# Patient Record
Sex: Female | Born: 1940
Health system: Southern US, Community
[De-identification: ages and names within clinical notes are randomized; demographics above are authoritative.]

## PROBLEM LIST (undated history)

## (undated) DIAGNOSIS — I209 Angina pectoris, unspecified: Secondary | ICD-10-CM

## (undated) DIAGNOSIS — C801 Malignant (primary) neoplasm, unspecified: Secondary | ICD-10-CM

## (undated) DIAGNOSIS — K436 Other and unspecified ventral hernia with obstruction, without gangrene: Secondary | ICD-10-CM

## (undated) DIAGNOSIS — B977 Papillomavirus as the cause of diseases classified elsewhere: Secondary | ICD-10-CM

## (undated) DIAGNOSIS — E785 Hyperlipidemia, unspecified: Secondary | ICD-10-CM

## (undated) DIAGNOSIS — M549 Dorsalgia, unspecified: Secondary | ICD-10-CM

## (undated) DIAGNOSIS — R7303 Prediabetes: Secondary | ICD-10-CM

## (undated) DIAGNOSIS — I10 Essential (primary) hypertension: Secondary | ICD-10-CM

## (undated) DIAGNOSIS — E119 Type 2 diabetes mellitus without complications: Secondary | ICD-10-CM

## (undated) DIAGNOSIS — IMO0001 Reserved for inherently not codable concepts without codable children: Secondary | ICD-10-CM

## (undated) DIAGNOSIS — H269 Unspecified cataract: Secondary | ICD-10-CM

## (undated) DIAGNOSIS — M25519 Pain in unspecified shoulder: Secondary | ICD-10-CM

## (undated) DIAGNOSIS — M8580 Other specified disorders of bone density and structure, unspecified site: Secondary | ICD-10-CM

## (undated) DIAGNOSIS — M1990 Unspecified osteoarthritis, unspecified site: Secondary | ICD-10-CM

## (undated) DIAGNOSIS — E669 Obesity, unspecified: Secondary | ICD-10-CM

## (undated) HISTORY — DX: Dorsalgia, unspecified: M54.9

## (undated) HISTORY — PX: EYE SURGERY: SHX253

## (undated) HISTORY — DX: Pain in unspecified shoulder: M25.519

## (undated) HISTORY — DX: Obesity, unspecified: E66.9

## (undated) HISTORY — PX: TONSILLECTOMY: SUR1361

## (undated) HISTORY — DX: Unspecified osteoarthritis, unspecified site: M19.90

## (undated) HISTORY — DX: Essential (primary) hypertension: I10

## (undated) HISTORY — PX: CATARACT EXTRACTION, BILATERAL: SHX1313

## (undated) HISTORY — DX: Other specified disorders of bone density and structure, unspecified site: M85.80

## (undated) HISTORY — DX: Hyperlipidemia, unspecified: E78.5

## (undated) HISTORY — PX: CORRECTION HAMMER TOE: SUR317

## (undated) HISTORY — PX: JOINT REPLACEMENT: SHX530

## (undated) HISTORY — DX: Type 2 diabetes mellitus without complications: E11.9

## (undated) HISTORY — DX: Papillomavirus as the cause of diseases classified elsewhere: B97.7

---

## 1994-07-26 DIAGNOSIS — B977 Papillomavirus as the cause of diseases classified elsewhere: Secondary | ICD-10-CM

## 1994-07-26 HISTORY — DX: Papillomavirus as the cause of diseases classified elsewhere: B97.7

## 2001-04-19 ENCOUNTER — Other Ambulatory Visit: Admission: RE | Admit: 2001-04-19 | Discharge: 2001-04-19 | Payer: Self-pay | Admitting: Family Medicine

## 2003-08-28 ENCOUNTER — Other Ambulatory Visit: Admission: RE | Admit: 2003-08-28 | Discharge: 2003-08-28 | Payer: Self-pay | Admitting: Family Medicine

## 2004-06-01 ENCOUNTER — Ambulatory Visit: Payer: Self-pay | Admitting: Unknown Physician Specialty

## 2004-06-01 HISTORY — PX: COLONOSCOPY: SHX174

## 2004-06-01 LAB — HM COLONOSCOPY: HM Colonoscopy: NORMAL

## 2004-08-27 ENCOUNTER — Ambulatory Visit: Payer: Self-pay | Admitting: Family Medicine

## 2004-10-13 ENCOUNTER — Other Ambulatory Visit: Admission: RE | Admit: 2004-10-13 | Discharge: 2004-10-13 | Payer: Self-pay | Admitting: Family Medicine

## 2004-10-13 ENCOUNTER — Ambulatory Visit: Payer: Self-pay | Admitting: Family Medicine

## 2004-12-09 ENCOUNTER — Ambulatory Visit: Payer: Self-pay | Admitting: Family Medicine

## 2005-03-16 ENCOUNTER — Ambulatory Visit: Payer: Self-pay | Admitting: Family Medicine

## 2005-04-01 ENCOUNTER — Ambulatory Visit: Payer: Self-pay | Admitting: Family Medicine

## 2005-05-13 ENCOUNTER — Ambulatory Visit: Payer: Self-pay | Admitting: Family Medicine

## 2005-10-01 ENCOUNTER — Ambulatory Visit: Payer: Self-pay | Admitting: Family Medicine

## 2005-11-18 ENCOUNTER — Other Ambulatory Visit: Admission: RE | Admit: 2005-11-18 | Discharge: 2005-11-18 | Payer: Self-pay | Admitting: Family Medicine

## 2005-11-18 ENCOUNTER — Encounter: Payer: Self-pay | Admitting: Family Medicine

## 2005-11-18 ENCOUNTER — Ambulatory Visit: Payer: Self-pay | Admitting: Family Medicine

## 2005-11-18 LAB — CONVERTED CEMR LAB: Pap Smear: NORMAL

## 2005-12-30 ENCOUNTER — Ambulatory Visit: Payer: Self-pay | Admitting: Family Medicine

## 2005-12-31 ENCOUNTER — Ambulatory Visit: Payer: Self-pay | Admitting: Family Medicine

## 2006-01-20 ENCOUNTER — Ambulatory Visit: Payer: Self-pay | Admitting: Family Medicine

## 2006-05-26 ENCOUNTER — Ambulatory Visit: Payer: Self-pay | Admitting: Family Medicine

## 2006-06-28 ENCOUNTER — Ambulatory Visit: Payer: Self-pay | Admitting: Family Medicine

## 2006-09-14 ENCOUNTER — Ambulatory Visit: Payer: Self-pay | Admitting: Family Medicine

## 2006-10-12 ENCOUNTER — Ambulatory Visit: Payer: Self-pay | Admitting: Family Medicine

## 2006-10-12 LAB — CONVERTED CEMR LAB
Cholesterol: 196 mg/dL (ref 0–200)
HDL: 42.3 mg/dL (ref >39.0)

## 2006-12-06 ENCOUNTER — Telehealth (INDEPENDENT_AMBULATORY_CARE_PROVIDER_SITE_OTHER): Payer: Self-pay | Admitting: *Deleted

## 2007-01-03 ENCOUNTER — Encounter: Payer: Self-pay | Admitting: Family Medicine

## 2007-01-03 DIAGNOSIS — N318 Other neuromuscular dysfunction of bladder: Secondary | ICD-10-CM

## 2007-01-03 DIAGNOSIS — E785 Hyperlipidemia, unspecified: Secondary | ICD-10-CM | POA: Insufficient documentation

## 2007-01-03 DIAGNOSIS — J301 Allergic rhinitis due to pollen: Secondary | ICD-10-CM

## 2007-01-03 DIAGNOSIS — J45909 Unspecified asthma, uncomplicated: Secondary | ICD-10-CM | POA: Insufficient documentation

## 2007-01-03 DIAGNOSIS — E669 Obesity, unspecified: Secondary | ICD-10-CM | POA: Insufficient documentation

## 2007-01-03 DIAGNOSIS — Z6835 Body mass index (BMI) 35.0-35.9, adult: Secondary | ICD-10-CM

## 2007-01-06 ENCOUNTER — Ambulatory Visit: Payer: Self-pay | Admitting: Family Medicine

## 2007-01-20 ENCOUNTER — Encounter: Payer: Self-pay | Admitting: Family Medicine

## 2007-03-07 ENCOUNTER — Ambulatory Visit: Payer: Self-pay | Admitting: Family Medicine

## 2007-03-07 DIAGNOSIS — M8580 Other specified disorders of bone density and structure, unspecified site: Secondary | ICD-10-CM

## 2007-03-10 ENCOUNTER — Ambulatory Visit: Payer: Self-pay | Admitting: Family Medicine

## 2007-03-23 ENCOUNTER — Ambulatory Visit: Payer: Self-pay | Admitting: Family Medicine

## 2007-03-23 ENCOUNTER — Encounter: Payer: Self-pay | Admitting: Family Medicine

## 2007-03-28 ENCOUNTER — Encounter (INDEPENDENT_AMBULATORY_CARE_PROVIDER_SITE_OTHER): Payer: Self-pay | Admitting: *Deleted

## 2007-04-27 ENCOUNTER — Ambulatory Visit: Payer: Self-pay | Admitting: Family Medicine

## 2007-05-02 LAB — CONVERTED CEMR LAB
AST: 19 units/L (ref 0–37)
CO2: 29 meq/L (ref 19–32)
Calcium: 9 mg/dL (ref 8.4–10.5)
Cholesterol: 184 mg/dL (ref 0–200)
Glucose, Bld: 103 mg/dL — ABNORMAL HIGH (ref 70–99)
HDL: 35.3 mg/dL — ABNORMAL LOW (ref >39.0)
Phosphorus: 3.7 mg/dL (ref 2.3–4.6)
Potassium: 4.5 meq/L (ref 3.5–5.1)
Total CHOL/HDL Ratio: 5.2
Triglycerides: 207 mg/dL (ref 0–149)

## 2007-06-13 ENCOUNTER — Ambulatory Visit: Payer: Self-pay | Admitting: Family Medicine

## 2007-06-13 DIAGNOSIS — M25549 Pain in joints of unspecified hand: Secondary | ICD-10-CM

## 2007-06-14 LAB — CONVERTED CEMR LAB
Basophils Absolute: 0 10*3/uL (ref 0.0–0.1)
Basophils Relative: 0.7 % (ref 0.0–1.0)
HCT: 41.9 % (ref 36.0–46.0)
Hemoglobin: 14.6 g/dL (ref 12.0–15.0)
MCHC: 34.9 g/dL (ref 30.0–36.0)
Monocytes Absolute: 0.6 10*3/uL (ref 0.2–0.7)
Neutrophils Relative %: 51.8 % (ref 43.0–77.0)
RDW: 12.2 % (ref 11.5–14.6)

## 2007-07-31 ENCOUNTER — Ambulatory Visit: Payer: Self-pay | Admitting: Internal Medicine

## 2007-08-01 ENCOUNTER — Encounter: Payer: Self-pay | Admitting: Family Medicine

## 2007-08-28 ENCOUNTER — Encounter: Payer: Self-pay | Admitting: Family Medicine

## 2007-10-02 ENCOUNTER — Encounter: Payer: Self-pay | Admitting: Family Medicine

## 2007-12-05 ENCOUNTER — Encounter: Payer: Self-pay | Admitting: Family Medicine

## 2007-12-13 ENCOUNTER — Encounter (INDEPENDENT_AMBULATORY_CARE_PROVIDER_SITE_OTHER): Payer: Self-pay | Admitting: *Deleted

## 2008-03-05 ENCOUNTER — Ambulatory Visit: Payer: Self-pay | Admitting: Family Medicine

## 2008-03-05 ENCOUNTER — Telehealth: Payer: Self-pay | Admitting: Family Medicine

## 2008-03-19 ENCOUNTER — Telehealth: Payer: Self-pay | Admitting: Family Medicine

## 2008-03-28 ENCOUNTER — Ambulatory Visit: Payer: Self-pay | Admitting: Family Medicine

## 2008-03-28 ENCOUNTER — Encounter: Payer: Self-pay | Admitting: Family Medicine

## 2008-04-16 ENCOUNTER — Telehealth: Payer: Self-pay | Admitting: Family Medicine

## 2008-05-06 ENCOUNTER — Ambulatory Visit: Payer: Self-pay | Admitting: Family Medicine

## 2008-05-07 ENCOUNTER — Encounter: Payer: Self-pay | Admitting: Family Medicine

## 2008-05-07 LAB — CONVERTED CEMR LAB
ALT: 15 units/L (ref 0–35)
Basophils Absolute: 0 10*3/uL (ref 0.0–0.1)
CO2: 30 meq/L (ref 19–32)
Chloride: 110 meq/L (ref 96–112)
Cholesterol: 186 mg/dL (ref 0–200)
GFR calc Af Amer: 92 mL/min
GFR calc non Af Amer: 76 mL/min
HDL: 39.4 mg/dL (ref >39.0)
LDL Cholesterol: 116 mg/dL — ABNORMAL HIGH (ref 0–99)
Lymphocytes Relative: 35.6 % (ref 12.0–46.0)
MCHC: 33.9 g/dL (ref 30.0–36.0)
Neutrophils Relative %: 50.2 % (ref 43.0–77.0)
Phosphorus: 3.3 mg/dL (ref 2.3–4.6)
Platelets: 236 10*3/uL (ref 150–400)
RDW: 12.7 % (ref 11.5–14.6)
Sodium: 145 meq/L (ref 135–145)
TSH: 1.95 microintl units/mL (ref 0.35–5.50)
Triglycerides: 152 mg/dL — ABNORMAL HIGH (ref 0–149)
VLDL: 30 mg/dL (ref 0–40)

## 2008-05-13 ENCOUNTER — Ambulatory Visit: Payer: Self-pay | Admitting: Family Medicine

## 2008-05-13 DIAGNOSIS — R030 Elevated blood-pressure reading, without diagnosis of hypertension: Secondary | ICD-10-CM

## 2008-05-13 DIAGNOSIS — M069 Rheumatoid arthritis, unspecified: Secondary | ICD-10-CM

## 2008-05-13 DIAGNOSIS — M059 Rheumatoid arthritis with rheumatoid factor, unspecified: Secondary | ICD-10-CM | POA: Insufficient documentation

## 2008-05-23 ENCOUNTER — Ambulatory Visit: Payer: Self-pay | Admitting: Family Medicine

## 2008-06-25 LAB — HM DEXA SCAN

## 2008-07-09 ENCOUNTER — Encounter: Payer: Self-pay | Admitting: Family Medicine

## 2008-07-17 ENCOUNTER — Ambulatory Visit: Payer: Self-pay | Admitting: Family Medicine

## 2008-08-05 ENCOUNTER — Ambulatory Visit: Payer: Self-pay | Admitting: Family Medicine

## 2008-09-04 ENCOUNTER — Ambulatory Visit: Payer: Self-pay | Admitting: Family Medicine

## 2008-09-05 ENCOUNTER — Encounter: Payer: Self-pay | Admitting: Family Medicine

## 2008-09-24 ENCOUNTER — Encounter: Payer: Self-pay | Admitting: Family Medicine

## 2008-10-29 ENCOUNTER — Telehealth (INDEPENDENT_AMBULATORY_CARE_PROVIDER_SITE_OTHER): Payer: Self-pay | Admitting: *Deleted

## 2008-11-01 ENCOUNTER — Encounter: Payer: Self-pay | Admitting: Family Medicine

## 2008-11-11 ENCOUNTER — Ambulatory Visit: Payer: Self-pay | Admitting: Family Medicine

## 2008-11-12 LAB — CONVERTED CEMR LAB
ALT: 12 units/L (ref 0–35)
HDL: 37 mg/dL — ABNORMAL LOW (ref >39.00)
LDL Cholesterol: 100 mg/dL — ABNORMAL HIGH (ref 0–99)
Total CHOL/HDL Ratio: 5

## 2008-12-12 ENCOUNTER — Ambulatory Visit: Payer: Self-pay | Admitting: Family Medicine

## 2008-12-16 ENCOUNTER — Telehealth: Payer: Self-pay | Admitting: Family Medicine

## 2009-03-07 ENCOUNTER — Telehealth: Payer: Self-pay | Admitting: Family Medicine

## 2009-03-11 ENCOUNTER — Ambulatory Visit: Payer: Self-pay | Admitting: Family Medicine

## 2009-05-06 ENCOUNTER — Encounter: Payer: Self-pay | Admitting: Family Medicine

## 2009-05-12 ENCOUNTER — Ambulatory Visit: Payer: Self-pay | Admitting: Family Medicine

## 2009-05-13 LAB — CONVERTED CEMR LAB
AST: 19 units/L (ref 0–37)
Albumin: 3.4 g/dL — ABNORMAL LOW (ref 3.5–5.2)
Creatinine, Ser: 0.9 mg/dL (ref 0.4–1.2)
Glucose, Bld: 114 mg/dL — ABNORMAL HIGH (ref 70–99)
LDL Cholesterol: 109 mg/dL — ABNORMAL HIGH (ref 0–99)
Phosphorus: 3.3 mg/dL (ref 2.3–4.6)
Potassium: 4.5 meq/L (ref 3.5–5.1)
Sodium: 145 meq/L (ref 135–145)
Total CHOL/HDL Ratio: 5
Triglycerides: 188 mg/dL — ABNORMAL HIGH (ref 0.0–149.0)

## 2009-05-14 ENCOUNTER — Other Ambulatory Visit: Admission: RE | Admit: 2009-05-14 | Discharge: 2009-05-14 | Payer: Self-pay | Admitting: Family Medicine

## 2009-05-14 ENCOUNTER — Ambulatory Visit: Payer: Self-pay | Admitting: Family Medicine

## 2009-05-14 ENCOUNTER — Encounter: Payer: Self-pay | Admitting: Family Medicine

## 2009-05-14 DIAGNOSIS — R7303 Prediabetes: Secondary | ICD-10-CM | POA: Insufficient documentation

## 2009-05-14 LAB — CONVERTED CEMR LAB
Cholesterol, target level: 200 mg/dL
LDL Goal: 130 mg/dL

## 2009-05-20 ENCOUNTER — Encounter (INDEPENDENT_AMBULATORY_CARE_PROVIDER_SITE_OTHER): Payer: Self-pay | Admitting: *Deleted

## 2009-06-11 ENCOUNTER — Ambulatory Visit: Payer: Self-pay | Admitting: Family Medicine

## 2009-08-12 ENCOUNTER — Encounter (INDEPENDENT_AMBULATORY_CARE_PROVIDER_SITE_OTHER): Payer: Self-pay | Admitting: *Deleted

## 2009-08-13 ENCOUNTER — Ambulatory Visit: Payer: Self-pay | Admitting: Family Medicine

## 2009-08-14 LAB — CONVERTED CEMR LAB: Hgb A1c MFr Bld: 5.7 % (ref 4.6–6.5)

## 2009-12-31 ENCOUNTER — Encounter: Payer: Self-pay | Admitting: Family Medicine

## 2010-05-05 ENCOUNTER — Ambulatory Visit: Payer: Self-pay | Admitting: Family Medicine

## 2010-05-29 ENCOUNTER — Telehealth (INDEPENDENT_AMBULATORY_CARE_PROVIDER_SITE_OTHER): Payer: Self-pay | Admitting: *Deleted

## 2010-06-01 ENCOUNTER — Ambulatory Visit: Payer: Self-pay | Admitting: Family Medicine

## 2010-06-01 LAB — CONVERTED CEMR LAB
ALT: 16 units/L (ref 0–35)
AST: 19 units/L (ref 0–37)
Basophils Absolute: 0.1 10*3/uL (ref 0.0–0.1)
CO2: 29 meq/L (ref 19–32)
Chloride: 109 meq/L (ref 96–112)
Eosinophils Relative: 1.9 % (ref 0.0–5.0)
GFR calc non Af Amer: 69.26 mL/min (ref >60)
Hemoglobin: 14.5 g/dL (ref 12.0–15.0)
Lymphocytes Relative: 34.7 % (ref 12.0–46.0)
Monocytes Relative: 9.1 % (ref 3.0–12.0)
Neutro Abs: 2.8 10*3/uL (ref 1.4–7.7)
Phosphorus: 3.4 mg/dL (ref 2.3–4.6)
Potassium: 4.8 meq/L (ref 3.5–5.1)
RBC: 4.43 M/uL (ref 3.87–5.11)
RDW: 13.4 % (ref 11.5–14.6)
Sodium: 143 meq/L (ref 135–145)
WBC: 5.2 10*3/uL (ref 4.5–10.5)

## 2010-06-02 ENCOUNTER — Encounter (INDEPENDENT_AMBULATORY_CARE_PROVIDER_SITE_OTHER): Payer: Self-pay | Admitting: *Deleted

## 2010-06-02 LAB — CONVERTED CEMR LAB: Vit D, 25-Hydroxy: 45 ng/mL (ref 30–89)

## 2010-06-03 ENCOUNTER — Ambulatory Visit: Payer: Self-pay | Admitting: Family Medicine

## 2010-06-03 DIAGNOSIS — M25569 Pain in unspecified knee: Secondary | ICD-10-CM | POA: Insufficient documentation

## 2010-06-30 ENCOUNTER — Encounter: Payer: Self-pay | Admitting: Family Medicine

## 2010-07-03 ENCOUNTER — Ambulatory Visit: Payer: Self-pay | Admitting: Internal Medicine

## 2010-07-08 ENCOUNTER — Encounter: Payer: Self-pay | Admitting: Family Medicine

## 2010-07-08 ENCOUNTER — Ambulatory Visit: Payer: Self-pay | Admitting: Family Medicine

## 2010-07-09 ENCOUNTER — Encounter: Payer: Self-pay | Admitting: Family Medicine

## 2010-07-14 ENCOUNTER — Ambulatory Visit: Payer: Self-pay | Admitting: Internal Medicine

## 2010-07-26 LAB — HM DEXA SCAN

## 2010-07-31 ENCOUNTER — Encounter: Payer: Self-pay | Admitting: Family Medicine

## 2010-08-25 NOTE — Letter (Signed)
Summary: Rheumatology/[facility]  Rheumatology/[facility]   Imported By: Lester Mulford 01/10/2010 10:26:54  _____________________________________________________________________  External Attachment:    Type:   Image     Comment:   External Document

## 2010-08-25 NOTE — Letter (Signed)
Summary: [place] No Show Letter  [place] [facility]  [address] [place], [place] [address]   Phone: [phone]  Fax: [phone]    08/12/2009 MRN: [id number]  Cheryl Joseph [address] [place], [place]  [address]   Dear [name],   Our records indicate that you missed your scheduled appointment with ________Lab_____________ on ____1/18/11________.  Please contact this office to reschedule your appointment as soon as possible.  It is important that you keep your scheduled appointments with your physician, so we can provide you the best care possible.  Please be advised that there may be a charge for "no show" appointments.    Sincerely,    at [facility]

## 2010-08-25 NOTE — Miscellaneous (Signed)
Summary: med list update  Clinical Lists Changes  Medications: Changed medication from OXYTROL  PTTW (OXYBUTYNIN PTTW) apply one patch topically twice a week to OXYTROL 3.9 MG/24HR PTTW (OXYBUTYNIN) apply one patch topically twice a week     Prior Medications: SINGULAIR 10 MG TABS (MONTELUKAST SODIUM) take one by mouth dialy CENTRUM SILVER  TABS (MULTIPLE VITAMINS-MINERALS) take one by mouth daily CALTRATE 600  TABS (CALCIUM CARBONATE TABS) take by mouth as directed CLARITIN 10 MG TABS (LORATADINE) take by mouth as directed prn OXYTROL 3.9 MG/24HR PTTW (OXYBUTYNIN) apply one patch topically twice a week PROVENTIL 90 MCG/ACT AERS (ALBUTEROL) use as directed 2 puffs up to every 4 hours as needed and also before exercise ZOCOR 40 MG TABS (SIMVASTATIN) take one and one half by mouth daily MECLIZINE HCL 25 MG TABS (MECLIZINE HCL) Take 1/2 to 1 tablet by mouth four times a day as needed FISH OIL CAPS () daily Current Allergies: * DETROL * VYTORIN

## 2010-08-25 NOTE — Assessment & Plan Note (Signed)
Summary: shingles?/bir   Vital Signs:  Patient Profile:   70 Years Old Female Height:     62.5 inches Weight:      177 pounds Temp:     97.8 degrees F oral Pulse rate:   64 / minute Pulse rhythm:   regular BP sitting:   124 / 78  (right arm) Cuff size:   large  Vitals Entered By: Lowella Petties (March 10, 2007 4:27 PM)               Chief Complaint:  Rash right abdomen.  History of Present Illness: started ? rash on R abd (like a streak)- does not hurt/jusst itches- started on wed no fever, chills or other symptoms has had poison oak on arms which is getting better   Current Allergies: * DETROL * VYTORIN     Review of Systems      See HPI  General      Denies chills, fatigue, and fever.  Eyes      Denies eye irritation.  ENT      Denies nasal congestion and sore throat.  CV      Denies chest pain or discomfort.  Resp      Denies cough, shortness of breath, and wheezing.  MS      Denies joint pain.  Derm      Complains of rash.      Denies insect bite(s).  Allergy      Complains of seasonal allergies.   Physical Exam  General:     overwt but well appearing Mouth:     pharynx pink and moist.   Neck:     No deformities, masses, or tenderness noted.no cervical lymphadenopathy.   Lungs:     Normal respiratory effort, chest expands symmetrically. Lungs are clear to auscultation, no crackles or wheezes. Abdomen:     Bowel sounds positive,abdomen soft and non-tender without masses, organomegaly or hernias noted. Skin:     rash on R side of abdomen- in circuferential pattern with sharply demarcated line of erythema no papules or vesicles noted and no satellite lesions  generally dry skin on back Inguinal Nodes:     No significant adenopathy Psych:     nl affect    Impression & Recommendations:  Problem # 1:  FUNGAL DERMATITIS (ICD-111.9) rash resembles fungal dermatitis- sharp demarcation may be from skin fold no vesicles or papules  to indicate zoster (and no pain)- but pt inst to call if any of those develop will try nystatin cream and update Her updated medication list for this problem includes:    Nystatin 100000 Unit/gm Crea (Nystatin) .Marland Kitchen... Apply to affected area bid   Complete Medication List: 1)  Singulair 10 Mg Tabs (Montelukast sodium) .... Take one by mouth dialy 2)  Centrum Silver Tabs (Multiple vitamins-minerals) .... Take one by mouth daily 3)  Caltrate 600 Tabs (Calcium carbonate tabs) .... Take by mouth as directed 4)  Aspirin 81 Mg Tbec (Aspirin) .... Take by mouth as directed 5)  Claritin 10 Mg Tabs (Loratadine) .... Take by mouth as directed prn 6)  Oxytrol Pttw (Oxybutynin pttw) .... Use as directed 7)  Proventil 90 Mcg/act Aers (Albuterol) .... Use as directed prn 8)  Zocor 40 Mg Tabs (Simvastatin) .... Take one and one half by mouth daily 9)  Meclizine Hcl 25 Mg Tabs (Meclizine hcl) .... Take 1/2 to 1 tablet by mouth four times a day 10)  Nystatin 100000 Unit/gm Crea (Nystatin) .... Apply to  affected area bid   Patient Instructions: 1)  use nystatin cream twice daily- and let me know if not improved in a week or so 2)  keep area as clean and dry as possible 3)  cornstarch is good to help keep skin dry 4)  if pain or blisters develop, let me know    Prescriptions: NYSTATIN 100000 UNIT/GM  CREA (NYSTATIN) apply to affected area bid  #1 med tube x 1   Entered and Authorized by:   Judith Part MD   Signed by:   Judith Part MD on 03/10/2007   Method used:   Print then Give to Patient   RxID:   [id number]

## 2010-08-25 NOTE — Progress Notes (Signed)
----   Converted from flag ---- ---- 05/28/2010 5:14 PM, Colon Flattery Tower MD wrote: please check lipid/ast/alt/renal/ tsh/ cbc with diff and vit D level  733.0, 272, 401.1 thanks   ---- 05/28/2010 7:32 AM, Liane Comber CMA (AAMA) wrote: Lab orders please! Good Morning! This pt is scheduled for cpx labs Monday, which labs to draw and dx codes to use? Thanks Tasha ------------------------------

## 2010-08-25 NOTE — Assessment & Plan Note (Signed)
Summary: CPX/CLE   Vital Signs:  Patient profile:   70 year old female Height:      63 inches Weight:      195.50 pounds BMI:     34.76 Temp:     97.9 degrees F oral Pulse rate:   64 / minute Pulse rhythm:   regular BP sitting:   128 / 80  (left arm) Cuff size:   large  Vitals Entered By: Lewanda Rife LPN (June 03, 2010 10:01 AM) CC: check up / multiple chronic issues    History of Present Illness: here for check up of multiple chronic medical problems  feels pretty good overall   did something to her knee 6 weeks ago is feeling better  is using icy hot which helps  no particular trauma pain is lateral -- and ok walking / worse at night and when still is a little swollen  hardest to get in and out of the car    wt is up 2 lb pretty stable at home -- is trying hard to loose weight  does a 2 mile walking dvd -- not since she hurt her knee  is eating healthy - low sugar and low fat   is interested in wt watchers again  too much snacking   128/80 bp -- good   last pap nl 10/10-- last 3 have been normal  no symptoms or problems  remote hx of hpv / colp in 96   lipids --up a bit in trig 231- good LDL 110  osteopenia - dexa 09 dec -- nl D level is good  is taking calcium - slacked off on D  ca and D  mam 9/09 self exam   glucose 108 last aic under 6  Td 2010 flu up to date ptx 06 up to date  had shingles shot also in 09     Allergies: 1)  * Detrol 2)  * Vytorin  Past History:  Past Medical History: Last updated: 08/05/2008 1996 HPV with colposcopy (all neg paps since) Hyperlipidemia Asthma (symptom is cough) osteopenia obesity RA- hands (seronegative) back/ shoulder pain  rheum-- Dr Gavin Potters chiropractor-- Dr Jonnie Finner  Past Surgical History: Last updated: 07/13/2008 Tonsillectomy Spirometry- normal (01/2001) Dexa- (2001)     improved (2004) Colonoscopy- diverticulosis, hemorrhoids (05/2004) (12/09) dexa - worse osteopenia   Family  History: Last updated: 07/17/2008 Father: heart problems, HTN, died from lung cancer- smoker Mother: DM, elevated chol, heart problems, HTN, obesity Siblings:  brother DM brother alcohol mother and sisters had kyphosis- ? if could have had OP  Social History: Last updated: 05/13/2008 Marital Status: widowed Children:3 daughters, 1 with DM Works at  [employer] Never Smoked no alcohol   Risk Factors: Smoking Status: never (07/31/2007)  Review of Systems General:  Denies fatigue, loss of appetite, and malaise. Eyes:  Denies blurring and eye irritation. CV:  Denies chest pain or discomfort, lightheadness, and palpitations. Resp:  Denies cough, shortness of breath, and wheezing. GI:  Denies abdominal pain, change in bowel habits, and indigestion. GU:  Denies dysuria and urinary frequency. MS:  Complains of joint pain; denies joint redness, joint swelling, and muscle weakness. Derm:  Denies itching, lesion(s), poor wound healing, and rash. Neuro:  Denies numbness and tingling. Psych:  Denies anxiety and depression. Endo:  Denies excessive thirst and excessive urination. Heme:  Denies abnormal bruising and bleeding.  Physical Exam  General:  overweight but generally well appearing  Head:  normocephalic, atraumatic, and no  abnormalities observed.   Eyes:  vision grossly intact, pupils equal, pupils round, and pupils reactive to light.  no conjunctival pallor, injection or icterus  Ears:  R ear normal and L ear normal.   Nose:  no nasal discharge.   Mouth:  pharynx pink and moist.   Neck:  supple with full rom and no masses or thyromegally, no JVD or carotid bruit  Chest Wall:  No deformities, masses, or tenderness noted. Breasts:  No mass, nodules, thickening, tenderness, bulging, retraction, inflamation, nipple discharge or skin changes noted.   Lungs:  Normal respiratory effort, chest expands symmetrically. Lungs are clear to auscultation, no crackles or  wheezes. Heart:  Normal rate and regular rhythm. S1 and S2 normal without gallop, murmur, click, rub or other extra sounds. Abdomen:  Bowel sounds positive,abdomen soft and non-tender without masses, organomegaly or hernias noted. no renal bruits  Msk:  No deformity or scoliosis noted of thoracic or lumbar spine.  poor rom R knee with some lateral joint line tenderness  Pulses:  R and L carotid,radial,femoral,dorsalis pedis and posterior tibial pulses are full and equal bilaterally Extremities:  No clubbing, cyanosis, edema, or deformity noted with normal full range of motion of all joints.   Neurologic:  sensation intact to light touch, gait normal, and DTRs symmetrical and normal.   Skin:  Intact without suspicious lesions or rashes Cervical Nodes:  No lymphadenopathy noted Axillary Nodes:  No palpable lymphadenopathy Inguinal Nodes:  No significant adenopathy Psych:  normal affect, talkative and pleasant    Impression & Recommendations:  Problem # 1:  HYPERGLYCEMIA, FASTING (ICD-790.29) Assessment Unchanged  disc healthy diet (low simple sugar/ choose complex carbs/ low sat fat) diet and exercise in detail  will continue to follow   Labs Reviewed: Creat: 0.9 (06/01/2010)     Orders: Prescription Created Electronically [phone])  Problem # 2:  HYPERLIPIDEMIA (ICD-272.4) Assessment: Deteriorated  trig up - so disc low fat and low sugar diet  exercise when able  refil zocor  rev labs in detail with pt  Her updated medication list for this problem includes:    Zocor 40 Mg Tabs (Simvastatin) .Marland Kitchen... Take one and one half by mouth daily  Labs Reviewed: SGOT: 19 (06/01/2010)   SGPT: 16 (06/01/2010)  Lipid Goals: Chol Goal: 200 (05/14/2009)   HDL Goal: 40 (05/14/2009)   LDL Goal: 130 (05/14/2009)   TG Goal: 150 (05/14/2009)  Prior 10 Yr Risk Heart Disease: 17 % (05/14/2009)   HDL:39.50 (06/01/2010), 33.00 (05/12/2009)  LDL:109 (05/12/2009), 100 (11/11/2008)  Chol:185 (06/01/2010),  180 (05/12/2009)  Trig:231.0 (06/01/2010), 188.0 (05/12/2009)  Problem # 3:  OTHER SCREENING MAMMOGRAM (ICD-V76.12) Assessment: Comment Only annual mammogram scheduled adv pt to continue regular self breast exams non remarkable breast exam today  Orders: Radiology Referral (Radiology)  Problem # 4:  OSTEOPENIA (ICD-733.90) rev ca and D D level ok sched dexa in 2012 Her updated medication list for this problem includes:    Caltrate 600 Tabs (Calcium carbonate tabs) .Marland Kitchen... Take by mouth as directed  Orders: Radiology Referral (Radiology) Prescription Created Electronically [phone])  Problem # 5:  KNEE PAIN, RIGHT (ICD-719.46) Assessment: New  lateral with sweling ? injury ref to Dr Patsy Lager  Orders: Prescription Created Electronically [phone])  Complete Medication List: 1)  Singulair 10 Mg Tabs (Montelukast sodium) .... Take one by mouth dialy 2)  Centrum Silver Tabs (Multiple vitamins-minerals) .... Take one by mouth daily 3)  Caltrate 600 Tabs (Calcium carbonate tabs) .... Take by mouth as directed  4)  Claritin 10 Mg Tabs (Loratadine) .... Take by mouth as directed as needed 5)  Oxytrol 3.9 Mg/24hr Pttw (Oxybutynin) .... Apply one patch topically twice a week 6)  Proair Hfa 108 (90 Base) Mcg/act Aers (Albuterol sulfate) .... 2 puffs up to every 4 hours as needed 7)  Zocor 40 Mg Tabs (Simvastatin) .... Take one and one half by mouth daily 8)  Meclizine Hcl 25 Mg Tabs (Meclizine hcl) .... Take 1/2 to 1 tablet by mouth four times a day as needed 9)  Fish Oil Caps  .... Two capsules by Galen Manila daily  Patient Instructions: 1)  the current recommendation for calcium intake is 1200-1500 mg daily with 1000 IU of vitamin D  2)  we will schedule mammogram at check out and bone density  3)  since oxytrol patch is no longer affordible -- call your insurance to check on price of enablex and vesicare (they are newer oral medicines with less side effects )  4)  we will make appt with Dr  Patsy Lager for knee pain  Prescriptions: ZOCOR 40 MG TABS (SIMVASTATIN) take one and one half by mouth daily  #30 x 11   Entered and Authorized by:   Judith Part MD   Signed by:   Judith Part MD on 06/03/2010   Method used:   Electronically to        [facility] [phone]* (retail)       [address] [place], [place]  [address]       Ph: [phone]       Fax: [phone]   RxID:   [id number] PROAIR HFA 108 (90 BASE) MCG/ACT AERS (ALBUTEROL SULFATE) 2 puffs up to every 4 hours as needed  #1 mdi x 11   Entered and Authorized by:   Judith Part MD   Signed by:   Judith Part MD on 06/03/2010   Method used:   Electronically to        [facility] [phone]* (retail)       [address] [place], [place]  [address]       Ph: [phone]       Fax: [phone]   RxID:   [id number] SINGULAIR 10 MG TABS (MONTELUKAST SODIUM) take one by mouth dialy  #30 x 11   Entered and Authorized by:   Judith Part MD   Signed by:   Judith Part MD on 06/03/2010   Method used:   Electronically to        [facility] [phone]* (retail)       [address] [place], [place]  [address]       Ph: [phone]       Fax: [phone]   RxID:   [id number]    Orders Added: 1)  Radiology Referral [Radiology] 2)  Radiology Referral [Radiology] 3)  Prescription Created Electronically [G8553] 4)  Est. Patient Level IV [62694]    Current Allergies (reviewed today): * DETROL * VYTORIN

## 2010-08-25 NOTE — Assessment & Plan Note (Signed)
Summary: CONGESTION/DLO   Vital Signs:  Patient profile:   70 year old female Height:      63 inches Weight:      191.25 pounds BMI:     34.00 Temp:     97.9 degrees F oral Pulse rate:   66 / minute Pulse rhythm:   regular BP sitting:   130 / 90  (left arm) Cuff size:   large  Vitals Entered By: Melody Comas (July 03, 2010 2:50 PM) CC: cough, congestion    History of Present Illness: CC: congestion  4d h/o congestion and cough.  Took mucous relief q4 hours which caused diarrhea.  stopped yesterday and feeling worse congestion, "deeper".  + mouth extremely dry.  + RN today.  + head congestion/tightness.  Dry cough.  + sinus pressure.  No fevers/chills.  No abd pain, n/v, rashes, myalgias/arthralgias.  No ST, sneezing.  Went back to work today.  Today BP a bit high.  + friend with cold.  No smokers at home.  + h/o asthma, doesn't bother her.  Takes singulair daily and rarely albuterol.  pt states very sensitive to meds  requests change of oxytrol to vesicare for urinary incontinence.  Current Medications (verified): 1)  Singulair 10 Mg Tabs (Montelukast Sodium) .... Take One By Mouth Dialy 2)  Centrum Silver  Tabs (Multiple Vitamins-Minerals) .... Take One By Mouth Daily 3)  Caltrate 600  Tabs (Calcium Carbonate Tabs) .... Take By Mouth As Directed 4)  Claritin 10 Mg Tabs (Loratadine) .... Take By Mouth As Directed As Needed 5)  Oxytrol 3.9 Mg/24hr Pttw (Oxybutynin) .... Apply One Patch Topically Twice A Week 6)  Proair Hfa 108 (90 Base) Mcg/act Aers (Albuterol Sulfate) .... 2 Puffs Up To Every 4 Hours As Needed 7)  Zocor 40 Mg Tabs (Simvastatin) .... Take One and One Half By Mouth Daily 8)  Meclizine Hcl 25 Mg Tabs (Meclizine Hcl) .... Take 1/2 To 1 Tablet By Mouth Four Times A Day As Needed 9)  Fish Oil Caps .... Two Capsules By Galen Manila Daily  Allergies: 1)  * Detrol 2)  * Vytorin  Past History:  Past Medical History: Last updated: 08/05/2008 1996 HPV with  colposcopy (all neg paps since) Hyperlipidemia Asthma (symptom is cough) osteopenia obesity RA- hands (seronegative) back/ shoulder pain  rheum-- Dr Gavin Potters chiropractor-- Dr Jonnie Finner  Social History: Last updated: 05/13/2008 Marital Status: widowed Children:3 daughters, 1 with DM Works at  [employer] Never Smoked no alcohol   Review of Systems       per HPI  Physical Exam  General:  overweight but generally well appearing  Head:  normocephalic, atraumatic, and no abnormalities observed.  no sinus tenderness Eyes:  vision grossly intact, pupils equal, pupils round, and pupils reactive to light.  no conjunctival pallor, injection or icterus  Ears:  R ear normal and L ear normal.   Nose:  no nasal discharge.   Mouth:  slight pharyngeal erythema, no exudates Neck:  supple with full rom and no masses or thyromegally, no JVD or carotid bruit  Lungs:  good air movement.  + insp/exp wheezing.  normal wob. Heart:  Normal rate and regular rhythm. S1 and S2 normal without gallop, murmur, click, rub or other extra sounds. Pulses:  2+ rad pulses Extremities:  no pedal edema Skin:  Intact without suspicious lesions or rashes   Impression & Recommendations:  Problem # 1:  ASTHMA, WITH ACUTE EXACERBATION (ZOX-096.04) Assessment New treat with scheduling albuterol  as well as taper of steroids.  pt sensitive to meds so start low, do taper.  steroid precautions discussed.  Her updated medication list for this problem includes:    Singulair 10 Mg Tabs (Montelukast sodium) .Marland Kitchen... Take one by mouth dialy    Proair Hfa 108 (90 Base) Mcg/act Aers (Albuterol sulfate) .Marland Kitchen... 2 puffs up to every 4 hours as needed    Prednisone 20 Mg Tabs (Prednisone) .Marland Kitchen... 2 pills x 3 days then 1 pill daily for 4 days  Complete Medication List: 1)  Singulair 10 Mg Tabs (Montelukast sodium) .... Take one by mouth dialy 2)  Centrum Silver Tabs (Multiple vitamins-minerals) .... Take one by mouth  daily 3)  Caltrate 600 Tabs (Calcium carbonate tabs) .... Take by mouth as directed 4)  Claritin 10 Mg Tabs (Loratadine) .... Take by mouth as directed as needed 5)  Vesicare 5 Mg Tabs (Solifenacin succinate) .... One daily 6)  Proair Hfa 108 (90 Base) Mcg/act Aers (Albuterol sulfate) .... 2 puffs up to every 4 hours as needed 7)  Zocor 40 Mg Tabs (Simvastatin) .... Take one and one half by mouth daily 8)  Meclizine Hcl 25 Mg Tabs (Meclizine hcl) .... Take 1/2 to 1 tablet by mouth four times a day as needed 9)  Fish Oil Caps  .... Two capsules by outh daily 10)  Prednisone 20 Mg Tabs (Prednisone) .... 2 pills x 3 days then 1 pill daily for 4 days  Patient Instructions: 1)  You have asthmatic bronchitis. 2)  Treat with course of steroids for next week 3)  Schedule albuterol inhaler every 4-6 hours for next 2-3 days then as needed. 4)  If any fevers/chills, worsening breathing or coughing, those may be reasons to return to be checked. 5)  Let us know how yo udo. 6)  Good to meet you today. Prescriptions: VESICARE 5 MG TABS (SOLIFENACIN SUCCINATE) one daily  #30 x 1   Entered and Authorized by:   Eustaquio Boyden  MD   Signed by:   Eustaquio Boyden  MD on 07/03/2010   Method used:   Electronically to        [facility] [phone]* (retail)       [address] [place], [place]  [address]       Ph: [phone]       Fax: [phone]   RxID:   [id number] PREDNISONE 20 MG TABS (PREDNISONE) 2 pills x 3 days then 1 pill daily for 4 days  #10 x 0   Entered and Authorized by:   Eustaquio Boyden  MD   Signed by:   Eustaquio Boyden  MD on 07/03/2010   Method used:   Electronically to        [facility] [phone]* (retail)       [address] [place], [place]  [address]       Ph: [phone]       Fax: [phone]   RxID:   [id number]    Orders Added: 1)  Est. Patient Level III [30160]    Current Allergies (reviewed today): * DETROL * VYTORIN

## 2010-08-25 NOTE — Assessment & Plan Note (Signed)
Summary: FLU SHOT/CLE  Nurse Visit   Allergies: 1)  * Detrol 2)  * Vytorin  Orders Added: 1)  Flu Vaccine 98yrs + MEDICARE PATIENTS [Q2039] 2)  Administration Flu vaccine - MCR [G0008]  Flu Vaccine Consent Questions     Do you have a history of severe allergic reactions to this vaccine? no    Any prior history of allergic reactions to egg and/or gelatin? no    Do you have a sensitivity to the preservative Thimersol? no    Do you have a past history of Guillan-Barre Syndrome? no    Do you currently have an acute febrile illness? no    Have you ever had a severe reaction to latex? no    Vaccine information given and explained to patient? yes    Are you currently pregnant? no    Lot Number:[id number]   Exp Date:01/23/2011   Site Given  Left Deltoid IM

## 2010-08-27 NOTE — Assessment & Plan Note (Signed)
Summary: STILL NOT ANY BETTER/DLO   Vital Signs:  Patient profile:   70 year old female Weight:      198.25 pounds Temp:     98.4 degrees F oral Pulse rate:   68 / minute Pulse rhythm:   regular BP sitting:   126 / 80  (left arm) Cuff size:   large  Vitals Entered By: Selena Batten Dance CMA Duncan Dull) (July 14, 2010 3:54 PM) CC: Recheck   History of Present Illness: CC: recheck  Last week had stomach virus, diarrhea.  pushing fluids as much as she can.   No vomiitng or nausea.  Seen 12/9 with dx asthmatic bronchitis, treated with prednisone taper x 10 days.  Prednisone did help with cough/wheezing.  wheezing much better.  Yesterday bad day - went to work, when came home had stomach cramps and diarrhea.  Now feeling tired.  This am still with some cramping.  No more fevers/chills, no new rashes.  In AMs noticing eyes blood shot then clear up.  Cough decreased still present but improving.  Throat felt swollen, now better.  did have sick contacts at school  Current Medications (verified): 1)  Singulair 10 Mg Tabs (Montelukast Sodium) .... Take One By Mouth Dialy 2)  Centrum Silver  Tabs (Multiple Vitamins-Minerals) .... Take One By Mouth Daily 3)  Caltrate 600  Tabs (Calcium Carbonate Tabs) .... Take By Mouth As Directed 4)  Claritin 10 Mg Tabs (Loratadine) .... Take By Mouth As Directed As Needed 5)  Vesicare 5 Mg Tabs (Solifenacin Succinate) .... One Daily 6)  Proair Hfa 108 (90 Base) Mcg/act Aers (Albuterol Sulfate) .... 2 Puffs Up To Every 4 Hours As Needed 7)  Zocor 40 Mg Tabs (Simvastatin) .... Take One and One Half By Mouth Daily 8)  Meclizine Hcl 25 Mg Tabs (Meclizine Hcl) .... Take 1/2 To 1 Tablet By Mouth Four Times A Day As Needed 9)  Fish Oil Caps .... Two Capsules By Galen Manila Daily  Allergies: 1)  * Detrol 2)  * Vytorin  Past History:  Past Medical History: Last updated: 08/05/2008 1996 HPV with colposcopy (all neg paps since) Hyperlipidemia Asthma (symptom is  cough) osteopenia obesity RA- hands (seronegative) back/ shoulder pain  rheum-- Dr Gavin Potters chiropractor-- Dr Jonnie Finner  Social History: Last updated: 05/13/2008 Marital Status: widowed Children:3 daughters, 1 with DM Works at  [employer] Never Smoked no alcohol   Review of Systems       per HPI  Physical Exam  General:  overweight but generally well appearing  Head:  normocephalic, atraumatic, and no abnormalities observed.  no sinus tenderness Eyes:  vision grossly intact, pupils equal, pupils round, and pupils reactive to light.  no conjunctival pallor, injection or icterus  Ears:  R ear normal and L ear normal.   Nose:  no nasal discharge.   Mouth:  slight pharyngeal erythema, no exudates Neck:  supple with full rom and no masses or thyromegally, no JVD or carotid bruit  Lungs:  good air movement.   normal wob.  no more wheezing Heart:  Normal rate and regular rhythm. S1 and S2 normal without gallop, murmur, click, rub or other extra sounds. Abdomen:  Bowel sounds positive,abdomen soft and non-tender without masses, organomegaly or hernias noted. no renal bruits  Pulses:  2+ rad pulses Extremities:  no pedal edema Skin:  Intact without suspicious lesions or rashes   Impression & Recommendations:  Problem # 1:  GASTROENTERITIS, VIRAL (ICD-008.8) asthmatic bronchitis resolving nicely.  sounds  like had viral gastro, now resloved.  push fluids supportive care. return if red flags.    Complete Medication List: 1)  Singulair 10 Mg Tabs (Montelukast sodium) .... Take one by mouth dialy 2)  Centrum Silver Tabs (Multiple vitamins-minerals) .... Take one by mouth daily 3)  Caltrate 600 Tabs (Calcium carbonate tabs) .... Take by mouth as directed 4)  Claritin 10 Mg Tabs (Loratadine) .... Take by mouth as directed as needed 5)  Vesicare 5 Mg Tabs (Solifenacin succinate) .... One daily 6)  Proair Hfa 108 (90 Base) Mcg/act Aers (Albuterol sulfate) .... 2 puffs up  to every 4 hours as needed 7)  Zocor 40 Mg Tabs (Simvastatin) .... Take one and one half by mouth daily 8)  Meclizine Hcl 25 Mg Tabs (Meclizine hcl) .... Take 1/2 to 1 tablet by mouth four times a day as needed 9)  Fish Oil Caps  .... Two capsules by Galen Manila daily  Patient Instructions: 1)  I'm glad you're feeling better.   2)  could have been viral stomach flu.   3)  Continue pushing fluids. 4)  Good to see you today,  5)  Let usk now if not improving as expected, or any worsening diarrhea or fevers, or worsening cough   Orders Added: 1)  Est. Patient Level III [21308]    Current Allergies (reviewed today): * DETROL * VYTORIN

## 2010-08-27 NOTE — Letter (Signed)
Summary: [facility]  [facility]   Imported By: Lanelle Bal 07/10/2010 10:00:03  _____________________________________________________________________  External Attachment:    Type:   Image     Comment:   External Document

## 2010-08-27 NOTE — Letter (Signed)
Summary: Results Follow up Letter  [name] [facility]  [address] [place], [place] [address]   Phone: [phone]  Fax: [phone]    07/09/2010 MRN: [id number]    Cheryl Joseph [address] [place], [place]  [address]    Dear Ms. Joseph,  The following are the results of your recent test(s):  Test         Result    Pap Smear:        Normal _____  Not Normal _____ Comments: ______________________________________________________ Cholesterol: LDL(Bad cholesterol):         Your goal is less than:         HDL (Good cholesterol):       Your goal is more than: Comments:  ______________________________________________________ Mammogram:        Normal __X___  Not Normal _____ Comments:Repeat in one year.   ___________________________________________________________________ Hemoccult:        Normal _____  Not normal _______ Comments:    _____________________________________________________________________ Other Tests:    We routinely do not discuss normal results over the telephone.  If you desire a copy of the results, or you have any questions about this information we can discuss them at your next office visit.   Sincerely,    Idamae Schuller Tower,MD  MT/ri

## 2011-05-27 ENCOUNTER — Ambulatory Visit (INDEPENDENT_AMBULATORY_CARE_PROVIDER_SITE_OTHER): Payer: PRIVATE HEALTH INSURANCE

## 2011-05-27 DIAGNOSIS — Z23 Encounter for immunization: Secondary | ICD-10-CM

## 2011-06-10 ENCOUNTER — Telehealth: Payer: Self-pay | Admitting: Family Medicine

## 2011-06-10 DIAGNOSIS — R030 Elevated blood-pressure reading, without diagnosis of hypertension: Secondary | ICD-10-CM

## 2011-06-10 DIAGNOSIS — E785 Hyperlipidemia, unspecified: Secondary | ICD-10-CM

## 2011-06-10 DIAGNOSIS — M899 Disorder of bone, unspecified: Secondary | ICD-10-CM

## 2011-06-10 DIAGNOSIS — E7800 Pure hypercholesterolemia, unspecified: Secondary | ICD-10-CM

## 2011-06-10 DIAGNOSIS — R7309 Other abnormal glucose: Secondary | ICD-10-CM

## 2011-06-10 NOTE — Telephone Encounter (Signed)
Message copied by Judy Pimple on Thu Jun 10, 2011  7:07 PM ------      Message from: Alvina Chou      Created: Thu Jun 03, 2011 10:52 AM      Regarding: Labs for Fri 11-16       Patient is scheduled for CPX labs, please order future labs, Thanks , Camelia Eng

## 2011-06-11 ENCOUNTER — Other Ambulatory Visit (INDEPENDENT_AMBULATORY_CARE_PROVIDER_SITE_OTHER): Payer: PRIVATE HEALTH INSURANCE

## 2011-06-11 DIAGNOSIS — R7309 Other abnormal glucose: Secondary | ICD-10-CM

## 2011-06-11 DIAGNOSIS — R030 Elevated blood-pressure reading, without diagnosis of hypertension: Secondary | ICD-10-CM

## 2011-06-11 DIAGNOSIS — E785 Hyperlipidemia, unspecified: Secondary | ICD-10-CM

## 2011-06-11 DIAGNOSIS — M899 Disorder of bone, unspecified: Secondary | ICD-10-CM

## 2011-06-11 LAB — LDL CHOLESTEROL, DIRECT: Direct LDL: 141.3 mg/dL

## 2011-06-11 LAB — CBC WITH DIFFERENTIAL/PLATELET
Basophils Absolute: 0 10*3/uL (ref 0.0–0.1)
Eosinophils Absolute: 0.1 10*3/uL (ref 0.0–0.7)
Eosinophils Relative: 2.4 % (ref 0.0–5.0)
MCHC: 33.7 g/dL (ref 30.0–36.0)
MCV: 95.8 fl (ref 78.0–100.0)
Monocytes Absolute: 0.5 10*3/uL (ref 0.1–1.0)
Neutrophils Relative %: 48.2 % (ref 43.0–77.0)
Platelets: 228 10*3/uL (ref 150.0–400.0)
WBC: 5.2 10*3/uL (ref 4.5–10.5)

## 2011-06-11 LAB — COMPREHENSIVE METABOLIC PANEL
ALT: 15 U/L (ref 0–35)
AST: 20 U/L (ref 0–37)
Albumin: 3.7 g/dL (ref 3.5–5.2)
Alkaline Phosphatase: 75 U/L (ref 39–117)
Potassium: 4.5 mEq/L (ref 3.5–5.1)
Sodium: 143 mEq/L (ref 135–145)
Total Protein: 6.9 g/dL (ref 6.0–8.3)

## 2011-06-11 LAB — LIPID PANEL
Total CHOL/HDL Ratio: 5
VLDL: 42.8 mg/dL — ABNORMAL HIGH (ref 0.0–40.0)

## 2011-06-12 LAB — VITAMIN D 25 HYDROXY (VIT D DEFICIENCY, FRACTURES): Vit D, 25-Hydroxy: 49 ng/mL (ref 30–89)

## 2011-06-15 ENCOUNTER — Other Ambulatory Visit: Payer: Self-pay

## 2011-06-15 MED ORDER — MONTELUKAST SODIUM 10 MG PO TABS: 10.0000 mg | ORAL_TABLET | Freq: Every day | ORAL | Status: DC

## 2011-06-15 NOTE — Telephone Encounter (Signed)
[facility] faxed refill request Montelukast 10 mg #30 x0 pt already scheduled CPX 06/22/11.

## 2011-06-21 ENCOUNTER — Encounter: Payer: Self-pay | Admitting: Family Medicine

## 2011-06-22 ENCOUNTER — Encounter: Payer: Self-pay | Admitting: Family Medicine

## 2011-06-22 ENCOUNTER — Ambulatory Visit (INDEPENDENT_AMBULATORY_CARE_PROVIDER_SITE_OTHER): Payer: PRIVATE HEALTH INSURANCE | Admitting: Family Medicine

## 2011-06-22 VITALS — BP 132/80 | HR 64 | Temp 97.4°F | Ht 63.25 in | Wt 196.8 lb

## 2011-06-22 DIAGNOSIS — L989 Disorder of the skin and subcutaneous tissue, unspecified: Secondary | ICD-10-CM

## 2011-06-22 DIAGNOSIS — E785 Hyperlipidemia, unspecified: Secondary | ICD-10-CM

## 2011-06-22 DIAGNOSIS — L538 Other specified erythematous conditions: Secondary | ICD-10-CM

## 2011-06-22 DIAGNOSIS — R7309 Other abnormal glucose: Secondary | ICD-10-CM

## 2011-06-22 DIAGNOSIS — M949 Disorder of cartilage, unspecified: Secondary | ICD-10-CM

## 2011-06-22 DIAGNOSIS — Z1231 Encounter for screening mammogram for malignant neoplasm of breast: Secondary | ICD-10-CM

## 2011-06-22 DIAGNOSIS — N318 Other neuromuscular dysfunction of bladder: Secondary | ICD-10-CM

## 2011-06-22 DIAGNOSIS — R030 Elevated blood-pressure reading, without diagnosis of hypertension: Secondary | ICD-10-CM

## 2011-06-22 DIAGNOSIS — M899 Disorder of bone, unspecified: Secondary | ICD-10-CM

## 2011-06-22 DIAGNOSIS — L304 Erythema intertrigo: Secondary | ICD-10-CM

## 2011-06-22 MED ORDER — ALBUTEROL SULFATE HFA 108 (90 BASE) MCG/ACT IN AERS: 2.0000 | INHALATION_SPRAY | RESPIRATORY_TRACT | Status: DC | PRN

## 2011-06-22 MED ORDER — KETOCONAZOLE 2 % EX CREA: TOPICAL_CREAM | Freq: Every day | CUTANEOUS | Status: DC

## 2011-06-22 MED ORDER — SIMVASTATIN 40 MG PO TABS: 60.0000 mg | ORAL_TABLET | Freq: Every day | ORAL | Status: DC

## 2011-06-22 MED ORDER — OXYBUTYNIN 3.9 MG/24HR TD PTTW: 1.0000 | MEDICATED_PATCH | TRANSDERMAL | Status: DC

## 2011-06-22 MED ORDER — MONTELUKAST SODIUM 10 MG PO TABS: 10.0000 mg | ORAL_TABLET | Freq: Every day | ORAL | Status: DC

## 2011-06-22 NOTE — Assessment & Plan Note (Signed)
Very mild under R breast  Will tx with ketoconazole cream and disc ways to keep dry Adv to update if not resolved in 1-2 wk

## 2011-06-22 NOTE — Assessment & Plan Note (Signed)
Scheduled annual screening mammogram Nl breast exam today  Encouraged monthly self exams   

## 2011-06-22 NOTE — Assessment & Plan Note (Signed)
utd dexa- which was improved Good vit D level Disc exercise and safety Will re check in about a year

## 2011-06-22 NOTE — Assessment & Plan Note (Signed)
Chol is up due to missed doses Rev need to be compliant with med - even if that means taking in am instead of pm  Disc goals for lipids and reasons to control them Rev labs with pt Rev low sat fat diet in detail  Re check in 3 mo fasting

## 2011-06-22 NOTE — Patient Instructions (Addendum)
We will schedule mammogram at check out  It is ok to take your cholesterol med in the am if you cannot remember it in the pm Schedule fasting lab in 3 months  Stop vesicare and switch back to oxytrol patch for bladder  Use the ketoconazole cream on area under breast and also on foot  If spot on foot does not improve in 2 weeks - call us for a deramatology referral for further evaluation  Eat a healthy diet and keep exercising

## 2011-06-22 NOTE — Assessment & Plan Note (Signed)
bp is fairly good today Enc further healthy diet and exercise and wt loss effort

## 2011-06-22 NOTE — Assessment & Plan Note (Signed)
Well controlled with a1c of 5.9 currently  Rev low glycemic diet and need for wt loss  Urged to keep up the exercise

## 2011-06-22 NOTE — Assessment & Plan Note (Signed)
On medial L heel - 1 cm and circumscribed  Will tx as ringworm  But cannot r/o skin cancer If not resolved in 2 wk with ketoconazole cream- will ref to derm for eval

## 2011-06-22 NOTE — Assessment & Plan Note (Signed)
Will change back to oxytrol patch since this worked well with less side eff

## 2011-06-22 NOTE — Progress Notes (Signed)
Subjective:    Patient ID: Cheryl Joseph, female    DOB: 08-Aug-1940, 70 y.o.   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  Trying to decide if she would like to retire -- but does love the work   Will need meds refilled   Wt is down 2 lb with bmi of 34- not much change Diet is eating a healthy diet  Exercise -- is doing a dvd walking and walks in the mall  Not fair - loves to eat    Lipids- diet and zocor controlled Lab Results  Component Value Date   CHOL 213* 06/11/2011   CHOL 185 06/01/2010   CHOL 180 05/12/2009   Lab Results  Component Value Date   HDL 46.20 06/11/2011   HDL 39.50 06/01/2010   HDL 40.98* 05/12/2009   Lab Results  Component Value Date   LDLCALC 109* 05/12/2009   LDLCALC 100* 11/11/2008   LDLCALC 116* 05/06/2008   Lab Results  Component Value Date   TRIG 214.0* 06/11/2011   TRIG 231.0* 06/01/2010   TRIG 188.0* 05/12/2009   Lab Results  Component Value Date   CHOLHDL 5 06/11/2011   CHOLHDL 5 06/01/2010   CHOLHDL 5 05/12/2009   Lab Results  Component Value Date   LDLDIRECT 141.3 06/11/2011   LDLDIRECT 110.4 06/01/2010   LDLDIRECT 111.0 04/27/2007   up quite a bit- unexpected Pt states she is not taking medicine like she should  Forgets to take it frequently   Hx of mildly elevated bp - but good today 132/80 No ha or palpitation or edema  Hyperglycemia is well controlled with diet a1c is 5.9 Diet and exercise - and trying to loose weight   Osteopenia -- vit D level is good at 49 dexa 1/12 osteopenia slt  improved Exercise-- important - does use some wts  Hx of fx-none   Up to date on all imms incl flu shot  colonosc 11/05 normal   mammo was 12/11- wants to set that up at [facility]  Self exam   Nl pap 10/10  Remote hpv- all nl paps since 1996 No new partners  Will wait one more year before pap   Patient Active Problem List  Diagnoses  . HYPERLIPIDEMIA  . OBESITY  . ALLERGIC RHINITIS, SEASONAL  . ASTHMA  . OVERACTIVE BLADDER  . RHEUMATOID ARTHRITIS  . PAIN IN JOINT, HAND  . KNEE PAIN, RIGHT  . OSTEOPENIA  . HYPERGLYCEMIA, FASTING  . ELEVATED BP W/O HYPERTENSION  . Other screening mammogram  . Skin lesion  . Intertrigo   Past Medical History  Diagnosis Date  . HPV in female 1996    HPV with colposcopy (all neg paps since)  . Hyperlipidemia   . Asthma   . Osteopenia   . Obesity   . Arthritis     RA hands(seronegative)  . Back pain   . Shoulder pain    Past Surgical History  Procedure Date  . Tonsillectomy    History  Substance Use Topics  . Smoking status: Never Smoker   . Smokeless tobacco: Not on file  . Alcohol Use: No   Family History  Problem Relation Age of Onset  . Diabetes Mother   . Hyperlipidemia Mother   . Heart disease Mother   . Hypertension Mother   . Obesity Mother   . Kyphosis Mother   . Heart disease Father   . Hypertension Father   . Cancer Father     lung Cancer smoker  . Kyphosis Sister   . Diabetes Brother    Allergies  Allergen Reactions  . Ezetimibe-Simvastatin     REACTION: leg pain  . Tolterodine Tartrate     REACTION: side effects  . Vesicare (Solifenacin Succinate)     Eye problems    Current Outpatient Prescriptions on File Prior to Visit  Medication Sig Dispense Refill  . Calcium Carbonate (CALTRATE 600 PO) Take by mouth as directed.        . Multiple Vitamin (MULTIVITAMIN) tablet Take 1 tablet by mouth daily.        . Omega-3 Fatty Acids (FISH OIL PO) Take 2 capsules by mouth daily.              Review of  Systems Review of Systems  Constitutional: Negative for fever, appetite change, fatigue and unexpected weight change.  Eyes: Negative for pain and visual disturbance.  Respiratory: Negative for cough and shortness of breath.   Cardiovascular: Negative for cp or palpitations    Gastrointestinal: Negative for nausea, diarrhea and constipation.  Genitourinary: Negative for urgency and frequency.  Skin: Negative for pallor and pos for itchy rash and skin lesion Neurological: Negative for weakness, light-headedness, numbness and headaches.  Hematological: Negative for adenopathy. Does not bruise/bleed easily.  Psychiatric/Behavioral: Negative for dysphoric mood. The patient is not nervous/anxious.         Objective:   Physical Exam  Constitutional: She appears well-developed and well-nourished. No distress.       overwt and well appearing   HENT:  Head: Normocephalic and atraumatic.  Right Ear: External ear normal.  Left Ear: External ear normal.  Nose: Nose normal.  Mouth/Throat: Oropharynx is clear and moist.  Eyes: Conjunctivae and EOM are normal. Pupils are equal, round, and reactive to light. No scleral icterus.  Neck: Normal range of motion. Neck supple. No JVD present. No thyromegaly present.  Cardiovascular: Normal rate, regular rhythm, normal heart sounds and intact distal pulses.  Exam reveals no gallop.   Pulmonary/Chest: Effort normal and breath sounds normal. No respiratory distress. She has no wheezes. She exhibits no tenderness.  Abdominal: Soft. Bowel sounds are normal. She exhibits no distension and no mass. There is no tenderness.  Genitourinary: No breast swelling, tenderness, discharge or bleeding.       No breast masses noted  Small area of circumscribed erythema under R breast resembling candidal intertrigo  Musculoskeletal: Normal range of motion. She exhibits no edema and no tenderness.  Lymphadenopathy:    She has no cervical adenopathy.  Neurological: She is  alert. She has normal reflexes. No cranial nerve deficit. She exhibits normal muscle tone. Coordination normal.  Skin: Skin is warm and dry. Rash noted. There is erythema. No pallor.       Area of redness under R breast -not raised , circumscribed L ankle / medial 1 cm area of circumscribed redness with slt central clearing that is raised with some scale  Psychiatric: She has a normal mood and affect.  Assessment & Plan:

## 2011-08-11 ENCOUNTER — Ambulatory Visit: Payer: Self-pay | Admitting: Family Medicine

## 2011-08-13 ENCOUNTER — Encounter: Payer: Self-pay | Admitting: Family Medicine

## 2011-08-16 ENCOUNTER — Encounter: Payer: Self-pay | Admitting: *Deleted

## 2011-09-03 ENCOUNTER — Telehealth: Payer: Self-pay | Admitting: *Deleted

## 2011-09-03 NOTE — Telephone Encounter (Signed)
Patient says that [employer] and [employer] denied her claim for Singulair and says she now needs to do step therapy and wants to know what to do. Please advise!!!

## 2011-09-03 NOTE — Telephone Encounter (Signed)
Left vm for pt to callback 

## 2011-09-03 NOTE — Telephone Encounter (Signed)
That would mean trying an inhaled corticosteroid - one that they cover , to see if it works as well Please call your insurance co and find out what inhaled corticosteroid they cover and we will call it in-- then sched f/u to see how it is working I will wait to hear from her

## 2011-09-06 NOTE — Telephone Encounter (Signed)
Patient notified as instructed by telephone. Pt will contact insurance co and call back to triage with info. Pt already scheduled for appt with Dr Milinda Antis 09/22/11 at 8:30am.

## 2011-09-15 ENCOUNTER — Other Ambulatory Visit (INDEPENDENT_AMBULATORY_CARE_PROVIDER_SITE_OTHER): Payer: Medicare Other

## 2011-09-15 DIAGNOSIS — E785 Hyperlipidemia, unspecified: Secondary | ICD-10-CM

## 2011-09-15 LAB — LDL CHOLESTEROL, DIRECT: Direct LDL: 98.5 mg/dL

## 2011-09-15 LAB — ALT: ALT: 16 U/L (ref 0–35)

## 2011-09-15 LAB — LIPID PANEL: Total CHOL/HDL Ratio: 4

## 2011-09-22 ENCOUNTER — Ambulatory Visit (INDEPENDENT_AMBULATORY_CARE_PROVIDER_SITE_OTHER): Payer: Medicare Other | Admitting: Family Medicine

## 2011-09-22 ENCOUNTER — Encounter: Payer: Self-pay | Admitting: Family Medicine

## 2011-09-22 VITALS — BP 130/80 | HR 72 | Temp 97.5°F | Ht 63.25 in | Wt 196.5 lb

## 2011-09-22 DIAGNOSIS — E669 Obesity, unspecified: Secondary | ICD-10-CM

## 2011-09-22 DIAGNOSIS — E785 Hyperlipidemia, unspecified: Secondary | ICD-10-CM

## 2011-09-22 NOTE — Assessment & Plan Note (Signed)
Improved with better compliance - statin and diet Disc goals for lipids and reasons to control them Rev labs with pt Rev low sat fat diet in detail  Work on low sugar diet for trig too Is working on wt loss and exercise

## 2011-09-22 NOTE — Progress Notes (Signed)
Subjective:    Patient ID: Cheryl Joseph, female    DOB: 1941-04-04, 71 y.o.   MRN: [id number]  HPI Here for f/u of hyperlipidemia   Her blue cross/ medicare are no longer covering medicines  Will no longer simvastatin - has not looked at alternative  Also will no longer cover singulair - for her asthma (wants to pay for out of pocket and take it every other day)       On zocor and diet Prev dosing was not compliant - trouble remb to take at night Lab Results  Component Value Date   CHOL 174 09/15/2011   CHOL 213* 06/11/2011   CHOL 185 06/01/2010   Lab Results  Component Value Date   HDL 40.40 09/15/2011   HDL 40.98 06/11/2011   HDL 39.50 06/01/2010   Lab Results  Component Value Date   LDLCALC 109* 05/12/2009   LDLCALC 100* 11/11/2008   LDLCALC 116* 05/06/2008   Lab Results  Component Value Date   TRIG 221.0* 09/15/2011   TRIG 214.0* 06/11/2011   TRIG 231.0* 06/01/2010   Lab Results  Component Value Date   CHOLHDL 4 09/15/2011   CHOLHDL 5 06/11/2011   CHOLHDL 5 06/01/2010   Lab Results  Component Value Date   LDLDIRECT 98.5 09/15/2011   LDLDIRECT 141.3 06/11/2011   LDLDIRECT 110.4 06/01/2010   LDL is down significantly  Trig a bit high Diet - is really and really trying -- eating more veg and fruits  Also stays away from refined sugar - uses agave   Wt stable with bmi of 34 Does want to loose wt   Exercise -- using new exercise machine- using every other day 15 min/ also walks at mall/ and does video At least 5 d per week   Patient Active Problem List  Diagnoses  . HYPERLIPIDEMIA  . OBESITY  . ALLERGIC RHINITIS, SEASONAL  . ASTHMA  . OVERACTIVE BLADDER  . RHEUMATOID ARTHRITIS  . PAIN IN JOINT, HAND  . KNEE PAIN, RIGHT  . OSTEOPENIA  . HYPERGLYCEMIA, FASTING  . ELEVATED BP W/O HYPERTENSION  . Other screening mammogram  . Skin lesion  . Intertrigo   Past Medical History  Diagnosis Date  . HPV in female 1996    HPV with colposcopy (all neg  paps since)  . Hyperlipidemia   . Asthma   . Osteopenia   . Obesity   . Arthritis     RA hands(seronegative)  . Back pain   . Shoulder pain    Past Surgical History  Procedure Date  . Tonsillectomy    History  Substance Use Topics  . Smoking status: Never Smoker   . Smokeless tobacco: Not on file  . Alcohol Use: No   Family History  Problem Relation Age of Onset  . Diabetes Mother   . Hyperlipidemia Mother   . Heart disease Mother   . Hypertension Mother   . Obesity Mother   . Kyphosis Mother   . Heart disease Father   . Hypertension Father   . Cancer Father     lung Cancer smoker  . Kyphosis Sister   . Diabetes Brother    Allergies  Allergen Reactions  . Ezetimibe-Simvastatin     REACTION: leg pain  . Tolterodine Tartrate     REACTION: side effects  . Vesicare (Solifenacin Succinate)     Eye problems    Current Outpatient Prescriptions on File Prior to Visit  Medication Sig Dispense Refill  . albuterol (PROAIR  HFA) 108 (90 BASE) MCG/ACT inhaler Inhale 2 puffs into the lungs every 4 (four) hours as needed for wheezing or shortness of breath.  1 Inhaler  11  . Calcium Carbonate (CALTRATE 600 PO) Take by mouth as directed.        Marland Kitchen ketoconazole (NIZORAL) 2 % cream Apply topically daily.  15 g  1  . montelukast (SINGULAIR) 10 MG tablet Take 1 tablet (10 mg total) by mouth daily.  30 tablet  11  . Multiple Vitamin (MULTIVITAMIN) tablet Take 1 tablet by mouth daily.        . Omega-3 Fatty Acids (FISH OIL PO) Take 2 capsules by mouth daily.        Marland Kitchen oxybutynin (OXYTROL) 3.9 MG/24HR Place 1 patch onto the skin 2 (two) times a week.  8 patch  12  . simvastatin (ZOCOR) 40 MG tablet Take 1.5 tablets (60 mg total) by mouth at bedtime.  45 tablet  11       Review of Systems Review of Systems  Constitutional: Negative for fever, appetite change, fatigue and unexpected weight change.  Eyes: Negative for pain and visual disturbance.  Respiratory: Negative for cough and  shortness of breath.   Cardiovascular: Negative for cp or palpitations    Gastrointestinal: Negative for nausea, diarrhea and constipation.  Genitourinary: Negative for urgency and frequency.  Skin: Negative for pallor or rash   Neurological: Negative for weakness, light-headedness, numbness and headaches.  Hematological: Negative for adenopathy. Does not bruise/bleed easily.  Psychiatric/Behavioral: Negative for dysphoric mood. The patient is not nervous/anxious.          Objective:   Physical Exam  Constitutional: She appears well-developed and well-nourished. No distress.  HENT:  Head: Normocephalic and atraumatic.  Mouth/Throat: Oropharynx is clear and moist.  Eyes: Conjunctivae and EOM are normal. Pupils are equal, round, and reactive to light. No scleral icterus.  Neck: Normal range of motion. Neck supple.  Cardiovascular: Normal rate, regular rhythm and normal heart sounds.  Exam reveals no gallop.   Pulmonary/Chest: Effort normal and breath sounds normal. No respiratory distress. She has no wheezes.  Musculoskeletal: She exhibits no edema.  Lymphadenopathy:    She has no cervical adenopathy.  Neurological: She is alert. She has normal reflexes.  Skin: Skin is warm and dry. No rash noted. No erythema. No pallor.  Psychiatric: She has a normal mood and affect.          Assessment & Plan:

## 2011-09-22 NOTE — Patient Instructions (Signed)
Keep up the good work with healthy diet and exercise  If you need to change singular to a steroid inhaler- let me know which ones your insurance covers If you need to change zocor(simvastatin)- let me know what statins your insurance covers  Cholesterol is in good control  Keep working on weight loss  Use materials from [employer] watchers  Schedule annual exam in nov with labs prior

## 2011-09-22 NOTE — Assessment & Plan Note (Signed)
Discussed how this problem influences overall health and the risks it imposes  Reviewed plan for weight loss with lower calorie diet (via better food choices and also portion control or program like weight watchers) and exercise building up to or more than 30 minutes 5 days per week including some aerobic activity   Pt is really working on it!

## 2011-09-23 ENCOUNTER — Telehealth: Payer: Self-pay

## 2011-09-23 ENCOUNTER — Encounter: Payer: Self-pay | Admitting: Family Medicine

## 2011-09-23 MED ORDER — ZAFIRLUKAST 10 MG PO TABS: 10.0000 mg | ORAL_TABLET | Freq: Two times a day (BID) | ORAL | Status: DC

## 2011-09-23 MED ORDER — SIMVASTATIN 40 MG PO TABS: 40.0000 mg | ORAL_TABLET | Freq: Every day | ORAL | Status: DC

## 2011-09-23 NOTE — Telephone Encounter (Signed)
Go ahead and change the singulair and take zocor down to 40 Can call in for 1 year supply

## 2011-09-23 NOTE — Telephone Encounter (Signed)
Meds sent to pharmacy.  LMOVM of patient's home phone.

## 2011-09-23 NOTE — Telephone Encounter (Signed)
Pt said she spoke with [facility] and they will pay for Zafirlukast 10 mg instead of singulair 10 mg and simivastatin 40 mg only approved for 30 pills per month. Pt takes 60 mg daily. Pt wants to know what to do ( if need to decrease dosage or what). Pt uses [facility] pharmacy.Please advise.

## 2012-02-25 ENCOUNTER — Ambulatory Visit (INDEPENDENT_AMBULATORY_CARE_PROVIDER_SITE_OTHER): Payer: Medicare Other | Admitting: Family Medicine

## 2012-02-25 ENCOUNTER — Encounter: Payer: Self-pay | Admitting: Family Medicine

## 2012-02-25 VITALS — BP 126/74 | HR 72 | Temp 98.0°F | Wt 196.8 lb

## 2012-02-25 DIAGNOSIS — L03116 Cellulitis of left lower limb: Secondary | ICD-10-CM

## 2012-02-25 DIAGNOSIS — L02419 Cutaneous abscess of limb, unspecified: Secondary | ICD-10-CM

## 2012-02-25 MED ORDER — CEPHALEXIN 500 MG PO CAPS: 1000.0000 mg | ORAL_CAPSULE | Freq: Two times a day (BID) | ORAL | Status: DC

## 2012-02-25 NOTE — Progress Notes (Signed)
   Nature conservation officer at [facility] [address] [place] [address] Phone: [phone] Fax: [phone]  Date:  02/25/2012   Name:  Cheryl Joseph   DOB:  Apr 17, 1941   MRN:  [id number]  PCP:  Roxy Manns, MD    Chief Complaint: Swollen, red left leg   History of Present Illness:  Cheryl Joseph is a 71 y.o. very pleasant female patient who presents with the following:  Swollen red, left leg:  2 days history of left leg swelling, redness. No trauma orinjury. No pain in the popliteal region. No bruising. Mild swelling. No skin tears. Some appearance of something similar to bug bites or other small bite.  Past Medical History, Surgical History, Social History, Family History, Problem List, Medications, and Allergies have been reviewed and updated if relevant.  Current Outpatient Prescriptions on File Prior to Visit  Medication Sig Dispense Refill  . albuterol (PROAIR HFA) 108 (90 BASE) MCG/ACT inhaler Inhale 2 puffs into the lungs every 4 (four) hours as needed for wheezing or shortness of breath.  1 Inhaler  11  . Calcium Carbonate (CALTRATE 600 PO) Take by mouth as directed.        Marland Kitchen ketoconazole (NIZORAL) 2 % cream Apply topically daily.  15 g  1  . Multiple Vitamin (MULTIVITAMIN) tablet Take 1 tablet by mouth daily.        . Omega-3 Fatty Acids (FISH OIL PO) Take 2 capsules by mouth daily.        Marland Kitchen oxybutynin (OXYTROL) 3.9 MG/24HR Place 1 patch onto the skin 2 (two) times a week.  8 patch  12  . simvastatin (ZOCOR) 40 MG tablet Take 1 tablet (40 mg total) by mouth at bedtime.  30 tablet  12  . zafirlukast (ACCOLATE) 10 MG tablet Take 1 tablet (10 mg total) by mouth 2 (two) times daily.  60 tablet  12    Review of Systems: ROS: GEN: Acute illness details above GI: Tolerating PO intake GU: maintaining adequate hydration and urination Pulm: No SOB Interactive and getting along well at home.  Otherwise, ROS is as per the HPI. k  Physical Examination: Filed  Vitals:   02/25/12 0943  BP: 126/74  Pulse: 72  Temp: 98 F (36.7 C)   Filed Vitals:   02/25/12 0943  Weight: 196 lb 12 oz (89.245 kg)   There is no height on file to calculate BMI. Ideal Body Weight:     GEN: WDWN, NAD, Non-toxic, Alert & Oriented x 3 HEENT: Atraumatic, Normocephalic.  Ears and Nose: No external deformity. EXTR: L leg more swollen compared to R, mild warmth, there is some evidence of elevated, non-vesicular lesions, few on the distal extr. Neg homans NEURO: Normal gait.  PSYCH: Normally interactive. Conversant. Not depressed or anxious appearing.  Calm demeanor.    Assessment and Plan:  1. Left leg cellulitis    Rx for cellulitis, elevated when able, discussed signs of worsening and to seek medical care  Orders Today:  No orders of the defined types were placed in this encounter.    Medications Today: (Includes new updates added during medication reconciliation) Meds ordered this encounter  Medications  . cephALEXin (KEFLEX) 500 MG capsule    Sig: Take 2 capsules (1,000 mg total) by mouth 2 (two) times daily.    Dispense:  40 capsule    Refill:  0     Hannah Beat, MD

## 2012-02-28 ENCOUNTER — Telehealth: Payer: Self-pay | Admitting: Family Medicine

## 2012-02-28 ENCOUNTER — Encounter: Payer: Self-pay | Admitting: Family Medicine

## 2012-02-28 ENCOUNTER — Ambulatory Visit (INDEPENDENT_AMBULATORY_CARE_PROVIDER_SITE_OTHER): Payer: Medicare Other | Admitting: Family Medicine

## 2012-02-28 VITALS — BP 156/72 | HR 68 | Temp 97.8°F | Wt 197.0 lb

## 2012-02-28 DIAGNOSIS — L03119 Cellulitis of unspecified part of limb: Secondary | ICD-10-CM

## 2012-02-28 DIAGNOSIS — L03116 Cellulitis of left lower limb: Secondary | ICD-10-CM

## 2012-02-28 MED ORDER — SULFAMETHOXAZOLE-TRIMETHOPRIM 800-160 MG PO TABS: 1.0000 | ORAL_TABLET | Freq: Two times a day (BID) | ORAL | Status: AC

## 2012-02-28 NOTE — Telephone Encounter (Signed)
Caller: Cheryl Joseph/Patient; PCP: Roxy Manns A.; CB#: [phone]. Call regarding Follow Up To Office Visit. Caller reports she was seen in the office last week, Friday 8/2 and dx'd with "beginnings of Cellulitis of her left leg." Started on Cephalexin 1000 mg bid and after 6 doses, leg does not look much better. Caller reports her leg is still red and swollen with some heat present. Caller reports redness and swelling has increased and there seems to be a sore in this area. MD advised her that sxs should look better today, Monday 8/5. Per Leg Non-Injury, Signs and Symptoms of localized Infection not improved or worsening despite home care, See in 4hrs Disposition. No open appts, Caller advised of same and advised a note will be sent for MD to advise of current sxs. PLEASE CALL [facility] RE AN APPT/DIRECTION FROM MD.

## 2012-02-28 NOTE — Progress Notes (Signed)
Subjective:    Patient ID: Cheryl Joseph, female    DOB: 07-Feb-1941, 71 y.o.   MRN: [id number]  HPI Here for cellulitis of L leg   Was out on wed-may have had some bug bites  Noticed on Thursday , some redness and itching and heat and swelling Came in on Friday - tx Dr Patsy Lager - keflex 1000 bid   Now is in for a re check  Is not quite as red or swollen , but thinks that the redness is moving up her leg 2-3 inches Not very painful (sore in ams) Just does not feel good in general -not much appetite  Is elevating when not working   A few times a bit chilled , but no fever   Shaves legs occas -- has not lately   Patient Active Problem List  Diagnosis  . HYPERLIPIDEMIA  . OBESITY  . ALLERGIC RHINITIS, SEASONAL  . ASTHMA  . OVERACTIVE BLADDER  . RHEUMATOID ARTHRITIS  . PAIN IN JOINT, HAND  . KNEE PAIN, RIGHT  . OSTEOPENIA  . HYPERGLYCEMIA, FASTING  . ELEVATED BP W/O HYPERTENSION  . Other screening mammogram  . Skin lesion  . Intertrigo   Past Medical History  Diagnosis Date  . HPV in female 1996    HPV with colposcopy (all neg paps since)  . Hyperlipidemia   . Asthma   . Osteopenia   . Obesity   . Arthritis     RA hands(seronegative)  . Back pain   . Shoulder pain    Past Surgical History  Procedure Date  . Tonsillectomy    History  Substance Use Topics  . Smoking status: Never Smoker   . Smokeless tobacco: Not on file  . Alcohol Use: No   Family History  Problem Relation Age of Onset  . Diabetes Mother   . Hyperlipidemia Mother   . Heart disease Mother   . Hypertension Mother   . Obesity Mother   . Kyphosis Mother   . Heart disease Father   . Hypertension Father   . Cancer Father     lung Cancer smoker  . Kyphosis Sister   . Diabetes Brother    Allergies  Allergen Reactions  . Ezetimibe-Simvastatin     REACTION: leg pain  . Tolterodine Tartrate     REACTION: side effects  . Vesicare (Solifenacin Succinate)     Eye problems    Current  Outpatient Prescriptions on File Prior to Visit  Medication Sig Dispense Refill  . albuterol (PROAIR HFA) 108 (90 BASE) MCG/ACT inhaler Inhale 2 puffs into the lungs every 4 (four) hours as needed for wheezing or shortness of breath.  1 Inhaler  11  . Calcium Carbonate (CALTRATE 600 PO) Take by mouth as directed.        . cephALEXin (KEFLEX) 500 MG capsule Take 2 capsules (1,000 mg total) by mouth 2 (two) times daily.  40 capsule  0  . ketoconazole (NIZORAL) 2 % cream Apply topically daily.  15 g  1  . Multiple Vitamin (MULTIVITAMIN) tablet Take 1 tablet by mouth daily.        . Omega-3 Fatty Acids (FISH OIL PO) Take 2 capsules by mouth daily.        Marland Kitchen oxybutynin (OXYTROL) 3.9 MG/24HR Place 1 patch onto the skin 2 (two) times a week.  8 patch  12  . simvastatin (ZOCOR) 40 MG tablet Take 1 tablet (40 mg total) by mouth at bedtime.  30 tablet  12  .  zafirlukast (ACCOLATE) 10 MG tablet Take 1 tablet (10 mg total) by mouth 2 (two) times daily.  60 tablet  12       Review of Systems Review of Systems  Constitutional: Negative for fever, appetite change, fatigue and unexpected weight change.  Eyes: Negative for pain and visual disturbance.  Respiratory: Negative for cough and shortness of breath.   Cardiovascular: Negative for cp or palpitations    Gastrointestinal: Negative for nausea, diarrhea and constipation.  Genitourinary: Negative for urgency and frequency.  Skin: Negative for pallor, pos for redness/ ? Rash and tenderness/ swelling  Neurological: Negative for weakness, light-headedness, numbness and headaches.  Hematological: Negative for adenopathy. Does not bruise/bleed easily.  Psychiatric/Behavioral: Negative for dysphoric mood. The patient is not nervous/anxious.         Objective:   Physical Exam  Constitutional: She appears well-developed and well-nourished. No distress.  HENT:  Head: Normocephalic and atraumatic.  Eyes: Conjunctivae and EOM are normal. Pupils are equal,  round, and reactive to light. Right eye exhibits no discharge. Left eye exhibits no discharge.  Neck: Normal range of motion. Neck supple.  Cardiovascular: Normal rate and regular rhythm.   Pulmonary/Chest: Effort normal and breath sounds normal.  Musculoskeletal: She exhibits edema and tenderness.       In areas of cellulitis  Lymphadenopathy:    She has no cervical adenopathy.  Neurological: She is alert.  Skin: Skin is warm and dry. Rash noted. There is erythema.       L lower leg has some diffuse redness with papules scattered over leg -resembling folliculitis or insect bites No pustules or vesicles Mild swelling  Very mild tenderness   Psychiatric: She has a normal mood and affect.          Assessment & Plan:

## 2012-02-28 NOTE — Telephone Encounter (Signed)
Please put her in 4:15 slot today- Cheryl Joseph is aware, thanks

## 2012-02-28 NOTE — Assessment & Plan Note (Signed)
Given lack of response to keflex - do wonder about possible MRSA No drainage to cx at this time Will change to septra DS bid  F/u later this week See AVS- disc red flags to call for incl fever or worse redness

## 2012-02-28 NOTE — Patient Instructions (Addendum)
Keep area clean with antibacterial soap and water  Loosely cover  Avoid prolonged standing  When not working elevate leg and use mild warm compress Stop the keflex Start the septra 1 pill twice daily  If side effects - call  If worse/ swelling/ redness/ streaking/ fever - please let me know or seek care urgently if needed Follow up with me Wednesday or Friday or a re check

## 2012-03-01 ENCOUNTER — Encounter: Payer: Self-pay | Admitting: Family Medicine

## 2012-03-01 ENCOUNTER — Ambulatory Visit (INDEPENDENT_AMBULATORY_CARE_PROVIDER_SITE_OTHER): Payer: Medicare Other | Admitting: Family Medicine

## 2012-03-01 VITALS — BP 118/64 | HR 72 | Temp 98.1°F | Ht 65.0 in | Wt 194.8 lb

## 2012-03-01 DIAGNOSIS — L03119 Cellulitis of unspecified part of limb: Secondary | ICD-10-CM

## 2012-03-01 DIAGNOSIS — L03116 Cellulitis of left lower limb: Secondary | ICD-10-CM

## 2012-03-01 NOTE — Patient Instructions (Addendum)
I'm glad you are making progress Make follow up with me early next week for re check - but if you are much better you can cancel that  Return to work next week  For now I think that elevating leg is important Call if worse or fever Finish the antibiotic

## 2012-03-01 NOTE — Progress Notes (Signed)
Subjective:    Patient ID: Cheryl Joseph, female    DOB: Feb 28, 1941, 71 y.o.   MRN: [id number]  HPI Here for f/u of cellulitis of L leg Last visit changed from keflex to septra   The area is looking better - less red and less swelling , and less pain   New spot on arm and foot - ? Bug bites Has sat on porch  Feels tired today   No fever   Tolerating the abx ok - just a little tired   Has been elevating leg and using warm compress on leg   Patient Active Problem List  Diagnosis  . HYPERLIPIDEMIA  . OBESITY  . ALLERGIC RHINITIS, SEASONAL  . ASTHMA  . OVERACTIVE BLADDER  . RHEUMATOID ARTHRITIS  . PAIN IN JOINT, HAND  . KNEE PAIN, RIGHT  . OSTEOPENIA  . HYPERGLYCEMIA, FASTING  . ELEVATED BP W/O HYPERTENSION  . Other screening mammogram  . Skin lesion  . Intertrigo  . Cellulitis of left leg   Past Medical History  Diagnosis Date  . HPV in female 1996    HPV with colposcopy (all neg paps since)  . Hyperlipidemia   . Asthma   . Osteopenia   . Obesity   . Arthritis     RA hands(seronegative)  . Back pain   . Shoulder pain    Past Surgical History  Procedure Date  . Tonsillectomy    History  Substance Use Topics  . Smoking status: Never Smoker   . Smokeless tobacco: Not on file  . Alcohol Use: No   Family History  Problem Relation Age of Onset  . Diabetes Mother   . Hyperlipidemia Mother   . Heart disease Mother   . Hypertension Mother   . Obesity Mother   . Kyphosis Mother   . Heart disease Father   . Hypertension Father   . Cancer Father     lung Cancer smoker  . Kyphosis Sister   . Diabetes Brother    Allergies  Allergen Reactions  . Ezetimibe-Simvastatin     REACTION: leg pain  . Tolterodine Tartrate     REACTION: side effects  . Vesicare (Solifenacin Succinate)     Eye problems    Current Outpatient Prescriptions on File Prior to Visit  Medication Sig Dispense Refill  . albuterol (PROAIR HFA) 108 (90 BASE) MCG/ACT inhaler Inhale  2 puffs into the lungs every 4 (four) hours as needed for wheezing or shortness of breath.  1 Inhaler  11  . Calcium Carbonate (CALTRATE 600 PO) Take by mouth as directed.        Marland Kitchen ketoconazole (NIZORAL) 2 % cream Apply topically daily.  15 g  1  . Multiple Vitamin (MULTIVITAMIN) tablet Take 1 tablet by mouth daily.        . Omega-3 Fatty Acids (FISH OIL PO) Take 2 capsules by mouth daily.        Marland Kitchen oxybutynin (OXYTROL) 3.9 MG/24HR Place 1 patch onto the skin 2 (two) times a week.  8 patch  12  . simvastatin (ZOCOR) 40 MG tablet Take 1 tablet (40 mg total) by mouth at bedtime.  30 tablet  12  . sulfamethoxazole-trimethoprim (BACTRIM DS,SEPTRA DS) 800-160 MG per tablet Take 1 tablet by mouth 2 (two) times daily.  20 tablet  0  . zafirlukast (ACCOLATE) 10 MG tablet Take 1 tablet (10 mg total) by mouth 2 (two) times daily.  60 tablet  12      Review  of Systems Review of Systems  Constitutional: Negative for fever, appetite change, fatigue and unexpected weight change.  Eyes: Negative for pain and visual disturbance.  Respiratory: Negative for cough and shortness of breath.   Cardiovascular: Negative for cp or palpitations    Gastrointestinal: Negative for nausea, diarrhea and constipation.  Genitourinary: Negative for urgency and frequency.  Skin: Negative for pallor or rash, pos for redness that is improved, pos for few insect bites that are itchy Neurological: Negative for weakness, light-headedness, numbness and headaches.  Hematological: Negative for adenopathy. Does not bruise/bleed easily.  Psychiatric/Behavioral: Negative for dysphoric mood. The patient is not nervous/anxious.         Objective:   Physical Exam  Constitutional: She appears well-developed and well-nourished. No distress.  HENT:  Head: Normocephalic and atraumatic.  Eyes: Conjunctivae and EOM are normal. Pupils are equal, round, and reactive to light. Right eye exhibits no discharge. Left eye exhibits no discharge.    Neck: Normal range of motion.  Cardiovascular: Normal rate and regular rhythm.   Lymphadenopathy:    She has no cervical adenopathy.  Neurological: She is alert.  Skin: Skin is warm and dry. There is erythema. No pallor.       2 insect bites (papules) L arm and R foot that resemble mosquito bites with wheal L leg cellulitis is much imp - redness has decreased and also regressed  The papules resembling folliculitis are much less noticeable and red  No excoriation No pustules or drainage Swelling improved Tenderness improved  Psychiatric: She has a normal mood and affect.          Assessment & Plan:

## 2012-03-01 NOTE — Assessment & Plan Note (Signed)
Much improved with the septra Disc elevation/ warm compresses and keeping covered Few insect bites on arm L - disc using insect repellent  Will finish abx F/u next wk for re check (can cancel if better) Will finish abx

## 2012-03-08 ENCOUNTER — Encounter: Payer: Self-pay | Admitting: Family Medicine

## 2012-03-08 ENCOUNTER — Ambulatory Visit (INDEPENDENT_AMBULATORY_CARE_PROVIDER_SITE_OTHER): Payer: Medicare Other | Admitting: Family Medicine

## 2012-03-08 VITALS — BP 122/64 | HR 72 | Temp 97.7°F | Ht 65.0 in | Wt 195.0 lb

## 2012-03-08 DIAGNOSIS — L03119 Cellulitis of unspecified part of limb: Secondary | ICD-10-CM

## 2012-03-08 DIAGNOSIS — L03116 Cellulitis of left lower limb: Secondary | ICD-10-CM

## 2012-03-08 NOTE — Progress Notes (Signed)
  Subjective:    Patient ID: Cheryl Joseph, female    DOB: 23-Jul-1941, 71 y.o.   MRN: [id number]  HPI Here for f/u of cellulitis L leg Much less redness and swelling  No fever  a little nausea from the sulfa drug   Is back to work   Very little tenderness   Review of Systems    Review of Systems  Constitutional: Negative for fever, appetite change, fatigue and unexpected weight change.  Eyes: Negative for pain and visual disturbance.  Respiratory: Negative for cough and shortness of breath.   Cardiovascular: Negative for cp or palpitations    Gastrointestinal: Negative for nausea, diarrhea and constipation.  Genitourinary: Negative for urgency and frequency.  Skin: Negative for pallor or rash  neg for redness or swelling (better) Neurological: Negative for weakness, light-headedness, numbness and headaches.  Hematological: Negative for adenopathy. Does not bruise/bleed easily.  Psychiatric/Behavioral: Negative for dysphoric mood. The patient is not nervous/anxious.      Objective:   Physical Exam  Constitutional: She appears well-developed and well-nourished. No distress.       overwt and well appearing   Cardiovascular: Intact distal pulses.   Skin: Skin is warm and dry. No rash noted. No erythema. No pallor.       L leg looks back to normal - erythema and swelling are resolved and only very slt tender  No open skin           Assessment & Plan:

## 2012-03-08 NOTE — Patient Instructions (Addendum)
I'm so glad you are feeling better Cheryl Joseph last few days of antibiotic  If symptoms return or worsen let me know

## 2012-03-08 NOTE — Assessment & Plan Note (Signed)
This is pretty much resolved - will finish last few days of abx if tolerated Adv to call and update if symptoms worsen or return

## 2012-05-01 ENCOUNTER — Encounter: Payer: Self-pay | Admitting: Family Medicine

## 2012-05-01 ENCOUNTER — Ambulatory Visit (INDEPENDENT_AMBULATORY_CARE_PROVIDER_SITE_OTHER): Payer: Medicare Other | Admitting: Family Medicine

## 2012-05-01 VITALS — BP 156/82 | HR 72 | Temp 98.0°F | Ht 65.0 in | Wt 200.2 lb

## 2012-05-01 DIAGNOSIS — Z23 Encounter for immunization: Secondary | ICD-10-CM

## 2012-05-01 DIAGNOSIS — N390 Urinary tract infection, site not specified: Secondary | ICD-10-CM

## 2012-05-01 DIAGNOSIS — R300 Dysuria: Secondary | ICD-10-CM

## 2012-05-01 LAB — POCT UA - MICROSCOPIC ONLY
Casts, Ur, LPF, POC: 0
Crystals, Ur, HPF, POC: 0
Yeast, UA: 0

## 2012-05-01 LAB — POCT URINALYSIS DIPSTICK
Nitrite, UA: NEGATIVE
Protein, UA: NEGATIVE
pH, UA: 6

## 2012-05-01 MED ORDER — CIPROFLOXACIN HCL 250 MG PO TABS: 250.0000 mg | ORAL_TABLET | Freq: Two times a day (BID) | ORAL | Status: DC

## 2012-05-01 NOTE — Patient Instructions (Addendum)
You have a uti Drink lots of fluids  Take the cipro as directed We will alert you when culture returns  Flu shot today

## 2012-05-01 NOTE — Progress Notes (Signed)
Subjective:    Patient ID: Cheryl Joseph, female    DOB: 04-25-1941, 71 y.o.   MRN: [id number]  HPI 2 weeks of urinary symptoms -- has not had one in years  Feels like bladder spasms  Frequency and urgency Bladder pain  Not a lot of burning   No blood in urine  No fever or n/v occ just a little back pain   ua is very positive   Patient Active Problem List  Diagnosis  . HYPERLIPIDEMIA  . OBESITY  . ALLERGIC RHINITIS, SEASONAL  . ASTHMA  . OVERACTIVE BLADDER  . RHEUMATOID ARTHRITIS  . PAIN IN JOINT, HAND  . KNEE PAIN, RIGHT  . OSTEOPENIA  . HYPERGLYCEMIA, FASTING  . ELEVATED BP W/O HYPERTENSION  . Other screening mammogram  . Skin lesion  . Intertrigo  . Cellulitis of left leg  . UTI (lower urinary tract infection)   Past Medical History  Diagnosis Date  . HPV in female 1996    HPV with colposcopy (all neg paps since)  . Hyperlipidemia   . Asthma   . Osteopenia   . Obesity   . Arthritis     RA hands(seronegative)  . Back pain   . Shoulder pain    Past Surgical History  Procedure Date  . Tonsillectomy    History  Substance Use Topics  . Smoking status: Never Smoker   . Smokeless tobacco: Not on file  . Alcohol Use: No   Family History  Problem Relation Age of Onset  . Diabetes Mother   . Hyperlipidemia Mother   . Heart disease Mother   . Hypertension Mother   . Obesity Mother   . Kyphosis Mother   . Heart disease Father   . Hypertension Father   . Cancer Father     lung Cancer smoker  . Kyphosis Sister   . Diabetes Brother    Allergies  Allergen Reactions  . Ezetimibe-Simvastatin     REACTION: leg pain  . Tolterodine Tartrate     REACTION: side effects  . Vesicare (Solifenacin Succinate)     Eye problems    Current Outpatient Prescriptions on File Prior to Visit  Medication Sig Dispense Refill  . albuterol (PROAIR HFA) 108 (90 BASE) MCG/ACT inhaler Inhale 2 puffs into the lungs every 4 (four) hours as needed for wheezing or  shortness of breath.  1 Inhaler  11  . Calcium Carbonate (CALTRATE 600 PO) Take by mouth as directed.        . Multiple Vitamin (MULTIVITAMIN) tablet Take 1 tablet by mouth daily.        . Omega-3 Fatty Acids (FISH OIL PO) Take 2 capsules by mouth daily.        Marland Kitchen oxybutynin (OXYTROL) 3.9 MG/24HR Place 1 patch onto the skin 2 (two) times a week.  8 patch  12  . simvastatin (ZOCOR) 40 MG tablet Take 1 tablet (40 mg total) by mouth at bedtime.  30 tablet  12  . zafirlukast (ACCOLATE) 10 MG tablet Take 1 tablet (10 mg total) by mouth 2 (two) times daily.  60 tablet  12      Review of Systems    Review of Systems  Constitutional: Negative for fever, appetite change, fatigue and unexpected weight change.  Eyes: Negative for pain and visual disturbance.  Respiratory: Negative for cough and shortness of breath.   Cardiovascular: Negative for cp or palpitations    Gastrointestinal: Negative for nausea, diarrhea and constipation.  Genitourinary: pos for  urgency and frequency. see HPI Skin: Negative for pallor or rash   Neurological: Negative for weakness, light-headedness, numbness and headaches.  Hematological: Negative for adenopathy. Does not bruise/bleed easily.  Psychiatric/Behavioral: Negative for dysphoric mood. The patient is not nervous/anxious.      Objective:   Physical Exam  Constitutional: She appears well-developed and well-nourished. No distress.  HENT:  Head: Normocephalic and atraumatic.  Eyes: Conjunctivae normal and EOM are normal. Pupils are equal, round, and reactive to light.  Neck: Normal range of motion. Neck supple.  Cardiovascular: Normal rate and regular rhythm.   Pulmonary/Chest: Effort normal and breath sounds normal.  Abdominal: Soft. Bowel sounds are normal. She exhibits no distension and no mass. There is tenderness. There is no rebound and no guarding.       Mild suprapubic tenderness  Musculoskeletal:       No cva tenderness   Lymphadenopathy:    She has  no cervical adenopathy.  Neurological: She is alert.  Skin: Skin is warm and dry. No rash noted.  Psychiatric: She has a normal mood and affect.          Assessment & Plan:

## 2012-05-01 NOTE — Assessment & Plan Note (Signed)
With urinary discomfort and frequency tx with cipro cx urine  Fluids encouraged Handout given

## 2012-05-03 LAB — URINE CULTURE

## 2012-05-08 ENCOUNTER — Ambulatory Visit (INDEPENDENT_AMBULATORY_CARE_PROVIDER_SITE_OTHER): Payer: Medicare Other | Admitting: Family Medicine

## 2012-05-08 ENCOUNTER — Encounter: Payer: Self-pay | Admitting: Family Medicine

## 2012-05-08 VITALS — BP 130/74 | HR 62 | Temp 97.9°F | Ht 65.0 in | Wt 200.2 lb

## 2012-05-08 DIAGNOSIS — N390 Urinary tract infection, site not specified: Secondary | ICD-10-CM

## 2012-05-08 LAB — POCT URINALYSIS DIPSTICK
Blood, UA: NEGATIVE
Ketones, UA: NEGATIVE
Leukocytes, UA: NEGATIVE
Protein, UA: NEGATIVE
pH, UA: 6

## 2012-05-08 NOTE — Patient Instructions (Addendum)
Urine is clear now  Drink water and stay away from other beverages for at least 2 weeks  Also avoid spicy food  This will give your bladder a break  If symptoms return- let me know

## 2012-05-08 NOTE — Progress Notes (Signed)
Subjective:    Patient ID: Cheryl Joseph, female    DOB: [birth year], 71 y.o.   MRN: [id number]  HPI Was treated last time with cipro  cx had "insig growth" Still had quite a bit of pain - back and bladder until this past Thursday - that is better now  Bladder spasms are occas -not nearly as bad   She does drink decaf tea She does use stevia   Urine is clear today   Patient Active Problem List  Diagnosis  . HYPERLIPIDEMIA  . OBESITY  . ALLERGIC RHINITIS, SEASONAL  . ASTHMA  . OVERACTIVE BLADDER  . RHEUMATOID ARTHRITIS  . PAIN IN JOINT, HAND  . KNEE PAIN, RIGHT  . OSTEOPENIA  . HYPERGLYCEMIA, FASTING  . ELEVATED BP W/O HYPERTENSION  . Other screening mammogram  . Skin lesion  . Intertrigo  . Cellulitis of left leg  . UTI (lower urinary tract infection)   Past Medical History  Diagnosis Date  . HPV in female 1996    HPV with colposcopy (all neg paps since)  . Hyperlipidemia   . Asthma   . Osteopenia   . Obesity   . Arthritis     RA hands(seronegative)  . Back pain   . Shoulder pain    Past Surgical History  Procedure Date  . Tonsillectomy    History  Substance Use Topics  . Smoking status: Never Smoker   . Smokeless tobacco: Not on file  . Alcohol Use: No   Family History  Problem Relation Age of Onset  . Diabetes Mother   . Hyperlipidemia Mother   . Heart disease Mother   . Hypertension Mother   . Obesity Mother   . Kyphosis Mother   . Heart disease Father   . Hypertension Father   . Cancer Father     lung Cancer smoker  . Kyphosis Sister   . Diabetes Brother    Allergies  Allergen Reactions  . Ezetimibe-Simvastatin     REACTION: leg pain  . Tolterodine Tartrate     REACTION: side effects  . Vesicare (Solifenacin Succinate)     Eye problems    Current Outpatient Prescriptions on File Prior to Visit  Medication Sig Dispense Refill  . albuterol (PROAIR HFA) 108 (90 BASE) MCG/ACT inhaler Inhale 2 puffs into the lungs every 4 (four)  hours as needed for wheezing or shortness of breath.  1 Inhaler  11  . Calcium Carbonate (CALTRATE 600 PO) Take by mouth as directed.        . ciprofloxacin (CIPRO) 250 MG tablet Take 1 tablet (250 mg total) by mouth 2 (two) times daily.  10 tablet  0  . ketoconazole (NIZORAL) 2 % cream Apply topically as needed.      . Multiple Vitamin (MULTIVITAMIN) tablet Take 1 tablet by mouth daily.        . Omega-3 Fatty Acids (FISH OIL PO) Take 2 capsules by mouth daily.        Marland Kitchen oxybutynin (OXYTROL) 3.9 MG/24HR Place 1 patch onto the skin 2 (two) times a week.  8 patch  12  . simvastatin (ZOCOR) 40 MG tablet Take 1 tablet (40 mg total) by mouth at bedtime.  30 tablet  12  . zafirlukast (ACCOLATE) 10 MG tablet Take 1 tablet (10 mg total) by mouth 2 (two) times daily.  60 tablet  12      Review of Systems Review of Systems  Constitutional: Negative for fever, appetite change, fatigue and unexpected  weight change.  Eyes: Negative for pain and visual disturbance.  Respiratory: Negative for cough and shortness of breath.   Cardiovascular: Negative for cp or palpitations    Gastrointestinal: Negative for nausea, diarrhea and constipation.  Genitourinary: Negative for urgency and frequency. (this is improved now ) , neg for hematuria Skin: Negative for pallor or rash   Neurological: Negative for weakness, light-headedness, numbness and headaches.  Hematological: Negative for adenopathy. Does not bruise/bleed easily.  Psychiatric/Behavioral: Negative for dysphoric mood. The patient is not nervous/anxious.         Objective:   Physical Exam  Constitutional: She appears well-developed and well-nourished. No distress.  HENT:  Head: Normocephalic and atraumatic.  Eyes: Conjunctivae normal and EOM are normal. Pupils are equal, round, and reactive to light.  Cardiovascular: Normal rate and regular rhythm.   Pulmonary/Chest: Effort normal and breath sounds normal.  Abdominal: Soft. Bowel sounds are  normal. She exhibits no distension and no mass. There is no tenderness.       No suprapubic tenderness or fullness    Musculoskeletal:       No cva tenderness   Neurological: She is alert. She has normal reflexes.  Skin: Skin is warm and dry. No rash noted. No erythema. No pallor.  Psychiatric: She has a normal mood and affect.          Assessment & Plan:

## 2012-05-08 NOTE — Assessment & Plan Note (Signed)
Now better  Her symptoms lasted longer than the uti and cx was fully positive so this is puzzling We had a talk about giving her bladder a break and drinking water only for a few weeks - she was agreeable Disc ways to avoid uti Will  Continue to monitor

## 2012-06-16 ENCOUNTER — Telehealth (INDEPENDENT_AMBULATORY_CARE_PROVIDER_SITE_OTHER): Payer: Medicare Other | Admitting: Family Medicine

## 2012-06-16 ENCOUNTER — Other Ambulatory Visit (INDEPENDENT_AMBULATORY_CARE_PROVIDER_SITE_OTHER): Payer: Medicare Other

## 2012-06-16 DIAGNOSIS — R030 Elevated blood-pressure reading, without diagnosis of hypertension: Secondary | ICD-10-CM

## 2012-06-16 DIAGNOSIS — E785 Hyperlipidemia, unspecified: Secondary | ICD-10-CM

## 2012-06-16 DIAGNOSIS — R7309 Other abnormal glucose: Secondary | ICD-10-CM

## 2012-06-16 DIAGNOSIS — M899 Disorder of bone, unspecified: Secondary | ICD-10-CM

## 2012-06-16 LAB — LIPID PANEL
Cholesterol: 191 mg/dL (ref 0–200)
HDL: 39.4 mg/dL (ref >39.00)
LDL Cholesterol: 112 mg/dL — ABNORMAL HIGH (ref 0–99)
Triglycerides: 200 mg/dL — ABNORMAL HIGH (ref 0.0–149.0)
VLDL: 40 mg/dL (ref 0.0–40.0)

## 2012-06-16 LAB — CBC WITH DIFFERENTIAL/PLATELET
Basophils Absolute: 0 10*3/uL (ref 0.0–0.1)
Eosinophils Absolute: 0.1 10*3/uL (ref 0.0–0.7)
HCT: 43.9 % (ref 36.0–46.0)
Lymphs Abs: 2.1 10*3/uL (ref 0.7–4.0)
MCHC: 32.8 g/dL (ref 30.0–36.0)
Monocytes Absolute: 0.4 10*3/uL (ref 0.1–1.0)
Monocytes Relative: 7.9 % (ref 3.0–12.0)
Neutro Abs: 3 10*3/uL (ref 1.4–7.7)
Platelets: 218 10*3/uL (ref 150.0–400.0)
RDW: 13.4 % (ref 11.5–14.6)

## 2012-06-16 LAB — COMPREHENSIVE METABOLIC PANEL
ALT: 14 U/L (ref 0–35)
AST: 17 U/L (ref 0–37)
Alkaline Phosphatase: 65 U/L (ref 39–117)
CO2: 28 mEq/L (ref 19–32)
Creatinine, Ser: 0.9 mg/dL (ref 0.4–1.2)
Sodium: 141 mEq/L (ref 135–145)
Total Bilirubin: 0.8 mg/dL (ref 0.3–1.2)
Total Protein: 6.6 g/dL (ref 6.0–8.3)

## 2012-06-16 NOTE — Telephone Encounter (Signed)
Message copied by Judy Pimple on Fri Jun 16, 2012 11:54 AM ------      Message from: Baldomero Lamy      Created: Fri Jun 16, 2012 11:15 AM      Regarding: Had cpx labs this am, please order       Pt came in today for cpx labs, please order.      Thanks      Rodney Booze

## 2012-06-17 LAB — VITAMIN D 25 HYDROXY (VIT D DEFICIENCY, FRACTURES): Vit D, 25-Hydroxy: 36 ng/mL (ref 30–89)

## 2012-06-23 ENCOUNTER — Encounter: Payer: Medicare Other | Admitting: Family Medicine

## 2012-06-27 ENCOUNTER — Ambulatory Visit (INDEPENDENT_AMBULATORY_CARE_PROVIDER_SITE_OTHER): Payer: Medicare Other | Admitting: Family Medicine

## 2012-06-27 ENCOUNTER — Encounter: Payer: Self-pay | Admitting: Family Medicine

## 2012-06-27 VITALS — BP 114/68 | HR 60 | Temp 97.7°F | Ht 62.75 in | Wt 201.2 lb

## 2012-06-27 DIAGNOSIS — E785 Hyperlipidemia, unspecified: Secondary | ICD-10-CM

## 2012-06-27 DIAGNOSIS — R7309 Other abnormal glucose: Secondary | ICD-10-CM

## 2012-06-27 DIAGNOSIS — E669 Obesity, unspecified: Secondary | ICD-10-CM

## 2012-06-27 DIAGNOSIS — J45909 Unspecified asthma, uncomplicated: Secondary | ICD-10-CM

## 2012-06-27 DIAGNOSIS — Z1231 Encounter for screening mammogram for malignant neoplasm of breast: Secondary | ICD-10-CM

## 2012-06-27 MED ORDER — SIMVASTATIN 40 MG PO TABS: 40.0000 mg | ORAL_TABLET | Freq: Every day | ORAL | Status: DC

## 2012-06-27 MED ORDER — ZAFIRLUKAST 10 MG PO TABS: 10.0000 mg | ORAL_TABLET | Freq: Two times a day (BID) | ORAL | Status: DC

## 2012-06-27 NOTE — Assessment & Plan Note (Signed)
Cholesterol up a bit Will work harder on diet Disc goals for lipids and reasons to control them Rev labs with pt Rev low sat fat diet in detail  F/u 6 mo

## 2012-06-27 NOTE — Assessment & Plan Note (Signed)
Discussed how this problem influences overall health and the risks it imposes  Reviewed plan for weight loss with lower calorie diet (via better food choices and also portion control or program like weight watchers) and exercise building up to or more than 30 minutes 5 days per week including some aerobic activity    Pt is finally able to spend time on herself/ exercise and eat right- she is thrilled to get started

## 2012-06-27 NOTE — Progress Notes (Signed)
Subjective:    Patient ID: Cheryl Joseph, female    DOB: [birth year], 71 y.o.   MRN: [id number]  HPI Here for check up of chronic medical conditions and to review health mt list   Has felt very good lately Did retire and really enjoying it !  Now is ready to start working harder on wt loss   Wt is stable with bmi of 35 Is learning how to take time to eat better -not for convenience eat Is walking at the mall 5 d per week Is doing resistance training with video and weights   Also has a partner she enjoys   mammo 1/13 Wants to keep doing those, she can make her own appt  Self exam-no lumps or changes   Pap 10/10 She does not want to do a pap this year  Remote hx of hpv- colp 1996 and all neg paps since  colonosc 11/05- with a 10 year recall   Hyperlipidemia Lab Results  Component Value Date   CHOL 191 06/16/2012   CHOL 174 09/15/2011   CHOL 213* 06/11/2011   Lab Results  Component Value Date   HDL 39.40 06/16/2012   HDL 40.40 09/15/2011   HDL 13.08 06/11/2011   Lab Results  Component Value Date   LDLCALC 112* 06/16/2012   LDLCALC 109* 05/12/2009   LDLCALC 100* 11/11/2008   Lab Results  Component Value Date   TRIG 200.0* 06/16/2012   TRIG 221.0* 09/15/2011   TRIG 214.0* 06/11/2011   Lab Results  Component Value Date   CHOLHDL 5 06/16/2012   CHOLHDL 4 09/15/2011   CHOLHDL 5 06/11/2011   Lab Results  Component Value Date   LDLDIRECT 98.5 09/15/2011   LDLDIRECT 141.3 06/11/2011   LDLDIRECT 110.4 06/01/2010   not bad but up a bit from last year  Plans to work on her diet   Osteopenia dexa 1/12 was mildly improved  Vit D level is 36 Some of her vit D is in her ca and also some extra- not sure how much  Also gets outside more   Hyperglycemia Lab Results  Component Value Date   HGBA1C 5.9 06/16/2012   is much more careful with diet- staying away from simple sugars and getting exercise   Patient Active Problem List  Diagnosis  . HYPERLIPIDEMIA  .  OBESITY  . ALLERGIC RHINITIS, SEASONAL  . ASTHMA  . OVERACTIVE BLADDER  . RHEUMATOID ARTHRITIS  . PAIN IN JOINT, HAND  . KNEE PAIN, RIGHT  . OSTEOPENIA  . HYPERGLYCEMIA, FASTING  . ELEVATED BP W/O HYPERTENSION  . Other screening mammogram  . Skin lesion  . Intertrigo  . Cellulitis of left leg  . UTI (lower urinary tract infection)   Past Medical History  Diagnosis Date  . HPV in female 1996    HPV with colposcopy (all neg paps since)  . Hyperlipidemia   . Asthma   . Osteopenia   . Obesity   . Arthritis     RA hands(seronegative)  . Back pain   . Shoulder pain    Past Surgical History  Procedure Date  . Tonsillectomy    History  Substance Use Topics  . Smoking status: Never Smoker   . Smokeless tobacco: Not on file  . Alcohol Use: No   Family History  Problem Relation Age of Onset  . Diabetes Mother   . Hyperlipidemia Mother   . Heart disease Mother   . Hypertension Mother   . Obesity Mother   .  Kyphosis Mother   . Heart disease Father   . Hypertension Father   . Cancer Father     lung Cancer smoker  . Kyphosis Sister   . Diabetes Brother    Allergies  Allergen Reactions  . Ezetimibe-Simvastatin     REACTION: leg pain  . Tolterodine Tartrate     REACTION: side effects  . Vesicare (Solifenacin Succinate)     Eye problems    Current Outpatient Prescriptions on File Prior to Visit  Medication Sig Dispense Refill  . albuterol (PROAIR HFA) 108 (90 BASE) MCG/ACT inhaler Inhale 2 puffs into the lungs every 4 (four) hours as needed for wheezing or shortness of breath.  1 Inhaler  11  . Calcium Carbonate (CALTRATE 600 PO) Take by mouth as directed.        . Multiple Vitamin (MULTIVITAMIN) tablet Take 1 tablet by mouth daily.        . Omega-3 Fatty Acids (FISH OIL PO) Take 2 capsules by mouth daily.        . simvastatin (ZOCOR) 40 MG tablet Take 1 tablet (40 mg total) by mouth at bedtime.  30 tablet  12  . zafirlukast (ACCOLATE) 10 MG tablet Take 1 tablet  (10 mg total) by mouth 2 (two) times daily.  60 tablet  12  . oxybutynin (OXYTROL) 3.9 MG/24HR Place 1 patch onto the skin 2 (two) times a week.  8 patch  12       Review of Systems Review of Systems  Constitutional: Negative for fever, appetite change, fatigue and unexpected weight change.  Eyes: Negative for pain and visual disturbance.  Respiratory: Negative for cough and shortness of breath.   Cardiovascular: Negative for cp or palpitations    Gastrointestinal: Negative for nausea, diarrhea and constipation.  Genitourinary: Negative for urgency and frequency.  Skin: Negative for pallor or rash   Neurological: Negative for weakness, light-headedness, numbness and headaches.  Hematological: Negative for adenopathy. Does not bruise/bleed easily.  Psychiatric/Behavioral: Negative for dysphoric mood. The patient is not nervous/anxious.         Objective:   Physical Exam  Constitutional: She appears well-developed and well-nourished. No distress.       obese and well appearing   HENT:  Head: Normocephalic and atraumatic.  Right Ear: External ear normal.  Left Ear: External ear normal.  Nose: Nose normal.  Mouth/Throat: Oropharynx is clear and moist. No oropharyngeal exudate.  Eyes: Conjunctivae normal and EOM are normal. Pupils are equal, round, and reactive to light. Right eye exhibits no discharge. Left eye exhibits no discharge. No scleral icterus.  Neck: Normal range of motion. Neck supple. No JVD present. Carotid bruit is not present. No thyromegaly present.  Cardiovascular: Normal rate, regular rhythm, normal heart sounds and intact distal pulses.  Exam reveals no gallop.   Pulmonary/Chest: Effort normal and breath sounds normal. No respiratory distress. She has no wheezes.  Abdominal: Soft. Bowel sounds are normal. She exhibits no distension, no abdominal bruit and no mass. There is no tenderness.  Genitourinary: No breast swelling, tenderness, discharge or bleeding.        Breast exam: No mass, nodules, thickening, tenderness, bulging, retraction, inflamation, nipple discharge or skin changes noted.  No axillary or clavicular LA.  Chaperoned exam.    Musculoskeletal: Normal range of motion. She exhibits no edema and no tenderness.  Lymphadenopathy:    She has no cervical adenopathy.  Neurological: She is alert. She has normal reflexes. No cranial nerve deficit. She exhibits  normal muscle tone. Coordination normal.  Skin: Skin is warm and dry. No rash noted. No erythema. No pallor.  Psychiatric: She has a normal mood and affect.          Assessment & Plan:

## 2012-06-27 NOTE — Assessment & Plan Note (Signed)
Due in jan  Nl breast exam Enc monthly self exams  She will schedule own appt at [facility]

## 2012-06-27 NOTE — Assessment & Plan Note (Signed)
Lab Results  Component Value Date   HGBA1C 5.9 06/16/2012   happy this is under 6 Will continue to work on low glycemic diet and wt loss

## 2012-06-27 NOTE — Patient Instructions (Addendum)
Don't forget you will need a mammogram in jan or feb Avoid red meat/ fried foods/ egg yolks/ fatty breakfast meats/ butter, cheese and high fat dairy/ and shellfish   Try to get 2000 iu of vitamin D daily  Take care of yourself and keep exercising  Follow up in 6 months please

## 2012-06-27 NOTE — Assessment & Plan Note (Signed)
Refilled accolate-no problems  Doing well utd imms

## 2012-07-12 ENCOUNTER — Encounter: Payer: Self-pay | Admitting: Family Medicine

## 2012-07-12 ENCOUNTER — Ambulatory Visit (INDEPENDENT_AMBULATORY_CARE_PROVIDER_SITE_OTHER): Payer: Medicare Other | Admitting: Family Medicine

## 2012-07-12 VITALS — BP 126/74 | HR 65 | Temp 98.0°F | Ht 62.75 in | Wt 204.0 lb

## 2012-07-12 DIAGNOSIS — J069 Acute upper respiratory infection, unspecified: Secondary | ICD-10-CM | POA: Insufficient documentation

## 2012-07-12 MED ORDER — GUAIFENESIN-CODEINE 100-10 MG/5ML PO SYRP: 5.0000 mL | ORAL_SOLUTION | Freq: Four times a day (QID) | ORAL | Status: DC | PRN

## 2012-07-12 NOTE — Progress Notes (Signed)
Subjective:    Patient ID: Cheryl Joseph, female    DOB: [birth year], 71 y.o.   MRN: [id number]  HPI Here with cough and asthma symptoms   Started cold symptoms early this week  Cough is getting deeper - rattling / hears congestion but not bringing any up yet   Wheezing- just a little bit   A bit of headache  Perhaps low grade fever  Post nasal drip and runny nose  Throat is a little scratchy and ears are a bit painful   Yesterday she took some tylenol cold formula   Patient Active Problem List  Diagnosis  . HYPERLIPIDEMIA  . OBESITY  . ALLERGIC RHINITIS, SEASONAL  . ASTHMA  . OVERACTIVE BLADDER  . RHEUMATOID ARTHRITIS  . PAIN IN JOINT, HAND  . KNEE PAIN, RIGHT  . OSTEOPENIA  . HYPERGLYCEMIA, FASTING  . ELEVATED BP W/O HYPERTENSION  . Other screening mammogram  . Skin lesion  . Intertrigo  . Cellulitis of left leg  . UTI (lower urinary tract infection)   Past Medical History  Diagnosis Date  . HPV in female 1996    HPV with colposcopy (all neg paps since)  . Hyperlipidemia   . Asthma   . Osteopenia   . Obesity   . Arthritis     RA hands(seronegative)  . Back pain   . Shoulder pain    Past Surgical History  Procedure Date  . Tonsillectomy    History  Substance Use Topics  . Smoking status: Never Smoker   . Smokeless tobacco: Not on file  . Alcohol Use: No   Family History  Problem Relation Age of Onset  . Diabetes Mother   . Hyperlipidemia Mother   . Heart disease Mother   . Hypertension Mother   . Obesity Mother   . Kyphosis Mother   . Heart disease Father   . Hypertension Father   . Cancer Father     lung Cancer smoker  . Kyphosis Sister   . Diabetes Brother    Allergies  Allergen Reactions  . Ezetimibe-Simvastatin     REACTION: leg pain  . Tolterodine Tartrate     REACTION: side effects  . Vesicare (Solifenacin Succinate)     Eye problems    Current Outpatient Prescriptions on File Prior to Visit  Medication Sig Dispense  Refill  . albuterol (PROAIR HFA) 108 (90 BASE) MCG/ACT inhaler Inhale 2 puffs into the lungs every 4 (four) hours as needed for wheezing or shortness of breath.  1 Inhaler  11  . Calcium Carbonate (CALTRATE 600 PO) Take by mouth as directed.        . Multiple Vitamin (MULTIVITAMIN) tablet Take 1 tablet by mouth daily.        . Omega-3 Fatty Acids (FISH OIL PO) Take 2 capsules by mouth daily.        Marland Kitchen oxybutynin (OXYTROL) 3.9 MG/24HR Place 1 patch onto the skin 2 (two) times a week.  8 patch  12  . simvastatin (ZOCOR) 40 MG tablet Take 1 tablet (40 mg total) by mouth at bedtime.  30 tablet  11  . zafirlukast (ACCOLATE) 10 MG tablet Take 1 tablet (10 mg total) by mouth 2 (two) times daily.  60 tablet  11      Review of Systems Review of Systems  Constitutional: Negative for , appetite change,  and unexpected weight change.  ENT pos for drip/ st /cong and neg for sinus pain  Eyes: Negative for pain and  visual disturbance.  Respiratory: Negative for  shortness of breath.   Cardiovascular: Negative for cp or palpitations    Gastrointestinal: Negative for nausea, diarrhea and constipation.  Genitourinary: Negative for urgency and frequency.  Skin: Negative for pallor or rash   Neurological: Negative for weakness, light-headedness, numbness and headaches.  Hematological: Negative for adenopathy. Does not bruise/bleed easily.  Psychiatric/Behavioral: Negative for dysphoric mood. The patient is not nervous/anxious.         Objective:   Physical Exam  Constitutional: She appears well-developed and well-nourished. No distress.  HENT:  Head: Normocephalic and atraumatic.  Right Ear: External ear normal.  Left Ear: External ear normal.  Mouth/Throat: Oropharynx is clear and moist. No oropharyngeal exudate.       Nares are injected and congested    Eyes: Conjunctivae normal and EOM are normal. Pupils are equal, round, and reactive to light. Right eye exhibits no discharge. Left eye exhibits no  discharge. No scleral icterus.  Neck: Normal range of motion. Neck supple. No JVD present. Carotid bruit is not present. No thyromegaly present.  Cardiovascular: Normal rate, regular rhythm, normal heart sounds and intact distal pulses.  Exam reveals no gallop.   Pulmonary/Chest: Effort normal. No respiratory distress. She has wheezes. She has no rales. She exhibits no tenderness.       Harsh bs Good air exch  Wheeze only on forced exp  No rales/rhonchi  Lymphadenopathy:    She has no cervical adenopathy.  Neurological: She is alert.  Skin: Skin is warm and dry. No rash noted.  Psychiatric: She has a normal mood and affect.          Assessment & Plan:

## 2012-07-12 NOTE — Assessment & Plan Note (Signed)
No wheeze- reassuring exam today Disc symptomatic care - see instructions on AVS  Will try robitussin ac for cough and this may help rest at night  Update if not starting to improve in a week or if worsening  - especially if reactive airways worsen

## 2012-07-12 NOTE — Patient Instructions (Addendum)
You have a viral upper respiratory illness that will need to run its course  Try zyrtec 10 mg daily for runny nose  Tylenol for fever and aches Use you inhaler as needed and update me if wheezing worsens  Get lots of rest and drink fluids  Cough medication - has codeine- it may sedate so use caution

## 2012-08-11 ENCOUNTER — Telehealth: Payer: Self-pay | Admitting: Family Medicine

## 2012-08-11 NOTE — Telephone Encounter (Signed)
Patient Information:  Caller Name: Dennie Bible  Phone: [phone]  Patient: Cheryl Joseph, Cheryl Joseph  Gender: Female  DOB: [birth year]  Age: 72 Years  PCP: Roxy Manns [facility])  Office Follow Up:  Does the office need to follow up with this patient?: Yes  Instructions For The Office: Triage advised to be seen today. Appt book. Please advised. patient is available for work in appt. She declines Saturday appt in [place]. She lives in [place], [place]   Symptoms  Reason For Call & Symptoms: Patient states onset of headache and neck discomfort yesterday 08/10/12.  She believes this is the onset of sinus infection.  Headache located across forehead and back of head. Rated 6/10. Slight runny nose +clear, chills a few times . Both ears ache bilaterally.  No drainage . Allergies to statin but she is taking Zocor**  Reviewed Health History In EMR: Yes  Reviewed Medications In EMR: Yes  Reviewed Allergies In EMR: Yes  Reviewed Surgeries / Procedures: No  Date of Onset of Symptoms: 08/10/2012  Treatments Tried: Tylenol cold  Treatments Tried Worked: No  Guideline(s) Used:  Sinus Pain and Congestion  Disposition Per Guideline:   See Today in Office  Reason For Disposition Reached:   Earache  Advice Given:  Reassurance:   Sinus congestion is a normal part of a cold.  Usually home treatment with nasal washes can prevent an actual bacterial sinus infection.  Antibiotics are not helpful for the sinus congestion that occurs with colds.  Here is some care advice that should help.  For a Runny Nose With Profuse Discharge:  Nasal mucus and discharge helps to wash viruses and bacteria out of the nose and sinuses.  Blowing the nose is all that is needed.  If the skin around your nostrils gets irritated, apply a tiny amount of petroleum ointment to the nasal openings once or twice a day.  For a Stuffy Nose - Use Nasal Washes:  Introduction: Saline (salt water) nasal irrigation (nasal wash) is an  effective and simple home remedy for treating stuffy nose and sinus congestion. The nose can be irrigated by pouring, spraying, or squirting salt water into the nose and then letting it run back out.  How it Helps: The salt water rinses out excess mucus, washes out any irritants (dust, allergens) that might be present, and moistens the nasal cavity.  Methods: There are several ways to perform nasal irrigation. You can use a saline nasal spray bottle (available over-the-counter), a rubber ear syringe, a medical syringe without the needle, or a Neti Pot.  Step-By-Step Instructions:   Step 1: Lean over a sink.  Step 2: Gently squirt or spray warm salt water into one of your nostrils.  Step 3: Some of the water may run into the back of your throat. Spit this out. If you swallow the salt water it will not hurt you.  Step 4: Blow your nose to clean out the water and mucus.  Step 5: Repeat steps 1-4 for the other nostril. You can do this a couple times a day if it seems to help you.  How to Make Saline [facility] Water) Nasal Wash :  You can make your own saline nasal wash.  Add 1/2 tsp of table salt to 1 cup (8 oz; 240 ml) of warm water.  You should use sterile, distilled, or previously boiled water for nasal irrigation.  Hydration:  Drink plenty of liquids (6-8 glasses of water daily). If the air in your home is  dry, use a cool mist humidifier  Hydration:  Drink plenty of liquids (6-8 glasses of water daily). If the air in your home is dry, use a cool mist humidifier  Expected Course:  Sinus congestion from viral upper respiratory infections (colds) usually lasts 5-10 days.  Occasionally a cold can worsen and turn into bacterial sinusitis. Clues to this are sinus symptoms lasting longer than 10 days, fever lasting longer than 3 days, and worsening pain. Bacterial sinusitis may need antibiotic treatment.  Pain and Fever Medicines:  Ibuprofen (e.g., Motrin, Advil):  Take 400 mg (two 200 mg pills) by mouth  every 6 hours.  Pain and Fever Medicines:  For pain or fever relief, take either acetaminophen or ibuprofen.  They are over-the-counter (OTC) drugs that help treat both fever and pain. You can buy them at the drugstore.  Ibuprofen (e.g., Motrin, Advil):  Take 400 mg (two 200 mg pills) by mouth every 6 hours.  Call Back If:   Severe pain lasts longer than 2 hours after pain medicine  Sinus pain lasts longer than 1 day after starting treatment using nasal washes  Sinus congestion (fullness) lasts longer than 10 days  Fever lasts longer than 3 days  You become worse.

## 2012-08-22 ENCOUNTER — Ambulatory Visit (INDEPENDENT_AMBULATORY_CARE_PROVIDER_SITE_OTHER): Payer: Medicare Other | Admitting: Family Medicine

## 2012-08-22 ENCOUNTER — Encounter: Payer: Self-pay | Admitting: Family Medicine

## 2012-08-22 VITALS — BP 128/68 | HR 73 | Temp 98.7°F | Ht 62.75 in | Wt 201.8 lb

## 2012-08-22 DIAGNOSIS — J069 Acute upper respiratory infection, unspecified: Secondary | ICD-10-CM

## 2012-08-22 NOTE — Patient Instructions (Addendum)
You have a viral upper respiratory infection  Continue mucinex and drink lots of fluids and rest  If fever over 101 or increasing productive cough let me know  Nasal saline spray for congestion is helpful also  Tylenol for aches/ fever and sore throat

## 2012-08-22 NOTE — Progress Notes (Signed)
Subjective:    Patient ID: Cheryl Joseph, female    DOB: 1941/07/08, 72 y.o.   MRN: [id number]  HPI Here with uri symptoms   Woke up Sunday with a headache and ST Yesterday hoarse and worse nasal cong and runny Ears hurt   achey this am  Did not take her temp   Cough- is not too bad yet  Dry cough   Did get a flu shot this year   Patient Active Problem List  Diagnosis  . HYPERLIPIDEMIA  . OBESITY  . ALLERGIC RHINITIS, SEASONAL  . ASTHMA  . OVERACTIVE BLADDER  . RHEUMATOID ARTHRITIS  . PAIN IN JOINT, HAND  . KNEE PAIN, RIGHT  . OSTEOPENIA  . HYPERGLYCEMIA, FASTING  . ELEVATED BP W/O HYPERTENSION  . Other screening mammogram  . Skin lesion  . Intertrigo  . Cellulitis of left leg  . UTI (lower urinary tract infection)   Past Medical History  Diagnosis Date  . HPV in female 1996    HPV with colposcopy (all neg paps since)  . Hyperlipidemia   . Asthma   . Osteopenia   . Obesity   . Arthritis     RA hands(seronegative)  . Back pain   . Shoulder pain    Past Surgical History  Procedure Date  . Tonsillectomy    History  Substance Use Topics  . Smoking status: Never Smoker   . Smokeless tobacco: Not on file  . Alcohol Use: No   Family History  Problem Relation Age of Onset  . Diabetes Mother   . Hyperlipidemia Mother   . Heart disease Mother   . Hypertension Mother   . Obesity Mother   . Kyphosis Mother   . Heart disease Father   . Hypertension Father   . Cancer Father     lung Cancer smoker  . Kyphosis Sister   . Diabetes Brother    Allergies  Allergen Reactions  . Ezetimibe-Simvastatin     REACTION: leg pain  . Tolterodine Tartrate     REACTION: side effects  . Vesicare (Solifenacin Succinate)     Eye problems    Current Outpatient Prescriptions on File Prior to Visit  Medication Sig Dispense Refill  . albuterol (PROAIR HFA) 108 (90 BASE) MCG/ACT inhaler Inhale 2 puffs into the lungs every 4 (four) hours as needed for wheezing or  shortness of breath.  1 Inhaler  11  . Calcium Carbonate (CALTRATE 600 PO) Take by mouth as directed.        . Multiple Vitamin (MULTIVITAMIN) tablet Take 1 tablet by mouth daily.        . Omega-3 Fatty Acids (FISH OIL PO) Take 2 capsules by mouth daily.        . simvastatin (ZOCOR) 40 MG tablet Take 1 tablet (40 mg total) by mouth at bedtime.  30 tablet  11  . zafirlukast (ACCOLATE) 10 MG tablet Take 1 tablet (10 mg total) by mouth 2 (two) times daily.  60 tablet  11  . oxybutynin (OXYTROL) 3.9 MG/24HR Place 1 patch onto the skin 2 (two) times a week.  8 patch  12       Review of Systems Review of Systems  Constitutional: Negative for fever, appetite change,  and unexpected weight change.  ENT pos for cong and rhinorrhea and ST, neg for sinus pain  Eyes: Negative for pain and visual disturbance.  Respiratory: Negative for wheeze and shortness of breath.   Cardiovascular: Negative for cp or palpitations  Gastrointestinal: Negative for nausea, diarrhea and constipation.  Genitourinary: Negative for urgency and frequency.  Skin: Negative for pallor or rash   Neurological: Negative for weakness, light-headedness, numbness and headaches.  Hematological: Negative for adenopathy. Does not bruise/bleed easily.  Psychiatric/Behavioral: Negative for dysphoric mood. The patient is not nervous/anxious.         Objective:   Physical Exam  Constitutional: She appears well-developed and well-nourished. No distress.  HENT:  Head: Normocephalic and atraumatic.  Right Ear: External ear normal.  Left Ear: External ear normal.  Mouth/Throat: Oropharynx is clear and moist. No oropharyngeal exudate.       Nares are injected and congested   No sinus tenderness  Eyes: Conjunctivae normal and EOM are normal. Pupils are equal, round, and reactive to light. Right eye exhibits no discharge. Left eye exhibits no discharge.  Neck: Normal range of motion. Neck supple. No thyromegaly present.    Cardiovascular: Normal rate, regular rhythm and normal heart sounds.   Pulmonary/Chest: Effort normal and breath sounds normal. No respiratory distress. She has no wheezes. She has no rales.  Lymphadenopathy:    She has no cervical adenopathy.  Neurological: She is alert.  Skin: Skin is warm and dry. No rash noted.  Psychiatric: She has a normal mood and affect.          Assessment & Plan:

## 2012-08-22 NOTE — Assessment & Plan Note (Signed)
Disc symptomatic care - see instructions on AVS  Mucinex/nasal saline/ tylenol Reassuring exam  Disc resp symptoms to watch out for and update if worse

## 2012-09-21 ENCOUNTER — Ambulatory Visit: Payer: Self-pay | Admitting: Family Medicine

## 2012-09-22 ENCOUNTER — Encounter: Payer: Self-pay | Admitting: Family Medicine

## 2012-09-25 ENCOUNTER — Encounter: Payer: Self-pay | Admitting: *Deleted

## 2012-12-20 ENCOUNTER — Encounter: Payer: Self-pay | Admitting: Family Medicine

## 2012-12-20 ENCOUNTER — Ambulatory Visit (INDEPENDENT_AMBULATORY_CARE_PROVIDER_SITE_OTHER): Payer: Medicare Other | Admitting: Family Medicine

## 2012-12-20 VITALS — BP 130/80 | HR 57 | Temp 97.7°F | Ht 62.75 in | Wt 197.5 lb

## 2012-12-20 DIAGNOSIS — M25551 Pain in right hip: Secondary | ICD-10-CM

## 2012-12-20 DIAGNOSIS — M25559 Pain in unspecified hip: Secondary | ICD-10-CM

## 2012-12-20 NOTE — Progress Notes (Signed)
Nature conservation officer at [facility] [address] [place] [address] Phone: [phone] Fax: [phone]  Date:  12/20/2012   Name:  Cheryl Joseph   DOB:  February 04, 1941   MRN:  [id number] Gender: female Age: 72 y.o.  Primary Physician:  Roxy Manns, MD  Evaluating MD: Hannah Beat, MD   Chief Complaint: Hip Pain   History of Present Illness:  Cheryl Joseph is a 72 y.o. pleasant patient who presents with the following:  Right lateral hip has been bothering her. Limps some with walking. Had some bursitis in her Right hip. Getting married in 2 weeks.  Last week has taken 400 mg of ibuprofen which has helped a lot.  Posterior buttocks.  Sometimes will have pain down to the thigh.  No groin pain.  No back pain.   Patient Active Problem List   Diagnosis Date Noted  . Viral URI 08/22/2012  . UTI (lower urinary tract infection) 05/01/2012  . Cellulitis of left leg 02/28/2012  . Other screening mammogram 06/22/2011  . Skin lesion 06/22/2011  . Intertrigo 06/22/2011  . KNEE PAIN, RIGHT 06/03/2010  . HYPERGLYCEMIA, FASTING 05/14/2009  . RHEUMATOID ARTHRITIS 05/13/2008  . ELEVATED BP W/O HYPERTENSION 05/13/2008  . PAIN IN JOINT, HAND 06/13/2007  . OSTEOPENIA 03/07/2007  . HYPERLIPIDEMIA 01/03/2007  . OBESITY 01/03/2007  . ALLERGIC RHINITIS, SEASONAL 01/03/2007  . ASTHMA 01/03/2007  . OVERACTIVE BLADDER 01/03/2007    Past Medical History  Diagnosis Date  . HPV in female 1996    HPV with colposcopy (all neg paps since)  . Hyperlipidemia   . Asthma   . Osteopenia   . Obesity   . Arthritis     RA hands(seronegative)  . Back pain   . Shoulder pain     Past Surgical History  Procedure Laterality Date  . Tonsillectomy      History   Social History  . Marital Status: Widowed    Spouse Name: N/A    Number of Children: N/A  . Years of Education: N/A   Occupational History  . Not on file.   Social History Main Topics  . Smoking status: Never  Smoker   . Smokeless tobacco: Not on file  . Alcohol Use: No  . Drug Use: No  . Sexually Active: Not on file   Other Topics Concern  . Not on file   Social History Narrative  . No narrative on file    Family History  Problem Relation Age of Onset  . Diabetes Mother   . Hyperlipidemia Mother   . Heart disease Mother   . Hypertension Mother   . Obesity Mother   . Kyphosis Mother   . Heart disease Father   . Hypertension Father   . Cancer Father     lung Cancer smoker  . Kyphosis Sister   . Diabetes Brother     Allergies  Allergen Reactions  . Ezetimibe-Simvastatin     REACTION: leg pain  . Tolterodine Tartrate     REACTION: side effects  . Vesicare (Solifenacin Succinate)     Eye problems     Medication list has been reviewed and updated.  Outpatient Prescriptions Prior to Visit  Medication Sig Dispense Refill  . albuterol (PROAIR HFA) 108 (90 BASE) MCG/ACT inhaler Inhale 2 puffs into the lungs every 4 (four) hours as needed for wheezing or shortness of breath.  1 Inhaler  11  . Calcium Carbonate (CALTRATE 600 PO) Take by mouth as directed.        Marland Kitchen  Multiple Vitamin (MULTIVITAMIN) tablet Take 1 tablet by mouth daily.        . Omega-3 Fatty Acids (FISH OIL PO) Take 2 capsules by mouth daily.        . simvastatin (ZOCOR) 40 MG tablet Take 1 tablet (40 mg total) by mouth at bedtime.  30 tablet  11  . zafirlukast (ACCOLATE) 10 MG tablet Take 1 tablet (10 mg total) by mouth 2 (two) times daily.  60 tablet  11  . oxybutynin (OXYTROL) 3.9 MG/24HR Place 1 patch onto the skin 2 (two) times a week.  8 patch  12  . guaiFENesin-dextromethorphan (ROBITUSSIN DM) 100-10 MG/5ML syrup Take 5 mLs by mouth 3 (three) times daily as needed.       No facility-administered medications prior to visit.    Review of Systems:   GEN: No fevers, chills. Nontoxic. Primarily MSK c/o today. MSK: Detailed in the HPI GI: tolerating PO intake without difficulty Neuro: No numbness,  parasthesias, or tingling associated. Otherwise the pertinent positives of the ROS are noted above.    Physical Examination: BP 130/80  Pulse 57  Temp(Src) 97.7 F (36.5 C) (Oral)  Ht 5' 2.75" (1.594 m)  Wt 197 lb 8 oz (89.585 kg)  BMI 35.26 kg/m2  SpO2 96%  Ideal Body Weight: Weight in (lb) to have BMI = 25: 139.7   GEN: WDWN, NAD, Non-toxic, Alert & Oriented x 3 HEENT: Atraumatic, Normocephalic.  Ears and Nose: No external deformity. EXTR: No clubbing/cyanosis/edema NEURO: Normal gait.  PSYCH: Normally interactive. Conversant. Not depressed or anxious appearing.  Calm demeanor.   HIP EXAM: SIDE: r ROM: Abduction, Flexion, Internal and External range of motion: full Pain with terminal IROM and EROM: none GTB: minimal SLR: NEG Knees: No effusion FABER: NT REVERSE FABER: NT, neg Piriformis: NT at direct palpation Str: flexion: 5/5 abduction: 4/5 adduction: 4++/5 Strength testing non-tender     Assessment and Plan: Hip pain, right   >25 minutes spent in face to face time with patient, >50% spent in counselling or coordination of care  R hip markedly weak, L hip also weak - with abductors. Great ROM. Not intraarticular process. Less likely spinal. Likely also secondary pelvic rim bursitis. Will need longstanding hip rehab - given aaos program.  Signed, Karleen Hampshire T. Daiel Strohecker, MD 12/20/2012 9:50 AM

## 2012-12-26 ENCOUNTER — Ambulatory Visit (INDEPENDENT_AMBULATORY_CARE_PROVIDER_SITE_OTHER): Payer: Medicare Other | Admitting: Family Medicine

## 2012-12-26 ENCOUNTER — Ambulatory Visit: Payer: Medicare Other | Admitting: Family Medicine

## 2012-12-26 ENCOUNTER — Encounter: Payer: Self-pay | Admitting: Family Medicine

## 2012-12-26 VITALS — BP 122/84 | HR 63 | Temp 98.2°F | Ht 62.75 in | Wt 199.8 lb

## 2012-12-26 DIAGNOSIS — R7309 Other abnormal glucose: Secondary | ICD-10-CM

## 2012-12-26 DIAGNOSIS — E669 Obesity, unspecified: Secondary | ICD-10-CM

## 2012-12-26 DIAGNOSIS — E785 Hyperlipidemia, unspecified: Secondary | ICD-10-CM

## 2012-12-26 LAB — LIPID PANEL
Cholesterol: 190 mg/dL (ref 0–200)
Total CHOL/HDL Ratio: 5
VLDL: 57.6 mg/dL — ABNORMAL HIGH (ref 0.0–40.0)

## 2012-12-26 LAB — HEMOGLOBIN A1C: Hgb A1c MFr Bld: 5.8 % (ref 4.6–6.5)

## 2012-12-26 MED ORDER — ALBUTEROL SULFATE HFA 108 (90 BASE) MCG/ACT IN AERS: 2.0000 | INHALATION_SPRAY | RESPIRATORY_TRACT | Status: DC | PRN

## 2012-12-26 NOTE — Assessment & Plan Note (Signed)
Discussed how this problem influences overall health and the risks it imposes  Reviewed plan for weight loss with lower calorie diet (via better food choices and also portion control or program like weight watchers) and exercise building up to or more than 30 minutes 5 days per week including some aerobic activity    Pt is struggling with this - decided to cut wt watchers points to 21 and see if this jump starts things Does well with exercise  Continue to encourage her

## 2012-12-26 NOTE — Assessment & Plan Note (Signed)
A1c today-expect stability Disc need for wt loss to prev DM  Rev low glycemic diet-pt does very well with that and exercise

## 2012-12-26 NOTE — Progress Notes (Signed)
Subjective:    Patient ID: Cheryl Joseph, female    DOB: June 05, 1941, 72 y.o.   MRN: [id number]  HPI Here for f/u of chronic medical problems   Is feeling good overall   Working on exercises for her hip   Hyperlipidemia  Lab Results  Component Value Date   CHOL 191 06/16/2012   HDL 39.40 06/16/2012   LDLCALC 112* 06/16/2012   LDLDIRECT 98.5 09/15/2011   TRIG 200.0* 06/16/2012   CHOLHDL 5 06/16/2012   On zocor and diet  This last check was up a bit   Hyperglycemia Lab Results  Component Value Date   HGBA1C 5.9 06/16/2012    Due for a check  Wt is up 2 lb with bmi of 35 Exercise - is walking and doing other things -- walks at the mall 20 min and DVD for 15-20 minutes also  She uses the weight watchers program - her points are 24 for a day -sticks with it  She also very much avoids high sugar items   She is getting married in 2 weeks- excited for that   Wt loss for her is such a struggle - but she will not give up   Needs refil of proair- does not use often   Patient Active Problem List   Diagnosis Date Noted  . Other screening mammogram 06/22/2011  . Skin lesion 06/22/2011  . Intertrigo 06/22/2011  . KNEE PAIN, RIGHT 06/03/2010  . HYPERGLYCEMIA, FASTING 05/14/2009  . RHEUMATOID ARTHRITIS 05/13/2008  . ELEVATED BP W/O HYPERTENSION 05/13/2008  . PAIN IN JOINT, HAND 06/13/2007  . OSTEOPENIA 03/07/2007  . HYPERLIPIDEMIA 01/03/2007  . OBESITY 01/03/2007  . ALLERGIC RHINITIS, SEASONAL 01/03/2007  . ASTHMA 01/03/2007  . OVERACTIVE BLADDER 01/03/2007   Past Medical History  Diagnosis Date  . HPV in female 1996    HPV with colposcopy (all neg paps since)  . Hyperlipidemia   . Asthma   . Osteopenia   . Obesity   . Arthritis     RA hands(seronegative)  . Back pain   . Shoulder pain    Past Surgical History  Procedure Laterality Date  . Tonsillectomy     History  Substance Use Topics  . Smoking status: Never Smoker   . Smokeless tobacco: Not on file   . Alcohol Use: No   Family History  Problem Relation Age of Onset  . Diabetes Mother   . Hyperlipidemia Mother   . Heart disease Mother   . Hypertension Mother   . Obesity Mother   . Kyphosis Mother   . Heart disease Father   . Hypertension Father   . Cancer Father     lung Cancer smoker  . Kyphosis Sister   . Diabetes Brother    Allergies  Allergen Reactions  . Ezetimibe-Simvastatin     REACTION: leg pain  . Tolterodine Tartrate     REACTION: side effects  . Vesicare (Solifenacin Succinate)     Eye problems    Current Outpatient Prescriptions on File Prior to Visit  Medication Sig Dispense Refill  . albuterol (PROAIR HFA) 108 (90 BASE) MCG/ACT inhaler Inhale 2 puffs into the lungs every 4 (four) hours as needed for wheezing or shortness of breath.  1 Inhaler  11  . Calcium Carbonate (CALTRATE 600 PO) Take by mouth as directed.        . Multiple Vitamin (MULTIVITAMIN) tablet Take 1 tablet by mouth daily.        . Omega-3 Fatty Acids (FISH  OIL PO) Take 2 capsules by mouth daily.        Marland Kitchen oxybutynin (OXYTROL) 3.9 MG/24HR Place 1 patch onto the skin 2 (two) times a week.  8 patch  12  . simvastatin (ZOCOR) 40 MG tablet Take 1 tablet (40 mg total) by mouth at bedtime.  30 tablet  11  . zafirlukast (ACCOLATE) 10 MG tablet Take 1 tablet (10 mg total) by mouth 2 (two) times daily.  60 tablet  11   No current facility-administered medications on file prior to visit.      Review of Systems Review of Systems  Constitutional: Negative for fever, appetite change, fatigue and unexpected weight change.  Eyes: Negative for pain and visual disturbance.  Respiratory: Negative for cough and shortness of breath.   Cardiovascular: Negative for cp or palpitations    Gastrointestinal: Negative for nausea, diarrhea and constipation.  Genitourinary: Negative for urgency and frequency.  Skin: Negative for pallor or rash   Neurological: Negative for weakness, light-headedness, numbness and  headaches.  Hematological: Negative for adenopathy. Does not bruise/bleed easily.  Psychiatric/Behavioral: Negative for dysphoric mood. The patient is not nervous/anxious.         Objective:   Physical Exam  Constitutional: She appears well-developed and well-nourished. No distress.  obese and well appearing   HENT:  Head: Normocephalic and atraumatic.  Mouth/Throat: Oropharynx is clear and moist.  Eyes: Conjunctivae and EOM are normal. Pupils are equal, round, and reactive to light. Right eye exhibits no discharge. Left eye exhibits no discharge. No scleral icterus.  Neck: Normal range of motion. Neck supple. No JVD present. Carotid bruit is not present. No thyromegaly present.  Cardiovascular: Normal rate, regular rhythm, normal heart sounds and intact distal pulses.  Exam reveals no gallop.   Pulmonary/Chest: Breath sounds normal. No respiratory distress. She has no wheezes. She has no rales.  Abdominal: Soft. Bowel sounds are normal. She exhibits no distension, no abdominal bruit and no mass. There is no tenderness.  Musculoskeletal: She exhibits no edema.  Lymphadenopathy:    She has no cervical adenopathy.  Neurological: She is alert.  Skin: Skin is warm and dry. No rash noted. No pallor.  Psychiatric: She has a normal mood and affect.          Assessment & Plan:

## 2012-12-26 NOTE — Patient Instructions (Addendum)
Keep working on diet and exercise for weight loss You may need to cut your weight watchers points by 3 to see some results- do not give up  Avoid red meat/ fried foods/ egg yolks/ fatty breakfast meats/ butter, cheese and high fat dairy/ and shellfish  Also keep watching sugar in diet  Follow up in about 6 months for annual exam with labs prior

## 2012-12-26 NOTE — Assessment & Plan Note (Signed)
Lipids today Expect imp with better diet  Also statin -tolerates well Rev low sat fat diet

## 2012-12-27 ENCOUNTER — Encounter: Payer: Self-pay | Admitting: *Deleted

## 2013-01-05 ENCOUNTER — Ambulatory Visit: Payer: Medicare Other | Admitting: Family Medicine

## 2013-02-21 ENCOUNTER — Ambulatory Visit (INDEPENDENT_AMBULATORY_CARE_PROVIDER_SITE_OTHER): Payer: Medicare Other | Admitting: Family Medicine

## 2013-02-21 ENCOUNTER — Encounter: Payer: Self-pay | Admitting: Family Medicine

## 2013-02-21 VITALS — BP 124/76 | HR 64 | Temp 98.0°F | Wt 185.2 lb

## 2013-02-21 DIAGNOSIS — M25569 Pain in unspecified knee: Secondary | ICD-10-CM

## 2013-02-21 DIAGNOSIS — M25561 Pain in right knee: Secondary | ICD-10-CM

## 2013-02-21 NOTE — Patient Instructions (Signed)
Ibuprofen 3 tablets, 3 times a day Tylenol 2 tablets, 3 times a day

## 2013-02-21 NOTE — Progress Notes (Signed)
Nature conservation officer at [facility] [address] [place] [address] Phone: [phone] Fax: [phone]  Date:  02/21/2013   Name:  Cheryl Joseph   DOB:  Jun 10, 1941   MRN:  [id number] Gender: female Age: 72 y.o.  Primary Physician:  Roxy Manns, MD  Evaluating MD: Hannah Beat, MD   Chief Complaint: rt knee pain   History of Present Illness:  Cheryl Joseph is a 72 y.o. pleasant patient who presents with the following:  Last Tuesday, sat and did for a long time. Felt like could not walk. Had some swelling on the lateral aspect of R knee. When got in the bed, difficult. No mechanical buckling or locking up of the joint. No symptomatic giving way. She has not tried much to make it better. Getting a little bit better since it started.     Patient Active Problem List   Diagnosis Date Noted  . Other screening mammogram 06/22/2011  . Intertrigo 06/22/2011  . KNEE PAIN, RIGHT 06/03/2010  . HYPERGLYCEMIA, FASTING 05/14/2009  . RHEUMATOID ARTHRITIS 05/13/2008  . ELEVATED BP W/O HYPERTENSION 05/13/2008  . PAIN IN JOINT, HAND 06/13/2007  . OSTEOPENIA 03/07/2007  . HYPERLIPIDEMIA 01/03/2007  . OBESITY 01/03/2007  . ALLERGIC RHINITIS, SEASONAL 01/03/2007  . ASTHMA 01/03/2007  . OVERACTIVE BLADDER 01/03/2007    Past Medical History  Diagnosis Date  . HPV in female 1996    HPV with colposcopy (all neg paps since)  . Hyperlipidemia   . Asthma   . Osteopenia   . Obesity   . Arthritis     RA hands(seronegative)  . Back pain   . Shoulder pain     Past Surgical History  Procedure Laterality Date  . Tonsillectomy      History   Social History  . Marital Status: Widowed    Spouse Name: N/A    Number of Children: N/A  . Years of Education: N/A   Occupational History  . Not on file.   Social History Main Topics  . Smoking status: Never Smoker   . Smokeless tobacco: Not on file  . Alcohol Use: No  . Drug Use: No  . Sexually Active: Not on file    Other Topics Concern  . Not on file   Social History Narrative  . No narrative on file    Family History  Problem Relation Age of Onset  . Diabetes Mother   . Hyperlipidemia Mother   . Heart disease Mother   . Hypertension Mother   . Obesity Mother   . Kyphosis Mother   . Heart disease Father   . Hypertension Father   . Cancer Father     lung Cancer smoker  . Kyphosis Sister   . Diabetes Brother     Allergies  Allergen Reactions  . Ezetimibe-Simvastatin     REACTION: leg pain  . Tolterodine Tartrate     REACTION: side effects  . Vesicare (Solifenacin Succinate)     Eye problems     Medication list has been reviewed and updated.  Outpatient Prescriptions Prior to Visit  Medication Sig Dispense Refill  . Calcium Carbonate (CALTRATE 600 PO) Take by mouth as directed.        . Omega-3 Fatty Acids (FISH OIL PO) Take 2 capsules by mouth daily.        . simvastatin (ZOCOR) 40 MG tablet Take 1 tablet (40 mg total) by mouth at bedtime.  30 tablet  11  . zafirlukast (ACCOLATE) 10 MG  tablet Take 1 tablet (10 mg total) by mouth 2 (two) times daily.  60 tablet  11  . albuterol (PROAIR HFA) 108 (90 BASE) MCG/ACT inhaler Inhale 2 puffs into the lungs every 4 (four) hours as needed for wheezing or shortness of breath.  1 Inhaler  11  . Multiple Vitamin (MULTIVITAMIN) tablet Take 1 tablet by mouth daily.        Marland Kitchen oxybutynin (OXYTROL) 3.9 MG/24HR Place 1 patch onto the skin 2 (two) times a week.  8 patch  12   No facility-administered medications prior to visit.    Review of Systems:   GEN: No fevers, chills. Nontoxic. Primarily MSK c/o today. MSK: Detailed in the HPI GI: tolerating PO intake without difficulty Neuro: No numbness, parasthesias, or tingling associated. Otherwise the pertinent positives of the ROS are noted above.    Physical Examination: BP 124/76  Pulse 64  Temp(Src) 98 F (36.7 C) (Oral)  Wt 185 lb 4 oz (84.029 kg)  BMI 33.07 kg/m2  SpO2  95%  Ideal Body Weight:     GEN: WDWN, NAD, Non-toxic, Alert & Oriented x 3 HEENT: Atraumatic, Normocephalic.  Ears and Nose: No external deformity. EXTR: No clubbing/cyanosis/edema NEURO: Normal gait.  PSYCH: Normally interactive. Conversant. Not depressed or anxious appearing.  Calm demeanor.   Knee:  B Gait: Normal heel toe pattern ROM: 0-125 Effusion: neg Echymosis or edema: none Patellar tendon NT Painful PLICA: neg Patellar grind: negative Medial and lateral patellar facet loading: negative medial and lateral joint lines: mild R joint line pain Mcmurray's neg Flexion-pinch neg Varus and valgus stress: stable Lachman: neg Ant and Post drawer: neg Hip abduction, IR, ER: WNL Hip flexion str: 5/5 Hip abd: 5/5 Quad: 5/5 VMO atrophy:No Hamstring concentric and eccentric: 5/5   Assessment and Plan:  Right knee pain  Low level concern, start with Alleve, tylenol, ice, gentle return to activities  Orders Today:  No orders of the defined types were placed in this encounter.    Updated Medication List: (Includes new medications, updates to list, dose adjustments) No orders of the defined types were placed in this encounter.    Medications Discontinued: There are no discontinued medications.    Signed, Elpidio Galea. Miabella Shannahan, MD 02/21/2013 9:59 AM

## 2013-03-15 ENCOUNTER — Ambulatory Visit: Payer: Self-pay | Admitting: Ophthalmology

## 2013-03-15 DIAGNOSIS — I499 Cardiac arrhythmia, unspecified: Secondary | ICD-10-CM

## 2013-03-27 ENCOUNTER — Ambulatory Visit: Payer: Self-pay | Admitting: Ophthalmology

## 2013-05-16 ENCOUNTER — Ambulatory Visit: Payer: Medicare Other

## 2013-05-17 ENCOUNTER — Ambulatory Visit: Payer: Medicare Other

## 2013-05-21 ENCOUNTER — Ambulatory Visit (INDEPENDENT_AMBULATORY_CARE_PROVIDER_SITE_OTHER): Payer: Medicare Other

## 2013-05-21 DIAGNOSIS — Z23 Encounter for immunization: Secondary | ICD-10-CM

## 2013-06-12 ENCOUNTER — Ambulatory Visit: Payer: Self-pay | Admitting: Ophthalmology

## 2013-07-09 ENCOUNTER — Telehealth: Payer: Self-pay | Admitting: Family Medicine

## 2013-07-09 DIAGNOSIS — E785 Hyperlipidemia, unspecified: Secondary | ICD-10-CM

## 2013-07-09 DIAGNOSIS — M899 Disorder of bone, unspecified: Secondary | ICD-10-CM

## 2013-07-09 DIAGNOSIS — R7309 Other abnormal glucose: Secondary | ICD-10-CM

## 2013-07-09 DIAGNOSIS — R030 Elevated blood-pressure reading, without diagnosis of hypertension: Secondary | ICD-10-CM

## 2013-07-09 NOTE — Telephone Encounter (Signed)
Message copied by Judy Pimple on Mon Jul 09, 2013  4:24 PM ------      Message from: Alvina Chou      Created: Wed Jul 04, 2013  5:14 PM      Regarding: Lab orders for Wednesday, 12.17.14       Patient is scheduled for CPX labs, please order future labs, Thanks , Terri       ------

## 2013-07-09 NOTE — Telephone Encounter (Signed)
Message copied by Judy Pimple on Mon Jul 09, 2013  4:03 PM ------      Message from: Alvina Chou      Created: Fri Jun 29, 2013  5:06 PM      Regarding: Lab orders for Tuesday, 12.16.14       Patient is scheduled for CPX labs, please order future labs, Thanks , Terri       ------

## 2013-07-10 ENCOUNTER — Other Ambulatory Visit: Payer: Medicare Other

## 2013-07-11 ENCOUNTER — Other Ambulatory Visit (INDEPENDENT_AMBULATORY_CARE_PROVIDER_SITE_OTHER): Payer: Medicare Other

## 2013-07-11 DIAGNOSIS — R030 Elevated blood-pressure reading, without diagnosis of hypertension: Secondary | ICD-10-CM

## 2013-07-11 DIAGNOSIS — E785 Hyperlipidemia, unspecified: Secondary | ICD-10-CM

## 2013-07-11 DIAGNOSIS — M899 Disorder of bone, unspecified: Secondary | ICD-10-CM

## 2013-07-11 DIAGNOSIS — R7309 Other abnormal glucose: Secondary | ICD-10-CM

## 2013-07-11 LAB — COMPREHENSIVE METABOLIC PANEL
Albumin: 3.6 g/dL (ref 3.5–5.2)
CO2: 29 mEq/L (ref 19–32)
Calcium: 8.7 mg/dL (ref 8.4–10.5)
Chloride: 109 mEq/L (ref 96–112)
GFR: 80.4 mL/min (ref >60.00)
Glucose, Bld: 117 mg/dL — ABNORMAL HIGH (ref 70–99)
Sodium: 142 mEq/L (ref 135–145)
Total Bilirubin: 0.5 mg/dL (ref 0.3–1.2)
Total Protein: 6.4 g/dL (ref 6.0–8.3)

## 2013-07-11 LAB — CBC WITH DIFFERENTIAL/PLATELET
Basophils Absolute: 0 10*3/uL (ref 0.0–0.1)
Eosinophils Relative: 3.9 % (ref 0.0–5.0)
HCT: 41.4 % (ref 36.0–46.0)
Hemoglobin: 13.9 g/dL (ref 12.0–15.0)
Lymphs Abs: 1.9 10*3/uL (ref 0.7–4.0)
MCHC: 33.6 g/dL (ref 30.0–36.0)
Monocytes Absolute: 0.5 10*3/uL (ref 0.1–1.0)
Monocytes Relative: 8.8 % (ref 3.0–12.0)
Neutro Abs: 2.8 10*3/uL (ref 1.4–7.7)
Platelets: 225 10*3/uL (ref 150.0–400.0)
RBC: 4.45 Mil/uL (ref 3.87–5.11)
WBC: 5.5 10*3/uL (ref 4.5–10.5)

## 2013-07-11 LAB — TSH: TSH: 2.45 u[IU]/mL (ref 0.35–5.50)

## 2013-07-11 LAB — LIPID PANEL
Cholesterol: 187 mg/dL (ref 0–200)
HDL: 40.2 mg/dL (ref >39.00)
VLDL: 39.4 mg/dL (ref 0.0–40.0)

## 2013-07-11 LAB — HEMOGLOBIN A1C: Hgb A1c MFr Bld: 5.9 % (ref 4.6–6.5)

## 2013-07-12 LAB — VITAMIN D 25 HYDROXY (VIT D DEFICIENCY, FRACTURES): Vit D, 25-Hydroxy: 41 ng/mL (ref 30–89)

## 2013-07-16 ENCOUNTER — Encounter: Payer: Self-pay | Admitting: Family Medicine

## 2013-07-16 ENCOUNTER — Ambulatory Visit (INDEPENDENT_AMBULATORY_CARE_PROVIDER_SITE_OTHER): Payer: Medicare Other | Admitting: Family Medicine

## 2013-07-16 VITALS — BP 106/66 | HR 66 | Temp 97.7°F | Ht 63.0 in | Wt 199.2 lb

## 2013-07-16 DIAGNOSIS — M899 Disorder of bone, unspecified: Secondary | ICD-10-CM

## 2013-07-16 DIAGNOSIS — E785 Hyperlipidemia, unspecified: Secondary | ICD-10-CM

## 2013-07-16 DIAGNOSIS — R7309 Other abnormal glucose: Secondary | ICD-10-CM

## 2013-07-16 DIAGNOSIS — E669 Obesity, unspecified: Secondary | ICD-10-CM

## 2013-07-16 DIAGNOSIS — Z0000 Encounter for general adult medical examination without abnormal findings: Secondary | ICD-10-CM | POA: Insufficient documentation

## 2013-07-16 MED ORDER — ZAFIRLUKAST 10 MG PO TABS: 10.0000 mg | ORAL_TABLET | Freq: Two times a day (BID) | ORAL | Status: DC

## 2013-07-16 MED ORDER — SIMVASTATIN 40 MG PO TABS: 40.0000 mg | ORAL_TABLET | Freq: Every day | ORAL | Status: DC

## 2013-07-16 NOTE — Assessment & Plan Note (Signed)
Discussed how this problem influences overall health and the risks it imposes  Reviewed plan for weight loss with lower calorie diet (via better food choices and also portion control or program like weight watchers) and exercise building up to or more than 30 minutes 5 days per week including some aerobic activity    

## 2013-07-16 NOTE — Progress Notes (Signed)
Pre-visit discussion using our clinic review tool. No additional management support is needed unless otherwise documented below in the visit note.  

## 2013-07-16 NOTE — Assessment & Plan Note (Signed)
Reviewed health habits including diet and exercise and skin cancer prevention Reviewed appropriate screening tests for age  Also reviewed health mt list, fam hx and immunization status , as well as social and family history   See HPI Labs reviewed  

## 2013-07-16 NOTE — Assessment & Plan Note (Signed)
Schedule 2 year dexa  No falls or fx  Disc need for calcium/ vitamin D/ wt bearing exercise and bone density test every 2 y to monitor Disc safety/ fracture risk in detail

## 2013-07-16 NOTE — Patient Instructions (Signed)
Think about working on a living will  Stop up front for referral for a bone density test  Call your insurance to get a list of asthma medicines that are affordable to you   ( a maintenance inhaler may be helpful)   Keep working on low fat and low sugar diet

## 2013-07-16 NOTE — Progress Notes (Signed)
Subjective:    Patient ID: Cheryl Joseph, female    DOB: [birth year], 72 y.o.   MRN: [id number]  HPI I have personally reviewed the Medicare Annual Wellness questionnaire and have noted 1. The patient's medical and social history 2. Their use of alcohol, tobacco or illicit drugs 3. Their current medications and supplements 4. The patient's functional ability including ADL's, fall risks, home safety risks and hearing or visual             impairment. 5. Diet and physical activities 6. Evidence for depression or mood disorders  The patients weight, height, BMI have been recorded in the chart and visual acuity is per eye clinic.  I have made referrals, counseling and provided education to the patient based review of the above and I have provided the pt with a written personalized care plan for preventive services.  Doing well overall   See scanned forms.  Routine anticipatory guidance given to patient.  See health maintenance. Flu-imm in oct 10/14 Shingles 8/09 vaccine  PNA 10/06  Tetanus 10/10 vaccine  Colonoscopy 11/05 with 10 year recall  Breast cancer screening 2/14 nl  Self exam -no breast lumps  Advance directive- she does not have a living will - will start working on that  Cognitive function addressed- see scanned forms- and if abnormal then additional documentation follows. -no major problem   PMH and SH reviewed  Meds, vitals, and allergies reviewed.   ROS: See HPI.  Otherwise negative.    Osteopenia  dexa 1/12 slt improved- she is ready to schedule her next one  D level is 41 No fractures   Hyperglycemia  Stable  Lab Results  Component Value Date   HGBA1C 5.9 07/11/2013   last wt here is inaccurate - she is stable with her wt  She is exercising  Wt is up 14 lb with bmi of 35  Hyperlipidemia zocor and diet  Lab Results  Component Value Date   CHOL 187 07/11/2013   CHOL 190 12/26/2012   CHOL 191 06/16/2012   Lab Results  Component Value Date   HDL  40.20 07/11/2013   HDL 36.70* 12/26/2012   HDL 40.98 06/16/2012   Lab Results  Component Value Date   LDLCALC 107* 07/11/2013   LDLCALC 112* 06/16/2012   LDLCALC 109* 05/12/2009   Lab Results  Component Value Date   TRIG 197.0* 07/11/2013   TRIG 288.0* 12/26/2012   TRIG 200.0* 06/16/2012   Lab Results  Component Value Date   CHOLHDL 5 07/11/2013   CHOLHDL 5 12/26/2012   CHOLHDL 5 06/16/2012   Lab Results  Component Value Date   LDLDIRECT 105.9 12/26/2012   LDLDIRECT 98.5 09/15/2011   LDLDIRECT 141.3 06/11/2011     Overall fairly stable with increased HDL    Chemistry      Component Value Date/Time   NA 142 07/11/2013 0843   K 4.9 07/11/2013 0843   CL 109 07/11/2013 0843   CO2 29 07/11/2013 0843   BUN 17 07/11/2013 0843   CREATININE 0.8 07/11/2013 0843      Component Value Date/Time   CALCIUM 8.7 07/11/2013 0843   ALKPHOS 70 07/11/2013 0843   AST 18 07/11/2013 0843   ALT 15 07/11/2013 0843   BILITOT 0.5 07/11/2013 0843      Lab Results  Component Value Date   WBC 5.5 07/11/2013   HGB 13.9 07/11/2013   HCT 41.4 07/11/2013   MCV 93.0 07/11/2013   PLT 225.0 07/11/2013  Lab Results  Component Value Date   TSH 2.45 07/11/2013      Some issues with asthma  Her insurance will not cover singulair generic  accolate- does not work as well  She has a problem with inhalers   Some knee issues - meniscal   Patient Active Problem List   Diagnosis Date Noted  . Encounter for Medicare annual wellness exam 07/16/2013  . Other screening mammogram 06/22/2011  . Intertrigo 06/22/2011  . KNEE PAIN, RIGHT 06/03/2010  . HYPERGLYCEMIA, FASTING 05/14/2009  . RHEUMATOID ARTHRITIS 05/13/2008  . ELEVATED BP W/O HYPERTENSION 05/13/2008  . PAIN IN JOINT, HAND 06/13/2007  . OSTEOPENIA 03/07/2007  . HYPERLIPIDEMIA 01/03/2007  . OBESITY 01/03/2007  . ALLERGIC RHINITIS, SEASONAL 01/03/2007  . ASTHMA 01/03/2007  . OVERACTIVE BLADDER 01/03/2007   Past Medical History    Diagnosis Date  . HPV in female 1996    HPV with colposcopy (all neg paps since)  . Hyperlipidemia   . Asthma   . Osteopenia   . Obesity   . Arthritis     RA hands(seronegative)  . Back pain   . Shoulder pain    Past Surgical History  Procedure Laterality Date  . Tonsillectomy     History  Substance Use Topics  . Smoking status: Never Smoker   . Smokeless tobacco: Not on file  . Alcohol Use: No   Family History  Problem Relation Age of Onset  . Diabetes Mother   . Hyperlipidemia Mother   . Heart disease Mother   . Hypertension Mother   . Obesity Mother   . Kyphosis Mother   . Heart disease Father   . Hypertension Father   . Cancer Father     lung Cancer smoker  . Kyphosis Sister   . Diabetes Brother    Allergies  Allergen Reactions  . Ezetimibe-Simvastatin     REACTION: leg pain  . Tolterodine Tartrate     REACTION: side effects  . Vesicare [Solifenacin Succinate]     Eye problems    Current Outpatient Prescriptions on File Prior to Visit  Medication Sig Dispense Refill  . albuterol (PROAIR HFA) 108 (90 BASE) MCG/ACT inhaler Inhale 2 puffs into the lungs every 4 (four) hours as needed for wheezing or shortness of breath.  1 Inhaler  11  . Omega-3 Fatty Acids (FISH OIL PO) Take 2 capsules by mouth daily.        Marland Kitchen oxybutynin (OXYTROL) 3.9 MG/24HR Place 1 patch onto the skin 2 (two) times a week.  8 patch  12  . simvastatin (ZOCOR) 40 MG tablet Take 1 tablet (40 mg total) by mouth at bedtime.  30 tablet  11  . zafirlukast (ACCOLATE) 10 MG tablet Take 1 tablet (10 mg total) by mouth 2 (two) times daily.  60 tablet  11   No current facility-administered medications on file prior to visit.    Review of Systems Review of Systems  Constitutional: Negative for fever, appetite change, fatigue and unexpected weight change.  Eyes: Negative for pain and visual disturbance.  Respiratory: Negative for cough and shortness of breath.   Cardiovascular: Negative for cp or  palpitations    Gastrointestinal: Negative for nausea, diarrhea and constipation.  Genitourinary: Negative for urgency and frequency.  Skin: Negative for pallor or rash   Neurological: Negative for weakness, light-headedness, numbness and headaches.  Hematological: Negative for adenopathy. Does not bruise/bleed easily.  Psychiatric/Behavioral: Negative for dysphoric mood. The patient is not nervous/anxious.  Objective:   Physical Exam  Nursing note and vitals reviewed. Constitutional: She appears well-developed and well-nourished. No distress.  obese and well appearing   HENT:  Head: Normocephalic and atraumatic.  Right Ear: External ear normal.  Left Ear: External ear normal.  Mouth/Throat: Oropharynx is clear and moist.  Eyes: Conjunctivae and EOM are normal. Pupils are equal, round, and reactive to light. No scleral icterus.  Neck: Normal range of motion. Neck supple. No JVD present. Carotid bruit is not present. No thyromegaly present.  Cardiovascular: Normal rate, regular rhythm, normal heart sounds and intact distal pulses.  Exam reveals no gallop.   Pulmonary/Chest: Effort normal and breath sounds normal. No respiratory distress. She has no wheezes. She exhibits no tenderness.  Abdominal: Soft. Bowel sounds are normal. She exhibits no distension, no abdominal bruit and no mass. There is no tenderness.  Genitourinary: No breast swelling, tenderness, discharge or bleeding.  Breast exam: No mass, nodules, thickening, tenderness, bulging, retraction, inflamation, nipple discharge or skin changes noted.  No axillary or clavicular LA.   A few superficial skin cysts in axillae  Musculoskeletal: Normal range of motion. She exhibits no edema and no tenderness.  Lymphadenopathy:    She has no cervical adenopathy.  Neurological: She is alert. She has normal reflexes. No cranial nerve deficit. She exhibits normal muscle tone. Coordination normal.  Skin: Skin is warm and dry. No rash  noted. No erythema. No pallor.  Psychiatric: She has a normal mood and affect.          Assessment & Plan:

## 2013-07-16 NOTE — Assessment & Plan Note (Signed)
Overall stable with A1C less than 6  Stressed imp of low glycemic diet and wt control along with exercise

## 2013-07-16 NOTE — Assessment & Plan Note (Signed)
Disc goals for lipids and reasons to control them Rev labs with pt Rev low sat fat diet in detail  zocor and diet  

## 2013-07-25 ENCOUNTER — Telehealth: Payer: Self-pay

## 2013-07-25 NOTE — Telephone Encounter (Signed)
Pt left v/m; was seen 07/16/13 pt checked with [employer] ins co. And request prescription for montelukast 10 mg. Pt request cb.

## 2013-07-26 HISTORY — PX: VAGINAL HYSTERECTOMY: SUR661

## 2013-07-26 HISTORY — PX: OTHER SURGICAL HISTORY: SHX169

## 2013-07-27 MED ORDER — MONTELUKAST SODIUM 10 MG PO TABS: 10.0000 mg | ORAL_TABLET | Freq: Every day | ORAL | Status: DC

## 2013-07-27 NOTE — Telephone Encounter (Signed)
Rx sent electronically to pharmacy of choice and pt notified

## 2013-07-27 NOTE — Telephone Encounter (Signed)
Printed - I do not know where she needs it sent

## 2013-09-24 ENCOUNTER — Ambulatory Visit: Payer: Self-pay | Admitting: Family Medicine

## 2013-09-25 ENCOUNTER — Encounter: Payer: Self-pay | Admitting: Family Medicine

## 2013-09-26 ENCOUNTER — Encounter: Payer: Self-pay | Admitting: *Deleted

## 2014-01-21 ENCOUNTER — Other Ambulatory Visit: Payer: Self-pay | Admitting: Family Medicine

## 2014-02-25 ENCOUNTER — Encounter: Payer: Self-pay | Admitting: Family Medicine

## 2014-02-25 ENCOUNTER — Ambulatory Visit (INDEPENDENT_AMBULATORY_CARE_PROVIDER_SITE_OTHER): Payer: Commercial Managed Care - HMO | Admitting: Family Medicine

## 2014-02-25 VITALS — BP 128/82 | HR 59 | Temp 98.5°F | Ht 63.0 in | Wt 198.0 lb

## 2014-02-25 DIAGNOSIS — R7309 Other abnormal glucose: Secondary | ICD-10-CM

## 2014-02-25 DIAGNOSIS — I10 Essential (primary) hypertension: Secondary | ICD-10-CM | POA: Insufficient documentation

## 2014-02-25 DIAGNOSIS — R030 Elevated blood-pressure reading, without diagnosis of hypertension: Secondary | ICD-10-CM

## 2014-02-25 DIAGNOSIS — R5381 Other malaise: Secondary | ICD-10-CM | POA: Insufficient documentation

## 2014-02-25 DIAGNOSIS — R0989 Other specified symptoms and signs involving the circulatory and respiratory systems: Secondary | ICD-10-CM

## 2014-02-25 DIAGNOSIS — R5383 Other fatigue: Principal | ICD-10-CM

## 2014-02-25 LAB — COMPREHENSIVE METABOLIC PANEL
ALBUMIN: 3.8 g/dL (ref 3.5–5.2)
ALK PHOS: 92 U/L (ref 39–117)
ALT: 19 U/L (ref 0–35)
AST: 26 U/L (ref 0–37)
BUN: 16 mg/dL (ref 6–23)
CO2: 28 mEq/L (ref 19–32)
Calcium: 9 mg/dL (ref 8.4–10.5)
Chloride: 104 mEq/L (ref 96–112)
Creatinine, Ser: 0.9 mg/dL (ref 0.4–1.2)
GFR: 67.63 mL/min (ref >60.00)
Glucose, Bld: 99 mg/dL (ref 70–99)
POTASSIUM: 4.4 meq/L (ref 3.5–5.1)
SODIUM: 137 meq/L (ref 135–145)
TOTAL PROTEIN: 7 g/dL (ref 6.0–8.3)
Total Bilirubin: 0.6 mg/dL (ref 0.2–1.2)

## 2014-02-25 LAB — CBC WITH DIFFERENTIAL/PLATELET
BASOS ABS: 0 10*3/uL (ref 0.0–0.1)
Basophils Relative: 0.6 % (ref 0.0–3.0)
EOS ABS: 0.1 10*3/uL (ref 0.0–0.7)
Eosinophils Relative: 1.7 % (ref 0.0–5.0)
HCT: 46 % (ref 36.0–46.0)
Hemoglobin: 15.1 g/dL — ABNORMAL HIGH (ref 12.0–15.0)
Lymphocytes Relative: 35.3 % (ref 12.0–46.0)
Lymphs Abs: 2.4 10*3/uL (ref 0.7–4.0)
MCHC: 32.8 g/dL (ref 30.0–36.0)
MCV: 94.7 fl (ref 78.0–100.0)
MONO ABS: 0.6 10*3/uL (ref 0.1–1.0)
Monocytes Relative: 9.4 % (ref 3.0–12.0)
NEUTROS PCT: 53 % (ref 43.0–77.0)
Neutro Abs: 3.6 10*3/uL (ref 1.4–7.7)
Platelets: 233 10*3/uL (ref 150.0–400.0)
RBC: 4.86 Mil/uL (ref 3.87–5.11)
RDW: 13.9 % (ref 11.5–15.5)
WBC: 6.7 10*3/uL (ref 4.0–10.5)

## 2014-02-25 LAB — HEMOGLOBIN A1C: Hgb A1c MFr Bld: 5.9 % (ref 4.6–6.5)

## 2014-02-25 LAB — VITAMIN B12: Vitamin B-12: 270 pg/mL (ref 211–911)

## 2014-02-25 LAB — TSH: TSH: 1.57 u[IU]/mL (ref 0.35–4.50)

## 2014-02-25 NOTE — Progress Notes (Signed)
Subjective:    Patient ID: Cheryl Joseph, female    DOB: 1941/01/26, 73 y.o.   MRN: [id number]  HPI Here for elevated bp and general malaise  2-3 weeks ago - she began to feel tired/ sleepy and sluggish and her ankles swelled a little Briefly she thought she had a cold   Saturday she slept on and off all day  bp was 150/90 Same on Sunday She has been very slightly dizzy (has had a few brief episodes of sudden dizziness that are fleeting)  Asthma is fairly stable (worse in the heat at times)  Had a hysterectomy at [facility] in April- was unable to exercise for a while  She thinks she did very well  Now for exercise-she rides an exercise 30 min per day/ does videos and goes to the mall and walks    Got better for a day  Then this am her bp was 967 systolic  A mild headache    BP Readings from Last 3 Encounters:  02/25/14 128/82  07/16/13 106/66  02/21/13 124/76   she wonders if her cuff is accurate   Has hx of hyperglycemia Lab Results  Component Value Date   HGBA1C 5.9 07/11/2013    A lot of loss lately Lost 4 friends in July New cancer and one friend in a coma Spends a lot of time listening to spouses She gets down at times - can be tearful when she wants to   She has been married for a year - getting used to those changes -but that is going great   Some runny nose   Feels overall a lot better today   Patient Active Problem List   Diagnosis Date Noted  . Encounter for Medicare annual wellness exam 07/16/2013  . Other screening mammogram 06/22/2011  . Intertrigo 06/22/2011  . KNEE PAIN, RIGHT 06/03/2010  . HYPERGLYCEMIA, FASTING 05/14/2009  . RHEUMATOID ARTHRITIS 05/13/2008  . ELEVATED BP W/O HYPERTENSION 05/13/2008  . PAIN IN JOINT, HAND 06/13/2007  . OSTEOPENIA 03/07/2007  . HYPERLIPIDEMIA 01/03/2007  . OBESITY 01/03/2007  . ALLERGIC RHINITIS, SEASONAL 01/03/2007  . ASTHMA 01/03/2007  . OVERACTIVE BLADDER 01/03/2007   Past Medical History    Diagnosis Date  . HPV in female 1996    HPV with colposcopy (all neg paps since)  . Hyperlipidemia   . Asthma   . Osteopenia   . Obesity   . Arthritis     RA hands(seronegative)  . Back pain   . Shoulder pain    Past Surgical History  Procedure Laterality Date  . Tonsillectomy     History  Substance Use Topics  . Smoking status: Never Smoker   . Smokeless tobacco: Not on file  . Alcohol Use: No   Family History  Problem Relation Age of Onset  . Diabetes Mother   . Hyperlipidemia Mother   . Heart disease Mother   . Hypertension Mother   . Obesity Mother   . Kyphosis Mother   . Heart disease Father   . Hypertension Father   . Cancer Father     lung Cancer smoker  . Kyphosis Sister   . Diabetes Brother    Allergies  Allergen Reactions  . Ezetimibe-Simvastatin     REACTION: leg pain  . Tolterodine Tartrate     REACTION: side effects  . Vesicare [Solifenacin Succinate]     Eye problems    Current Outpatient Prescriptions on File Prior to Visit  Medication Sig Dispense Refill  .  Calcium Carbonate-Vitamin D (CALCIUM-VITAMIN D3 PO) Take 1 capsule by mouth daily.      Marland Kitchen glucosamine-chondroitin 500-400 MG tablet Take 1 tablet by mouth daily.      Marland Kitchen Hyaluronic Acid-Vitamin C (HYALURONIC ACID PO) Take 1 tablet by mouth daily.      . Methylsulfonylmethane (MSM PO) Take 1 tablet by mouth daily.      . montelukast (SINGULAIR) 10 MG tablet Take 1 tablet (10 mg total) by mouth at bedtime.  90 tablet  3  . Omega-3 Fatty Acids (FISH OIL PO) Take 2 capsules by mouth daily.        Marland Kitchen oxybutynin (OXYTROL) 3.9 MG/24HR Place 1 patch onto the skin 2 (two) times a week.  8 patch  12  . PROAIR HFA 108 (90 BASE) MCG/ACT inhaler INHALE TWO PUFFS BY MOUTH INTO THE LUNGS EVERY 4 HOURS AS NEEDED FOR WHEEZING AND SHORTNESS OF BREATH  9 each  5  . simvastatin (ZOCOR) 40 MG tablet Take 1 tablet (40 mg total) by mouth at bedtime.  30 tablet  11  . zafirlukast (ACCOLATE) 10 MG tablet Take 1  tablet (10 mg total) by mouth 2 (two) times daily.  60 tablet  11   No current facility-administered medications on file prior to visit.     Review of Systems    Review of Systems  Constitutional: Negative for fever, appetite change,  and unexpected weight change. pos for difficulty loosing weight  Eyes: Negative for pain and visual disturbance.  Respiratory: Negative for cough and shortness of breath.   Cardiovascular: Negative for cp or palpitations    Gastrointestinal: Negative for nausea, diarrhea and constipation.  Genitourinary: Negative for urgency and frequency.  Skin: Negative for pallor or rash   Neurological: Negative for weakness, , numbness and pos for occ dull  headaches.  Hematological: Negative for adenopathy. Does not bruise/bleed easily.  Psychiatric/Behavioral: Negative for dysphoric mood. The patient is not nervous/anxious.  pos for stressors and loss     Objective:   Physical Exam  Constitutional: She appears well-developed and well-nourished. No distress.  obese and well appearing   HENT:  Head: Normocephalic and atraumatic.  Right Ear: External ear normal.  Left Ear: External ear normal.  Nose: Nose normal.  Mouth/Throat: Oropharynx is clear and moist.  Nares are boggy  Eyes: Conjunctivae and EOM are normal. Pupils are equal, round, and reactive to light. Right eye exhibits no discharge. Left eye exhibits no discharge. No scleral icterus.  Neck: Normal range of motion. Neck supple. No JVD present. Carotid bruit is not present. No thyromegaly present.  Cardiovascular: Normal rate, regular rhythm, normal heart sounds and intact distal pulses.  Exam reveals no gallop.   Pulmonary/Chest: Effort normal and breath sounds normal. No respiratory distress. She has no wheezes. She has no rales.  Abdominal: Soft. Bowel sounds are normal. She exhibits no distension and no mass. There is no tenderness.  Musculoskeletal: She exhibits no edema and no tenderness.    Lymphadenopathy:    She has no cervical adenopathy.  Neurological: She is alert. She has normal reflexes. No cranial nerve deficit. She exhibits normal muscle tone. Coordination normal.  Skin: Skin is warm and dry. No rash noted. No erythema. No pallor.  Psychiatric: She has a normal mood and affect. Her speech is normal and behavior is normal. Thought content normal. Her mood appears not anxious. Her affect is not blunt and not labile. Cognition and memory are normal. She does not exhibit a depressed  mood.  Not tearful          Assessment & Plan:   Problem List Items Addressed This Visit     Other   HYPERGLYCEMIA, FASTING     A1C today Urged to continue exercise Also stressed imp of low glycemic diet       Relevant Orders      Hemoglobin A1c   Other malaise and fatigue - Primary     Poss multifactorial with losses/stressors/ exp to the heat and poss dehydration Lab today  Reassuring exam  Disc coping techniques - and counseling  Disc hydration and safety in heat Will get and accurate bp cuff also     Relevant Orders      CBC with Differential      Comprehensive metabolic panel      TSH      Vitamin B12   Labile blood pressure      Up at home-however she is checking it with a separate cuff and stethoscope- ? Unsure if accurate Will get a new automated cuff for arm and call in readings Handout re: taking own bp BP Readings from Last 3 Encounters:  02/25/14 128/82  07/16/13 106/66  02/21/13 124/76        Relevant Orders      Comprehensive metabolic panel      TSH

## 2014-02-25 NOTE — Patient Instructions (Signed)
Get an OMRON blood pressure cuff for your arm - check blood pressure at home when you are rested and relaxed (not just after exercise or when you are feeling stressed) Drink more water Be much more careful in the heat  Talk about your problems/ stress/worries or write in a journal  Labs today  If blood pressure is still high- let us know    How to Take Your Blood Pressure HOW DO I GET A BLOOD PRESSURE MACHINE?  You can buy an electronic home blood pressure machine at your local pharmacy. Insurance will sometimes cover the cost if you have a prescription.  Ask your doctor what type of machine is best for you. There are different machines for your arm and your wrist.  If you decide to buy a machine to check your blood pressure on your arm, first check the size of your arm so you can buy the right size cuff. To check the size of your arm:   Use a measuring tape that shows both inches and centimeters.   Wrap the measuring tape around the upper-middle part of your arm. You may need someone to help you measure.   Write down your arm measurement in both inches and centimeters.   To measure your blood pressure correctly, it is important to have the right size cuff.   If your arm is up to 13 inches (up to 34 centimeters), get an adult cuff size.  If your arm is 13 to 17 inches (35 to 44 centimeters), get a large adult cuff size.    If your arm is 17 to 20 inches (45 to 52 centimeters), get an adult thigh cuff.  WHAT DO THE NUMBERS MEAN?   There are two numbers that make up your blood pressure. For example: 120/80.  The first number (120 in our example) is called the "systolic pressure." It is a measure of the pressure in your blood vessels when your heart is pumping blood.  The second number (80 in our example) is called the "diastolic pressure." It is a measure of the pressure in your blood vessels when your heart is resting between beats.  Your doctor will tell you what your  blood pressure should be. WHAT SHOULD I DO BEFORE I CHECK MY BLOOD PRESSURE?   Try to rest or relax for at least 30 minutes before you check your blood pressure.  Do not smoke.  Do not have any drinks with caffeine, such as:  Soda.  Coffee.  Tea.  Check your blood pressure in a quiet room.  Sit down and stretch out your arm on a table. Keep your arm at about the level of your heart. Let your arm relax.  Make sure that your legs are not crossed. HOW DO I CHECK MY BLOOD PRESSURE?  Follow the directions that came with your machine.  Make sure you remove any tight-fighting clothing from your arm or wrist. Wrap the cuff around your upper arm or wrist. You should be able to fit a finger between the cuff and your arm. If you cannot fit a finger between the cuff and your arm, it is too tight and should be removed and rewrapped.  Some units require you to manually pump up the arm cuff.  Automatic units inflate the cuff when you press a button.  Cuff deflation is automatic in both models.  After the cuff is inflated, the unit measures your blood pressure and pulse. The readings are shown on a monitor. Hold  still and breathe normally while the cuff is inflated.  Getting a reading takes less than a minute.  Some models store readings in a memory. Some provide a printout of readings. If your machine does not store your readings, keep a written record.  Take readings with you to your next visit with your doctor. Document Released: 06/24/2008 Document Revised: 11/26/2013 Document Reviewed: 09/06/2013 [facility] Patient Information 2015 [place], [place]. This information is not intended to replace advice given to you by your health care provider. Make sure you discuss any questions you have with your health care provider. How to Take Your Blood Pressure HOW DO I GET A BLOOD PRESSURE MACHINE?  You can buy an electronic home blood pressure machine at your local pharmacy. Insurance will  sometimes cover the cost if you have a prescription.  Ask your doctor what type of machine is best for you. There are different machines for your arm and your wrist.  If you decide to buy a machine to check your blood pressure on your arm, first check the size of your arm so you can buy the right size cuff. To check the size of your arm:   Use a measuring tape that shows both inches and centimeters.   Wrap the measuring tape around the upper-middle part of your arm. You may need someone to help you measure.   Write down your arm measurement in both inches and centimeters.   To measure your blood pressure correctly, it is important to have the right size cuff.   If your arm is up to 13 inches (up to 34 centimeters), get an adult cuff size.  If your arm is 13 to 17 inches (35 to 44 centimeters), get a large adult cuff size.    If your arm is 17 to 20 inches (45 to 52 centimeters), get an adult thigh cuff.  WHAT DO THE NUMBERS MEAN?   There are two numbers that make up your blood pressure. For example: 120/80.  The first number (120 in our example) is called the "systolic pressure." It is a measure of the pressure in your blood vessels when your heart is pumping blood.  The second number (80 in our example) is called the "diastolic pressure." It is a measure of the pressure in your blood vessels when your heart is resting between beats.  Your doctor will tell you what your blood pressure should be. WHAT SHOULD I DO BEFORE I CHECK MY BLOOD PRESSURE?   Try to rest or relax for at least 30 minutes before you check your blood pressure.  Do not smoke.  Do not have any drinks with caffeine, such as:  Soda.  Coffee.  Tea.  Check your blood pressure in a quiet room.  Sit down and stretch out your arm on a table. Keep your arm at about the level of your heart. Let your arm relax.  Make sure that your legs are not crossed. HOW DO I CHECK MY BLOOD PRESSURE?  Follow the  directions that came with your machine.  Make sure you remove any tight-fighting clothing from your arm or wrist. Wrap the cuff around your upper arm or wrist. You should be able to fit a finger between the cuff and your arm. If you cannot fit a finger between the cuff and your arm, it is too tight and should be removed and rewrapped.  Some units require you to manually pump up the arm cuff.  Automatic units inflate the cuff when you  press a button.  Cuff deflation is automatic in both models.  After the cuff is inflated, the unit measures your blood pressure and pulse. The readings are shown on a monitor. Hold still and breathe normally while the cuff is inflated.  Getting a reading takes less than a minute.  Some models store readings in a memory. Some provide a printout of readings. If your machine does not store your readings, keep a written record.  Take readings with you to your next visit with your doctor. Document Released: 06/24/2008 Document Revised: 11/26/2013 Document Reviewed: 09/06/2013 [facility] Patient Information 2015 [place], [place]. This information is not intended to replace advice given to you by your health care provider. Make sure you discuss any questions you have with your health care provider.

## 2014-02-25 NOTE — Assessment & Plan Note (Signed)
A1C today Urged to continue exercise Also stressed imp of low glycemic diet

## 2014-02-25 NOTE — Progress Notes (Signed)
Pre visit review using our clinic review tool, if applicable. No additional management support is needed unless otherwise documented below in the visit note. 

## 2014-02-25 NOTE — Assessment & Plan Note (Signed)
Up at home-however she is checking it with a separate cuff and stethoscope- ? Unsure if accurate Will get a new automated cuff for arm and call in readings Handout re: taking own bp BP Readings from Last 3 Encounters:  02/25/14 128/82  07/16/13 106/66  02/21/13 124/76

## 2014-02-25 NOTE — Assessment & Plan Note (Signed)
Poss multifactorial with losses/stressors/ exp to the heat and poss dehydration Lab today  Reassuring exam  Disc coping techniques - and counseling  Disc hydration and safety in heat Will get and accurate bp cuff also

## 2014-02-26 ENCOUNTER — Encounter: Payer: Self-pay | Admitting: *Deleted

## 2014-03-18 ENCOUNTER — Encounter: Payer: Self-pay | Admitting: Family Medicine

## 2014-03-18 ENCOUNTER — Ambulatory Visit (INDEPENDENT_AMBULATORY_CARE_PROVIDER_SITE_OTHER): Payer: Commercial Managed Care - HMO | Admitting: Family Medicine

## 2014-03-18 ENCOUNTER — Ambulatory Visit (INDEPENDENT_AMBULATORY_CARE_PROVIDER_SITE_OTHER)
Admission: RE | Admit: 2014-03-18 | Discharge: 2014-03-18 | Disposition: A | Discharge status: Home / Self Care | Payer: Commercial Managed Care - HMO | Source: Ambulatory Visit | Attending: Family Medicine | Admitting: Family Medicine

## 2014-03-18 VITALS — BP 140/82 | HR 63 | Temp 98.3°F | Ht 63.0 in | Wt 199.0 lb

## 2014-03-18 DIAGNOSIS — R1032 Left lower quadrant pain: Secondary | ICD-10-CM

## 2014-03-18 DIAGNOSIS — R109 Unspecified abdominal pain: Secondary | ICD-10-CM

## 2014-03-18 LAB — POCT URINALYSIS DIPSTICK
Bilirubin, UA: NEGATIVE
GLUCOSE UA: NEGATIVE
Ketones, UA: NEGATIVE
Leukocytes, UA: NEGATIVE
Nitrite, UA: NEGATIVE
Protein, UA: NEGATIVE
Spec Grav, UA: 1.025
UROBILINOGEN UA: 0.2
pH, UA: 5.5

## 2014-03-18 LAB — CBC WITH DIFFERENTIAL/PLATELET
BASOS PCT: 0.7 % (ref 0.0–3.0)
Basophils Absolute: 0 10*3/uL (ref 0.0–0.1)
Eosinophils Absolute: 0.1 10*3/uL (ref 0.0–0.7)
Eosinophils Relative: 2 % (ref 0.0–5.0)
HEMATOCRIT: 44.8 % (ref 36.0–46.0)
HEMOGLOBIN: 15.3 g/dL — AB (ref 12.0–15.0)
Lymphocytes Relative: 35.7 % (ref 12.0–46.0)
Lymphs Abs: 2.3 10*3/uL (ref 0.7–4.0)
MCHC: 34.2 g/dL (ref 30.0–36.0)
MCV: 92 fl (ref 78.0–100.0)
Monocytes Absolute: 0.6 10*3/uL (ref 0.1–1.0)
Monocytes Relative: 10 % (ref 3.0–12.0)
NEUTROS ABS: 3.3 10*3/uL (ref 1.4–7.7)
Neutrophils Relative %: 51.6 % (ref 43.0–77.0)
Platelets: 232 10*3/uL (ref 150.0–400.0)
RBC: 4.87 Mil/uL (ref 3.87–5.11)
RDW: 14 % (ref 11.5–15.5)
WBC: 6.4 10*3/uL (ref 4.0–10.5)

## 2014-03-18 MED ORDER — METRONIDAZOLE 500 MG PO TABS: 500.0000 mg | ORAL_TABLET | Freq: Three times a day (TID) | ORAL | Status: DC

## 2014-03-18 MED ORDER — CIPROFLOXACIN HCL 500 MG PO TABS
500.0000 mg | ORAL_TABLET | Freq: Two times a day (BID) | ORAL | Status: DC
Start: 2014-03-18 — End: 2014-03-28

## 2014-03-18 NOTE — Patient Instructions (Signed)
Hold the printed px for antibiotics for diverticulitis until we call you  Xray of abdomen today  Blood work today We will get back to you  drink fluids  In general for diverticulosis-eat lots of fiber (when you are feeling better) , and also avoid nuts/seeds

## 2014-03-18 NOTE — Progress Notes (Signed)
Pre visit review using our clinic review tool, if applicable. No additional management support is needed unless otherwise documented below in the visit note. 

## 2014-03-18 NOTE — Progress Notes (Signed)
Subjective:    Patient ID: Cheryl Joseph, female    DOB: 1941-05-07, 73 y.o.   MRN: [id number]  HPI Here for low abd pain   thusrday- started to have some low abdominal pain (predominantly L side)  achey in nature  Hurts more to move around  No n/v/d or bowel change , no blood in stool  Has hx of diverticulosis  She ate corn the week before , no nuts , some seeds from strawberries   Some pain in L low back   No rash   No urinary symptoms at all  No fever   Feels tired/ some malaise   Results for orders placed in visit on 03/18/14  POCT URINALYSIS DIPSTICK      Result Value Ref Range   Color, UA yellow     Clarity, UA hazy     Glucose, UA neg.     Bilirubin, UA neg.     Ketones, UA neg.     Spec Grav, UA 1.025     Blood, UA Trace     pH, UA 5.5     Protein, UA neg.     Urobilinogen, UA 0.2     Nitrite, UA neg.     Leukocytes, UA Negative       Patient Active Problem List   Diagnosis Date Noted  . Abdominal pain, left lower quadrant 03/18/2014  . Other malaise and fatigue 02/25/2014  . Labile blood pressure 02/25/2014  . Encounter for Medicare annual wellness exam 07/16/2013  . Other screening mammogram 06/22/2011  . Intertrigo 06/22/2011  . KNEE PAIN, RIGHT 06/03/2010  . HYPERGLYCEMIA, FASTING 05/14/2009  . RHEUMATOID ARTHRITIS 05/13/2008  . ELEVATED BP W/O HYPERTENSION 05/13/2008  . PAIN IN JOINT, HAND 06/13/2007  . OSTEOPENIA 03/07/2007  . HYPERLIPIDEMIA 01/03/2007  . OBESITY 01/03/2007  . ALLERGIC RHINITIS, SEASONAL 01/03/2007  . ASTHMA 01/03/2007  . OVERACTIVE BLADDER 01/03/2007   Past Medical History  Diagnosis Date  . HPV in female 1996    HPV with colposcopy (all neg paps since)  . Hyperlipidemia   . Asthma   . Osteopenia   . Obesity   . Arthritis     RA hands(seronegative)  . Back pain   . Shoulder pain    Past Surgical History  Procedure Laterality Date  . Tonsillectomy     History  Substance Use Topics  . Smoking status:  Never Smoker   . Smokeless tobacco: Not on file  . Alcohol Use: No   Family History  Problem Relation Age of Onset  . Diabetes Mother   . Hyperlipidemia Mother   . Heart disease Mother   . Hypertension Mother   . Obesity Mother   . Kyphosis Mother   . Heart disease Father   . Hypertension Father   . Cancer Father     lung Cancer smoker  . Kyphosis Sister   . Diabetes Brother    Allergies  Allergen Reactions  . Ezetimibe-Simvastatin     REACTION: leg pain  . Tolterodine Tartrate     REACTION: side effects  . Vesicare [Solifenacin Succinate]     Eye problems    Current Outpatient Prescriptions on File Prior to Visit  Medication Sig Dispense Refill  . b complex vitamins tablet Take 1 tablet by mouth daily.      . Calcium Carbonate-Vitamin D (CALCIUM-VITAMIN D3 PO) Take 1 capsule by mouth daily.      Marland Kitchen glucosamine-chondroitin 500-400 MG tablet Take 1 tablet by  mouth daily.      Marland Kitchen Hyaluronic Acid-Vitamin C (HYALURONIC ACID PO) Take 1 tablet by mouth daily.      . Methylsulfonylmethane (MSM PO) Take 1 tablet by mouth daily.      . montelukast (SINGULAIR) 10 MG tablet Take 1 tablet (10 mg total) by mouth at bedtime.  90 tablet  3  . Multiple Vitamin (MULTIVITAMIN) capsule Take 1 capsule by mouth daily.      . Omega-3 Fatty Acids (FISH OIL PO) Take 2 capsules by mouth daily.        Marland Kitchen oxybutynin (OXYTROL) 3.9 MG/24HR Place 1 patch onto the skin 2 (two) times a week.  8 patch  12  . PROAIR HFA 108 (90 BASE) MCG/ACT inhaler INHALE TWO PUFFS BY MOUTH INTO THE LUNGS EVERY 4 HOURS AS NEEDED FOR WHEEZING AND SHORTNESS OF BREATH  9 each  5  . simvastatin (ZOCOR) 40 MG tablet Take 1 tablet (40 mg total) by mouth at bedtime.  30 tablet  11  . Ubiquinol 100 MG CAPS Take 1 capsule by mouth 3 (three) times a week.      . zafirlukast (ACCOLATE) 10 MG tablet Take 1 tablet (10 mg total) by mouth 2 (two) times daily.  60 tablet  11   No current facility-administered medications on file prior to  visit.    Review of Systems    Review of Systems  Constitutional: Negative for fever, appetite change,  and unexpected weight change.  Eyes: Negative for pain and visual disturbance.  Respiratory: Negative for cough and shortness of breath.   Cardiovascular: Negative for cp or palpitations    Gastrointestinal: Negative for nausea, diarrhea and constipation. pos for LLQ abdominal pain and tenderness, neg for blood in stool or dark stool Genitourinary: Negative for urgency and frequency. neg for hematuria  Skin: Negative for pallor or rash   Neurological: Negative for weakness, light-headedness, numbness and headaches.  Hematological: Negative for adenopathy. Does not bruise/bleed easily.  Psychiatric/Behavioral: Negative for dysphoric mood. The patient is not nervous/anxious.      Objective:   Physical Exam  Constitutional: She appears well-developed and well-nourished. No distress.  obese and well appearing   HENT:  Head: Normocephalic and atraumatic.  Mouth/Throat: Oropharynx is clear and moist.  Eyes: Conjunctivae and EOM are normal. Pupils are equal, round, and reactive to light. No scleral icterus.  Neck: Normal range of motion. Neck supple. No JVD present.  Cardiovascular: Normal rate and regular rhythm.   No murmur heard. Pulmonary/Chest: Effort normal and breath sounds normal. No respiratory distress. She has no wheezes. She has no rales.  Abdominal: Soft. Bowel sounds are normal. She exhibits no distension and no mass. There is no hepatosplenomegaly. There is tenderness in the left lower quadrant. There is no rigidity, no rebound, no guarding and no CVA tenderness.  Musculoskeletal: She exhibits no edema.  Lymphadenopathy:    She has no cervical adenopathy.  Neurological: She is alert. She has normal reflexes.  Skin: Skin is warm and dry. No rash noted. No erythema. No pallor.  No jaundice   Psychiatric: She has a normal mood and affect.          Assessment & Plan:     Problem List Items Addressed This Visit     Other   Abdominal pain, left lower quadrant     Suspect diverticulitis Cbc with diff now abd xr  Given px for cipro and flagyl- to hold until xray result Rev ua -unremarkable inst  to call if worse/new symptoms /fever/ etc  Disc nature of diverticulitis and what to avoid in diet     Relevant Orders      DG Abd 2 Views (Completed)      CBC with Differential (Completed)    Other Visit Diagnoses   Flank pain    -  Primary    Relevant Orders       POCT urinalysis dipstick (Completed)

## 2014-03-18 NOTE — Assessment & Plan Note (Signed)
Suspect diverticulitis Cbc with diff now abd xr  Given px for cipro and flagyl- to hold until xray result Rev ua -unremarkable inst to call if worse/new symptoms /fever/ etc  Disc nature of diverticulitis and what to avoid in diet

## 2014-03-28 ENCOUNTER — Telehealth: Payer: Self-pay | Admitting: Family Medicine

## 2014-03-28 ENCOUNTER — Ambulatory Visit (INDEPENDENT_AMBULATORY_CARE_PROVIDER_SITE_OTHER): Payer: Commercial Managed Care - HMO | Admitting: Internal Medicine

## 2014-03-28 ENCOUNTER — Encounter: Payer: Self-pay | Admitting: Internal Medicine

## 2014-03-28 VITALS — BP 128/78 | HR 62 | Temp 98.2°F | Wt 199.0 lb

## 2014-03-28 DIAGNOSIS — B379 Candidiasis, unspecified: Secondary | ICD-10-CM

## 2014-03-28 DIAGNOSIS — T3695XA Adverse effect of unspecified systemic antibiotic, initial encounter: Principal | ICD-10-CM

## 2014-03-28 MED ORDER — FLUCONAZOLE 150 MG PO TABS: 150.0000 mg | ORAL_TABLET | Freq: Once | ORAL | Status: DC

## 2014-03-28 NOTE — Telephone Encounter (Signed)
Patient Information:  Caller Name: Marjan  Phone: [phone]  Patient: Monetta, Lick  Gender: Female  DOB: 1940-12-13  Age: 73 Years  PCP: Loura Pardon [facility])  Office Follow Up:  Does the office need to follow up with this patient?: No  Instructions For The Office: N/A  RN Note:  will make appt for today.  Symptoms  Reason For Call & Symptoms: States was treated with Flagyl/Cipro last week for diverticulitis. 2 days ago began having vaginal burning/itching and suspected yeast infection--began using some Monistat she had (thinks expired) and developed itching all over body last night that resolved with a cool shower. Also yesterday and today having small amount of blood on toilet paper that she believes is vaginal--first was brown than red. No blood in urine. No other vaginal discharge. Also reports generally feeling a little run down. No fever.  Reviewed Health History In EMR: Yes  Reviewed Medications In EMR: Yes  Reviewed Allergies In EMR: Yes  Reviewed Surgeries / Procedures: Yes  Date of Onset of Symptoms: 03/26/2014  Guideline(s) Used:  Vaginal Bleeding - Abnormal  Disposition Per Guideline:   See Today in Office  Reason For Disposition Reached:   Patient wants to be seen  Advice Given:  Call Back If:  Bleeding becomes worse  You become worse.  Patient Will Follow Care Advice:  YES  Appointment Scheduled:  03/28/2014 13:00:00 Appointment Scheduled Provider: Webb Silversmith [facility])

## 2014-03-28 NOTE — Patient Instructions (Addendum)

## 2014-03-28 NOTE — Progress Notes (Signed)
Pre visit review using our clinic review tool, if applicable. No additional management support is needed unless otherwise documented below in the visit note. 

## 2014-03-28 NOTE — Progress Notes (Signed)
Subjective:    Patient ID: Cheryl Joseph, female    DOB: 06/17/1941, 73 y.o.   MRN: [id number]  HPI  Pt presents to the clinic today with c/o vaginal itching, burning and bleeding. She reports this started 2 days ago after taking Monistat OTC for a possible yeast infection. She had been on Cipro/Flagyl 1 week prior to that for diverticulitis flare. She also c/o itching all over her body. She denies urinary symptoms or vaginal discharge/odor.  Review of Systems   Past Medical History  Diagnosis Date  . HPV in female 1996    HPV with colposcopy (all neg paps since)  . Hyperlipidemia   . Asthma   . Osteopenia   . Obesity   . Arthritis     RA hands(seronegative)  . Back pain   . Shoulder pain     Current Outpatient Prescriptions  Medication Sig Dispense Refill  . b complex vitamins tablet Take 1 tablet by mouth daily.      . Calcium Carbonate-Vitamin D (CALCIUM-VITAMIN D3 PO) Take 1 capsule by mouth daily.      . ciprofloxacin (CIPRO) 500 MG tablet Take 1 tablet (500 mg total) by mouth 2 (two) times daily.  14 tablet  0  . glucosamine-chondroitin 500-400 MG tablet Take 1 tablet by mouth daily.      Marland Kitchen Hyaluronic Acid-Vitamin C (HYALURONIC ACID PO) Take 1 tablet by mouth daily.      . Methylsulfonylmethane (MSM PO) Take 1 tablet by mouth daily.      . metroNIDAZOLE (FLAGYL) 500 MG tablet Take 1 tablet (500 mg total) by mouth 3 (three) times daily.  21 tablet  0  . montelukast (SINGULAIR) 10 MG tablet Take 1 tablet (10 mg total) by mouth at bedtime.  90 tablet  3  . Multiple Vitamin (MULTIVITAMIN) capsule Take 1 capsule by mouth daily.      . Omega-3 Fatty Acids (FISH OIL PO) Take 2 capsules by mouth daily.        Marland Kitchen oxybutynin (OXYTROL) 3.9 MG/24HR Place 1 patch onto the skin 2 (two) times a week.  8 patch  12  . PROAIR HFA 108 (90 BASE) MCG/ACT inhaler INHALE TWO PUFFS BY MOUTH INTO THE LUNGS EVERY 4 HOURS AS NEEDED FOR WHEEZING AND SHORTNESS OF BREATH  9 each  5  . simvastatin  (ZOCOR) 40 MG tablet Take 1 tablet (40 mg total) by mouth at bedtime.  30 tablet  11  . Ubiquinol 100 MG CAPS Take 1 capsule by mouth 3 (three) times a week.      . zafirlukast (ACCOLATE) 10 MG tablet Take 1 tablet (10 mg total) by mouth 2 (two) times daily.  60 tablet  11   No current facility-administered medications for this visit.    Allergies  Allergen Reactions  . Ezetimibe-Simvastatin     REACTION: leg pain  . Tolterodine Tartrate     REACTION: side effects  . Vesicare [Solifenacin Succinate]     Eye problems     Family History  Problem Relation Age of Onset  . Diabetes Mother   . Hyperlipidemia Mother   . Heart disease Mother   . Hypertension Mother   . Obesity Mother   . Kyphosis Mother   . Heart disease Father   . Hypertension Father   . Cancer Father     lung Cancer smoker  . Kyphosis Sister   . Diabetes Brother     History   Social History  .  Marital Status: Widowed    Spouse Name: N/A    Number of Children: N/A  . Years of Education: N/A   Occupational History  . Not on file.   Social History Main Topics  . Smoking status: Never Smoker   . Smokeless tobacco: Not on file  . Alcohol Use: No  . Drug Use: No  . Sexual Activity: Not on file   Other Topics Concern  . Not on file   Social History Narrative  . No narrative on file     Constitutional: Denies fever, malaise, fatigue, headache or abrupt weight changes. Marland Kitchen Respiratory: Denies difficulty breathing, shortness of breath, cough or sputum production.   Gastrointestinal: Denies abdominal pain, bloating, constipation, diarrhea or blood in the stool.  GU: Pt reports vaginal burning and bleeding. Denies urgency, frequency, pain with urination, burning sensation, blood in urine, odor or discharge. Skin: Pt reports itching. Denies redness, rashes, lesions or ulcercations.    No other specific complaints in a complete review of systems (except as listed in HPI above).   Objective:   Physical  Exam   BP 128/78  Pulse 62  Temp(Src) 98.2 F (36.8 C) (Oral)  Wt 199 lb (90.266 kg)  SpO2 97% Wt Readings from Last 3 Encounters:  03/28/14 199 lb (90.266 kg)  03/18/14 199 lb (90.266 kg)  02/25/14 198 lb (89.812 kg)    General: Appears her stated age, well developed, well nourished in NAD. Skin: Warm, dry and intact. No rashes, lesions or ulcerations noted. Ho hives noted. Abdomen: Soft and nontender. Normal bowel sounds, no bruits noted. No distention or masses noted. Liver, spleen and kidneys non palpable. Pelvic: Normal female anatomy. Large Rectocele noted. External labia irritated with white coating noted.  BMET    Component Value Date/Time   NA 137 02/25/2014 1325   K 4.4 02/25/2014 1325   CL 104 02/25/2014 1325   CO2 28 02/25/2014 1325   GLUCOSE 99 02/25/2014 1325   BUN 16 02/25/2014 1325   CREATININE 0.9 02/25/2014 1325   CALCIUM 9.0 02/25/2014 1325   GFRNONAA 69.26 06/01/2010 0848   GFRAA 92 05/06/2008 1006    Lipid Panel     Component Value Date/Time   CHOL 187 07/11/2013 0843   TRIG 197.0* 07/11/2013 0843   HDL 40.20 07/11/2013 0843   CHOLHDL 5 07/11/2013 0843   VLDL 39.4 07/11/2013 0843   LDLCALC 107* 07/11/2013 0843    CBC    Component Value Date/Time   WBC 6.4 03/18/2014 0922   RBC 4.87 03/18/2014 0922   HGB 15.3* 03/18/2014 0922   HCT 44.8 03/18/2014 0922   PLT 232.0 03/18/2014 0922   MCV 92.0 03/18/2014 0922   MCHC 34.2 03/18/2014 0922   RDW 14.0 03/18/2014 0922   LYMPHSABS 2.3 03/18/2014 0922   MONOABS 0.6 03/18/2014 0922   EOSABS 0.1 03/18/2014 0922   BASOSABS 0.0 03/18/2014 0922    Hgb A1C Lab Results  Component Value Date   HGBA1C 5.9 02/25/2014        Assessment & Plan:   Antibiotic induced yeast infection:  erx for diflucan 150 mg PO x 1 May want to discuss the rectocele with your surgeon  RTC as needed or if symptoms persist or worsen

## 2014-04-26 ENCOUNTER — Ambulatory Visit (INDEPENDENT_AMBULATORY_CARE_PROVIDER_SITE_OTHER): Payer: Commercial Managed Care - HMO

## 2014-04-26 DIAGNOSIS — Z23 Encounter for immunization: Secondary | ICD-10-CM

## 2014-05-25 ENCOUNTER — Ambulatory Visit (INDEPENDENT_AMBULATORY_CARE_PROVIDER_SITE_OTHER): Payer: Commercial Managed Care - HMO | Admitting: Family Medicine

## 2014-05-25 ENCOUNTER — Encounter: Payer: Self-pay | Admitting: Family Medicine

## 2014-05-25 VITALS — BP 122/80 | HR 69 | Temp 97.7°F | Ht 63.0 in | Wt 199.0 lb

## 2014-05-25 DIAGNOSIS — K5732 Diverticulitis of large intestine without perforation or abscess without bleeding: Secondary | ICD-10-CM | POA: Insufficient documentation

## 2014-05-25 DIAGNOSIS — R030 Elevated blood-pressure reading, without diagnosis of hypertension: Secondary | ICD-10-CM

## 2014-05-25 MED ORDER — METRONIDAZOLE 500 MG PO TABS: 500.0000 mg | ORAL_TABLET | Freq: Three times a day (TID) | ORAL | Status: DC

## 2014-05-25 MED ORDER — CIPROFLOXACIN HCL 500 MG PO TABS: 500.0000 mg | ORAL_TABLET | Freq: Two times a day (BID) | ORAL | Status: DC

## 2014-05-25 NOTE — Patient Instructions (Signed)

## 2014-05-25 NOTE — Progress Notes (Signed)
Patient ID: MY RINKE, female   DOB: [birth year], 73 y.o.   MRN: [id number] Cheryl Joseph [id number] 12/03/1940 05/25/2014      Progress Note-Follow Up  Subjective  Chief Complaint  Chief Complaint  Patient presents with  . Diverticulitis    woke up this morning and feels better- no pain in left side    HPI  Patient is a 73 year old female in today for routine medical care. Had 2 days of abdominal pain in LLQ and nausea. No fevers, chills, myalgias, malaise, this morning feels better, no LLQ pain, just a dull ache in lower abdomen. No N or diarrhea. Denies CP/palp/SOB/HA/congestion/fevers or GU c/o. Taking meds as prescribed  Past Medical History  Diagnosis Date  . HPV in female 1996    HPV with colposcopy (all neg paps since)  . Hyperlipidemia   . Asthma   . Osteopenia   . Obesity   . Arthritis     RA hands(seronegative)  . Back pain   . Shoulder pain     Past Surgical History  Procedure Laterality Date  . Tonsillectomy      Family History  Problem Relation Age of Onset  . Diabetes Mother   . Hyperlipidemia Mother   . Heart disease Mother   . Hypertension Mother   . Obesity Mother   . Kyphosis Mother   . Heart disease Father   . Hypertension Father   . Cancer Father     lung Cancer smoker  . Kyphosis Sister   . Diabetes Brother     History   Social History  . Marital Status: Widowed    Spouse Name: N/A    Number of Children: N/A  . Years of Education: N/A   Occupational History  . Not on file.   Social History Main Topics  . Smoking status: Never Smoker   . Smokeless tobacco: Not on file  . Alcohol Use: No  . Drug Use: No  . Sexual Activity: Not on file   Other Topics Concern  . Not on file   Social History Narrative  . No narrative on file    Current Outpatient Prescriptions on File Prior to Visit  Medication Sig Dispense Refill  . b complex vitamins tablet Take 1 tablet by mouth daily.      . Calcium Carbonate-Vitamin D  (CALCIUM-VITAMIN D3 PO) Take 1 capsule by mouth daily.      Marland Kitchen glucosamine-chondroitin 500-400 MG tablet Take 1 tablet by mouth daily.      Marland Kitchen Hyaluronic Acid-Vitamin C (HYALURONIC ACID PO) Take 1 tablet by mouth daily.      . Methylsulfonylmethane (MSM PO) Take 1 tablet by mouth daily.      . montelukast (SINGULAIR) 10 MG tablet Take 1 tablet (10 mg total) by mouth at bedtime.  90 tablet  3  . Multiple Vitamin (MULTIVITAMIN) capsule Take 1 capsule by mouth daily.      . Omega-3 Fatty Acids (FISH OIL PO) Take 2 capsules by mouth daily.        Marland Kitchen PROAIR HFA 108 (90 BASE) MCG/ACT inhaler INHALE TWO PUFFS BY MOUTH INTO THE LUNGS EVERY 4 HOURS AS NEEDED FOR WHEEZING AND SHORTNESS OF BREATH  9 each  5  . simvastatin (ZOCOR) 40 MG tablet Take 1 tablet (40 mg total) by mouth at bedtime.  30 tablet  11  . Ubiquinol 100 MG CAPS Take 1 capsule by mouth 3 (three) times a week.      Marland Kitchen  zafirlukast (ACCOLATE) 10 MG tablet Take 1 tablet (10 mg total) by mouth 2 (two) times daily.  60 tablet  11   No current facility-administered medications on file prior to visit.    Allergies  Allergen Reactions  . Ezetimibe-Simvastatin     REACTION: leg pain  . Tolterodine Tartrate     REACTION: side effects  . Vesicare [Solifenacin Succinate]     Eye problems     Review of Systems  Review of Systems  Constitutional: Negative for fever and malaise/fatigue.  HENT: Negative for congestion.   Eyes: Negative for discharge.  Respiratory: Negative for shortness of breath.   Cardiovascular: Negative for chest pain, palpitations and leg swelling.  Gastrointestinal: Positive for nausea and abdominal pain. Negative for diarrhea, constipation, blood in stool and melena.  Genitourinary: Negative for dysuria.  Musculoskeletal: Negative for falls.  Skin: Negative for rash.  Neurological: Negative for loss of consciousness and headaches.  Endo/Heme/Allergies: Negative for polydipsia.  Psychiatric/Behavioral: Negative for  depression and suicidal ideas. The patient is not nervous/anxious and does not have insomnia.     Objective  BP 122/80  Pulse 69  Temp(Src) 97.7 F (36.5 C) (Oral)  Ht 5\' 3"  (1.6 m)  Wt 199 lb (90.266 kg)  BMI 35.26 kg/m2  SpO2 99%  Physical Exam  Physical Exam  Constitutional: She is oriented to person, place, and time and well-developed, well-nourished, and in no distress. No distress.  HENT:  Head: Normocephalic and atraumatic.  Eyes: Conjunctivae are normal.  Neck: Neck supple. No thyromegaly present.  Cardiovascular: Normal rate, regular rhythm and normal heart sounds.   No murmur heard. Pulmonary/Chest: Effort normal and breath sounds normal. She has no wheezes.  Abdominal: She exhibits no distension and no mass.  Musculoskeletal: She exhibits no edema.  Lymphadenopathy:    She has no cervical adenopathy.  Neurological: She is alert and oriented to person, place, and time.  Skin: Skin is warm and dry. No rash noted. She is not diaphoretic.  Psychiatric: Memory, affect and judgment normal.    Lab Results  Component Value Date   TSH 1.57 02/25/2014   Lab Results  Component Value Date   WBC 6.4 03/18/2014   HGB 15.3* 03/18/2014   HCT 44.8 03/18/2014   MCV 92.0 03/18/2014   PLT 232.0 03/18/2014   Lab Results  Component Value Date   CREATININE 0.9 02/25/2014   BUN 16 02/25/2014   NA 137 02/25/2014   K 4.4 02/25/2014   CL 104 02/25/2014   CO2 28 02/25/2014   Lab Results  Component Value Date   ALT 19 02/25/2014   AST 26 02/25/2014   ALKPHOS 92 02/25/2014   BILITOT 0.6 02/25/2014   Lab Results  Component Value Date   CHOL 187 07/11/2013   Lab Results  Component Value Date   HDL 40.20 07/11/2013   Lab Results  Component Value Date   LDLCALC 107* 07/11/2013   Lab Results  Component Value Date   TRIG 197.0* 07/11/2013   Lab Results  Component Value Date   CHOLHDL 5 07/11/2013    Assessment & Plan  ELEVATED BP W/O HYPERTENSION Well controlled,  Encouraged heart  healthy diet such as the DASH diet and exercise as tolerated.   Diverticulitis of colon without hemorrhage Actually much better today. Is given paper copies of cipro and flagyl to take if she worsens, encouraged to increase hydration and eat a bland diet for a few day.s start a probiotic and take meds only if  worsens again

## 2014-05-25 NOTE — Progress Notes (Signed)
Pre visit review using our clinic review tool, if applicable. No additional management support is needed unless otherwise documented below in the visit note. 

## 2014-05-25 NOTE — Assessment & Plan Note (Signed)
Actually much better today. Is given paper copies of cipro and flagyl to take if she worsens, encouraged to increase hydration and eat a bland diet for a few day.s start a probiotic and take meds only if worsens again

## 2014-05-25 NOTE — Assessment & Plan Note (Signed)
Well controlled, Encouraged heart healthy diet such as the DASH diet and exercise as tolerated.  

## 2014-06-12 ENCOUNTER — Ambulatory Visit (INDEPENDENT_AMBULATORY_CARE_PROVIDER_SITE_OTHER): Payer: Commercial Managed Care - HMO | Admitting: Family Medicine

## 2014-06-12 ENCOUNTER — Encounter: Payer: Self-pay | Admitting: Family Medicine

## 2014-06-12 VITALS — BP 132/74 | HR 58 | Temp 98.1°F | Ht 63.0 in | Wt 200.2 lb

## 2014-06-12 DIAGNOSIS — L304 Erythema intertrigo: Secondary | ICD-10-CM

## 2014-06-12 DIAGNOSIS — N318 Other neuromuscular dysfunction of bladder: Secondary | ICD-10-CM

## 2014-06-12 MED ORDER — OXYBUTYNIN 3.9 MG/24HR TD PTTW: 1.0000 | MEDICATED_PATCH | TRANSDERMAL | Status: DC

## 2014-06-12 MED ORDER — KETOCONAZOLE 2 % EX CREA: 1.0000 "application " | TOPICAL_CREAM | Freq: Every day | CUTANEOUS | Status: DC

## 2014-06-12 NOTE — Assessment & Plan Note (Signed)
With some urge incontinence oxytrol no longer otc  Will px it and see if affordable  If not-trial of oxybutinin oral would be reasonable  Then urol ref if not imp

## 2014-06-12 NOTE — Assessment & Plan Note (Signed)
Px ketoconazole cream that worked before  In creases of thighs/occ under breasts  inst to keep it very dry otherwise Update if not starting to improve in a week or if worsening

## 2014-06-12 NOTE — Progress Notes (Signed)
Subjective:    Patient ID: Cheryl Joseph, female    DOB: Jun 30, 1941, 73 y.o.   MRN: [id number]  HPI Here for skin problem and medication management   She has a "yeast infection" on - both legs/inner thighs  Used a cream in the past that helped - ? What it was  Now keeping clean / dry and using a medicated powder over the counter Very irritated   Using oxytrol - and they are taking it off the market for otc use  Had reaction to vesicare (eye issues)- not glaucoma  Uses AZO-helps some  Without anything -has frequent urination  No burning or blood in urine  Does leak some w/o patch  Has a little stress incontinence (not bad)  Usually nocturia 1-2 times per night with patch/ without it all night long    Had hyst in April   Is careful to avoid caffeine and soft drinks   Patient Active Problem List   Diagnosis Date Noted  . Diverticulitis of colon without hemorrhage 05/25/2014  . Abdominal pain, left lower quadrant 03/18/2014  . Other malaise and fatigue 02/25/2014  . Labile blood pressure 02/25/2014  . Encounter for Medicare annual wellness exam 07/16/2013  . Other screening mammogram 06/22/2011  . Intertrigo 06/22/2011  . KNEE PAIN, RIGHT 06/03/2010  . HYPERGLYCEMIA, FASTING 05/14/2009  . RHEUMATOID ARTHRITIS 05/13/2008  . ELEVATED BP W/O HYPERTENSION 05/13/2008  . PAIN IN JOINT, HAND 06/13/2007  . OSTEOPENIA 03/07/2007  . HYPERLIPIDEMIA 01/03/2007  . OBESITY 01/03/2007  . ALLERGIC RHINITIS, SEASONAL 01/03/2007  . ASTHMA 01/03/2007  . OVERACTIVE BLADDER 01/03/2007   Past Medical History  Diagnosis Date  . HPV in female 1996    HPV with colposcopy (all neg paps since)  . Hyperlipidemia   . Asthma   . Osteopenia   . Obesity   . Arthritis     RA hands(seronegative)  . Back pain   . Shoulder pain    Past Surgical History  Procedure Laterality Date  . Tonsillectomy     History  Substance Use Topics  . Smoking status: Never Smoker   . Smokeless tobacco:  Not on file  . Alcohol Use: No   Family History  Problem Relation Age of Onset  . Diabetes Mother   . Hyperlipidemia Mother   . Heart disease Mother   . Hypertension Mother   . Obesity Mother   . Kyphosis Mother   . Heart disease Father   . Hypertension Father   . Cancer Father     lung Cancer smoker  . Kyphosis Sister   . Diabetes Brother    Allergies  Allergen Reactions  . Ezetimibe-Simvastatin     REACTION: leg pain  . Tolterodine Tartrate     REACTION: side effects  . Vesicare [Solifenacin Succinate]     Eye problems    Current Outpatient Prescriptions on File Prior to Visit  Medication Sig Dispense Refill  . b complex vitamins tablet Take 1 tablet by mouth daily.    . Calcium Carbonate-Vitamin D (CALCIUM-VITAMIN D3 PO) Take 1 capsule by mouth daily.    Marland Kitchen glucosamine-chondroitin 500-400 MG tablet Take 1 tablet by mouth daily.    Marland Kitchen Hyaluronic Acid-Vitamin C (HYALURONIC ACID PO) Take 1 tablet by mouth daily.    . Methylsulfonylmethane (MSM PO) Take 1 tablet by mouth daily.    . montelukast (SINGULAIR) 10 MG tablet Take 1 tablet (10 mg total) by mouth at bedtime. 90 tablet 3  . Multiple Vitamin (  MULTIVITAMIN) capsule Take 1 capsule by mouth daily.    . Omega-3 Fatty Acids (FISH OIL PO) Take 2 capsules by mouth daily.      Marland Kitchen PROAIR HFA 108 (90 BASE) MCG/ACT inhaler INHALE TWO PUFFS BY MOUTH INTO THE LUNGS EVERY 4 HOURS AS NEEDED FOR WHEEZING AND SHORTNESS OF BREATH 9 each 5  . Pumpkin Seed-Soy Germ (AZO BLADDER CONTROL/GO-LESS PO) Take by mouth.    . simvastatin (ZOCOR) 40 MG tablet Take 1 tablet (40 mg total) by mouth at bedtime. 30 tablet 11  . Ubiquinol 100 MG CAPS Take 1 capsule by mouth 3 (three) times a week.    . zafirlukast (ACCOLATE) 10 MG tablet Take 1 tablet (10 mg total) by mouth 2 (two) times daily. 60 tablet 11   No current facility-administered medications on file prior to visit.     Review of Systems Review of Systems  Constitutional: Negative for  fever, appetite change, fatigue and unexpected weight change.  Eyes: Negative for pain and visual disturbance.  Respiratory: Negative for cough and shortness of breath.   Cardiovascular: Negative for cp or palpitations    Gastrointestinal: Negative for nausea, diarrhea and constipation.  Genitourinary: pos for urgency and frequency. pos for urge incontinence  Skin: Negative for pallor and pos for  rash   Neurological: Negative for weakness, light-headedness, numbness and headaches.  Hematological: Negative for adenopathy. Does not bruise/bleed easily.  Psychiatric/Behavioral: Negative for dysphoric mood. The patient is not nervous/anxious.         Objective:   Physical Exam  Constitutional: She appears well-developed and well-nourished. No distress.  obese and well appearing   HENT:  Head: Normocephalic and atraumatic.  Mouth/Throat: Oropharynx is clear and moist.  Eyes: Conjunctivae and EOM are normal. Pupils are equal, round, and reactive to light.  Neck: Normal range of motion. Neck supple.  Cardiovascular: Normal rate, regular rhythm and normal heart sounds.   Pulmonary/Chest: Effort normal and breath sounds normal. No respiratory distress. She has no wheezes. She has no rales.  Abdominal: Soft. Bowel sounds are normal. She exhibits no distension and no mass. There is no tenderness. There is no rebound and no guarding.  No suprapubic tenderness or fullness    Musculoskeletal: She exhibits no edema.  No cva tenderness   Lymphadenopathy:    She has no cervical adenopathy.  Neurological: She is alert. She has normal reflexes. No cranial nerve deficit. She exhibits normal muscle tone.  Skin: Skin is warm and dry. Rash noted. There is erythema.  Intertrigo in skin folds of thighs with well demarcated erythema and satellite lesions No skin breakdown  Psychiatric: She has a normal mood and affect.          Assessment & Plan:   Problem List Items Addressed This Visit       Musculoskeletal and Integument   Intertrigo - Primary    Px ketoconazole cream that worked before  In creases of thighs/occ under breasts  inst to keep it very dry otherwise Update if not starting to improve in a week or if worsening        Genitourinary   OVERACTIVE BLADDER    With some urge incontinence oxytrol no longer otc  Will px it and see if affordable  If not-trial of oxybutinin oral would be reasonable  Then urol ref if not imp

## 2014-06-12 NOTE — Progress Notes (Signed)
Pre visit review using our clinic review tool, if applicable. No additional management support is needed unless otherwise documented below in the visit note. 

## 2014-06-12 NOTE — Patient Instructions (Signed)
Call a few pharmacies to see what cost/coverage would be for oxytrol patch  If not covered -let us know and we can try oral oxybutinin  Drink water and avoid other beverages   Use ketoconazole cream on rash Keep it clean and dry  Update if not starting to improve in a week or if worsening

## 2014-07-31 ENCOUNTER — Encounter: Payer: Commercial Managed Care - HMO | Admitting: Family Medicine

## 2014-08-05 ENCOUNTER — Other Ambulatory Visit: Payer: Self-pay | Admitting: Family Medicine

## 2014-08-13 ENCOUNTER — Telehealth: Payer: Self-pay | Admitting: Family Medicine

## 2014-08-13 DIAGNOSIS — E785 Hyperlipidemia, unspecified: Secondary | ICD-10-CM

## 2014-08-13 DIAGNOSIS — Z0000 Encounter for general adult medical examination without abnormal findings: Secondary | ICD-10-CM

## 2014-08-13 DIAGNOSIS — R739 Hyperglycemia, unspecified: Secondary | ICD-10-CM

## 2014-08-13 DIAGNOSIS — M8580 Other specified disorders of bone density and structure, unspecified site: Secondary | ICD-10-CM

## 2014-08-13 NOTE — Telephone Encounter (Signed)
-----   Message from Ellamae Sia sent at 08/08/2014  2:38 PM EST ----- Regarding: Lab orders for Wednesday, 1.20.16 Patient is scheduled for CPX labs, please order future labs, Thanks , Karna Christmas

## 2014-08-14 ENCOUNTER — Other Ambulatory Visit (INDEPENDENT_AMBULATORY_CARE_PROVIDER_SITE_OTHER): Payer: Commercial Managed Care - HMO

## 2014-08-14 DIAGNOSIS — R739 Hyperglycemia, unspecified: Secondary | ICD-10-CM | POA: Diagnosis not present

## 2014-08-14 DIAGNOSIS — E785 Hyperlipidemia, unspecified: Secondary | ICD-10-CM | POA: Diagnosis not present

## 2014-08-14 DIAGNOSIS — M8580 Other specified disorders of bone density and structure, unspecified site: Secondary | ICD-10-CM

## 2014-08-14 LAB — LIPID PANEL
CHOL/HDL RATIO: 4
Cholesterol: 181 mg/dL (ref 0–200)
HDL: 44 mg/dL (ref >39.00)
LDL CALC: 102 mg/dL — AB (ref 0–99)
NonHDL: 137
Triglycerides: 176 mg/dL — ABNORMAL HIGH (ref 0.0–149.0)
VLDL: 35.2 mg/dL (ref 0.0–40.0)

## 2014-08-14 LAB — HEMOGLOBIN A1C: Hgb A1c MFr Bld: 6.1 % (ref 4.6–6.5)

## 2014-08-14 LAB — CBC WITH DIFFERENTIAL/PLATELET
Basophils Absolute: 0.1 10*3/uL (ref 0.0–0.1)
Basophils Relative: 0.9 % (ref 0.0–3.0)
EOS ABS: 0.2 10*3/uL (ref 0.0–0.7)
EOS PCT: 3 % (ref 0.0–5.0)
HCT: 44 % (ref 36.0–46.0)
Hemoglobin: 15.2 g/dL — ABNORMAL HIGH (ref 12.0–15.0)
LYMPHS ABS: 2 10*3/uL (ref 0.7–4.0)
Lymphocytes Relative: 36.7 % (ref 12.0–46.0)
MCHC: 34.5 g/dL (ref 30.0–36.0)
MCV: 92.6 fl (ref 78.0–100.0)
MONO ABS: 0.5 10*3/uL (ref 0.1–1.0)
Monocytes Relative: 9.5 % (ref 3.0–12.0)
NEUTROS PCT: 49.9 % (ref 43.0–77.0)
Neutro Abs: 2.8 10*3/uL (ref 1.4–7.7)
Platelets: 202 10*3/uL (ref 150.0–400.0)
RBC: 4.76 Mil/uL (ref 3.87–5.11)
RDW: 13.4 % (ref 11.5–15.5)
WBC: 5.6 10*3/uL (ref 4.0–10.5)

## 2014-08-14 LAB — TSH: TSH: 2.47 u[IU]/mL (ref 0.35–4.50)

## 2014-08-14 LAB — COMPREHENSIVE METABOLIC PANEL
ALT: 12 U/L (ref 0–35)
AST: 17 U/L (ref 0–37)
Albumin: 3.6 g/dL (ref 3.5–5.2)
Alkaline Phosphatase: 74 U/L (ref 39–117)
BUN: 20 mg/dL (ref 6–23)
CHLORIDE: 108 meq/L (ref 96–112)
CO2: 31 mEq/L (ref 19–32)
Calcium: 9 mg/dL (ref 8.4–10.5)
Creatinine, Ser: 0.89 mg/dL (ref 0.40–1.20)
GFR: 65.79 mL/min (ref >60.00)
Glucose, Bld: 122 mg/dL — ABNORMAL HIGH (ref 70–99)
POTASSIUM: 4.4 meq/L (ref 3.5–5.1)
Sodium: 141 mEq/L (ref 135–145)
Total Bilirubin: 0.5 mg/dL (ref 0.2–1.2)
Total Protein: 6.4 g/dL (ref 6.0–8.3)

## 2014-08-14 LAB — VITAMIN D 25 HYDROXY (VIT D DEFICIENCY, FRACTURES): VITD: 29.52 ng/mL — ABNORMAL LOW (ref 30.00–100.00)

## 2014-08-19 ENCOUNTER — Encounter: Payer: Self-pay | Admitting: Family Medicine

## 2014-08-19 ENCOUNTER — Ambulatory Visit (INDEPENDENT_AMBULATORY_CARE_PROVIDER_SITE_OTHER): Payer: Commercial Managed Care - HMO | Admitting: Family Medicine

## 2014-08-19 VITALS — BP 132/76 | HR 67 | Temp 98.3°F | Ht 63.0 in | Wt 201.8 lb

## 2014-08-19 DIAGNOSIS — Z23 Encounter for immunization: Secondary | ICD-10-CM | POA: Diagnosis not present

## 2014-08-19 DIAGNOSIS — Z0000 Encounter for general adult medical examination without abnormal findings: Secondary | ICD-10-CM

## 2014-08-19 DIAGNOSIS — M8580 Other specified disorders of bone density and structure, unspecified site: Secondary | ICD-10-CM

## 2014-08-19 DIAGNOSIS — Z1211 Encounter for screening for malignant neoplasm of colon: Secondary | ICD-10-CM

## 2014-08-19 DIAGNOSIS — R739 Hyperglycemia, unspecified: Secondary | ICD-10-CM

## 2014-08-19 DIAGNOSIS — E785 Hyperlipidemia, unspecified: Secondary | ICD-10-CM

## 2014-08-19 DIAGNOSIS — E669 Obesity, unspecified: Secondary | ICD-10-CM

## 2014-08-19 MED ORDER — SIMVASTATIN 40 MG PO TABS: 40.0000 mg | ORAL_TABLET | Freq: Every day | ORAL | Status: DC

## 2014-08-19 MED ORDER — MONTELUKAST SODIUM 10 MG PO TABS: 10.0000 mg | ORAL_TABLET | Freq: Every day | ORAL | Status: DC

## 2014-08-19 MED ORDER — ALBUTEROL SULFATE HFA 108 (90 BASE) MCG/ACT IN AERS: 2.0000 | INHALATION_SPRAY | RESPIRATORY_TRACT | Status: DC | PRN

## 2014-08-19 NOTE — Assessment & Plan Note (Signed)
Discussed how this problem influences overall health and the risks it imposes  Reviewed plan for weight loss with lower calorie diet (via better food choices and also portion control or program like weight watchers) and exercise building up to or more than 30 minutes 5 days per week including some aerobic activity    

## 2014-08-19 NOTE — Assessment & Plan Note (Signed)
Reviewed health habits including diet and exercise and skin cancer prevention Reviewed appropriate screening tests for age  Also reviewed health mt list, fam hx and immunization status , as well as social and family history   See HPI Given info on Advanced directive to look at and complete - she does not have this yet prevnar vaccine  IFOB card She will schedule own mammogram

## 2014-08-19 NOTE — Progress Notes (Signed)
Subjective:    Patient ID: Cheryl Joseph, female    DOB: [birth year], 74 y.o.   MRN: [id number]  HPI Here for annual medicare wellness visit as well as acute/chronic health problems   I have personally reviewed the Medicare Annual Wellness questionnaire and have noted 1. The patient's medical and social history 2. Their use of alcohol, tobacco or illicit drugs 3. Their current medications and supplements 4. The patient's functional ability including ADL's, fall risks, home safety risks and hearing or visual             impairment. 5. Diet and physical activities 6. Evidence for depression or mood disorders  The patients weight, height, BMI have been recorded in the chart and visual acuity is per eye clinic.  I have made referrals, counseling and provided education to the patient based review of the above and I have provided the pt with a written personalized care plan for preventive services.  Has been feeling ok overall but more problems with her asthma lately  More problems when she is in very cold air or with exercise  Has not been using her albuterol inhaler very often -and needs to change to ventolin hfa   She had surgery for her overactive bladder - did not help   See scanned forms.  Routine anticipatory guidance given to patient.  See health maintenance. Colon cancer screening colonosc 11/05- she did not get a f/u yet - will think about it  Breast cancer screening mammogram 3/15 - will make her own appt in March  Self breast exam -no lumps  Flu vaccine 10/15 Tetanus vaccine 10/10 Pneumovax 11/06 , wants prevnar today  Zoster vaccine 8/09  Advance directive- she does not have one drawn up yet/ has a new husband  Cognitive function addressed- see scanned forms- and if abnormal then additional documentation follows.  No concerns about this   PMH and SH reviewed  Meds, vitals, and allergies reviewed.   ROS: See HPI.  Otherwise negative.    Wt is stable with bmi of  35 Having a hard time with exercise since her urol surgery - she tries to use cycle 30 min per day- enjoys hiking when she can  Eats very healthy diet as well (no refined sugar or dairy) - she eats healthy low fat protein   Hyperlipidemia Lab Results  Component Value Date   CHOL 181 08/14/2014   CHOL 187 07/11/2013   CHOL 190 12/26/2012   Lab Results  Component Value Date   HDL 44.00 08/14/2014   HDL 40.20 07/11/2013   HDL 36.70* 12/26/2012   Lab Results  Component Value Date   LDLCALC 102* 08/14/2014   LDLCALC 107* 07/11/2013   LDLCALC 112* 06/16/2012   Lab Results  Component Value Date   TRIG 176.0* 08/14/2014   TRIG 197.0* 07/11/2013   TRIG 288.0* 12/26/2012   Lab Results  Component Value Date   CHOLHDL 4 08/14/2014   CHOLHDL 5 07/11/2013   CHOLHDL 5 12/26/2012   Lab Results  Component Value Date   LDLDIRECT 105.9 12/26/2012   LDLDIRECT 98.5 09/15/2011   LDLDIRECT 141.3 06/11/2011   this is mildly improved overall    Hyperglycemia Lab Results  Component Value Date   HGBA1C 6.1 08/14/2014   Up from 5.9 Not eating refined sugars - holidays not as strict    Osteopenia  1/12 dexa improved  D level is 29 She will think about whether she wants another bone dexa  She takes  1000 iu daily  No falls or fractures   Lab Results  Component Value Date   WBC 5.6 08/14/2014   HGB 15.2* 08/14/2014   HCT 44.0 08/14/2014   MCV 92.6 08/14/2014   PLT 202.0 08/14/2014      Chemistry      Component Value Date/Time   NA 141 08/14/2014 0754   K 4.4 08/14/2014 0754   CL 108 08/14/2014 0754   CO2 31 08/14/2014 0754   BUN 20 08/14/2014 0754   CREATININE 0.89 08/14/2014 0754      Component Value Date/Time   CALCIUM 9.0 08/14/2014 0754   ALKPHOS 74 08/14/2014 0754   AST 17 08/14/2014 0754   ALT 12 08/14/2014 0754   BILITOT 0.5 08/14/2014 0754      Lab Results  Component Value Date   TSH 2.47 08/14/2014     Patient Active Problem List   Diagnosis Date  Noted  . Diverticulitis of colon without hemorrhage 05/25/2014  . Abdominal pain, left lower quadrant 03/18/2014  . Other malaise and fatigue 02/25/2014  . Labile blood pressure 02/25/2014  . Encounter for Medicare annual wellness exam 07/16/2013  . Other screening mammogram 06/22/2011  . Intertrigo 06/22/2011  . KNEE PAIN, RIGHT 06/03/2010  . Hyperglycemia 05/14/2009  . RHEUMATOID ARTHRITIS 05/13/2008  . ELEVATED BP W/O HYPERTENSION 05/13/2008  . PAIN IN JOINT, HAND 06/13/2007  . Osteopenia 03/07/2007  . Hyperlipidemia 01/03/2007  . Obesity 01/03/2007  . ALLERGIC RHINITIS, SEASONAL 01/03/2007  . ASTHMA 01/03/2007  . OVERACTIVE BLADDER 01/03/2007   Past Medical History  Diagnosis Date  . HPV in female 1996    HPV with colposcopy (all neg paps since)  . Hyperlipidemia   . Asthma   . Osteopenia   . Obesity   . Arthritis     RA hands(seronegative)  . Back pain   . Shoulder pain    Past Surgical History  Procedure Laterality Date  . Tonsillectomy     History  Substance Use Topics  . Smoking status: Never Smoker   . Smokeless tobacco: Not on file  . Alcohol Use: No   Family History  Problem Relation Age of Onset  . Diabetes Mother   . Hyperlipidemia Mother   . Heart disease Mother   . Hypertension Mother   . Obesity Mother   . Kyphosis Mother   . Heart disease Father   . Hypertension Father   . Cancer Father     lung Cancer smoker  . Kyphosis Sister   . Diabetes Brother    Allergies  Allergen Reactions  . Ezetimibe-Simvastatin     REACTION: leg pain  . Tolterodine Tartrate     REACTION: side effects  . Vesicare [Solifenacin Succinate]     Eye problems    Current Outpatient Prescriptions on File Prior to Visit  Medication Sig Dispense Refill  . b complex vitamins tablet Take 1 tablet by mouth daily.    . Calcium Carbonate-Vitamin D (CALCIUM-VITAMIN D3 PO) Take 1 capsule by mouth daily.    Marland Kitchen glucosamine-chondroitin 500-400 MG tablet Take 1 tablet by  mouth daily.    Marland Kitchen Hyaluronic Acid-Vitamin C (HYALURONIC ACID PO) Take 1 tablet by mouth daily.    . montelukast (SINGULAIR) 10 MG tablet Take 1 tablet (10 mg total) by mouth at bedtime. 90 tablet 3  . Multiple Vitamin (MULTIVITAMIN) capsule Take 1 capsule by mouth daily.    . NON FORMULARY Take 1 capsule by mouth 2 (two) times daily. [place]    .  Omega-3 Fatty Acids (FISH OIL PO) Take 2 capsules by mouth daily.      Marland Kitchen oxybutynin (OXYTROL) 3.9 MG/24HR Place 1 patch onto the skin every 3 (three) days. 8 patch 12  . PROAIR HFA 108 (90 BASE) MCG/ACT inhaler INHALE TWO PUFFS BY MOUTH INTO THE LUNGS EVERY 4 HOURS AS NEEDED FOR WHEEZING AND SHORTNESS OF BREATH 9 each 5  . simvastatin (ZOCOR) 40 MG tablet TAKE ONE TABLET BY MOUTH AT BEDTIME 30 tablet 0  . Ubiquinol 100 MG CAPS Take 1 capsule by mouth 3 (three) times a week.    Marland Kitchen ketoconazole (NIZORAL) 2 % cream Apply 1 application topically daily. To affected area (Patient not taking: Reported on 08/19/2014) 60 g 1   No current facility-administered medications on file prior to visit.       Review of Systems    Review of Systems  Constitutional: Negative for fever, appetite change, fatigue and unexpected weight change.  Eyes: Negative for pain and visual disturbance.  Respiratory: Negative for cough and shortness of breath.   Cardiovascular: Negative for cp or palpitations    Gastrointestinal: Negative for nausea, diarrhea and constipation.  Genitourinary: Negative for urgency and frequency.  Skin: Negative for pallor or rash   Neurological: Negative for weakness, light-headedness, numbness and headaches.  Hematological: Negative for adenopathy. Does not bruise/bleed easily.  Psychiatric/Behavioral: Negative for dysphoric mood. The patient is not nervous/anxious.      Objective:   Physical Exam  Constitutional: She appears well-developed and well-nourished. No distress.  obese and well appearing   HENT:  Head: Normocephalic and atraumatic.    Right Ear: External ear normal.  Left Ear: External ear normal.  Mouth/Throat: Oropharynx is clear and moist.  Eyes: Conjunctivae and EOM are normal. Pupils are equal, round, and reactive to light. No scleral icterus.  Neck: Normal range of motion. Neck supple. No JVD present. Carotid bruit is not present. No thyromegaly present.  Cardiovascular: Normal rate, regular rhythm, normal heart sounds and intact distal pulses.  Exam reveals no gallop.   Pulmonary/Chest: Effort normal and breath sounds normal. No respiratory distress. She has no wheezes. She exhibits no tenderness.  No wheeze even on forced exp  Abdominal: Soft. Bowel sounds are normal. She exhibits no distension, no abdominal bruit and no mass. There is no tenderness.  Genitourinary: No breast swelling, tenderness, discharge or bleeding.  Breast exam: No mass, nodules, thickening, tenderness, bulging, retraction, inflamation, nipple discharge or skin changes noted.  No axillary or clavicular LA.      Musculoskeletal: Normal range of motion. She exhibits no edema or tenderness.  Lymphadenopathy:    She has no cervical adenopathy.  Neurological: She is alert. She has normal reflexes. No cranial nerve deficit. She exhibits normal muscle tone. Coordination normal.  Skin: Skin is warm and dry. No rash noted. No erythema. No pallor.  Psychiatric: She has a normal mood and affect.          Assessment & Plan:   Problem List Items Addressed This Visit      Musculoskeletal and Integument   Osteopenia    She is not ready to schedule dexa yet  Disc ca and D- inc D to 3000 iu daily for def  Disc need for calcium/ vitamin D/ wt bearing exercise and bone density test every 2 y to monitor Disc safety/ fracture risk in detail   No falls or fx         Other   Colon cancer screening  D/w patient [id number] for colon cancer screening, including IFOB vs. colonoscopy.  Risks and benefits of both were discussed and patient voiced  understanding.  Pt elects for: IFOB card        Relevant Orders   Fecal occult blood, imunochemical   Encounter for Medicare annual wellness exam - Primary    Reviewed health habits including diet and exercise and skin cancer prevention Reviewed appropriate screening tests for age  Also reviewed health mt list, fam hx and immunization status , as well as social and family history   See HPI Given info on Advanced directive to look at and complete - she does not have this yet prevnar vaccine  IFOB card She will schedule own mammogram        Hyperglycemia    Lab Results  Component Value Date   HGBA1C 6.1 08/14/2014   Disc imp of wt loss to prevent DM She does well with low sugar diet most of the time       Hyperlipidemia    Lab Results  Component Value Date   CHOL 181 08/14/2014   HDL 44.00 08/14/2014   LDLCALC 102* 08/14/2014   LDLDIRECT 105.9 12/26/2012   TRIG 176.0* 08/14/2014   CHOLHDL 4 08/14/2014   On statin and diet  Disc goals for lipids and reasons to control them Rev labs with pt Rev low sat fat diet in detail       Relevant Medications   simvastatin (ZOCOR) tablet   Obesity    Discussed how this problem influences overall health and the risks it imposes  Reviewed plan for weight loss with lower calorie diet (via better food choices and also portion control or program like weight watchers) and exercise building up to or more than 30 minutes 5 days per week including some aerobic activity          Other Visit Diagnoses    Need for pneumococcal vaccination        Relevant Orders    Pneumococcal conjugate vaccine 13-valent IM (Completed)

## 2014-08-19 NOTE — Progress Notes (Signed)
Pre visit review using our clinic review tool, if applicable. No additional management support is needed unless otherwise documented below in the visit note. 

## 2014-08-19 NOTE — Assessment & Plan Note (Signed)
D/w patient re:options for colon cancer screening, including IFOB vs. colonoscopy.  Risks and benefits of both were discussed and patient voiced understanding.  Pt elects for:IFOB card  

## 2014-08-19 NOTE — Assessment & Plan Note (Signed)
Lab Results  Component Value Date   HGBA1C 6.1 08/14/2014   Disc imp of wt loss to prevent DM She does well with low sugar diet most of the time

## 2014-08-19 NOTE — Assessment & Plan Note (Signed)
Lab Results  Component Value Date   CHOL 181 08/14/2014   HDL 44.00 08/14/2014   LDLCALC 102* 08/14/2014   LDLDIRECT 105.9 12/26/2012   TRIG 176.0* 08/14/2014   CHOLHDL 4 08/14/2014   On statin and diet  Disc goals for lipids and reasons to control them Rev labs with pt Rev low sat fat diet in detail

## 2014-08-19 NOTE — Assessment & Plan Note (Signed)
She is not ready to schedule dexa yet  Disc ca and D- inc D to 3000 iu daily for def  Disc need for calcium/ vitamin D/ wt bearing exercise and bone density test every 2 y to monitor Disc safety/ fracture risk in detail   No falls or fx

## 2014-08-19 NOTE — Patient Instructions (Signed)
Please do IFOB stool card for colon cancer screening  Schedule your own mammogram- you are due in march  prevnar vaccine today  Increase your vitamin D to 3000 iu daily  Take care of yourself- keep working on diet and exercise and weight loss

## 2014-08-30 ENCOUNTER — Telehealth: Payer: Self-pay | Admitting: Family Medicine

## 2014-08-30 ENCOUNTER — Emergency Department: Payer: Self-pay | Admitting: Emergency Medicine

## 2014-08-30 LAB — COMPREHENSIVE METABOLIC PANEL
ALT: 21 U/L (ref 14–63)
AST: 26 U/L (ref 15–37)
Albumin: 3.3 g/dL — ABNORMAL LOW (ref 3.4–5.0)
Alkaline Phosphatase: 85 U/L (ref 46–116)
Anion Gap: 6 — ABNORMAL LOW (ref 7–16)
BILIRUBIN TOTAL: 0.4 mg/dL (ref 0.2–1.0)
BUN: 16 mg/dL (ref 7–18)
CHLORIDE: 107 mmol/L (ref 98–107)
CREATININE: 1.01 mg/dL (ref 0.60–1.30)
Calcium, Total: 8.5 mg/dL (ref 8.5–10.1)
Co2: 28 mmol/L (ref 21–32)
EGFR (Non-African Amer.): 57 — ABNORMAL LOW
Glucose: 136 mg/dL — ABNORMAL HIGH (ref 65–99)
Osmolality: 285 (ref 275–301)
Potassium: 4.1 mmol/L (ref 3.5–5.1)
Sodium: 141 mmol/L (ref 136–145)
Total Protein: 7.2 g/dL (ref 6.4–8.2)

## 2014-08-30 LAB — CBC
HCT: 45.6 % (ref 35.0–47.0)
HGB: 15.2 g/dL (ref 12.0–16.0)
MCH: 31.6 pg (ref 26.0–34.0)
MCHC: 33.4 g/dL (ref 32.0–36.0)
MCV: 94 fL (ref 80–100)
PLATELETS: 229 10*3/uL (ref 150–440)
RBC: 4.82 10*6/uL (ref 3.80–5.20)
RDW: 13.1 % (ref 11.5–14.5)
WBC: 6.8 10*3/uL (ref 3.6–11.0)

## 2014-08-30 LAB — CK TOTAL AND CKMB (NOT AT ARMC)
CK, TOTAL: 43 U/L (ref 26–192)
CK-MB: <0.5 ng/mL — ABNORMAL LOW (ref 0.5–3.6)

## 2014-08-30 LAB — TROPONIN I: Troponin-I: <0.02 ng/mL

## 2014-08-30 NOTE — Telephone Encounter (Signed)
Agree with advisement to go to UC since symptoms are bad enough-I do not want her to wait

## 2014-08-30 NOTE — Telephone Encounter (Signed)
Patient Name: Cheryl Joseph DOB: 1941-03-27 Initial Comment Caller states she woke up this morning with chest pains and wheezing. Still having chest pains and feeling weak. Nurse Assessment Nurse: Mechele Dawley, RN, Amy Date/Time Eilene Ghazi Time): 08/30/2014 11:42:41 AM Confirm and document reason for call. If symptomatic, describe symptoms. ---SHE WOKE UP THIS MORNING AND WAS HAVING CHEST PAINS AND WHEEZING. NO COUGH OR CONGESTION. DENIES ANY HEART PROBLEMS. SHE DOES HAVE ASTHMA. SHE STATES THAT THE WHEEZING WAS VERY LOUD WHEN SHE WOKE UP. SHE TAKES A PILL AT NIGHT AND THEN SHE HAS THE INHALER. THE WHEEZING HAS STOPPED AND THE PAIN IN THE CHEST IS ACHE AND SHE FEELS WEAK. SHE HAS NOT HAD AN EPISODE WHERE SHE WOKE UP IN MIDDLE OF THE NIGHT. Has the patient traveled out of the country within the last 30 days? ---Not Applicable Does the patient require triage? ---Yes Related visit to physician within the last 2 weeks? ---Yes Does the PT have any chronic conditions? (i.e. diabetes, asthma, etc.) ---Yes List chronic conditions. ---ASTHMA Guidelines Guideline Title Affirmed Question Affirmed Notes Asthma Attack [1] MODERATE asthma attack (e.g., SOB at rest, speaks in phrases, audible wheezes) AND [2] not resolved after 2 nebulizer or inhaler treatments given 20 minutes apart Final Disposition User Go to ED Now (or PCP triage) [place], RN, Amy Comments checked to see if there were any appt's available with pcp and there are no openings today. she needs to be seen and will go into the urgent care locally.

## 2014-10-14 ENCOUNTER — Encounter: Payer: Self-pay | Admitting: Family Medicine

## 2014-10-14 ENCOUNTER — Ambulatory Visit: Payer: Self-pay | Admitting: Family Medicine

## 2014-10-14 ENCOUNTER — Ambulatory Visit (INDEPENDENT_AMBULATORY_CARE_PROVIDER_SITE_OTHER): Payer: Medicare PPO | Admitting: Family Medicine

## 2014-10-14 VITALS — BP 118/70 | HR 62 | Temp 98.4°F | Ht 63.0 in | Wt 203.0 lb

## 2014-10-14 DIAGNOSIS — J209 Acute bronchitis, unspecified: Secondary | ICD-10-CM

## 2014-10-14 MED ORDER — BENZONATATE 200 MG PO CAPS: 200.0000 mg | ORAL_CAPSULE | Freq: Three times a day (TID) | ORAL | Status: DC | PRN

## 2014-10-14 NOTE — Assessment & Plan Note (Signed)
Cyclic cough s/p viral uri 5 wk in pt with asthma  tx with tessalon to break the cough cycle-she declines anything stronger for night time  I recommended prednisone low dose taper-but pt declined for now- will call back if worse or no improvement Overall reassuring exam

## 2014-10-14 NOTE — Progress Notes (Signed)
Pre visit review using our clinic review tool, if applicable. No additional management support is needed unless otherwise documented below in the visit note. 

## 2014-10-14 NOTE — Progress Notes (Signed)
Subjective:    Patient ID: Cheryl Joseph, female    DOB: 02/19/1941, 74 y.o.   MRN: [id number]  HPI Here with cough and congestion for 5 weeks  Started as a viral illness   Taking mucinex  This am - alka selzer plus  Cannot get rid of cough- it sounds bronchial  Feels junky but nothing is coming up   Nose - is congested off and on  Throat - a little sore in the am  Ears - hurt off and on  Face has hurt on and off - ? Sinus inf /ok this am   Clear nasal drainage   No fever   Patient Active Problem List   Diagnosis Date Noted  . Colon cancer screening 08/19/2014  . Diverticulitis of colon without hemorrhage 05/25/2014  . Abdominal pain, left lower quadrant 03/18/2014  . Other malaise and fatigue 02/25/2014  . Labile blood pressure 02/25/2014  . Encounter for Medicare annual wellness exam 07/16/2013  . Other screening mammogram 06/22/2011  . Intertrigo 06/22/2011  . KNEE PAIN, RIGHT 06/03/2010  . Hyperglycemia 05/14/2009  . RHEUMATOID ARTHRITIS 05/13/2008  . ELEVATED BP W/O HYPERTENSION 05/13/2008  . PAIN IN JOINT, HAND 06/13/2007  . Osteopenia 03/07/2007  . Hyperlipidemia 01/03/2007  . Obesity 01/03/2007  . ALLERGIC RHINITIS, SEASONAL 01/03/2007  . ASTHMA 01/03/2007  . OVERACTIVE BLADDER 01/03/2007   Past Medical History  Diagnosis Date  . HPV in female 1996    HPV with colposcopy (all neg paps since)  . Hyperlipidemia   . Asthma   . Osteopenia   . Obesity   . Arthritis     RA hands(seronegative)  . Back pain   . Shoulder pain    Past Surgical History  Procedure Laterality Date  . Tonsillectomy     History  Substance Use Topics  . Smoking status: Never Smoker   . Smokeless tobacco: Not on file  . Alcohol Use: No   Family History  Problem Relation Age of Onset  . Diabetes Mother   . Hyperlipidemia Mother   . Heart disease Mother   . Hypertension Mother   . Obesity Mother   . Kyphosis Mother   . Heart disease Father   . Hypertension Father    . Cancer Father     lung Cancer smoker  . Kyphosis Sister   . Diabetes Brother    Allergies  Allergen Reactions  . Ezetimibe-Simvastatin     REACTION: leg pain  . Tolterodine Tartrate     REACTION: side effects  . Vesicare [Solifenacin Succinate]     Eye problems    Current Outpatient Prescriptions on File Prior to Visit  Medication Sig Dispense Refill  . albuterol (PROVENTIL HFA;VENTOLIN HFA) 108 (90 BASE) MCG/ACT inhaler Inhale 2 puffs into the lungs every 4 (four) hours as needed for wheezing (and 2 puffs before exercise or exposure to cold air). 1 Inhaler 11  . b complex vitamins tablet Take 1 tablet by mouth daily.    . Calcium Carbonate-Vitamin D (CALCIUM-VITAMIN D3 PO) Take 1 capsule by mouth daily.    Marland Kitchen glucosamine-chondroitin 500-400 MG tablet Take 1 tablet by mouth daily.    Marland Kitchen Hyaluronic Acid-Vitamin C (HYALURONIC ACID PO) Take 1 tablet by mouth daily.    Marland Kitchen ketoconazole (NIZORAL) 2 % cream Apply 1 application topically daily. To affected area 60 g 1  . montelukast (SINGULAIR) 10 MG tablet Take 1 tablet (10 mg total) by mouth at bedtime. 90 tablet 3  .  Multiple Vitamin (MULTIVITAMIN) capsule Take 1 capsule by mouth daily.    . NON FORMULARY Take 1 capsule by mouth 2 (two) times daily. ARGO    . Omega-3 Fatty Acids (FISH OIL PO) Take 2 capsules by mouth daily.      Marland Kitchen oxybutynin (OXYTROL) 3.9 MG/24HR Place 1 patch onto the skin every 3 (three) days. 8 patch 12  . simvastatin (ZOCOR) 40 MG tablet Take 1 tablet (40 mg total) by mouth at bedtime. 90 tablet 3  . Ubiquinol 100 MG CAPS Take 1 capsule by mouth 3 (three) times a week.     No current facility-administered medications on file prior to visit.       Review of Systems Review of Systems  Constitutional: Negative for fever, appetite change, fatigue and unexpected weight change.  ENT pos for mild all rhinitis, neg for facial tenderness  Eyes: Negative for pain and visual disturbance.  Respiratory: Negative for  shortness of breath. Pos for mild wheeze   Cardiovascular: Negative for cp or palpitations    Gastrointestinal: Negative for nausea, diarrhea and constipation. (pos for urgent stools when eating out) Genitourinary: Negative for urgency and frequency.  Skin: Negative for pallor or rash   Neurological: Negative for weakness, light-headedness, numbness and headaches.  Hematological: Negative for adenopathy. Does not bruise/bleed easily.  Psychiatric/Behavioral: Negative for dysphoric mood. The patient is not nervous/anxious.         Objective:   Physical Exam  Constitutional: She appears well-developed and well-nourished. No distress.  obese and well appearing   HENT:  Head: Normocephalic and atraumatic.  Right Ear: External ear normal.  Left Ear: External ear normal.  Mouth/Throat: Oropharynx is clear and moist. No oropharyngeal exudate.  Nares are boggy Mild clear rhinorrhea  No sinus tenderness   Eyes: Conjunctivae and EOM are normal. Pupils are equal, round, and reactive to light. Right eye exhibits no discharge. Left eye exhibits no discharge.  Neck: Normal range of motion. Neck supple.  Cardiovascular: Normal rate and regular rhythm.   Pulmonary/Chest: Effort normal and breath sounds normal. No respiratory distress. She has no wheezes. She has no rales. She exhibits no tenderness.  Good air exch  Cough is deep and barky  Scant wheeze on forced exp only  Lymphadenopathy:    She has no cervical adenopathy.  Neurological: She is alert.  Skin: Skin is warm and dry. No rash noted. No pallor.  Psychiatric: She has a normal mood and affect.          Assessment & Plan:   Problem List Items Addressed This Visit      Respiratory   Acute bronchitis - Primary    Cyclic cough s/p viral uri 5 wk in pt with asthma  tx with tessalon to break the cough cycle-she declines anything stronger for night time  I recommended prednisone low dose taper-but pt declined for now- will call  back if worse or no improvement Overall reassuring exam

## 2014-10-14 NOTE — Patient Instructions (Signed)
I think you have a post viral cough (cyclic with mild bronchospasm)  Use your albuterol inhaler when you need it  Try tessalon three times daily as needed  If no improvement - call so we can start on low dose prednisone

## 2014-10-15 ENCOUNTER — Encounter: Payer: Self-pay | Admitting: Family Medicine

## 2014-10-16 ENCOUNTER — Telehealth: Payer: Self-pay

## 2014-10-16 NOTE — Telephone Encounter (Signed)
Patient aware of normal mammogram and recommendations.

## 2014-10-16 NOTE — Telephone Encounter (Signed)
-----   Message from Abner Greenspan, MD sent at 10/15/2014  5:26 PM EDT ----- Mammogram is normal  Please note for flow sheet if you can  Due for next screening mammogram in 1 year

## 2014-11-15 NOTE — Op Note (Signed)
PATIENT NAME:  Cheryl Joseph, Cheryl Joseph MR#:  [id number] DATE OF BIRTH:  18-Apr-1941  DATE OF PROCEDURE:  06/12/2013  PREOPERATIVE DIAGNOSIS: Visually significant cataract of the left eye.   POSTOPERATIVE DIAGNOSIS: Visually significant cataract of the left eye.   OPERATIVE PROCEDURE: Cataract extraction by phacoemulsification with implant of intraocular lens to the left eye.   SURGEON: Birder Robson, MD.   ANESTHESIA:  1. Managed anesthesia care.  2. Topical tetracaine drops followed by 2% Xylocaine jelly applied in the preoperative holding area.   COMPLICATIONS: None.   TECHNIQUE:  Stop and chop.   DESCRIPTION OF PROCEDURE: The patient was examined and consented in the preoperative holding area where the aforementioned topical anesthesia was applied to the left eye and then brought back to the Operating Room where the left eye was prepped and draped in the usual sterile ophthalmic fashion and a lid speculum was placed. A paracentesis was created with the side port blade and the anterior chamber was filled with viscoelastic. A near clear corneal incision was performed with the steel keratome. A continuous curvilinear capsulorrhexis was performed with a cystotome followed by the capsulorrhexis forceps. Hydrodissection and hydrodelineation were carried out with BSS on a blunt cannula. The lens was removed in a stop and chop technique and the remaining cortical material was removed with the irrigation-aspiration handpiece. The capsular bag was inflated with viscoelastic and the Tecnis ZCB00 23.0-diopter lens, serial number [id number] was placed in the capsular bag without complication. The remaining viscoelastic was removed from the eye with the irrigation-aspiration handpiece. The wounds were hydrated. The anterior chamber was flushed with Miostat and the eye was inflated to physiologic pressure. 0.1 mL of cefuroxime concentration 10 mg/mL was placed in the anterior chamber. The wounds were found to be  water tight. The eye was dressed with Vigamox. The patient was given protective glasses to wear throughout the day and a shield with which to sleep tonight. The patient was also given drops with which to begin a drop regimen today and will follow-up with me in one day.   ____________________________ Livingston Diones. Nekita Pita, MD wlp:gb D: 06/12/2013 20:43:44 ET T: 06/12/2013 21:28:17 ET JOB#: [id number]  cc: Aitanna Haubner L. Leeloo Silverthorne, MD, <Dictator> Livingston Diones Kjell Brannen MD ELECTRONICALLY SIGNED 06/13/2013 13:28

## 2014-11-15 NOTE — Op Note (Signed)
PATIENT NAME:  Cheryl Joseph, Cheryl Joseph MR#:  [id number] DATE OF BIRTH:  14-Jun-1941  DATE OF PROCEDURE:  03/27/2013  PREOPERATIVE DIAGNOSIS: Visually significant cataract of the right eye.   POSTOPERATIVE DIAGNOSIS: Visually significant cataract of the right eye.   OPERATIVE PROCEDURE: Cataract extraction by phacoemulsification with implant of intraocular lens to right eye.   SURGEON: Birder Robson, MD.   ANESTHESIA:  1. Managed anesthesia care.  2. Topical tetracaine drops followed by 2% Xylocaine jelly applied in the preoperative holding area.   COMPLICATIONS: None.   TECHNIQUE:  Stop and chop.   DESCRIPTION OF PROCEDURE: The patient was examined and consented in the preoperative holding area where the aforementioned topical anesthesia was applied to the right eye and then brought back to the Operating Room where the right eye was prepped and draped in the usual sterile ophthalmic fashion and a lid speculum was placed. A paracentesis was created with the side port blade and the anterior chamber was filled with viscoelastic. A near clear corneal incision was performed with the steel keratome. A continuous curvilinear capsulorrhexis was performed with a cystotome followed by the capsulorrhexis forceps. Hydrodissection and hydrodelineation were carried out with BSS on a blunt cannula. The lens was removed in a stop and chop technique and the remaining cortical material was removed with the irrigation-aspiration handpiece. The capsular bag was inflated with viscoelastic and the Tecnis ZCB00 23.5-diopter lens, serial number [id number] was placed in the capsular bag without complication. The remaining viscoelastic was removed from the eye with the irrigation-aspiration handpiece. The wounds were hydrated. The anterior chamber was flushed with Miostat and the eye was inflated to physiologic pressure. 0.1 mL of cefuroxime concentration 10 mg/mL was placed in the anterior chamber. The wounds were found to be  water tight. The eye was dressed with Vigamox. The patient was given protective glasses to wear throughout the day and a shield with which to sleep tonight. The patient was also given drops with which to begin a drop regimen today and will follow-up with me in one day.    ____________________________ Livingston Diones. Camaryn Lumbert, MD wlp:ea D: 03/27/2013 22:11:25 ET T: 03/27/2013 23:04:58 ET JOB#: [id number]  cc: La Dibella L. Javeah Loeza, MD, <Dictator> Livingston Diones Edgard Debord MD ELECTRONICALLY SIGNED 03/28/2013 11:51

## 2015-02-14 ENCOUNTER — Ambulatory Visit (INDEPENDENT_AMBULATORY_CARE_PROVIDER_SITE_OTHER): Payer: Medicare PPO | Admitting: Family Medicine

## 2015-02-14 ENCOUNTER — Encounter: Payer: Self-pay | Admitting: Family Medicine

## 2015-02-14 VITALS — BP 138/78 | HR 90 | Temp 98.0°F | Ht 63.0 in | Wt 202.1 lb

## 2015-02-14 DIAGNOSIS — R197 Diarrhea, unspecified: Secondary | ICD-10-CM | POA: Insufficient documentation

## 2015-02-14 DIAGNOSIS — K5730 Diverticulosis of large intestine without perforation or abscess without bleeding: Secondary | ICD-10-CM

## 2015-02-14 DIAGNOSIS — R1032 Left lower quadrant pain: Secondary | ICD-10-CM | POA: Diagnosis not present

## 2015-02-14 NOTE — Progress Notes (Signed)
Pre visit review using our clinic review tool, if applicable. No additional management support is needed unless otherwise documented below in the visit note. 

## 2015-02-14 NOTE — Patient Instructions (Signed)
Eat a regular diet but avoid foods that set you off (especially grease)  Drink lots of fluids  Keep a food journal  Get citrucel over the counter - take once daily mixed with water as directed - (this does help IBS and also diverticulosis) Stop at check out for referral to GI

## 2015-02-14 NOTE — Progress Notes (Signed)
Subjective:    Patient ID: Cheryl Joseph, female    DOB: 1940/11/28, 74 y.o.   MRN: [id number]  HPI Here with GI complaints  A lot of gas   Has episodes of urgent stool right after eating (loose stool)  Is now eating smaller portions   Had a bad episode on sat after eating greek food  Then felt bad/low abd pain - until about Thursday  Ate out again the following sat- and pain in LLQ mon/tues/wed  Had to lie around in the recliner   Now she also has abd pain after sexual activity   Gas ex helps a little   Went to UC in the past - thinking it was diverticulitis  Told to eat yogurt and take a probiotic - she is not always compliant with it   Has had problems on and off since her vaginal wall prolapse surg in April (did have hysterectomy)   Last colonoscopy was 10 years  No blood in stool No dark stool   Today pain is not as bad   Patient Active Problem List   Diagnosis Date Noted  . Acute bronchitis 10/14/2014  . Colon cancer screening 08/19/2014  . Diverticulitis of colon without hemorrhage 05/25/2014  . Abdominal pain, left lower quadrant 03/18/2014  . Other malaise and fatigue 02/25/2014  . Labile blood pressure 02/25/2014  . Encounter for Medicare annual wellness exam 07/16/2013  . Other screening mammogram 06/22/2011  . Intertrigo 06/22/2011  . KNEE PAIN, RIGHT 06/03/2010  . Hyperglycemia 05/14/2009  . RHEUMATOID ARTHRITIS 05/13/2008  . ELEVATED BP W/O HYPERTENSION 05/13/2008  . PAIN IN JOINT, HAND 06/13/2007  . Osteopenia 03/07/2007  . Hyperlipidemia 01/03/2007  . Obesity 01/03/2007  . ALLERGIC RHINITIS, SEASONAL 01/03/2007  . ASTHMA 01/03/2007  . OVERACTIVE BLADDER 01/03/2007   Past Medical History  Diagnosis Date  . HPV in female 1996    HPV with colposcopy (all neg paps since)  . Hyperlipidemia   . Asthma   . Osteopenia   . Obesity   . Arthritis     RA hands(seronegative)  . Back pain   . Shoulder pain    Past Surgical History  Procedure  Laterality Date  . Tonsillectomy     History  Substance Use Topics  . Smoking status: Never Smoker   . Smokeless tobacco: Not on file  . Alcohol Use: No   Family History  Problem Relation Age of Onset  . Diabetes Mother   . Hyperlipidemia Mother   . Heart disease Mother   . Hypertension Mother   . Obesity Mother   . Kyphosis Mother   . Heart disease Father   . Hypertension Father   . Cancer Father     lung Cancer smoker  . Kyphosis Sister   . Diabetes Brother    Allergies  Allergen Reactions  . Ezetimibe-Simvastatin     REACTION: leg pain  . Tolterodine Tartrate     REACTION: side effects  . Vesicare [Solifenacin Succinate]     Eye problems    Current Outpatient Prescriptions on File Prior to Visit  Medication Sig Dispense Refill  . albuterol (PROVENTIL HFA;VENTOLIN HFA) 108 (90 BASE) MCG/ACT inhaler Inhale 2 puffs into the lungs every 4 (four) hours as needed for wheezing (and 2 puffs before exercise or exposure to cold air). 1 Inhaler 11  . aspirin-sod bicarb-citric acid (ALKA-SELTZER) 325 MG TBEF tablet Take 325 mg by mouth every 6 (six) hours as needed.    Marland Kitchen b  complex vitamins tablet Take 1 tablet by mouth daily.    . benzonatate (TESSALON) 200 MG capsule Take 1 capsule (200 mg total) by mouth 3 (three) times daily as needed for cough (swallow whole, do not bite pill). 30 capsule 1  . Calcium Carbonate-Vitamin D (CALCIUM-VITAMIN D3 PO) Take 1 capsule by mouth daily.    Marland Kitchen glucosamine-chondroitin 500-400 MG tablet Take 1 tablet by mouth daily.    Marland Kitchen guaiFENesin (MUCINEX) 600 MG 12 hr tablet Take by mouth 2 (two) times daily.    Marland Kitchen Hyaluronic Acid-Vitamin C (HYALURONIC ACID PO) Take 1 tablet by mouth daily.    Marland Kitchen ketoconazole (NIZORAL) 2 % cream Apply 1 application topically daily. To affected area 60 g 1  . montelukast (SINGULAIR) 10 MG tablet Take 1 tablet (10 mg total) by mouth at bedtime. 90 tablet 3  . Multiple Vitamin (MULTIVITAMIN) capsule Take 1 capsule by mouth  daily.    . NON FORMULARY Take 1 capsule by mouth 2 (two) times daily. ARGO    . Omega-3 Fatty Acids (FISH OIL PO) Take 2 capsules by mouth daily.      Marland Kitchen oxybutynin (OXYTROL) 3.9 MG/24HR Place 1 patch onto the skin every 3 (three) days. 8 patch 12  . simvastatin (ZOCOR) 40 MG tablet Take 1 tablet (40 mg total) by mouth at bedtime. 90 tablet 3  . Ubiquinol 100 MG CAPS Take 1 capsule by mouth 3 (three) times a week.     No current facility-administered medications on file prior to visit.     Review of Systems Review of Systems  Constitutional: Negative for fever, appetite change, fatigue and unexpected weight change.  Eyes: Negative for pain and visual disturbance.  Respiratory: Negative for cough and shortness of breath.   Cardiovascular: Negative for cp or palpitations    Gastrointestinal: Negative for nausea, and constipation. neg for blood in stool or vomiting  Genitourinary: Negative for urgency and frequency.  Skin: Negative for pallor or rash   Neurological: Negative for weakness, light-headedness, numbness and headaches.  Hematological: Negative for adenopathy. Does not bruise/bleed easily.  Psychiatric/Behavioral: Negative for dysphoric mood. The patient is not nervous/anxious.         Objective:   Physical Exam  Constitutional: She appears well-developed and well-nourished. No distress.  obese and well appearing   HENT:  Head: Normocephalic and atraumatic.  Mouth/Throat: Oropharynx is clear and moist.  Eyes: Conjunctivae and EOM are normal. Pupils are equal, round, and reactive to light. No scleral icterus.  Neck: Normal range of motion. Neck supple.  Cardiovascular: Normal rate and regular rhythm.   Pulmonary/Chest: Effort normal and breath sounds normal.  Abdominal: Soft. Bowel sounds are normal. She exhibits no distension and no mass. There is no hepatosplenomegaly. There is tenderness in the left lower quadrant. There is no rigidity, no rebound, no guarding, no CVA  tenderness, no tenderness at McBurney's point and negative Murphy's sign.  Mild LLQ tenderness on deep palpation   Musculoskeletal: She exhibits no edema.  Lymphadenopathy:    She has no cervical adenopathy.  Neurological: She is alert.  Skin: Skin is warm and dry. No rash noted. No erythema. No pallor.  Psychiatric: She has a normal mood and affect.          Assessment & Plan:   Problem List Items Addressed This Visit    Abdominal pain, left lower quadrant    Suspect multifactorial Intermittent - better now  Some IBS symptoms  Pt thinks worse since pelvic surgery- ?  Poss adhesions Also hx of diverticulosis   Will begin daily fiber supplement  Ref to GI   Update if worse in meantime or if fever or blood in stool      Relevant Orders   Ambulatory referral to Gastroenterology   Diverticulosis of colon without hemorrhage    Intermittent LLQ pain with gas/no bleeding and no fever  Suggested a trial of fiber  Avoid nuts/seeds  Ref to GI- is due for a colonoscopy      Relevant Orders   Ambulatory referral to Gastroenterology   Frequent loose stools - Primary    With hx of diverticulosis/ and intermittent LLQ pain  Also IBS  Will begin fiber supplement and ref to GI      Relevant Orders   Ambulatory referral to Gastroenterology

## 2015-02-16 NOTE — Assessment & Plan Note (Signed)
With hx of diverticulosis/ and intermittent LLQ pain  Also IBS  Will begin fiber supplement and ref to GI

## 2015-02-16 NOTE — Assessment & Plan Note (Signed)
Intermittent LLQ pain with gas/no bleeding and no fever  Suggested a trial of fiber  Avoid nuts/seeds  Ref to GI- is due for a colonoscopy

## 2015-02-16 NOTE — Assessment & Plan Note (Signed)
Suspect multifactorial Intermittent - better now  Some IBS symptoms  Pt thinks worse since pelvic surgery- ? Poss adhesions Also hx of diverticulosis   Will begin daily fiber supplement  Ref to GI   Update if worse in meantime or if fever or blood in stool

## 2015-02-17 ENCOUNTER — Ambulatory Visit: Payer: Commercial Managed Care - HMO | Admitting: Family Medicine

## 2015-03-10 ENCOUNTER — Encounter: Payer: Self-pay | Admitting: Family Medicine

## 2015-03-10 ENCOUNTER — Telehealth: Payer: Self-pay | Admitting: Family Medicine

## 2015-03-10 NOTE — Telephone Encounter (Signed)
03/10/15-LVM for pt to call back so I can give her the GI appt info.  [facility] 03/18/15 @ 115pm  Also mailed letter to pt with all appt information on 03/10/15

## 2015-03-10 NOTE — Telephone Encounter (Signed)
Pt called back. Pt aware of appt

## 2015-03-21 DIAGNOSIS — R1030 Lower abdominal pain, unspecified: Secondary | ICD-10-CM | POA: Diagnosis not present

## 2015-03-21 DIAGNOSIS — R0609 Other forms of dyspnea: Secondary | ICD-10-CM | POA: Diagnosis not present

## 2015-03-25 DIAGNOSIS — N390 Urinary tract infection, site not specified: Secondary | ICD-10-CM | POA: Diagnosis not present

## 2015-04-03 DIAGNOSIS — R0602 Shortness of breath: Secondary | ICD-10-CM | POA: Diagnosis not present

## 2015-04-03 DIAGNOSIS — J45909 Unspecified asthma, uncomplicated: Secondary | ICD-10-CM | POA: Diagnosis not present

## 2015-04-03 DIAGNOSIS — I208 Other forms of angina pectoris: Secondary | ICD-10-CM | POA: Diagnosis not present

## 2015-04-03 DIAGNOSIS — K219 Gastro-esophageal reflux disease without esophagitis: Secondary | ICD-10-CM | POA: Diagnosis not present

## 2015-04-03 DIAGNOSIS — E669 Obesity, unspecified: Secondary | ICD-10-CM | POA: Diagnosis not present

## 2015-04-03 DIAGNOSIS — R011 Cardiac murmur, unspecified: Secondary | ICD-10-CM | POA: Diagnosis not present

## 2015-04-03 DIAGNOSIS — I209 Angina pectoris, unspecified: Secondary | ICD-10-CM | POA: Diagnosis not present

## 2015-04-03 DIAGNOSIS — E784 Other hyperlipidemia: Secondary | ICD-10-CM | POA: Diagnosis not present

## 2015-04-14 DIAGNOSIS — I208 Other forms of angina pectoris: Secondary | ICD-10-CM | POA: Diagnosis not present

## 2015-04-14 DIAGNOSIS — R0602 Shortness of breath: Secondary | ICD-10-CM | POA: Diagnosis not present

## 2015-04-17 DIAGNOSIS — Z961 Presence of intraocular lens: Secondary | ICD-10-CM | POA: Diagnosis not present

## 2015-04-24 DIAGNOSIS — J45909 Unspecified asthma, uncomplicated: Secondary | ICD-10-CM | POA: Diagnosis not present

## 2015-04-24 DIAGNOSIS — E669 Obesity, unspecified: Secondary | ICD-10-CM | POA: Diagnosis not present

## 2015-04-24 DIAGNOSIS — Z01818 Encounter for other preprocedural examination: Secondary | ICD-10-CM | POA: Diagnosis not present

## 2015-04-24 DIAGNOSIS — E784 Other hyperlipidemia: Secondary | ICD-10-CM | POA: Diagnosis not present

## 2015-04-30 ENCOUNTER — Encounter: Payer: Self-pay | Admitting: *Deleted

## 2015-05-01 ENCOUNTER — Ambulatory Visit
Admission: RE | Admit: 2015-05-01 | Discharge: 2015-05-01 | Disposition: A | Discharge status: Home / Self Care | Payer: Medicare PPO | Source: Ambulatory Visit | Attending: Unknown Physician Specialty | Admitting: Unknown Physician Specialty

## 2015-05-01 ENCOUNTER — Encounter
Admission: RE | Disposition: A | Discharge status: Home / Self Care | Payer: Self-pay | Source: Ambulatory Visit | Attending: Unknown Physician Specialty

## 2015-05-01 ENCOUNTER — Ambulatory Visit: Payer: Medicare PPO | Admitting: Certified Registered Nurse Anesthetist

## 2015-05-01 DIAGNOSIS — K640 First degree hemorrhoids: Secondary | ICD-10-CM | POA: Diagnosis not present

## 2015-05-01 DIAGNOSIS — K649 Unspecified hemorrhoids: Secondary | ICD-10-CM | POA: Diagnosis not present

## 2015-05-01 DIAGNOSIS — R1032 Left lower quadrant pain: Secondary | ICD-10-CM | POA: Diagnosis not present

## 2015-05-01 DIAGNOSIS — E785 Hyperlipidemia, unspecified: Secondary | ICD-10-CM | POA: Insufficient documentation

## 2015-05-01 DIAGNOSIS — Z79899 Other long term (current) drug therapy: Secondary | ICD-10-CM | POA: Insufficient documentation

## 2015-05-01 DIAGNOSIS — R1030 Lower abdominal pain, unspecified: Secondary | ICD-10-CM | POA: Diagnosis present

## 2015-05-01 DIAGNOSIS — Z6834 Body mass index (BMI) 34.0-34.9, adult: Secondary | ICD-10-CM | POA: Insufficient documentation

## 2015-05-01 DIAGNOSIS — J45909 Unspecified asthma, uncomplicated: Secondary | ICD-10-CM | POA: Diagnosis not present

## 2015-05-01 DIAGNOSIS — K219 Gastro-esophageal reflux disease without esophagitis: Secondary | ICD-10-CM | POA: Insufficient documentation

## 2015-05-01 DIAGNOSIS — M8580 Other specified disorders of bone density and structure, unspecified site: Secondary | ICD-10-CM | POA: Diagnosis not present

## 2015-05-01 DIAGNOSIS — K5790 Diverticulosis of intestine, part unspecified, without perforation or abscess without bleeding: Secondary | ICD-10-CM | POA: Diagnosis not present

## 2015-05-01 DIAGNOSIS — E669 Obesity, unspecified: Secondary | ICD-10-CM | POA: Diagnosis not present

## 2015-05-01 DIAGNOSIS — M069 Rheumatoid arthritis, unspecified: Secondary | ICD-10-CM | POA: Diagnosis not present

## 2015-05-01 DIAGNOSIS — Z7982 Long term (current) use of aspirin: Secondary | ICD-10-CM | POA: Diagnosis not present

## 2015-05-01 DIAGNOSIS — K5730 Diverticulosis of large intestine without perforation or abscess without bleeding: Secondary | ICD-10-CM | POA: Insufficient documentation

## 2015-05-01 DIAGNOSIS — R109 Unspecified abdominal pain: Secondary | ICD-10-CM | POA: Diagnosis not present

## 2015-05-01 HISTORY — DX: Reserved for inherently not codable concepts without codable children: IMO0001

## 2015-05-01 HISTORY — PX: COLONOSCOPY WITH PROPOFOL: SHX5780

## 2015-05-01 HISTORY — DX: Angina pectoris, unspecified: I20.9

## 2015-05-01 SURGERY — COLONOSCOPY WITH PROPOFOL
Anesthesia: General

## 2015-05-01 MED ORDER — PROPOFOL 10 MG/ML IV BOLUS
INTRAVENOUS | Status: DC | PRN
  Administered 2015-05-01: 50 mg via INTRAVENOUS

## 2015-05-01 MED ORDER — SODIUM CHLORIDE 0.9 % IV SOLN: INTRAVENOUS | Status: DC

## 2015-05-01 MED ORDER — LIDOCAINE HCL (CARDIAC) 20 MG/ML IV SOLN
INTRAVENOUS | Status: DC | PRN
  Administered 2015-05-01: 40 mg via INTRAVENOUS

## 2015-05-01 MED ORDER — PROPOFOL 500 MG/50ML IV EMUL
INTRAVENOUS | Status: DC | PRN
  Administered 2015-05-01: 120 ug/kg/min via INTRAVENOUS

## 2015-05-01 MED ORDER — SODIUM CHLORIDE 0.9 % IV SOLN
INTRAVENOUS | Status: DC
  Administered 2015-05-01: 10:00:00 via INTRAVENOUS

## 2015-05-01 NOTE — Transfer of Care (Addendum)
Immediate Anesthesia Transfer of Care Note  Patient: Cheryl Joseph  Procedure(s) Performed: Procedure(s): COLONOSCOPY WITH PROPOFOL (N/A)  Patient Location: PACU  Anesthesia Type:General  Level of Consciousness: sedated  Airway & Oxygen Therapy: Patient Spontanous Breathing  Post-op Assessment: Report given to RN and Post -op Vital signs reviewed and stable  Post vital signs: Reviewed and stable  Last Vitals:  Filed Vitals:   05/01/15 1025  BP:   Pulse:   Temp: 36 C  Resp:   BP 85/56 Pulse 58 Resp 14  Complications: No apparent anesthesia complications

## 2015-05-01 NOTE — Anesthesia Preprocedure Evaluation (Signed)
Anesthesia Evaluation  Patient identified by MRN, date of birth, ID band Patient awake    Reviewed: Allergy & Precautions, NPO status , Patient's Chart, lab work & pertinent test results  History of Anesthesia Complications Negative for: history of anesthetic complications  Airway Mallampati: II       Dental  (+) Upper Dentures   Pulmonary asthma ,           Cardiovascular      Neuro/Psych negative neurological ROS     GI/Hepatic   Endo/Other    Renal/GU      Musculoskeletal  (+) Arthritis , Osteoarthritis,    Abdominal   Peds  Hematology   Anesthesia Other Findings   Reproductive/Obstetrics                             Anesthesia Physical Anesthesia Plan  ASA: III  Anesthesia Plan: General   Post-op Pain Management:    Induction:   Airway Management Planned: Nasal Cannula  Additional Equipment:   Intra-op Plan:   Post-operative Plan:   Informed Consent: I have reviewed the patients History and Physical, chart, labs and discussed the procedure including the risks, benefits and alternatives for the proposed anesthesia with the patient or authorized representative who has indicated his/her understanding and acceptance.     Plan Discussed with:   Anesthesia Plan Comments:         Anesthesia Quick Evaluation

## 2015-05-01 NOTE — H&P (Signed)
Primary Care Physician:  Loura Pardon, MD Primary Gastroenterologist:  Dr. Vira Agar  Pre-Procedure History & Physical: HPI:  Cheryl Joseph is a 74 y.o. female is here for an colonoscopy.   Past Medical History  Diagnosis Date  . HPV in female 1996    HPV with colposcopy (all neg paps since)  . Hyperlipidemia   . Asthma   . Osteopenia   . Obesity   . Arthritis     RA hands(seronegative)  . Back pain   . Shoulder pain   . Anginal pain ([place])   . GERD (gastroesophageal reflux disease)   . Shortness of breath dyspnea     Past Surgical History  Procedure Laterality Date  . Tonsillectomy    . Abdominal hysterectomy    . Eye surgery      Prior to Admission medications   Medication Sig Start Date End Date Taking? Authorizing Provider  albuterol (PROVENTIL HFA;VENTOLIN HFA) 108 (90 BASE) MCG/ACT inhaler Inhale 2 puffs into the lungs every 4 (four) hours as needed for wheezing (and 2 puffs before exercise or exposure to cold air). 08/19/14   Abner Greenspan, MD  aspirin-sod bicarb-citric acid (ALKA-SELTZER) 325 MG TBEF tablet Take 325 mg by mouth every 6 (six) hours as needed.    Historical Provider, MD  b complex vitamins tablet Take 1 tablet by mouth daily.    Historical Provider, MD  benzonatate (TESSALON) 200 MG capsule Take 1 capsule (200 mg total) by mouth 3 (three) times daily as needed for cough (swallow whole, do not bite pill). 10/14/14   Abner Greenspan, MD  Calcium Carbonate-Vitamin D (CALCIUM-VITAMIN D3 PO) Take 1 capsule by mouth daily.    Historical Provider, MD  glucosamine-chondroitin 500-400 MG tablet Take 1 tablet by mouth daily.    Historical Provider, MD  guaiFENesin (MUCINEX) 600 MG 12 hr tablet Take by mouth 2 (two) times daily.    Historical Provider, MD  Hyaluronic Acid-Vitamin C (HYALURONIC ACID PO) Take 1 tablet by mouth daily.    Historical Provider, MD  ketoconazole (NIZORAL) 2 % cream Apply 1 application topically daily. To affected area 06/12/14   Abner Greenspan, MD  montelukast (SINGULAIR) 10 MG tablet Take 1 tablet (10 mg total) by mouth at bedtime. 08/19/14   Abner Greenspan, MD  Multiple Vitamin (MULTIVITAMIN) capsule Take 1 capsule by mouth daily.    Historical Provider, MD  NON FORMULARY Take 1 capsule by mouth 2 (two) times daily. [place]    Historical Provider, MD  Omega-3 Fatty Acids (FISH OIL PO) Take 2 capsules by mouth daily.      Historical Provider, MD  oxybutynin (OXYTROL) 3.9 MG/24HR Place 1 patch onto the skin every 3 (three) days. 06/12/14   Abner Greenspan, MD  simvastatin (ZOCOR) 40 MG tablet Take 1 tablet (40 mg total) by mouth at bedtime. 08/19/14   Abner Greenspan, MD  Ubiquinol 100 MG CAPS Take 1 capsule by mouth 3 (three) times a week.    Historical Provider, MD    Allergies as of 03/24/2015 - Review Complete 02/14/2015  Allergen Reaction Noted  . Ezetimibe-simvastatin  01/03/2007  . Tolterodine tartrate  01/03/2007  . Vesicare [solifenacin succinate]  06/22/2011    Family History  Problem Relation Age of Onset  . Diabetes Mother   . Hyperlipidemia Mother   . Heart disease Mother   . Hypertension Mother   . Obesity Mother   . Kyphosis Mother   . Heart disease Father   .  Hypertension Father   . Cancer Father     lung Cancer smoker  . Kyphosis Sister   . Diabetes Brother     Social History   Social History  . Marital Status: Married    Spouse Name: N/A  . Number of Children: N/A  . Years of Education: N/A   Occupational History  . Not on file.   Social History Main Topics  . Smoking status: Never Smoker   . Smokeless tobacco: Not on file  . Alcohol Use: No  . Drug Use: No  . Sexual Activity: Not on file   Other Topics Concern  . Not on file   Social History Narrative    Review of Systems: See HPI, otherwise negative ROS  Physical Exam: BP 153/87 mmHg  Pulse 67  Temp(Src) 98.5 F (36.9 C) (Oral)  Resp 19  Ht 5\' 4"  (1.626 m)  Wt 90.719 kg (200 lb)  BMI 34.31 kg/m2  SpO2 98% General:    Alert,  pleasant and cooperative in NAD Head:  Normocephalic and atraumatic. Neck:  Supple; no masses or thyromegaly. Lungs:  Clear throughout to auscultation.    Heart:  Regular rate and rhythm. Abdomen:  Soft, nontender and nondistended. Normal bowel sounds, without guarding, and without rebound.   Neurologic:  Alert and  oriented x4;  grossly normal neurologically.  Impression/Plan: Cheryl Joseph is here for an colonoscopy to be performed for lower abdominal pain  Risks, benefits, limitations, and alternatives regarding  colonoscopy have been reviewed with the patient.  Questions have been answered.  All parties agreeable.   Gaylyn Cheers, MD  05/01/2015, 9:57 AM

## 2015-05-01 NOTE — Anesthesia Postprocedure Evaluation (Signed)
  Anesthesia Post-op Note  Patient: Cheryl Joseph  Procedure(s) Performed: Procedure(s): COLONOSCOPY WITH PROPOFOL (N/A)  Anesthesia type:General  Patient location: PACU  Post pain: Pain level controlled  Post assessment: Post-op Vital signs reviewed, Patient's Cardiovascular Status Stable, Respiratory Function Stable, Patent Airway and No signs of Nausea or vomiting  Post vital signs: Reviewed and stable  Last Vitals:  Filed Vitals:   05/01/15 1108  BP: 154/64  Pulse: 51  Temp:   Resp: 20    Level of consciousness: awake, alert  and patient cooperative  Complications: No apparent anesthesia complications

## 2015-05-01 NOTE — Anesthesia Procedure Notes (Signed)
Performed by: Demetrius Charity Pre-anesthesia Checklist: Patient identified, Emergency Drugs available, Suction available, Timeout performed and Patient being monitored Patient Re-evaluated:Patient Re-evaluated prior to inductionOxygen Delivery Method: Nasal cannula

## 2015-05-01 NOTE — Op Note (Signed)
[facility] Patient Name: Cheryl Joseph Procedure Date: 05/01/2015 9:49 AM MRN: [id number] Account #: [id number] Date of Birth: [birth year] Admit Type: Outpatient Age: 74 Room: [facility] ENDO ROOM 1 Gender: Female Note Status: Finalized Procedure:         Colonoscopy Indications:       Abdominal pain in the left lower quadrant Providers:         Manya Silvas, MD Referring MD:      Wynelle Fanny. Tower, MD (Referring MD) Medicines:         Propofol per Anesthesia Complications:     No immediate complications. Procedure:         Pre-Anesthesia Assessment:                    - After reviewing the risks and benefits, the patient was                     deemed in satisfactory condition to undergo the procedure.                    After obtaining informed consent, the colonoscope was                     passed under direct vision. Throughout the procedure, the                     patient's blood pressure, pulse, and oxygen saturations                     were monitored continuously. The Colonoscope was                     introduced through the anus and advanced to the the cecum,                     identified by appendiceal orifice and ileocecal valve. The                     colonoscopy was performed without difficulty. The patient                     tolerated the procedure well. The quality of the bowel                     preparation was good. Findings:      Multiple small-medium-mouthed diverticula were found in the sigmoid       colon, in the descending colon and in the transverse colon.      Internal hemorrhoids were found during endoscopy. The hemorrhoids were       medium-sized and Grade I (internal hemorrhoids that do not prolapse).      The exam was otherwise without abnormality. Impression:        - Diverticulosis in the sigmoid colon, in the descending                     colon and in the transverse colon.                    - Internal  hemorrhoids.                    - The examination was otherwise normal.                    -  No specimens collected. Recommendation:    - The findings and recommendations were discussed with the                     patient's family. Manya Silvas, MD 05/01/2015 10:22:19 AM This report has been signed electronically. Number of Addenda: 0 Note Initiated On: 05/01/2015 9:49 AM Scope Withdrawal Time: 0 hours 6 minutes 1 second  Total Procedure Duration: 0 hours 12 minutes 32 seconds       [facility]

## 2015-05-05 ENCOUNTER — Encounter: Payer: Self-pay | Admitting: Unknown Physician Specialty

## 2015-05-09 ENCOUNTER — Ambulatory Visit (INDEPENDENT_AMBULATORY_CARE_PROVIDER_SITE_OTHER): Payer: Medicare PPO

## 2015-05-09 DIAGNOSIS — Z23 Encounter for immunization: Secondary | ICD-10-CM | POA: Diagnosis not present

## 2015-05-22 DIAGNOSIS — R1032 Left lower quadrant pain: Secondary | ICD-10-CM | POA: Diagnosis not present

## 2015-05-22 DIAGNOSIS — R1031 Right lower quadrant pain: Secondary | ICD-10-CM | POA: Diagnosis not present

## 2015-05-22 DIAGNOSIS — N8184 Pelvic muscle wasting: Secondary | ICD-10-CM | POA: Diagnosis not present

## 2015-05-28 ENCOUNTER — Ambulatory Visit: Payer: Medicare PPO | Attending: Nurse Practitioner | Admitting: Physical Therapy

## 2015-05-28 ENCOUNTER — Encounter: Payer: Self-pay | Admitting: Physical Therapy

## 2015-05-28 DIAGNOSIS — M629 Disorder of muscle, unspecified: Secondary | ICD-10-CM | POA: Diagnosis not present

## 2015-05-28 DIAGNOSIS — R279 Unspecified lack of coordination: Secondary | ICD-10-CM | POA: Insufficient documentation

## 2015-05-28 DIAGNOSIS — N8189 Other female genital prolapse: Secondary | ICD-10-CM | POA: Diagnosis not present

## 2015-05-28 NOTE — Patient Instructions (Addendum)
To manage overactive bladder: balance out ratio This week: Decrease bladder irritanst from 4 servings to 3 servings( by decreasing decaf tea from 2 to 1 )  and increase water to 3 glasses / day Next week: 3 servings of bladder irritant (tea, juice, soda): increase water to 4 glasses/ day Following week: 2 servings of bladder irritant and increase 5 servings of water   To decrease night time voiding:  Def tea in the afternoon instead of dinner Juice at dinner instead of pre-bedtime.  Sip of water within the 2 -3 hours prior bed time.    Drink room temperature water.  _____________  PELVIC FLOOR / KEGEL EXERCISES   Pelvic floor/ Kegel exercises are used to strengthen the muscles in the base of your pelvis that are responsible for supporting your pelvic organs and preventing urine/feces leakage. Based on your therapist's recommendations, they can be performed while standing, sitting, or lying down. Imagine pelvic floor area as a diamond with pelvic landmarks: top =pubic bone, bottom tip=tailbone, sides=sitting bones (ischial tuberosities).    Make yourself aware of this muscle group by using these cues while coordinating your breath:  Inhale, feel pelvic floor diamond area lower like hammock towards your feet and ribcage/belly expanding. Pause. Let the exhale naturally and feel your belly sink, abdominal muscles hugging in around you and you may notice the pelvic diamond draws upward towards your head forming a umbrella shape. Give a squeeze during the exhalation like you are stopping the flow of urine. If you are squeezing the buttock muscles, try to give 50% less effort.   Common Errors:  Breath holding: If you are holding your breath, you may be bearing down against your bladder instead of pulling it up. If you belly bulges up while you are squeezing, you are holding your breath. Be sure to breathe gently in and out while exercising. Counting out loud may help you avoid holding your  breath.  Accessory muscle use: You should not see or feel other muscle movement when performing pelvic floor exercises. When done properly, no one can tell that you are performing the exercises. Keep the buttocks, belly and inner thighs relaxed.  Overdoing it: Your muscles can fatigue and stop working for you if you over-exercise. You may actually leak more or feel soreness at the lower abdomen or rectum.  YOUR HOME EXERCISE PROGRAM  LONG HOLDS: Position: on back w/ pillow under hips   Inhale and then exhale. Then squeeze the muscle and count aloud for 10 seconds. Rest with three long breaths. (Be sure to let belly sink in with exhales and not push outward)  Perform 2 repetitions, 3-5 times/day  SHORT HOLDS: Position: on back w. Pillow under hips   Inhale and then exhale. Then squeeze the muscle.  (Be sure to let belly sink in with exhales and not push outward)  Perform 3 repetitions, 5  Times/day                      DECREASE DOWNWARD PRESSURE ON  YOUR PELVIC FLOOR, ABDOMINAL, LOW BACK MUSCLES       PRESERVE YOUR PELVIC HEALTH LONG-TERM   ** SQUEEZE pelvic floor BEFORE YOUR SNEEZE, COUGH, LAUGH   ** EXHALE BEFORE YOU RISE AGAINST GRAVITY (lifting, sit to stand, from squat to stand)   ** LOG ROLL OUT OF BED INSTEAD OF CRUNCH/SIT-UP      _____________________________  Preserve the function of your pelvic floor, abdomen, and back.              Avoid decreased straining of abdominal/pelvic floor muscles with less              slouching,  holding your breath with lifting/bowel movements)                                                     FUNCTIONAL POSTURES        

## 2015-05-29 NOTE — Therapy (Signed)
[place] MAIN [facility] SERVICES [address] [place], [place], [address] Phone: [phone]   Fax:  [phone]  Physical Therapy Evaluation  Patient Details  Name: Cheryl Joseph MRN: [id number] Date of Birth: Mar 01, 1941 Referring Provider: Gershon Mussel , NP  Encounter Date: 05/28/2015      PT End of Session - 05/29/15 1041    Visit Number 1   Number of Visits 12   Date for PT Re-Evaluation 08/06/15   Authorization Type G-code on 10th visit   PT Start Time 1000   PT Stop Time 1110   PT Time Calculation (min) 70 min   Activity Tolerance Patient tolerated treatment well;No increased pain   Behavior During Therapy [facility] for tasks assessed/performed      Past Medical History  Diagnosis Date  . HPV in female 1996    HPV with colposcopy (all neg paps since)  . Hyperlipidemia   . Asthma   . Osteopenia   . Obesity   . Arthritis     RA hands(seronegative)  . Back pain   . Shoulder pain   . Anginal pain ([place])   . GERD (gastroesophageal reflux disease)   . Shortness of breath dyspnea     Past Surgical History  Procedure Laterality Date  . Tonsillectomy    . Eye surgery    . Colonoscopy with propofol N/A 05/01/2015    Procedure: COLONOSCOPY WITH PROPOFOL;  Surgeon: Manya Silvas, MD;  Location: [facility] ENDOSCOPY;  Service: Endoscopy;  Laterality: N/A;  . Vaginal hysterectomy  2015    with pelvic organ prolapse surgery   . Pelvic organ prolapse surgery   2015    There were no vitals filed for this visit.  Visit Diagnosis:  Fascial defect - Plan: PT plan of care cert/re-cert, CANCELED: PT plan of care cert/re-cert  Pelvic floor weakness - Plan: PT plan of care cert/re-cert, CANCELED: PT plan of care cert/re-cert  Lack of coordination - Plan: PT plan of care cert/re-cert, CANCELED: PT plan of care cert/re-cert      Subjective Assessment - 05/28/15 1008    Subjective Pt reported bilateral LQ abdominal/ perineal pain  (7/10) described as "gas/bloating sensations" after sexual intercourse that lasts 12 hours. Positions do not impact pain. This complaint started after 2015 prolapse surgery. Easing factors include Tylenol (50% improvement). Pt  reports frequency of intercourse as 2x/ week and pt has not modified frequency and deal with pain. Bowel function: Probiotics have improved her bowel function and management of diverticulitis. Daily bowel movements [place] Stool Type 3-4. Diarrhea 1x/ week.  Urinary Function: pt had overactive bladder for 10 years and underwent prolapse surgery as an attempt to correct. Pt had not tried PT prior to surgery. Pt reports no improvement with urinary Sx post-surgery and continues to wear the Oxytrol patch which "does help".  Currently pt has to urinate 1x every 2-3 hrs during the day, nocturia ranges 1-4x /night. Daily fluid intake : decaf tea 2 cups, 7 oz diet coke, 1/2 cup of juice, 2 cups of water.          Pertinent History Hx of asthma Dx 2001 : induced with cold weather and walking in elevated terrain, pt started taking probiotics  6 weeks ago which has helped with diarrhea associated diverticulitis, 3 vaginal deliveries, enjoy hiking with family,    Patient Stated Goals 1) sleep through the night 2) sexual intercources without pain    Currently in Pain? No/denies   Pain  Score 0-No pain            [facility] PT Assessment - 05/29/15 1007    Assessment   Medical Diagnosis pelvic pain    Precautions   Precautions None   Restrictions   Weight Bearing Restrictions No   Balance Screen   Has the patient fallen in the past 6 months No   Prior Function   Level of Independence Independent   Observation/Other Assessments   Other Surveys  --  93% FSFI, 62.5% UDI-6    Coordination   Gross Motor Movements are Fluid and Coordinated --  abdominal and pelvic floor straining with cue for BM   Single Leg Stance   Comments 3 sec with LOB bilaterally   ROM / Strength   AROM / PROM /  Strength --  limited cervical/upper thoracic segmental movement   Palpation   Palpation comment mm tensions around thoracic segments   Bed Mobility   Bed Mobility --  half crunch out of bed, able to log rolling with moderate cu                 Pelvic Floor Special Questions - 05/29/15 1033    Prolapse other bladder slightly introitus w/ cue for coughing, post-Tx without pillow under hips, bladder in normal position with cue to cough more circumferential contraction achieved post-Tx   Pelvic Floor Internal Exam pt verbally consented and had no contraindications    Exam Type Vaginal   Strength weak squeeze, no lift  pre-Tx: 2/5, post-Tx: 3/5    Biofeedback visual/ tactile cues for deep core coordination with pelvic floor and breathing   decreased endurance w/ contraction on inhalation          [facility] Adult PT Treatment/Exercise - 05/29/15 1007    Self-Care   Other Self-Care Comments  education on physiology during sex and other pelvic floor function   Therapeutic Activites    Therapeutic Activities --  ways to not bear down on pelvic floor w/ ADLs   Neuro Re-ed    Neuro Re-ed Details  deep core coordination, log rolling, toileting posture    Manual Therapy   Myofascial Release jostling and fascial release over suprapubic region   anterior pelvic floor mm                  PT Education - 05/29/15 1041    Education provided Yes   Education Details HEP, POC, goals, anatomy and physiology   Person(s) Educated Patient   Methods Explanation;Demonstration;Tactile cues;Verbal cues;Handout   Comprehension Verbalized understanding;Returned demonstration             PT Long Term Goals - 05/29/15 1050    PT LONG TERM GOAL #1   Title Pt will increase her FSFI score from 92% to > 95% in order to improve her QOL.   Time 12   Period Weeks   Status New   PT LONG TERM GOAL #2   Title Pt will decrease her UDI-6 from 62.5% to < 50% in order to have uninterrupted  sleep in the night.    Time 12   Period Weeks   Status New   PT LONG TERM GOAL #3   Title Pt will demo pelvic floor strength 3/7/7/7 in order to improve urogenital function and decrease use of pads.    Time 12   Period Weeks   Status New   PT LONG TERM GOAL #4   Title Pt will report no pain after sexual intercourse  across 2x occasions in order to return ADLs.    PT LONG TERM GOAL #5   Time 12   Period Weeks   Status New               Plan - 05/29/15 1042    Clinical Impression Statement Pt is a 74 yo female whose S & Sx consist of abdominal/pubic pain post-coitus, nocturia, poor knowledge on bladder irritant to water ratio,  abnormal positioning of bladder, poor posture, spinal deviations, increased spinal mm tensions, weak pelvic floor muscles, poor deep core coordination and strength, and poor balance. Abdominal / pubic pain was not reproduced through today's exam. Suspect pt's poor fascial tensigrity, deep core strength/coordination, and abnormal positioning of bladder maybe contributing factors to her abdominal/pubic pain that occurs after sexual intercourse. Suspect pt's poor knowledge on bladder irritants and pelvic floor weakness to contribute to her nocturia Sx. These deficits negatively impact her sleep and her QOL with her husband.    Pt will benefit from skilled therapeutic intervention in order to improve on the following deficits Decreased balance;Hypomobility;Pain;Decreased activity tolerance;Decreased coordination;Decreased range of motion;Impaired tone;Increased fascial restrictions;Decreased endurance;Decreased safety awareness;Decreased mobility;Decreased strength;Postural dysfunction;Improper body mechanics   Rehab Potential Good   Clinical Impairments Affecting Rehab Potential poor improvement of urinary Sx post-vaginal prolapse surgery without PT   PT Treatment/Interventions ADLs/Self Care Home Management;Biofeedback;Electrical Stimulation;Cryotherapy;Aquatic  Therapy;Moist Heat;Therapeutic exercise;Therapeutic activities;Functional mobility training;Stair training;Gait training;Traction;Balance training;Neuromuscular re-education;Patient/family education;Manual techniques;Taping;Energy conservation;Dry needling;Passive range of motion   Consulted and Agree with Plan of Care Patient         Problem List Patient Active Problem List   Diagnosis Date Noted  . Frequent loose stools 02/14/2015  . Diverticulosis of colon without hemorrhage 02/14/2015  . Acute bronchitis 10/14/2014  . Colon cancer screening 08/19/2014  . Diverticulitis of colon without hemorrhage 05/25/2014  . Abdominal pain, left lower quadrant 03/18/2014  . Other malaise and fatigue 02/25/2014  . Labile blood pressure 02/25/2014  . Encounter for Medicare annual wellness exam 07/16/2013  . Other screening mammogram 06/22/2011  . Intertrigo 06/22/2011  . KNEE PAIN, RIGHT 06/03/2010  . Hyperglycemia 05/14/2009  . RHEUMATOID ARTHRITIS 05/13/2008  . ELEVATED BP W/O HYPERTENSION 05/13/2008  . PAIN IN JOINT, HAND 06/13/2007  . Osteopenia 03/07/2007  . Hyperlipidemia 01/03/2007  . Obesity 01/03/2007  . ALLERGIC RHINITIS, SEASONAL 01/03/2007  . ASTHMA 01/03/2007  . OVERACTIVE BLADDER 01/03/2007    Jerl Mina  ,PT, DPT, E-RYT  05/29/2015, 11:07 AM  [place] MAIN [facility] SERVICES [address] [place], [place], [address] Phone: [phone]   Fax:  [phone]  Name: Cheryl Joseph MRN: [id number] Date of Birth: September 02, 1940

## 2015-06-06 ENCOUNTER — Ambulatory Visit: Payer: Medicare PPO | Admitting: Physical Therapy

## 2015-06-06 DIAGNOSIS — R279 Unspecified lack of coordination: Secondary | ICD-10-CM | POA: Diagnosis not present

## 2015-06-06 DIAGNOSIS — M629 Disorder of muscle, unspecified: Secondary | ICD-10-CM | POA: Diagnosis not present

## 2015-06-06 DIAGNOSIS — N8189 Other female genital prolapse: Secondary | ICD-10-CM | POA: Diagnosis not present

## 2015-06-06 NOTE — Patient Instructions (Signed)
  You are now ready to begin training the deep core muscles system: diaphragm, transverse abdominis, pelvic floor . These muscles must work together as a Therapist, occupational.      EXHALE AS YOU RISE FROM THE CHAIR, from a squat  GARDENING ; weeding the cypress weed this weekend: use squat technique or lunge           PELVIC FLOOR / KEGEL EXERCISES   Pelvic floor/ Kegel exercises are used to strengthen the muscles in the base of your pelvis that are responsible for supporting your pelvic organs and preventing urine/feces leakage. Based on your therapist's recommendations, they can be performed while standing, sitting, or lying down.  Make yourself aware of this muscle group by using these cues:  Imagine you are in a crowded room and you feel the need to pass gas. Your response is to pull up and in at the rectum.  Close the rectum. Pull the muscles up inside your body,feeling your scrotum lifting as well . Feel the pelvic floor muscles lift as if you were walking into a cold lake.  Place your hand on top of your pubic bone. Tighten and draw in the muscles around the anal muscles without squeezing the buttock muscles.  Common Errors:  Breath holding: If you are holding your breath, you may be bearing down against your bladder instead of pulling it up. If you belly bulges up while you are squeezing, you are holding your breath. Be sure to breathe gently in and out while exercising. Counting out loud may help you avoid holding your breath.  Accessory muscle use: You should not see or feel other muscle movement when performing pelvic floor exercises. When done properly, no one can tell that you are performing the exercises. Keep the buttocks, belly and inner thighs relaxed.  Overdoing it: Your muscles can fatigue and stop working for you if you over-exercise. You may actually leak more or feel soreness at the lower abdomen or rectum.  YOUR HOME EXERCISE PROGRAM   SHORT HOLDS: Position: on back,  sitting   Inhale and then exhale. Then squeeze the muscle. Then let go, inhale. repeat  (Be sure to let belly sink in with exhales and not push outward)  Perform 8 repetitions, 3  Times/day  **ALSO SQUEEZE BEFORE YOUR SNEEZE, COUGH, LAUGH to decrease downward pressure   **ALSO EXHALE BEFORE YOU RISE AGAINST GRAVITY (lifting, sit to stand, from squat to stand)

## 2015-06-06 NOTE — Therapy (Deleted)
[place] MAIN [facility] SERVICES [address] [place], [place], [address] Phone: [phone]   Fax:  [phone]  Physical Therapy Treatment  Patient Details  Name: Cheryl Joseph MRN: [id number] Date of Birth: [birth year] Referring Provider: Gershon Mussel , NP  Encounter Date: 06/06/2015    Past Medical History  Diagnosis Date  . HPV in female 1996    HPV with colposcopy (all neg paps since)  . Hyperlipidemia   . Asthma   . Osteopenia   . Obesity   . Arthritis     RA hands(seronegative)  . Back pain   . Shoulder pain   . Anginal pain ([place])   . GERD (gastroesophageal reflux disease)   . Shortness of breath dyspnea     Past Surgical History  Procedure Laterality Date  . Tonsillectomy    . Eye surgery    . Colonoscopy with propofol N/A 05/01/2015    Procedure: COLONOSCOPY WITH PROPOFOL;  Surgeon: Manya Silvas, MD;  Location: [facility] ENDOSCOPY;  Service: Endoscopy;  Laterality: N/A;  . Vaginal hysterectomy  2015    with pelvic organ prolapse surgery   . Pelvic organ prolapse surgery   2015    There were no vitals filed for this visit.  Visit Diagnosis:  Fascial defect  Pelvic floor weakness  Lack of coordination      Subjective Assessment - 06/06/15 1038    Subjective Pt reported no pain with sexual intercourse and practiced log rolling, toileting posture. Pt is also drinking more water.    Pertinent History Hx of asthma Dx 2001 : induced with cold weather and walking in elevated terrain, pt started taking probiotics  6 weeks ago which has helped with diarrhea associated diverticulitis, 3 vaginal deliveries, enjoy hiking with family,    Patient Stated Goals 1) sleep through the night 2) sexual intercources without pain             [facility] PT Assessment - 06/06/15 1030    Squat   Comments narrow BOS, able to learn proper alignment foot, knee. No pain with new technique   Lunges   Comments narrow BOS, knee more  forward ankles, able to correct with proper alignment    Transfers   Five time sit to stand comments  32:23   Number of Reps 1 set   Transfer Cueing nose over toes, inhale, then exhale  on rise, wide feet                     [facility] Adult PT Treatment/Exercise - 06/06/15 1031    Therapeutic Activites    Therapeutic Activities --  lunge 5reps each side, squat 5reps,                      PT Long Term Goals - 05/29/15 1050    PT LONG TERM GOAL #1   Title Pt will increase her FSFI score from 92% to > 95% in order to improve her QOL.   Time 12   Period Weeks   Status New   PT LONG TERM GOAL #2   Title Pt will decrease her UDI-6 from 62.5% to < 50% in order to have uninterrupted sleep in the night.    Time 12   Period Weeks   Status New   PT LONG TERM GOAL #3   Title Pt will demo pelvic floor strength 3/7/7/7 in order to improve urogenital function and decrease use of pads.  Time 12   Period Weeks   Status New   PT LONG TERM GOAL #4   Title Pt will report no pain after sexual intercourse across 2x occasions in order to return ADLs.    PT LONG TERM GOAL #5   Time 12   Period Weeks   Status New               Problem List Patient Active Problem List   Diagnosis Date Noted  . Frequent loose stools 02/14/2015  . Diverticulosis of colon without hemorrhage 02/14/2015  . Acute bronchitis 10/14/2014  . Colon cancer screening 08/19/2014  . Diverticulitis of colon without hemorrhage 05/25/2014  . Abdominal pain, left lower quadrant 03/18/2014  . Other malaise and fatigue 02/25/2014  . Labile blood pressure 02/25/2014  . Encounter for Medicare annual wellness exam 07/16/2013  . Other screening mammogram 06/22/2011  . Intertrigo 06/22/2011  . KNEE PAIN, RIGHT 06/03/2010  . Hyperglycemia 05/14/2009  . RHEUMATOID ARTHRITIS 05/13/2008  . ELEVATED BP W/O HYPERTENSION 05/13/2008  . PAIN IN JOINT, HAND 06/13/2007  . Osteopenia 03/07/2007  .  Hyperlipidemia 01/03/2007  . Obesity 01/03/2007  . ALLERGIC RHINITIS, SEASONAL 01/03/2007  . ASTHMA 01/03/2007  . OVERACTIVE BLADDER 01/03/2007    Jerl Mina ,PT, DPT, E-RYT  06/06/2015, 5:55 PM  [place] MAIN [facility] SERVICES [address] [place], [place], [address] Phone: [phone]   Fax:  [phone]  Name: Cheryl Joseph MRN: [id number] Date of Birth: [birth year]

## 2015-06-09 NOTE — Therapy (Addendum)
[place] MAIN [facility] SERVICES [address] [place], [place], [address] Phone: [phone]   Fax:  [phone]  Physical Therapy Treatment  Patient Details  Name: Cheryl Joseph MRN: [id number] Date of Birth: 1941/05/22 Referring Provider: Gershon Mussel , NP  Encounter Date: 06/06/2015      PT End of Session - 06/09/15 0714    Visit Number 2   Number of Visits 12   Date for PT Re-Evaluation 08/06/15   Authorization Type G-code on 10th visit   PT Start Time 1000   PT Stop Time 1100   PT Time Calculation (min) 60 min   Activity Tolerance Patient tolerated treatment well;No increased pain   Behavior During Therapy [facility] for tasks assessed/performed      Past Medical History  Diagnosis Date  . HPV in female 1996    HPV with colposcopy (all neg paps since)  . Hyperlipidemia   . Asthma   . Osteopenia   . Obesity   . Arthritis     RA hands(seronegative)  . Back pain   . Shoulder pain   . Anginal pain ([place])   . GERD (gastroesophageal reflux disease)   . Shortness of breath dyspnea     Past Surgical History  Procedure Laterality Date  . Tonsillectomy    . Eye surgery    . Colonoscopy with propofol N/A 05/01/2015    Procedure: COLONOSCOPY WITH PROPOFOL;  Surgeon: Manya Silvas, MD;  Location: [facility] ENDOSCOPY;  Service: Endoscopy;  Laterality: N/A;  . Vaginal hysterectomy  2015    with pelvic organ prolapse surgery   . Pelvic organ prolapse surgery   2015    There were no vitals filed for this visit.  Visit Diagnosis:  Fascial defect  Pelvic floor weakness  Lack of coordination      Subjective Assessment - 06/09/15 0719    Subjective Pt reported no pain with sexual intercourse and practiced log rolling, toileting posture. Pt is also drinking more water.    Pertinent History Hx of asthma Dx 2001 : induced with cold weather and walking in elevated terrain, pt started taking probiotics  6 weeks ago which has helped  with diarrhea associated diverticulitis, 3 vaginal deliveries, enjoy hiking with family,    Patient Stated Goals 1) sleep through the night 2) sexual intercources without pain             [facility] PT Assessment - 06/09/15 0709    Squat   Comments narrow BOS, able to learn proper alignment foot, knee. No pain with new technique   Lunges   Comments narrow BOS, knee more forward ankles, able to correct with proper alignment    Posture/Postural Control   Posture Comments genu valgus with narrow BOS,  decreased knee pain with cue for wider stance (hip width)    Bed Mobility   Bed Mobility --  required cuing for log rolling 3 reps   Transfers   Five time sit to stand comments  32:23                  Pelvic Floor Special Questions - 06/09/15 0710    Pelvic Floor Internal Exam pt verbally consented and had no contraindications    Exam Type Vaginal   Strength fair squeeze, definite lift  pillow under hips required    Strength # of reps 8   Strength # of seconds 1   Biofeedback decreased cuing for coordination  cues for relaxation of  mm           [facility] Adult PT Treatment/Exercise - 06/09/15 0709    Therapeutic Activites    Therapeutic Activities Other Therapeutic Activities   Other Therapeutic Activities gardening body mechanics   no pain in her knees reported with proper technique   Neuro Re-ed    Neuro Re-ed Details  short pelvic floor contractions, alignment and technique for two stances when weeding in gardening (semi tandem 45 deg to object, and mini squat) , 10 reps each                 PT Education - 06/09/15 0714    Education provided Yes   Education Details HEP   Person(s) Educated Patient   Methods Explanation;Demonstration;Tactile cues;Verbal cues;Handout   Comprehension Verbalized understanding;Returned demonstration             PT Long Term Goals - 05/29/15 1050    PT LONG TERM GOAL #1   Title Pt will increase her FSFI score from 92% to  > 95% in order to improve her QOL.   Time 12   Period Weeks   Status New   PT LONG TERM GOAL #2   Title Pt will decrease her UDI-6 from 62.5% to < 50% in order to have uninterrupted sleep in the night.    Time 12   Period Weeks   Status New   PT LONG TERM GOAL #3   Title Pt will demo pelvic floor strength 3/7/7/7 in order to improve urogenital function and decrease use of pads.    Time 12   Period Weeks   Status New   PT LONG TERM GOAL #4   Title Pt will report no pain after sexual intercourse across 2x occasions in order to return ADLs.    PT LONG TERM GOAL #5   Time 12   Period Weeks   Status New               Plan - 06/09/15 0715    Clinical Impression Statement Pt shows improved pelvic floor coordination and advanced to short hold training. Pt states she did not have pain with intercourse last week but was reminded to be aware of maintaining breathing coordination with pelvic floor in future sexual activity. Pt required body mechanics training for gardening activities and sit to stand with dep core engaged and  proper alignment at her knees which pt demo'd correctly and reported no pain with knees. Pt  showed genu valgus and was educated on wider BOS in stance to off load knees. Pt continues to benefit from skilled PT.      Pt will benefit from skilled therapeutic intervention in order to improve on the following deficits Decreased balance;Hypomobility;Pain;Decreased activity tolerance;Decreased coordination;Decreased range of motion;Impaired tone;Increased fascial restricitons;Decreased endurance;Decreased safety awareness;Decreased mobility;Decreased strength;Postural dysfunction;Improper body mechanics   Rehab Potential Good   Clinical Impairments Affecting Rehab Potential poor improvement of urinary Sx post-vaginal prolapse surgery without PT   PT Treatment/Interventions ADLs/Self Care Home Management;Biofeedback;Electrical Stimulation;Cryotherapy;Aquatic Therapy;Moist  Heat;Therapeutic exercise;Therapeutic activities;Functional mobility training;Stair training;Gait training;Traction;Balance training;Neuromuscular re-education;Patient/family education;Manual techniques;Taping;Energy conservation;Dry needling;Passive range of motion   Consulted and Agree with Plan of Care Patient        Problem List Patient Active Problem List   Diagnosis Date Noted  . Frequent loose stools 02/14/2015  . Diverticulosis of colon without hemorrhage 02/14/2015  . Acute bronchitis 10/14/2014  . Colon cancer screening 08/19/2014  . Diverticulitis of colon without hemorrhage 05/25/2014  . Abdominal pain,  left lower quadrant 03/18/2014  . Other malaise and fatigue 02/25/2014  . Labile blood pressure 02/25/2014  . Encounter for Medicare annual wellness exam 07/16/2013  . Other screening mammogram 06/22/2011  . Intertrigo 06/22/2011  . KNEE PAIN, RIGHT 06/03/2010  . Hyperglycemia 05/14/2009  . RHEUMATOID ARTHRITIS 05/13/2008  . ELEVATED BP W/O HYPERTENSION 05/13/2008  . PAIN IN JOINT, HAND 06/13/2007  . Osteopenia 03/07/2007  . Hyperlipidemia 01/03/2007  . Obesity 01/03/2007  . ALLERGIC RHINITIS, SEASONAL 01/03/2007  . ASTHMA 01/03/2007  . OVERACTIVE BLADDER 01/03/2007    Jerl Mina  ,PT, DPT, E-RYT  06/09/2015, 7:20 AM  [place] MAIN [facility] SERVICES [address] [place], [place], [address] Phone: [phone]   Fax:  [phone]  Name: Cheryl Joseph MRN: [id number] Date of Birth: [birth year]

## 2015-06-12 ENCOUNTER — Ambulatory Visit: Payer: Medicare PPO | Admitting: Physical Therapy

## 2015-06-12 DIAGNOSIS — N8189 Other female genital prolapse: Secondary | ICD-10-CM

## 2015-06-12 DIAGNOSIS — M629 Disorder of muscle, unspecified: Secondary | ICD-10-CM | POA: Diagnosis not present

## 2015-06-12 DIAGNOSIS — R279 Unspecified lack of coordination: Secondary | ICD-10-CM | POA: Diagnosis not present

## 2015-06-12 NOTE — Patient Instructions (Addendum)
   YOUR HOME EXERCISE PROGRAM  -ELEVATOR TECHNIQUE   Position: on back     At rest, pelvic floor mm at "first floor".   Inhale and let go to allow "elevator of pelvic floor to lower to basement level".   Exhale, feel pelvic floor lift to "top floor".   Perform 10 repetitions, 3 sets, 3 times/day                       DECREASE DOWNWARD PRESSURE ON  YOUR PELVIC FLOOR, ABDOMINAL, LOW BACK MUSCLES       PRESERVE YOUR PELVIC HEALTH LONG-TERM   ** SQUEEZE pelvic floor BEFORE YOUR SNEEZE, COUGH, LAUGH   ** EXHALE BEFORE YOU RISE AGAINST GRAVITY (lifting, sit to stand, from squat to stand)   ** LOG ROLL OUT OF BED INSTEAD OF CRUNCH/SIT-UP       STAIRS: Stand at 45 deg towards rail.  exhaling with up and downstairs.  slow on the descending stairs.  Watch where you place your feet, they should be hip width.

## 2015-06-13 NOTE — Therapy (Signed)
[place] MAIN [facility] SERVICES [address] [place], [place], [address] Phone: [phone]   Fax:  [phone]  Physical Therapy Treatment  Patient Details  Name: Cheryl Joseph MRN: [id number] Date of Birth: 21-Jun-1941 Referring Provider: Gershon Mussel , NP  Encounter Date: 06/12/2015      PT End of Session - 06/13/15 1408    Visit Number 3   Number of Visits 12   Date for PT Re-Evaluation 08/06/15   Authorization Type G-code on 10th visit   PT Start Time 1005   PT Stop Time 1105   PT Time Calculation (min) 60 min   Activity Tolerance Patient tolerated treatment well;No increased pain   Behavior During Therapy [facility] for tasks assessed/performed      Past Medical History  Diagnosis Date  . HPV in female 1996    HPV with colposcopy (all neg paps since)  . Hyperlipidemia   . Asthma   . Osteopenia   . Obesity   . Arthritis     RA hands(seronegative)  . Back pain   . Shoulder pain   . Anginal pain ([place])   . GERD (gastroesophageal reflux disease)   . Shortness of breath dyspnea     Past Surgical History  Procedure Laterality Date  . Tonsillectomy    . Eye surgery    . Colonoscopy with propofol N/A 05/01/2015    Procedure: COLONOSCOPY WITH PROPOFOL;  Surgeon: Manya Silvas, MD;  Location: [facility] ENDOSCOPY;  Service: Endoscopy;  Laterality: N/A;  . Vaginal hysterectomy  2015    with pelvic organ prolapse surgery   . Pelvic organ prolapse surgery   2015    There were no vitals filed for this visit.  Visit Diagnosis:  Fascial defect  Pelvic floor weakness  Lack of coordination      Subjective Assessment - 06/12/15 1006    Subjective Pt reported no knee pain with the body mechanics techniques with weedin her garden. Pt has been practicing activating her pelvic floor mm with sit to stand but experiences back pain after performing the quick squeezes when laying down, The back pain dissipates quickly. Pt is sleeping  better at night than at the [facility].    Pertinent History Hx of asthma Dx 2001 : induced with cold weather and walking in elevated terrain, pt started taking probiotics  6 weeks ago which has helped with diarrhea associated diverticulitis, 3 vaginal deliveries, enjoy hiking with family,    Patient Stated Goals 1) sleep through the night 2) sexual intercources without pain             [facility] PT Assessment - 06/12/15 1048    Ambulation/Gait   Number of Stairs 32  poor eccentric control on descent, cues for wider BOS                  Pelvic Floor Special Questions - 06/12/15 1008    Pelvic Floor Internal Exam pt verbally consented and had no contraindications    Exam Type Vaginal   Palpation breathholding and activated 1st layer only, post_Tx: able to contraction 4/5     Strength good squeeze, good lift, able to hold agaisnt strong resistance  no pillow under hips   Strength # of reps 10   Strength # of seconds 1   Biofeedback not to progress to start endurance training yet.  cues for not progressing beyond instructions, less effort           [facility]  Adult PT Treatment/Exercise - 06/13/15 1354    Transfers   Comments feet to knee alignment cues   Ambulation/Gait   Stair Management Technique One rail Right;Alternating pattern  45 deg turn to rail, cues for deep core activation    Number of Stairs 32  poor eccentric control on descent, cues for wider BOS   Therapeutic Activites    Other Therapeutic Activities reviewed gardening body mechanics   no pain in her knees reported with proper technique   Neuro Re-ed    Neuro Re-ed Details  pelvic floor training:cued for no breathholding, follow instructions with program, less effort to minimize back pain   deep core engagement with stairs                PT Education - 06/12/15 1055    Education provided Yes   Education Details HEP, exhaling with ascending stairs and descending stairs, pelvic floor coordination     Person(s) Educated Patient   Methods Explanation;Demonstration;Tactile cues;Verbal cues;Handout   Comprehension Verbalized understanding;Returned demonstration             PT Long Term Goals - 05/29/15 1050    PT LONG TERM GOAL #1   Title Pt will increase her FSFI score from 92% to > 95% in order to improve her QOL.   Time 12   Period Weeks   Status New   PT LONG TERM GOAL #2   Title Pt will decrease her UDI-6 from 62.5% to < 50% in order to have uninterrupted sleep in the night.    Time 12   Period Weeks   Status New   PT LONG TERM GOAL #3   Title Pt will demo pelvic floor strength 3/7/7/7 in order to improve urogenital function and decrease use of pads.    Time 12   Period Weeks   Status New   PT LONG TERM GOAL #4   Title Pt will report no pain after sexual intercourse across 2x occasions in order to return ADLs.    PT LONG TERM GOAL #5   Time 12   Period Weeks   Status New               Plan - 06/13/15 1408    Clinical Impression Statement Pt showed good carry-over with deep core engagement with stair navigation with report of no knee pain. Pt required corrections with pelvic floor coordination to decrease breathholding and use of accessory mm.  Pt demo'd correctly post-training. Pt will continue to benefit from skilled PT.    Pt will benefit from skilled therapeutic intervention in order to improve on the following deficits Decreased balance;Hypomobility;Pain;Decreased activity tolerance;Decreased coordination;Decreased range of motion;Impaired tone;Increased fascial restricitons;Decreased endurance;Decreased safety awareness;Decreased mobility;Decreased strength;Postural dysfunction;Improper body mechanics   Rehab Potential Good   Clinical Impairments Affecting Rehab Potential poor improvement of urinary Sx post-vaginal prolapse surgery without PT   PT Treatment/Interventions ADLs/Self Care Home Management;Biofeedback;Electrical Stimulation;Cryotherapy;Aquatic  Therapy;Moist Heat;Therapeutic exercise;Therapeutic activities;Functional mobility training;Stair training;Gait training;Traction;Balance training;Neuromuscular re-education;Patient/family education;Manual techniques;Taping;Energy conservation;Dry needling;Passive range of motion   Consulted and Agree with Plan of Care Patient        Problem List Patient Active Problem List   Diagnosis Date Noted  . Frequent loose stools 02/14/2015  . Diverticulosis of colon without hemorrhage 02/14/2015  . Acute bronchitis 10/14/2014  . Colon cancer screening 08/19/2014  . Diverticulitis of colon without hemorrhage 05/25/2014  . Abdominal pain, left lower quadrant 03/18/2014  . Other malaise and fatigue 02/25/2014  . Labile blood  pressure 02/25/2014  . Encounter for Medicare annual wellness exam 07/16/2013  . Other screening mammogram 06/22/2011  . Intertrigo 06/22/2011  . KNEE PAIN, RIGHT 06/03/2010  . Hyperglycemia 05/14/2009  . RHEUMATOID ARTHRITIS 05/13/2008  . ELEVATED BP W/O HYPERTENSION 05/13/2008  . PAIN IN JOINT, HAND 06/13/2007  . Osteopenia 03/07/2007  . Hyperlipidemia 01/03/2007  . Obesity 01/03/2007  . ALLERGIC RHINITIS, SEASONAL 01/03/2007  . ASTHMA 01/03/2007  . OVERACTIVE BLADDER 01/03/2007    Jerl Mina ,PT, DPT, E-RYT  06/13/2015, 2:11 PM  [place] MAIN [facility] SERVICES [address] [place], [place], [address] Phone: [phone]   Fax:  [phone]  Name: Cheryl Joseph MRN: [id number] Date of Birth: 01-08-1941

## 2015-06-18 ENCOUNTER — Ambulatory Visit: Payer: Medicare PPO | Admitting: Physical Therapy

## 2015-06-18 DIAGNOSIS — R279 Unspecified lack of coordination: Secondary | ICD-10-CM | POA: Diagnosis not present

## 2015-06-18 DIAGNOSIS — N8189 Other female genital prolapse: Secondary | ICD-10-CM

## 2015-06-18 DIAGNOSIS — M629 Disorder of muscle, unspecified: Secondary | ICD-10-CM

## 2015-06-18 DIAGNOSIS — H5319 Other subjective visual disturbances: Secondary | ICD-10-CM | POA: Diagnosis not present

## 2015-06-18 NOTE — Patient Instructions (Addendum)
By the wall   Elbow and head press    Folded towel head and wall and tuck chin like you are holding an apple under chin to not let towel fall  Pressing elbow and head back towards wall and towel for the duration of your exhale breath.   10 reps  X 3 reps x 2 day   _____________________  Dynamic stabilization level 2 (single knee out)  10 x 3 sets  X 2 day    _____________________  Pelvic floor exercises  When exhaling, maintain neutral chin position and shoulders pressed into bed    Inhale, then exhale then squeeze at the top of "elevator, pelvic floor lifts naturally 10 sec holds 1-thousand count aloud   Release pelvic floor with inhale (basement)    3 breaths for t your rest break   On the 4th breath, exhale and begin 10 sec squeezes again   3 reps for 10 sec holds,. 3 x a day  _____________________  Backward hug around tree when hiking and gaze upward (this can be a rest break when hiking to manage asthma : open the lungs and chest)

## 2015-06-18 NOTE — Therapy (Signed)
[place] MAIN [facility] SERVICES [address] [place], [place], [address] Phone: [phone]   Fax:  [phone]  Physical Therapy Treatment  Patient Details  Name: Cheryl Joseph MRN: [id number] Date of Birth: [birth year] Referring Provider: Gershon Mussel , NP  Encounter Date: 06/18/2015    Past Medical History  Diagnosis Date  . HPV in female 1996    HPV with colposcopy (all neg paps since)  . Hyperlipidemia   . Asthma   . Osteopenia   . Obesity   . Arthritis     RA hands(seronegative)  . Back pain   . Shoulder pain   . Anginal pain ([place])   . GERD (gastroesophageal reflux disease)   . Shortness of breath dyspnea     Past Surgical History  Procedure Laterality Date  . Tonsillectomy    . Eye surgery    . Colonoscopy with propofol N/A 05/01/2015    Procedure: COLONOSCOPY WITH PROPOFOL;  Surgeon: Manya Silvas, MD;  Location: [facility] ENDOSCOPY;  Service: Endoscopy;  Laterality: N/A;  . Vaginal hysterectomy  2015    with pelvic organ prolapse surgery   . Pelvic organ prolapse surgery   2015    There were no vitals filed for this visit.  Visit Diagnosis:  Fascial defect  Pelvic floor weakness  Lack of coordination      Subjective Assessment - 06/18/15 1108    Subjective Pt gets up 2x night insitead of 3-4 x night to uriante. Pt had 90% improvement with knee pain with stairs because she remembered to exhale with exertion. Pt feels her exercises are going easier.  Pt has noticed her diaphragmtic breathing practice has helped with her energy level,  asthma.     Pertinent History Hx of asthma Dx 2001 : induced with cold weather and walking in elevated terrain, pt started taking probiotics  6 weeks ago which has helped with diarrhea associated diverticulitis, 3 vaginal deliveries, enjoy hiking with family,    Patient Stated Goals 1) sleep through the night 2) sexual intercources without pain             [facility] PT  Assessment - 06/18/15 1116    Observation/Other Assessments   Observations forward head 16 cm from auditory meatus bilaterally.  increased cervico-thoracic kyphosis  15 cm, depression of upper trap with exhalepost-Tx   Palpation   Palpation comment hypomobility along C7/T, mm tensions upper traps and paraspinals cervothoracic region                     [facility] Adult PT Treatment/Exercise - 06/18/15 1453    Exercises   Exercises --  see pt instructions    Manual Therapy   Joint Mobilization Grade II-II AP mobs along lower cervical and T1, distraction and side glides   no pain reported during Tx,   Soft tissue mobilization supine, sustained pressure along mm close to spinous process, upper trap, levator scapulae bilaterally                     PT Long Term Goals - 06/18/15 1118    PT LONG TERM GOAL #1   Title Pt will increase her FSFI score from 92% to > 95% in order to improve her QOL.   Time 12   Period Weeks   Status Partially Met   PT LONG TERM GOAL #2   Title Pt will decrease her UDI-6 from 62.5% to < 50% in order to  have uninterrupted sleep in the night. (06/18/15: 50%)    Time 12   Period Weeks   Status Achieved   PT LONG TERM GOAL #3   Title Pt will demo pelvic floor strength 3/7/7/7 in order to improve urogenital function and decrease use of pads.    Time 12   Period Weeks   Status Partially Met   PT LONG TERM GOAL #4   Title Pt will report no pain after sexual intercourse across 2x occasions in order to return ADLs.    Time 12   Period Weeks   Status Achieved   PT LONG TERM GOAL #5   Title Pt will demo more upright posture with increased mobility in cervico-thoracic joints in order to realign forces and pressure onto pelvic floor through spinal curves for balance and hiking.    Time 12   Period Weeks   Status New   Additional Long Term Goals   Additional Long Term Goals Yes               Plan - 06/18/15 1454    Clinical  Impression Statement Pt required review of dynamic stabilization exercises as she showed confusion with form. Pt able to perform correctly post-review. Pt demo'd increased posterior chain of extensor mm at cerviothoracic region post Tx with less forward head.  Anticipate pt will achieve goals with a few more visits.     Pt will benefit from skilled therapeutic intervention in order to improve on the following deficits Decreased balance;Hypomobility;Pain;Decreased activity tolerance;Decreased coordination;Decreased range of motion;Impaired tone;Increased fascial restricitons;Decreased endurance;Decreased safety awareness;Decreased mobility;Decreased strength;Postural dysfunction;Improper body mechanics   Rehab Potential Good   Clinical Impairments Affecting Rehab Potential poor improvement of urinary Sx post-vaginal prolapse surgery without PT   PT Treatment/Interventions ADLs/Self Care Home Management;Biofeedback;Electrical Stimulation;Cryotherapy;Aquatic Therapy;Moist Heat;Therapeutic exercise;Therapeutic activities;Functional mobility training;Stair training;Gait training;Traction;Balance training;Neuromuscular re-education;Patient/family education;Manual techniques;Taping;Energy conservation;Dry needling;Passive range of motion   Consulted and Agree with Plan of Care Patient        Problem List Patient Active Problem List   Diagnosis Date Noted  . Frequent loose stools 02/14/2015  . Diverticulosis of colon without hemorrhage 02/14/2015  . Acute bronchitis 10/14/2014  . Colon cancer screening 08/19/2014  . Diverticulitis of colon without hemorrhage 05/25/2014  . Abdominal pain, left lower quadrant 03/18/2014  . Other malaise and fatigue 02/25/2014  . Labile blood pressure 02/25/2014  . Encounter for Medicare annual wellness exam 07/16/2013  . Other screening mammogram 06/22/2011  . Intertrigo 06/22/2011  . KNEE PAIN, RIGHT 06/03/2010  . Hyperglycemia 05/14/2009  . RHEUMATOID ARTHRITIS  05/13/2008  . ELEVATED BP W/O HYPERTENSION 05/13/2008  . PAIN IN JOINT, HAND 06/13/2007  . Osteopenia 03/07/2007  . Hyperlipidemia 01/03/2007  . Obesity 01/03/2007  . ALLERGIC RHINITIS, SEASONAL 01/03/2007  . ASTHMA 01/03/2007  . OVERACTIVE BLADDER 01/03/2007    Jerl Mina ,PT, DPT, E-RYT  06/18/2015, 3:00 PM  [place] MAIN [facility] SERVICES [address] [place], [place], [address] Phone: [phone]   Fax:  [phone]  Name: SCHYLER BUTIKOFER MRN: [id number] Date of Birth: 09/24/1940

## 2015-06-27 ENCOUNTER — Ambulatory Visit: Payer: Medicare PPO | Attending: Nurse Practitioner | Admitting: Physical Therapy

## 2015-06-27 DIAGNOSIS — N8189 Other female genital prolapse: Secondary | ICD-10-CM | POA: Insufficient documentation

## 2015-06-27 DIAGNOSIS — R279 Unspecified lack of coordination: Secondary | ICD-10-CM | POA: Diagnosis not present

## 2015-06-27 DIAGNOSIS — M629 Disorder of muscle, unspecified: Secondary | ICD-10-CM | POA: Diagnosis not present

## 2015-06-27 NOTE — Therapy (Addendum)
[place] MAIN [facility] SERVICES [address] [place], [place], [address] Phone: [phone]   Fax:  [phone]  Physical Therapy Treatment  Patient Details  Name: Cheryl Joseph MRN: [id number] Date of Birth: 1941-07-10 Referring Provider: Gershon Mussel , NP  Encounter Date: 06/27/2015    Past Medical History  Diagnosis Date  . HPV in female 1996    HPV with colposcopy (all neg paps since)  . Hyperlipidemia   . Asthma   . Osteopenia   . Obesity   . Arthritis     RA hands(seronegative)  . Back pain   . Shoulder pain   . Anginal pain ([place])   . GERD (gastroesophageal reflux disease)   . Shortness of breath dyspnea     Past Surgical History  Procedure Laterality Date  . Tonsillectomy    . Eye surgery    . Colonoscopy with propofol N/A 05/01/2015    Procedure: COLONOSCOPY WITH PROPOFOL;  Surgeon: Manya Silvas, MD;  Location: [facility] ENDOSCOPY;  Service: Endoscopy;  Laterality: N/A;  . Vaginal hysterectomy  2015    with pelvic organ prolapse surgery   . Pelvic organ prolapse surgery   2015    There were no vitals filed for this visit.  Visit Diagnosis:  Fascial defect  Pelvic floor weakness  Lack of coordination      Subjective Assessment - 06/30/15 2042    Subjective Pt gets up 1x per night to urinate.    Pertinent History Hx of asthma Dx 2001 : induced with cold weather and walking in elevated terrain, pt started taking probiotics  6 weeks ago which has helped with diarrhea associated diverticulitis, 3 vaginal deliveries, enjoy hiking with family,    Patient Stated Goals 1) sleep through the night 2) sexual intercources without pain             [facility] PT Assessment - 06/30/15 2036    Observation/Other Assessments   Observations post-Tx: able to maintain more cervical retraction    Other Surveys  --  SOC)-> 11/22:  FSFI 93% -> 100%, UDI-6: 62.5 -> 50%   Posture/Postural Control   Posture Comments decreased  forward head and increased ability to for cervical retraction    Palpation   SI assessment  pre-Tx: hypomobilie upper T segments in supine, increased tensions in upper Traps, post-Tx: increased mobility, decreased tensions in upper trap bilaterally                     [facility] Adult PT Treatment/Exercise - 06/30/15 2036    Neuro Re-ed    Neuro Re-ed Details  see pt instructions    Exercises   Exercises --  see pt instructions   Manual Therapy   Joint Mobilization Grade II-III PA in supine upper T segments, inferior mob at sipine of scapular, MWM open book at paraspinal. distraction at occiput                      PT Long Term Goals - 06/27/15 1006    PT LONG TERM GOAL #1   Title Pt will increase her FSFI score from 92% to > 95% in order to improve her QOL. (11/22: 100%)    Time 12   Period Weeks   Status Achieved   PT LONG TERM GOAL #2   Title Pt will decrease her UDI-6 from 62.5% to < 50% in order to have uninterrupted sleep in the night. (06/18/15: 50%)  Time 12   Period Weeks   Status Achieved   PT LONG TERM GOAL #3   Title Pt will demo pelvic floor strength 3/7/7/7 in order to improve urogenital function and decrease use of pads.    Time 12   Period Weeks   Status Achieved   PT LONG TERM GOAL #4   Title Pt will report no pain after sexual intercourse across 2x occasions in order to return ADLs.    Time 12   Period Weeks   Status Achieved   PT LONG TERM GOAL #5   Title Pt will demo more upright posture with increased mobility in cervico-thoracic joints in order to realign forces and pressure onto pelvic floor through spinal curves for balance and hiking.    Time 12   Period Weeks   Status Partially Met               Plan - 06/30/15 2041    Clinical Impression Statement Pt has one remaining goal to achieve. Pt continues to make excellent progress with her urinary Sx. Anticipate pt may be ready for D/C at next session.    Pt will benefit  from skilled therapeutic intervention in order to improve on the following deficits Decreased balance;Hypomobility;Pain;Decreased activity tolerance;Decreased coordination;Decreased range of motion;Impaired tone;Increased fascial restricitons;Decreased endurance;Decreased safety awareness;Decreased mobility;Decreased strength;Postural dysfunction;Improper body mechanics   Rehab Potential Good   Clinical Impairments Affecting Rehab Potential poor improvement of urinary Sx post-vaginal prolapse surgery without PT   PT Frequency 1x / week   PT Treatment/Interventions ADLs/Self Care Home Management;Biofeedback;Electrical Stimulation;Cryotherapy;Aquatic Therapy;Moist Heat;Therapeutic exercise;Therapeutic activities;Functional mobility training;Stair training;Gait training;Traction;Balance training;Neuromuscular re-education;Patient/family education;Manual techniques;Taping;Energy conservation;Dry needling;Passive range of motion   Consulted and Agree with Plan of Care Patient        Problem List Patient Active Problem List   Diagnosis Date Noted  . Frequent loose stools 02/14/2015  . Diverticulosis of colon without hemorrhage 02/14/2015  . Acute bronchitis 10/14/2014  . Colon cancer screening 08/19/2014  . Diverticulitis of colon without hemorrhage 05/25/2014  . Abdominal pain, left lower quadrant 03/18/2014  . Other malaise and fatigue 02/25/2014  . Labile blood pressure 02/25/2014  . Encounter for Medicare annual wellness exam 07/16/2013  . Other screening mammogram 06/22/2011  . Intertrigo 06/22/2011  . KNEE PAIN, RIGHT 06/03/2010  . Hyperglycemia 05/14/2009  . RHEUMATOID ARTHRITIS 05/13/2008  . ELEVATED BP W/O HYPERTENSION 05/13/2008  . PAIN IN JOINT, HAND 06/13/2007  . Osteopenia 03/07/2007  . Hyperlipidemia 01/03/2007  . Obesity 01/03/2007  . ALLERGIC RHINITIS, SEASONAL 01/03/2007  . ASTHMA 01/03/2007  . OVERACTIVE BLADDER 01/03/2007    Jerl Mina  ,PT, DPT,  E-RYT  06/30/2015, 8:43 PM  [place] MAIN [facility] SERVICES [address] [place], [place], [address] Phone: [phone]   Fax:  [phone]  Name: Cheryl Joseph MRN: [id number] Date of Birth: 1940-08-07

## 2015-06-27 NOTE — Patient Instructions (Addendum)
Chin tucks with exhalations  10reps x 2  Palm flip to stretch between shoulder blades  10 rep x 2  Open book  15 reps  (handout )   Seated pelvic floor exercises with duration of an exhale : press hands down onto bed (to activate shoulders down)   10 reps    Pulling hands by pockets with yellow band at door knob  Stand with one leg forward, other leg back and hip wdith     10 reps each leg forward x 2 sets   "open up" "hold apple under chin" "exhale shoulder blades together and lower like you are not leting a pencil drop out from armpits" (avoid raising shoulders to ears)    Walking: "think of a string pulling up your spine"

## 2015-07-02 ENCOUNTER — Encounter: Payer: Medicare PPO | Admitting: Physical Therapy

## 2015-07-04 ENCOUNTER — Encounter: Payer: Medicare PPO | Admitting: Physical Therapy

## 2015-07-08 ENCOUNTER — Telehealth: Payer: Self-pay | Admitting: *Deleted

## 2015-07-08 NOTE — Telephone Encounter (Signed)
Entered in error

## 2015-07-11 ENCOUNTER — Ambulatory Visit: Payer: Medicare PPO | Admitting: Physical Therapy

## 2015-07-11 DIAGNOSIS — N8189 Other female genital prolapse: Secondary | ICD-10-CM

## 2015-07-11 DIAGNOSIS — R279 Unspecified lack of coordination: Secondary | ICD-10-CM

## 2015-07-11 DIAGNOSIS — M629 Disorder of muscle, unspecified: Secondary | ICD-10-CM | POA: Diagnosis not present

## 2015-07-11 NOTE — Therapy (Addendum)
[place] MAIN [facility] SERVICES [address] [place], [place], [address] Phone: [phone]   Fax:  [phone]  Physical Therapy Treatment / Discharge Summary   Patient Details  Name: Cheryl Joseph MRN: [id number] Date of Birth: November 03, 1940 Referring Provider: Gershon Mussel , NP  Encounter Date: 07/11/2015      PT End of Session - 07/15/15 1157    Visit Number 5   Number of Visits 12   Date for PT Re-Evaluation 08/06/15   Authorization Type G-code on 10th visit   PT Start Time 1100   PT Stop Time 1130   PT Time Calculation (min) 30 min      Past Medical History  Diagnosis Date  . HPV in female 1996    HPV with colposcopy (all neg paps since)  . Hyperlipidemia   . Asthma   . Osteopenia   . Obesity   . Arthritis     RA hands(seronegative)  . Back pain   . Shoulder pain   . Anginal pain ([place])   . GERD (gastroesophageal reflux disease)   . Shortness of breath dyspnea     Past Surgical History  Procedure Laterality Date  . Tonsillectomy    . Eye surgery    . Colonoscopy with propofol N/A 05/01/2015    Procedure: COLONOSCOPY WITH PROPOFOL;  Surgeon: Manya Silvas, MD;  Location: [facility] ENDOSCOPY;  Service: Endoscopy;  Laterality: N/A;  . Vaginal hysterectomy  2015    with pelvic organ prolapse surgery   . Pelvic organ prolapse surgery   2015    There were no vitals filed for this visit.  Visit Diagnosis:  Fascial defect  Pelvic floor weakness  Lack of coordination      Subjective Assessment - 07/15/15 1153    Subjective Pt continues to perform her HEP and is able to sleep with the need to get up to urinate only 1 x /night.  Pt did experience one incident of pain with sexual intercourse across a 7 week period since [facility]. This incident lasted  2 hours while in the past her pain would last all day. With further questioning, pt reported this pain incident occurred after a day of walking for 6 hrs straight.  Pt has  not felt any back pain with activties since [facility].  Pt does feel knee pain w/ stairclmbing.      Pertinent History Hx of asthma Dx 2001 : induced with cold weather and walking in elevated terrain, pt started taking probiotics  6 weeks ago which has helped with diarrhea associated diverticulitis, 3 vaginal deliveries, enjoy hiking with family,    Patient Stated Goals 1) sleep through the night 2) sexual intercources without pain             [facility] PT Assessment - 07/15/15 1153    Observation/Other Assessments   Observations upper trap elevation with lat pull down exercise, able to demo correctly post-training   Other Surveys  --  SOC)-> 11/22:  FSFI 93% -> 100%, UDI-6: 62.5 -> 50%                     [facility] Adult PT Treatment/Exercise - 07/15/15 1156    Therapeutic Activites    Other Therapeutic Activities reassess goals, educated about d/c    Neuro Re-ed    Neuro Re-ed Details  scapular stabilization with lat pull down exercise in HEP tactile cues given  PT Education - 08/01/15 1156    Education provided Yes   Education Details D/C, resources for senior center classes for health and wellness   Person(s) Educated Patient   Methods Explanation;Demonstration;Tactile cues;Verbal cues;Handout   Comprehension Verbalized understanding;Returned demonstration             PT Long Term Goals - 08/01/2015 1158    PT LONG TERM GOAL #1   Title Pt will increase her FSFI score from 92% to > 95% in order to improve her QOL. (11/22: 100%)    Time 12   Period Weeks   Status Achieved   PT LONG TERM GOAL #2   Title Pt will decrease her UDI-6 from 62.5% to < 50% in order to have uninterrupted sleep in the night. (06/18/15: 50%)    Time 12   Period Weeks   Status Achieved   PT LONG TERM GOAL #3   Title Pt will demo pelvic floor strength 3/7/7/7 in order to improve urogenital function and decrease use of pads.    Time 12   Period Weeks   Status Achieved    PT LONG TERM GOAL #4   Title Pt will report no pain after sexual intercourse across 2x occasions in order to return ADLs.    Time 12   Period Weeks   Status Achieved   PT LONG TERM GOAL #5   Title Pt will demo more upright posture with increased mobility in cervico-thoracic joints in order to realign forces and pressure onto pelvic floor through spinal curves for balance and hiking.    Time 12   Period Weeks   Status Achieved               Plan - 08/01/15 1158    Clinical Impression Statement Pt reported her urinary and dyspareunia Sx have improved "A Very Great Deal Better" since [facility]. Pt has achieved 100% of her goals.She improved on her UDI and FSFI scores, demonstrating improved urogenital function. Pt gained her ability to prevent urinary leakage, to sleep through the night with decreased nocturia episodes from 1-4x/ night to consistently  1x/ night, and has returned to enjoying in sexual intercourse without pain. Pt demonstrates significantly improved pelvic floor coordination with proper breathing and improved pelvic floor endurance and strength to maintain bladder within introitus in a more cephalad position. Pt also demonstrated improved posture and gait/ lifting mechanics with report of decreased low back and knee pain. Pt is ready for D/C. Thank you for your referral .     Pt will benefit from skilled therapeutic intervention in order to improve on the following deficits Decreased balance;Hypomobility;Pain;Decreased activity tolerance;Decreased coordination;Decreased range of motion;Impaired tone;Increased fascial restricitons;Decreased endurance;Decreased safety awareness;Decreased mobility;Decreased strength;Postural dysfunction;Improper body mechanics   Rehab Potential Good   Clinical Impairments Affecting Rehab Potential poor improvement of urinary Sx post-vaginal prolapse surgery without PT   PT Frequency 1x / week   PT Treatment/Interventions ADLs/Self Care Home  Management;Biofeedback;Electrical Stimulation;Cryotherapy;Aquatic Therapy;Moist Heat;Therapeutic exercise;Therapeutic activities;Functional mobility training;Stair training;Gait training;Traction;Balance training;Neuromuscular re-education;Patient/family education;Manual techniques;Taping;Energy conservation;Dry needling;Passive range of motion   Consulted and Agree with Plan of Care Patient          G-Codes - 2015/08/01 1206/11/12    Functional Assessment Tool Used FSFI 92% ---> 100% , UDI 62.5% --> 50%     Functional Limitation Self care   Self Care Current Status [id number]) At least 20 percent but less than 40 percent impaired, limited or restricted   Self Care Goal Status [id number]) At least  1 percent but less than 20 percent impaired, limited or restricted   Self Care Discharge Status [phone]) 0 percent impaired, limited or restricted      Problem List Patient Active Problem List   Diagnosis Date Noted  . Frequent loose stools 02/14/2015  . Diverticulosis of colon without hemorrhage 02/14/2015  . Acute bronchitis 10/14/2014  . Colon cancer screening 08/19/2014  . Diverticulitis of colon without hemorrhage 05/25/2014  . Abdominal pain, left lower quadrant 03/18/2014  . Other malaise and fatigue 02/25/2014  . Labile blood pressure 02/25/2014  . Encounter for Medicare annual wellness exam 07/16/2013  . Other screening mammogram 06/22/2011  . Intertrigo 06/22/2011  . KNEE PAIN, RIGHT 06/03/2010  . Hyperglycemia 05/14/2009  . RHEUMATOID ARTHRITIS 05/13/2008  . ELEVATED BP W/O HYPERTENSION 05/13/2008  . PAIN IN JOINT, HAND 06/13/2007  . Osteopenia 03/07/2007  . Hyperlipidemia 01/03/2007  . Obesity 01/03/2007  . ALLERGIC RHINITIS, SEASONAL 01/03/2007  . ASTHMA 01/03/2007  . OVERACTIVE BLADDER 01/03/2007    Jerl Mina ,PT, DPT, E-RYT  07/15/2015, 12:09 PM  [place] MAIN [facility] SERVICES [address] [place], [place], [address] Phone:  [phone]   Fax:  [phone]  Name: Cheryl Joseph MRN: [id number] Date of Birth: July 16, 1941

## 2015-07-15 DIAGNOSIS — M629 Disorder of muscle, unspecified: Secondary | ICD-10-CM | POA: Diagnosis not present

## 2015-09-10 ENCOUNTER — Other Ambulatory Visit: Payer: Self-pay | Admitting: Family Medicine

## 2015-09-25 ENCOUNTER — Ambulatory Visit: Payer: Self-pay | Admitting: Podiatry

## 2015-09-30 ENCOUNTER — Ambulatory Visit (INDEPENDENT_AMBULATORY_CARE_PROVIDER_SITE_OTHER): Payer: Medicare PPO

## 2015-09-30 ENCOUNTER — Ambulatory Visit (INDEPENDENT_AMBULATORY_CARE_PROVIDER_SITE_OTHER): Payer: Medicare PPO | Admitting: Podiatry

## 2015-09-30 ENCOUNTER — Encounter: Payer: Self-pay | Admitting: Podiatry

## 2015-09-30 VITALS — BP 177/82 | HR 73 | Resp 18

## 2015-09-30 DIAGNOSIS — L84 Corns and callosities: Secondary | ICD-10-CM | POA: Diagnosis not present

## 2015-09-30 DIAGNOSIS — B351 Tinea unguium: Secondary | ICD-10-CM

## 2015-09-30 DIAGNOSIS — M2040 Other hammer toe(s) (acquired), unspecified foot: Secondary | ICD-10-CM

## 2015-09-30 DIAGNOSIS — R52 Pain, unspecified: Secondary | ICD-10-CM | POA: Diagnosis not present

## 2015-09-30 NOTE — Progress Notes (Signed)
   Subjective:    Patient ID: Cheryl Joseph, female    DOB: 1940-08-03, 75 y.o.   MRN: [id number]  HPI  75 year old female presents to the office today for concerns of a corn on her right 2nd toe and a hammertoe to the same toe as well. The area is painful with shoes and pressure. This has been ongoing for about 2 years. No recent injury or trauma. She does use a pad to help take pressure off. Also her left big toenail is thick and discolored and has been ongoing for about 4-5 years.     Review of Systems  All other systems reviewed and are negative.      Objective:   Physical Exam General: AAO x3, NAD  Dermatological: Nails are previous somewhat dystrophic, discolored, hypertrophic. There is no tenderness to palpation of the nails there is no surrounding erythema or drainage.   Vascular: Dorsalis Pedis artery and Posterior Tibial artery pedal pulses are 2/4 bilateral with immedate capillary fill time. Pedal hair growth present. No varicosities and no lower extremity edema present bilateral. There is no pain with calf compression, swelling, warmth, erythema.   Neruologic: Grossly intact via light touch bilateral. Vibratory intact via tuning fork bilateral. Protective threshold with Semmes Wienstein monofilament intact to all pedal sites bilateral. Patellar and Achilles deep tendon reflexes 2+ bilateral. No Babinski or clonus noted bilateral.   Musculoskeletal: Hammertoe contractures are present mostly of the second toe on the right foot. On the dorsal PIPJ of the right second toe is a hyperkeratotic lesion. Upon debridement there is no underlying ulceration, drainage or other signs of infection. There is no other areas of tenderness to bilateral lower extremities.  Gait: Unassisted, Nonantalgic.       Assessment & Plan:  75 year old female right second digit dorsal hyperkeratotic lesion due to hammertoe, onychomycosis -Treatment options discussed including all alternatives, risks,  and complications -Etiology of symptoms were discussed -X-rays were obtained and reviewed with the patient. Hammertoe contractures are present. No evidence of acute fracture or stress fracture. -Hyperkeratotic lesion was debrided without complications of bleeding.  Discussed both conservative and surgical treatments. She wishes to proceed with conservative treatment. His crest shoe gear modifications, offloading and pads were dispensed. -Discussed her options for nail fungus. At this time she wishes to hold off on any oral treatment and she will start an over-the-counter fungal treatment if she elects to proceed with any treatment. -Follow-up as needed. Call questions concerns or any changes symptoms in the meantime.  [name], DPM

## 2015-10-28 ENCOUNTER — Ambulatory Visit (INDEPENDENT_AMBULATORY_CARE_PROVIDER_SITE_OTHER): Payer: Medicare PPO | Admitting: Podiatry

## 2015-10-28 ENCOUNTER — Encounter: Payer: Self-pay | Admitting: Podiatry

## 2015-10-28 DIAGNOSIS — M2040 Other hammer toe(s) (acquired), unspecified foot: Secondary | ICD-10-CM | POA: Insufficient documentation

## 2015-10-28 DIAGNOSIS — L84 Corns and callosities: Secondary | ICD-10-CM | POA: Insufficient documentation

## 2015-10-28 NOTE — Progress Notes (Signed)
Patient ID: Cheryl Joseph, female   DOB: Apr 12, 1941, 75 y.o.   MRN: [id number]  Subjective: 75 year old female presents the office they for follow-up evaluation of right second toe hammertoes also quite a tap the toe. She says there does continue to rub and cause pain. She has tried multiple pads that any relief. No recent injury or trauma. She states that she's having difficulty buying shoes and she has tried wearing a wider shoe without any relief. Denies any systemic complaints such as fevers, chills, nausea, vomiting. No acute changes since last appointment, and no other complaints at this time.   Objective: AAO x3, NAD DP/PT pulses palpable bilaterally, CRT less than 3 seconds Protective sensation intact with Simms Weinstein monofilament Semirigid hammertoe contractures most notably overlying the right second toe. Small hyperkeratotic lesion on the dorsal PIPJ of the right second toe is slight irritation from shoe gear. Upon debridement there is no underlying ulceration, drainage or other signs of infection. There is tenderness overlying this area. No areas of pinpoint bony tenderness or pain with vibratory sensation. MMT 5/5, ROM WNL. No edema, erythema, increase in warmth to bilateral lower extremities.  No open lesions or pre-ulcerative lesions.  No pain with calf compression, swelling, warmth, erythema  Assessment: Symptomatic hammertoe right second toe  Plan: -All treatment options discussed with the patient including all alternatives, risks, complications.  -I discussed multiple treatment options including both conservative and surgical treatments. At this time the callus was debrided without complications or bleeding. Continue offloading pads. Discussed surgical intervention including PIPJ arthrodesis with the patient. She will consider doing this in the near future however we'll hold off for now. -Follow-up in the near future. -Patient encouraged to call the office with any  questions, concerns, change in symptoms.   [name], DPM

## 2015-10-28 NOTE — Patient Instructions (Signed)
Hammer Toes Hammer toes is a condition in which one or more of your toes is permanently flexed. CAUSES  This happens when a muscle imbalance or abnormal bone length makes your small toes buckle. This causes the toe joint to contract and the strong cord-like bands that attach muscles to the bones (tendons) in your toes to shorten.  SIGNS AND SYMPTOMS  Common symptoms of flexible hammer toes include:   A buildup of skin cells (corns). Corns occur where boney bumps come in frequent contact with hard surfaces. For example, where your shoes press and rub.  Irritation.  Inflammation.  Pain.  Limited motion in your toes. DIAGNOSIS  Hammer toes are diagnosed through a physical exam of your toes. During the exam, your health care provider may try to reproduce your symptoms by manipulating your foot. Often, X-ray exams are done to determine the degree of deformity and to make sure that the cause is not a fracture.  TREATMENT  Hammer toes can be treated with corrective surgery. There are several types of surgical procedures that can treat hammer toes. The most common procedures include:  Arthroplasty--A portion of the joint is surgically removed and your toe is straightened. The gap fills in with fibrous tissue. This procedure helps treat pain and deformity and helps restore function.  Fusion--Cartilage between the two bones of the affected joint is taken out and the bones fuse together into one longer bone. This helps keep your toe stable and reduces pain but leaves your toe stiff, yet straight.  Implantation--A portion of your bone is removed and replaced with an implant to restore motion.  Flexor tendon transfers--This procedure repositions the tendons that curl the toes down (flexor tendons). This may be done to release the deforming force that causes your toe to buckle. Several of these procedures require fixing your toe with a pin that is visible at the tip of your toe. The pin keeps the toe  straight during healing. Your health care provider will remove the pin usually within 4-8 weeks after the procedure.    This information is not intended to replace advice given to you by your health care provider. Make sure you discuss any questions you have with your health care provider.   Document Released: 07/09/2000 Document Revised: 07/17/2013 Document Reviewed: 03/19/2013 Elsevier Interactive Patient Education 2016 Elsevier Inc[employer]  

## 2015-11-02 ENCOUNTER — Other Ambulatory Visit: Payer: Self-pay | Admitting: Family Medicine

## 2015-11-05 ENCOUNTER — Other Ambulatory Visit: Payer: Self-pay | Admitting: Family Medicine

## 2015-11-07 ENCOUNTER — Telehealth: Payer: Self-pay | Admitting: Family Medicine

## 2015-11-10 NOTE — Telephone Encounter (Signed)
Pt left v/m requesting status of simvastatin to [facility] [address]. Called [phone] and no answer and no v/m.

## 2015-11-12 NOTE — Telephone Encounter (Signed)
Pt has checked with [facility] and no simvastatin refill; I spoke with Jasmine at [address] and pharmacy did receive refill ok but med not filled; [place] not sure why but will get ready for pick up. Pt will ck with pharmacy to pick up med later today.

## 2015-11-23 ENCOUNTER — Emergency Department
Admission: EM | Admit: 2015-11-23 | Discharge: 2015-11-23 | Disposition: A | Discharge status: Home / Self Care | Payer: Medicare PPO | Attending: Emergency Medicine | Admitting: Emergency Medicine

## 2015-11-23 ENCOUNTER — Telehealth: Payer: Self-pay | Admitting: Family Medicine

## 2015-11-23 ENCOUNTER — Encounter: Payer: Self-pay | Admitting: Emergency Medicine

## 2015-11-23 DIAGNOSIS — L255 Unspecified contact dermatitis due to plants, except food: Secondary | ICD-10-CM | POA: Diagnosis not present

## 2015-11-23 DIAGNOSIS — E669 Obesity, unspecified: Secondary | ICD-10-CM | POA: Insufficient documentation

## 2015-11-23 DIAGNOSIS — Z79899 Other long term (current) drug therapy: Secondary | ICD-10-CM | POA: Diagnosis not present

## 2015-11-23 DIAGNOSIS — E785 Hyperlipidemia, unspecified: Secondary | ICD-10-CM | POA: Insufficient documentation

## 2015-11-23 DIAGNOSIS — Z0000 Encounter for general adult medical examination without abnormal findings: Secondary | ICD-10-CM

## 2015-11-23 DIAGNOSIS — M1990 Unspecified osteoarthritis, unspecified site: Secondary | ICD-10-CM | POA: Insufficient documentation

## 2015-11-23 DIAGNOSIS — J45909 Unspecified asthma, uncomplicated: Secondary | ICD-10-CM | POA: Diagnosis not present

## 2015-11-23 DIAGNOSIS — R739 Hyperglycemia, unspecified: Secondary | ICD-10-CM

## 2015-11-23 DIAGNOSIS — T7840XA Allergy, unspecified, initial encounter: Secondary | ICD-10-CM | POA: Diagnosis present

## 2015-11-23 DIAGNOSIS — R030 Elevated blood-pressure reading, without diagnosis of hypertension: Secondary | ICD-10-CM

## 2015-11-23 DIAGNOSIS — L247 Irritant contact dermatitis due to plants, except food: Secondary | ICD-10-CM

## 2015-11-23 MED ORDER — RANITIDINE HCL 150 MG PO TABS: 150.0000 mg | ORAL_TABLET | Freq: Two times a day (BID) | ORAL | Status: DC

## 2015-11-23 MED ORDER — DEXAMETHASONE 4 MG PO TABS: ORAL_TABLET | ORAL | Status: DC

## 2015-11-23 MED ORDER — DIPHENHYDRAMINE HCL 25 MG PO CAPS
50.0000 mg | ORAL_CAPSULE | Freq: Once | ORAL | Status: AC
  Administered 2015-11-23: 50 mg via ORAL
  Filled 2015-11-23: qty 2

## 2015-11-23 MED ORDER — DEXAMETHASONE SODIUM PHOSPHATE 10 MG/ML IJ SOLN
10.0000 mg | Freq: Once | INTRAMUSCULAR | Status: AC
  Administered 2015-11-23: 10 mg via INTRAMUSCULAR
  Filled 2015-11-23: qty 1

## 2015-11-23 MED ORDER — FAMOTIDINE 20 MG PO TABS
40.0000 mg | ORAL_TABLET | Freq: Once | ORAL | Status: AC
  Administered 2015-11-23: 40 mg via ORAL
  Filled 2015-11-23: qty 2

## 2015-11-23 NOTE — ED Provider Notes (Signed)
[facility] Emergency Department Provider Note ____________________________________________  Time seen: 1002  I have reviewed the triage vital signs and the nursing notes.  HISTORY  Chief Complaint  Allergic Reaction and Rash  HPI Cheryl Joseph is a 75 y.o. female since a the ED for evaluation of a spreading rash started on her right upper face on Friday evening. Patient describes since that time she's had increasing irritation, itching, and a spread of a fine blistery rash to the posterior right neck and chest, and hand. She denies any shortness of breath or difficulty breathing, but she did feel this morning as if her throat was getting tight. She also notes that she now has some irritation and swelling over her lips as well. She dose Benadryl last night with some minimal benefit. She denies any interim fevers, chills, sweats. She does admit to pulling some fresh flower blooms out of the bed that was overrun with poison ivy on Wednesday.  Past Medical History  Diagnosis Date  . HPV in female 1996    HPV with colposcopy (all neg paps since)  . Hyperlipidemia   . Asthma   . Osteopenia   . Obesity   . Arthritis     RA hands(seronegative)  . Back pain   . Shoulder pain   . Anginal pain ([place])   . GERD (gastroesophageal reflux disease)   . Shortness of breath dyspnea     Patient Active Problem List   Diagnosis Date Noted  . Hammertoe 10/28/2015  . Corns and callosities 10/28/2015  . Frequent loose stools 02/14/2015  . Diverticulosis of colon without hemorrhage 02/14/2015  . Acute bronchitis 10/14/2014  . Colon cancer screening 08/19/2014  . Diverticulitis of colon without hemorrhage 05/25/2014  . Abdominal pain, left lower quadrant 03/18/2014  . Other malaise and fatigue 02/25/2014  . Labile blood pressure 02/25/2014  . Encounter for Medicare annual wellness exam 07/16/2013  . Other screening mammogram 06/22/2011  . Intertrigo 06/22/2011  . KNEE  PAIN, RIGHT 06/03/2010  . Hyperglycemia 05/14/2009  . RHEUMATOID ARTHRITIS 05/13/2008  . ELEVATED BP W/O HYPERTENSION 05/13/2008  . PAIN IN JOINT, HAND 06/13/2007  . Osteopenia 03/07/2007  . Hyperlipidemia 01/03/2007  . Obesity 01/03/2007  . ALLERGIC RHINITIS, SEASONAL 01/03/2007  . ASTHMA 01/03/2007  . OVERACTIVE BLADDER 01/03/2007    Past Surgical History  Procedure Laterality Date  . Tonsillectomy    . Eye surgery    . Colonoscopy with propofol N/A 05/01/2015    Procedure: COLONOSCOPY WITH PROPOFOL;  Surgeon: Manya Silvas, MD;  Location: [facility] ENDOSCOPY;  Service: Endoscopy;  Laterality: N/A;  . Vaginal hysterectomy  2015    with pelvic organ prolapse surgery   . Pelvic organ prolapse surgery   2015    Current Outpatient Rx  Name  Route  Sig  Dispense  Refill  . albuterol (PROVENTIL HFA;VENTOLIN HFA) 108 (90 BASE) MCG/ACT inhaler   Inhalation   Inhale 2 puffs into the lungs every 4 (four) hours as needed for wheezing (and 2 puffs before exercise or exposure to cold air).   1 Inhaler   11   . aspirin-sod bicarb-citric acid (ALKA-SELTZER) 325 MG TBEF tablet   Oral   Take 325 mg by mouth every 6 (six) hours as needed.         Marland Kitchen b complex vitamins tablet   Oral   Take 1 tablet by mouth daily.         . benzonatate (TESSALON) 200 MG capsule  Oral   Take 1 capsule (200 mg total) by mouth 3 (three) times daily as needed for cough (swallow whole, do not bite pill).   30 capsule   1   . Calcium Carbonate-Vitamin D (CALCIUM-VITAMIN D3 PO)   Oral   Take 1 capsule by mouth daily.         Marland Kitchen dexamethasone (DECADRON) 4 MG tablet      Take 5 tabs daly x 3 days  15 Take 4 tabs daily x 3 days 12 Take 3 tabs daily x 3 days  9 Take 2 tabs daily x 3 days   6 Take 1 tab daily x 3 days    3   45 tablet   0   . glucosamine-chondroitin 500-400 MG tablet   Oral   Take 1 tablet by mouth daily.         Marland Kitchen guaiFENesin (MUCINEX) 600 MG 12 hr tablet   Oral   Take by  mouth 2 (two) times daily.         Marland Kitchen Hyaluronic Acid-Vitamin C (HYALURONIC ACID PO)   Oral   Take 1 tablet by mouth daily.         Marland Kitchen ketoconazole (NIZORAL) 2 % cream   Topical   Apply 1 application topically daily. To affected area   60 g   1   . montelukast (SINGULAIR) 10 MG tablet      TAKE ONE TABLET BY MOUTH AT BEDTIME   90 tablet   0   . Multiple Vitamin (MULTIVITAMIN) capsule   Oral   Take 1 capsule by mouth daily.         . NON FORMULARY   Oral   Take 1 capsule by mouth 2 (two) times daily. [place]         . Omega-3 Fatty Acids (FISH OIL PO)   Oral   Take 2 capsules by mouth daily.           Marland Kitchen oxybutynin (OXYTROL) 3.9 MG/24HR   Transdermal   Place 1 patch onto the skin every 3 (three) days.   8 patch   12   . ranitidine (ZANTAC) 150 MG tablet   Oral   Take 1 tablet (150 mg total) by mouth 2 (two) times daily.   20 tablet   0   . simvastatin (ZOCOR) 40 MG tablet      TAKE ONE TABLET BY MOUTH AT BEDTIME   90 tablet   0   . Ubiquinol 100 MG CAPS   Oral   Take 1 capsule by mouth 3 (three) times a week.          Allergies Ezetimibe-simvastatin; Tolterodine tartrate; and Vesicare  Family History  Problem Relation Age of Onset  . Diabetes Mother   . Hyperlipidemia Mother   . Heart disease Mother   . Hypertension Mother   . Obesity Mother   . Kyphosis Mother   . Heart disease Father   . Hypertension Father   . Cancer Father     lung Cancer smoker  . Kyphosis Sister   . Diabetes Brother    Social History Social History  Substance Use Topics  . Smoking status: Never Smoker   . Smokeless tobacco: None  . Alcohol Use: No    Review of Systems  Constitutional: Negative for fever. Eyes: Negative for visual changes. ENT: Negative for sore throat. Cardiovascular: Negative for chest pain. Respiratory: Negative for shortness of breath. Gastrointestinal: Negative for abdominal pain, vomiting and  diarrhea. Genitourinary: Negative for  dysuria. Musculoskeletal: Negative for back pain. Skin: Negative for rash. Neurological: Negative for headaches, focal weakness or numbness. ____________________________________________  PHYSICAL EXAM:  VITAL SIGNS: ED Triage Vitals  Enc Vitals Group     BP 11/23/15 0818 162/70 mmHg     Pulse Rate 11/23/15 0818 84     Resp 11/23/15 0818 18     Temp 11/23/15 0818 98.2 F (36.8 C)     Temp Source 11/23/15 0818 Oral     SpO2 11/23/15 0818 96 %     Weight 11/23/15 0818 190 lb (86.183 kg)     Height 11/23/15 0818 5\' 5"  (1.651 m)     Head Cir --      Peak Flow --      Pain Score --      Pain Loc --      Pain Edu? --      Excl. in [place]? --     Constitutional: Alert and oriented. Well appearing and in no distress. Head: Normocephalic and atraumatic.      Eyes: Conjunctivae are normal. PERRL. Normal extraocular movements      Ears: Canals clear. TMs intact bilaterally.   Nose: No congestion/rhinorrhea.   Mouth/Throat: Mucous membranes are moist. Uvula is midline and tonsils are flat. Oropharynx is clear of any edema or erythema. No angioedema is appreciated.   Neck: Supple. No thyromegaly. Hematological/Lymphatic/Immunological: No cervical lymphadenopathy. Cardiovascular: Normal rate, regular rhythm.  Respiratory: Normal respiratory effort. No wheezes/rales/rhonchi. Musculoskeletal: Nontender with normal range of motion in all extremities.  Neurologic:  Normal gait without ataxia. Normal speech and language. No gross focal neurologic deficits are appreciated. Skin:  Skin is warm, dry and intact. Patient with an erythematous rash that is maculopapular in nature to the face overlying the forehead the nose and the cheeks. She also has some encroachment over her upper lip as well. There are also scattered patches noted along the neck and shin as well as some posterior neck. She has similar lesions now showing up across her chest and anterior shoulders as well as the fingers of the  right hand. The rash appears to show fine blisters on erythematous base. ____________________________________________  PROCEDURES  Decadron 10 mg IM Benadryl 50 mg PO Famotidine 40 mg PO  ____________________________________________  INITIAL IMPRESSION / ASSESSMENT AND PLAN / ED COURSE  Patient with a presentation consistent with a contact dermatitis secondary to poison ivy/oak. She will be discharged with a prescription for a 12 day steroid taper and ranitidine. She will dose Benadryl as directed for histamine blockade. Follow-up with the PCP as needed.  ____________________________________________  FINAL CLINICAL IMPRESSION(S) / ED DIAGNOSES  Final diagnoses:  Contact dermatitis and eczema due to plant      Melvenia Needles, PA-C 11/23/15 [place], MD 11/23/15 [phone]

## 2015-11-23 NOTE — ED Notes (Signed)
Patient states that she was outside on Friday evening and was bit by something, Patient states that she noticed some redness on the right side of her face Friday evening, since then the redness has gotten worse and is spreading down her chest and onto her leg. Patient has swelling to the right side of the face. Patient denies shortness of breath but states "I feel like my throat is closing up." Patient states that her lips feel swollen. Patient has not taken anything this morning but states that she did take Benadryl last night.

## 2015-11-23 NOTE — Discharge Instructions (Signed)
Contact Dermatitis Dermatitis is redness, soreness, and swelling (inflammation) of the skin. Contact dermatitis is a reaction to certain substances that touch the skin. There are two types of contact dermatitis:   Irritant contact dermatitis. This type is caused by something that irritates your skin, such as dry hands from washing them too much. This type does not require previous exposure to the substance for a reaction to occur. This type is more common.  Allergic contact dermatitis. This type is caused by a substance that you are allergic to, such as a nickel allergy or poison ivy. This type only occurs if you have been exposed to the substance (allergen) before. Upon a repeat exposure, your body reacts to the substance. This type is less common. CAUSES  Many different substances can cause contact dermatitis. Irritant contact dermatitis is most commonly caused by exposure to:   Makeup.   Soaps.   Detergents.   Bleaches.   Acids.   Metal salts, such as nickel.  Allergic contact dermatitis is most commonly caused by exposure to:   Poisonous plants.   Chemicals.   Jewelry.   Latex.   Medicines.   Preservatives in products, such as clothing.  RISK FACTORS This condition is more likely to develop in:   People who have jobs that expose them to irritants or allergens.  People who have certain medical conditions, such as asthma or eczema.  SYMPTOMS  Symptoms of this condition may occur anywhere on your body where the irritant has touched you or is touched by you. Symptoms include:  Dryness or flaking.   Redness.   Cracks.   Itching.   Pain or a burning feeling.   Blisters.  Drainage of small amounts of blood or clear fluid from skin cracks. With allergic contact dermatitis, there may also be swelling in areas such as the eyelids, mouth, or genitals.  DIAGNOSIS  This condition is diagnosed with a medical history and physical exam. A patch skin test  may be performed to help determine the cause. If the condition is related to your job, you may need to see an occupational medicine specialist. TREATMENT Treatment for this condition includes figuring out what caused the reaction and protecting your skin from further contact. Treatment may also include:   Steroid creams or ointments. Oral steroid medicines may be needed in more severe cases.  Antibiotics or antibacterial ointments, if a skin infection is present.  Antihistamine lotion or an antihistamine taken by mouth to ease itching.  A bandage (dressing). HOME CARE INSTRUCTIONS Skin Care  Moisturize your skin as needed.   Apply cool compresses to the affected areas.  Try taking a bath with:  Epsom salts. Follow the instructions on the packaging. You can get these at your local pharmacy or grocery store.  Baking soda. Pour a small amount into the bath as directed by your health care provider.  Colloidal oatmeal. Follow the instructions on the packaging. You can get this at your local pharmacy or grocery store.  Try applying baking soda paste to your skin. Stir water into baking soda until it reaches a paste-like consistency.  Do not scratch your skin.  Bathe less frequently, such as every other day.  Bathe in lukewarm water. Avoid using hot water. Medicines  Take or apply over-the-counter and prescription medicines only as told by your health care provider.   If you were prescribed an antibiotic medicine, take or apply your antibiotic as told by your health care provider. Do not stop using the  antibiotic even if your condition starts to improve. General Instructions  Keep all follow-up visits as told by your health care provider. This is important.  Avoid the substance that caused your reaction. If you do not know what caused it, keep a journal to try to track what caused it. Write down:  What you eat.  What cosmetic products you use.  What you drink.  What  you wear in the affected area. This includes jewelry.  If you were given a dressing, take care of it as told by your health care provider. This includes when to change and remove it. SEEK MEDICAL CARE IF:   Your condition does not improve with treatment.  Your condition gets worse.  You have signs of infection such as swelling, tenderness, redness, soreness, or warmth in the affected area.  You have a fever.  You have new symptoms. SEEK IMMEDIATE MEDICAL CARE IF:   You have a severe headache, neck pain, or neck stiffness.  You vomit.  You feel very sleepy.  You notice red streaks coming from the affected area.  Your bone or joint underneath the affected area becomes painful after the skin has healed.  The affected area turns darker.  You have difficulty breathing.   This information is not intended to replace advice given to you by your health care provider. Make sure you discuss any questions you have with your health care provider.   Document Released: 07/09/2000 Document Revised: 04/02/2015 Document Reviewed: 11/27/2014 Elsevier Interactive Patient Education 2016 [place] steroid dose taper as directed. Continue daily Benadryl and dose Ranitidine as directed. Follow-up with your provider as needed.

## 2015-11-23 NOTE — Telephone Encounter (Signed)
-----   Message from Ellamae Sia sent at 11/19/2015  2:42 PM EDT ----- Regarding: Lab orders for Wednesday, 5.3.17 Patient is scheduled for CPX labs, please order future labs, Thanks , Karna Christmas

## 2015-11-23 NOTE — ED Notes (Signed)
Pt presents to ED with red rash to face. Pt reports itching. Pt does not appear to be in any distress. Pt speaking in complete sentences without difficulty. Pt states this morning her lips were swollen. Pt reports she feels like her throat is swelling. Pt denies difficulty breathing. Pt reports she thinks she was bitten by an insect two days ago on her face.

## 2015-11-25 ENCOUNTER — Other Ambulatory Visit: Payer: Medicare PPO

## 2015-11-26 ENCOUNTER — Other Ambulatory Visit: Payer: Medicare PPO

## 2015-12-01 ENCOUNTER — Telehealth: Payer: Self-pay

## 2015-12-01 NOTE — Telephone Encounter (Signed)
Pt notified of Dr. Tower's comments and verbalized understanding  

## 2015-12-01 NOTE — Telephone Encounter (Signed)
Pt has been taking steroids that were given to pt at [facility] ED on 12/23/15 for rash and swollen lips due to contact dermatitis; pt is still taking prednisone 3 tabs today; but pt is feeling shaky, weak and feels horrible.pt has not taken prednisone so far today. Pt wants to know if should continue taking the prednisone. Pt request cb. [facility] [address]. If pt condition changes or worsens prior to cb pt will call [facility].

## 2015-12-01 NOTE — Telephone Encounter (Signed)
Those are common side effects to prednisone (unfortunately) and it should start to improve as the dose is lowered  Keep me updated if symptoms worsen or do not improve

## 2015-12-02 ENCOUNTER — Encounter: Payer: Medicare PPO | Admitting: Family Medicine

## 2015-12-09 ENCOUNTER — Ambulatory Visit (INDEPENDENT_AMBULATORY_CARE_PROVIDER_SITE_OTHER): Payer: Medicare PPO | Admitting: Podiatry

## 2015-12-09 ENCOUNTER — Encounter: Payer: Self-pay | Admitting: Podiatry

## 2015-12-09 DIAGNOSIS — M2040 Other hammer toe(s) (acquired), unspecified foot: Secondary | ICD-10-CM

## 2015-12-09 DIAGNOSIS — L84 Corns and callosities: Secondary | ICD-10-CM

## 2015-12-09 NOTE — Patient Instructions (Signed)

## 2015-12-09 NOTE — Progress Notes (Signed)
Patient ID: Cheryl Joseph, female   DOB: Sep 20, 1940, 75 y.o.   MRN: [id number]  Subjective: 75 year old female presents the office they for concerns of right second digit hammertoe which is continued to have pain. She has tried multiple conservative treatment options including shoe gear modifications, offloading padding without any relief. He is also happy our debridements of the hyperkeratotic lesion on the dorsal aspect of the PIPJ. Despite all history was she continues to have pain and she presents to discuss surgical options. Denies any systemic complaints such as fevers, chills, nausea, vomiting. No acute changes since last appointment, and no other complaints at this time.   Objective: AAO x3, NAD DP/PT pulses palpable bilaterally, CRT less than 3 seconds Protective sensation intact with Simms Weinstein monofilament Semirigid hammertoe contracture right second toe with small hyperkeratotic lesion dorsal PIPJ with slight irritation present. There is adductovarus of the third, fourth, fifth digit on the right side however there is no tenderness on that toe. His only tenderness the second toe dorsally the PIPJ this time. Mild HAV is present. No areas of pinpoint bony tenderness or pain with vibratory sensation. MMT 5/5, ROM WNL. No edema, erythema, increase in warmth to bilateral lower extremities.  No open lesions or pre-ulcerative lesions.  No pain with calf compression, swelling, warmth, erythema  Assessment: Hammertoe right second toe symptomatic  Plan: -All treatment options discussed with the patient including all alternatives, risks, complications.  -Previous x-rays were reviewed and discussed the patient. I discussed both conservative and surgical treatment options with the patient. At this time I discussed with her PIPJ arthroplasty of the second toe. I'm concerned that if the toe is to straighten that he other toes will cause pressure and irritation and she does not want to the  other toes correct at this time. She says the other toes are not hurting her and she does not want have the surgery fix those toes. She wishes to proceed with surgery and she risks and palpitations. -The incision placement as well as the postoperative course was discussed with the patient. I discussed risks of the surgery which include, but not limited to, infection, bleeding, pain, swelling, need for further surgery, delayed or nonhealing, painful or ugly scar, numbness or sensation changes, over/under correction, recurrence, transfer lesions, further deformity, hardware failure, DVT/PE, loss of toe/foot. Patient understands these risks and wishes to proceed with surgery. The surgical consent was reviewed with the patient all 3 pages were signed. No promises or guarantees were given to the outcome of the procedure. All questions were answered to the best of my ability. Before the surgery the patient was encouraged to call the office if there is any further questions. The surgery will be performed at the [facility] on an outpatient basis.  [name], DPM

## 2015-12-12 ENCOUNTER — Telehealth: Payer: Self-pay | Admitting: *Deleted

## 2015-12-12 NOTE — Telephone Encounter (Signed)
"  I do need to talk to you about an appointment for surgery."

## 2015-12-16 NOTE — Telephone Encounter (Signed)
I'm returning your call.  How can I help you.  "When I was there, the nurse and I talked and discussed doing surgery on June 14.  Is that date available?"  Yes, it is available, I'll get you scheduled.  "What time will I need to be there?"  Someone will call you from the surgical center with the time a day or two prior to surgery date.  You can go ahead and register if you have access to a computer.  "I did it on Saturday and it took me hours and hours to do it.  Thank you so much for calling me back.

## 2015-12-18 ENCOUNTER — Ambulatory Visit (INDEPENDENT_AMBULATORY_CARE_PROVIDER_SITE_OTHER): Payer: Medicare PPO | Admitting: Family Medicine

## 2015-12-18 ENCOUNTER — Ambulatory Visit: Payer: Self-pay | Admitting: Family Medicine

## 2015-12-18 ENCOUNTER — Encounter: Payer: Self-pay | Admitting: Family Medicine

## 2015-12-18 ENCOUNTER — Telehealth: Payer: Self-pay | Admitting: Family Medicine

## 2015-12-18 VITALS — BP 124/86 | HR 123 | Temp 98.3°F | Wt 202.4 lb

## 2015-12-18 DIAGNOSIS — R1030 Lower abdominal pain, unspecified: Secondary | ICD-10-CM

## 2015-12-18 DIAGNOSIS — R5383 Other fatigue: Secondary | ICD-10-CM

## 2015-12-18 DIAGNOSIS — R6889 Other general symptoms and signs: Secondary | ICD-10-CM

## 2015-12-18 LAB — POCT URINALYSIS DIPSTICK
Glucose, UA: NEGATIVE
Ketones, UA: NEGATIVE
LEUKOCYTES UA: NEGATIVE
NITRITE UA: NEGATIVE
PH UA: 5.5
PROTEIN UA: 30
Spec Grav, UA: 1.025
UROBILINOGEN UA: 0.2

## 2015-12-18 NOTE — Telephone Encounter (Signed)
[facility] scheduled appt with Dr Lacinda Axon 12/18/15 at 4:15. I spoke with pt and scheduled 30 min appt 12/18/15 at 3:30 and cancelled 12/18/15 at 4:15 pm. Pt was appreciative and voiced understanding.

## 2015-12-18 NOTE — Telephone Encounter (Signed)
Patient Name: Cheryl Joseph  DOB: 1941/01/25    Initial Comment Caller states she is coming off of a steroid, has not had it in 11 days, she is nauseated, lower stomach and back pain. Has chills off and on.   Nurse Assessment  Nurse: Raphael Gibney, RN, Vanita Ingles Date/Time (Eastern Time): 12/18/2015 1:19:44 PM  Confirm and document reason for call. If symptomatic, describe symptoms. You must click the next button to save text entered. ---Caller states she is coming off steroids and has not had any steroids for 11 days. Her mind is clear. Has had back and lower abd pain all night. Has had chills off and on for a few days. BMs were normal. She has taken Gas X. Pain level 4-5 now and was constant during the night and is constant now. She is nauseated.  Has the patient traveled out of the country within the last 30 days? ---No  Does the patient have any new or worsening symptoms? ---Yes  Will a triage be completed? ---Yes  Related visit to physician within the last 2 weeks? ---No  Does the PT have any chronic conditions? (i.e. diabetes, asthma, etc.) ---No  Is this a behavioral health or substance abuse call? ---No     Guidelines    Guideline Title Affirmed Question Affirmed Notes  Abdominal Pain - Female [1] MILD-MODERATE pain AND [2] constant AND [3] present > 2 hours    Final Disposition User   See Physician within 4 Hours (or PCP triage) Raphael Gibney, RN, [place]    Comments  No appts available at [facility]. Appt scheduled for Dr. Thersa Salt at [facility] at [place] pm on 12/18/2015.   Referrals  REFERRED TO PCP OFFICE   Disagree/Comply: Comply

## 2015-12-18 NOTE — Progress Notes (Signed)
Pre visit review using our clinic review tool, if applicable. No additional management support is needed unless otherwise documented below in the visit note. 

## 2015-12-18 NOTE — Patient Instructions (Signed)
We will call your lab results.  Be sure to stay hydrated.  Please follow-up closely with her primary care physician.  Take care  Dr. Lacinda Axon

## 2015-12-19 DIAGNOSIS — R6889 Other general symptoms and signs: Secondary | ICD-10-CM | POA: Insufficient documentation

## 2015-12-19 LAB — COMPREHENSIVE METABOLIC PANEL
ALBUMIN: 3.6 g/dL (ref 3.5–5.2)
ALT: 20 U/L (ref 0–35)
AST: 19 U/L (ref 0–37)
Alkaline Phosphatase: 80 U/L (ref 39–117)
BUN: 11 mg/dL (ref 6–23)
CHLORIDE: 103 meq/L (ref 96–112)
CO2: 27 meq/L (ref 19–32)
Calcium: 9.1 mg/dL (ref 8.4–10.5)
Creatinine, Ser: 0.81 mg/dL (ref 0.40–1.20)
GFR: 73.08 mL/min (ref >60.00)
GLUCOSE: 163 mg/dL — AB (ref 70–99)
Potassium: 4.4 mEq/L (ref 3.5–5.1)
SODIUM: 138 meq/L (ref 135–145)
Total Bilirubin: 0.7 mg/dL (ref 0.2–1.2)
Total Protein: 6.5 g/dL (ref 6.0–8.3)

## 2015-12-19 LAB — LIPASE: Lipase: 13 U/L (ref 11.0–59.0)

## 2015-12-19 LAB — CBC
HCT: 44.3 % (ref 36.0–46.0)
Hemoglobin: 14.8 g/dL (ref 12.0–15.0)
MCHC: 33.5 g/dL (ref 30.0–36.0)
MCV: 92.4 fl (ref 78.0–100.0)
Platelets: 257 10*3/uL (ref 150.0–400.0)
RBC: 4.79 Mil/uL (ref 3.87–5.11)
RDW: 13.8 % (ref 11.5–15.5)
WBC: 7.8 10*3/uL (ref 4.0–10.5)

## 2015-12-19 LAB — HEMOGLOBIN A1C: Hgb A1c MFr Bld: 7.3 % — ABNORMAL HIGH (ref 4.6–6.5)

## 2015-12-19 LAB — TSH: TSH: 1.39 u[IU]/mL (ref 0.35–4.50)

## 2015-12-19 NOTE — Assessment & Plan Note (Signed)
Patient presenting today with numerous complaints. Exam unrevealing.  Unclear etiology of her symptoms.  Starting work up with labs today.

## 2015-12-19 NOTE — Progress Notes (Signed)
Subjective:  Patient ID: Cheryl Joseph, female    DOB: 12/24/1940  Age: 75 y.o. MRN: [id number]  CC: Not feeling well  HPI:  75 year old female with a past medical history of rheumatoid arthritis presents with the above complaint.  Patient states that she has not felt well for nearly a month. She states that it started after a course of steroids. She has now been off the steroids for 11 days but still does not feel well. She has numerous complaints today: lower abdominal pain, back pain, decreased appetite, nausea, chills, fatigue, excessive sleepiness. She just "doesn't feel good". She states that she's had lower abdominal pain. No changes in her bowel movements. Back pain as well. Generalized malaise and fatigue. No fevers or chills. No new medication changes. No known exacerbating factors. Relieving factors. She states that she is concerned that something is wrong.  Social Hx   Social History   Social History  . Marital Status: Married    Spouse Name: N/A  . Number of Children: N/A  . Years of Education: N/A   Social History Main Topics  . Smoking status: Never Smoker   . Smokeless tobacco: None  . Alcohol Use: No  . Drug Use: No  . Sexual Activity: Not Asked   Other Topics Concern  . None   Social History Narrative   Review of Systems  Constitutional: Positive for chills, appetite change and fatigue. Negative for fever.  Gastrointestinal: Positive for nausea and abdominal pain. Negative for vomiting.       Abdominal pain after intercourse.  Musculoskeletal: Positive for back pain.   Objective:  BP 124/86 mmHg  Pulse 123  Temp(Src) 98.3 F (36.8 C) (Oral)  Wt 202 lb 6 oz (91.797 kg)  SpO2 95%  BP/Weight 12/18/2015 6/37/8588 5/0/2774  Systolic BP 128 786 767  Diastolic BP 86 68 72  Wt. (Lbs) 202.38 190 -  BMI 33.68 31.62 -   Physical Exam  Constitutional: She is oriented to person, place, and time. She appears well-developed. No distress.  HENT:    Mouth/Throat: Oropharynx is clear and moist.  Eyes: Conjunctivae are normal. No scleral icterus.  Cardiovascular: Regular rhythm.  Tachycardia present.   Pulmonary/Chest: Effort normal. She has no wheezes. She has no rales.  Abdominal: Soft. She exhibits no distension and no mass. There is no rebound and no guarding.  Mild right lower quadrant tenderness initially. After talking with the patient/distracting the patient during exam there appeared to be no tenderness.  Neurological: She is alert and oriented to person, place, and time.  Psychiatric: She has a normal mood and affect.  Vitals reviewed.  Lab Results  Component Value Date   WBC 6.8 08/30/2014   HGB 15.2 08/30/2014   HCT 45.6 08/30/2014   PLT 229 08/30/2014   GLUCOSE 136* 08/30/2014   CHOL 181 08/14/2014   TRIG 176.0* 08/14/2014   HDL 44.00 08/14/2014   LDLDIRECT 105.9 12/26/2012   LDLCALC 102* 08/14/2014   ALT 21 08/30/2014   AST 26 08/30/2014   NA 141 08/30/2014   K 4.1 08/30/2014   CL 107 08/30/2014   CREATININE 1.01 08/30/2014   BUN 16 08/30/2014   CO2 28 08/30/2014   TSH 2.47 08/14/2014   HGBA1C 6.1 08/14/2014    Assessment & Plan:   Problem List Items Addressed This Visit    Multiple complaints - Primary    Patient presenting today with numerous complaints. Exam unrevealing.  Unclear etiology of her symptoms.  Starting work  up with labs today.        Other Visit Diagnoses    Other fatigue        Relevant Orders    CBC    Comp Met (CMET)    HgB A1c    TSH    Lower abdominal pain        Relevant Orders    POCT Urinalysis Dipstick (Completed)    Urine culture    Lipase      Follow-up: ASAP with PCP, pending lab results.  [place]

## 2015-12-20 LAB — URINE CULTURE
COLONY COUNT: NO GROWTH
ORGANISM ID, BACTERIA: NO GROWTH

## 2015-12-23 ENCOUNTER — Encounter: Payer: Self-pay | Admitting: Family Medicine

## 2015-12-23 ENCOUNTER — Ambulatory Visit (INDEPENDENT_AMBULATORY_CARE_PROVIDER_SITE_OTHER): Payer: Medicare PPO | Admitting: Family Medicine

## 2015-12-23 VITALS — BP 118/64 | HR 79 | Temp 98.4°F | Ht 63.0 in | Wt 203.8 lb

## 2015-12-23 DIAGNOSIS — E669 Obesity, unspecified: Secondary | ICD-10-CM

## 2015-12-23 DIAGNOSIS — E119 Type 2 diabetes mellitus without complications: Secondary | ICD-10-CM | POA: Diagnosis not present

## 2015-12-23 MED ORDER — MONTELUKAST SODIUM 10 MG PO TABS: 10.0000 mg | ORAL_TABLET | Freq: Every day | ORAL | Status: DC

## 2015-12-23 MED ORDER — METFORMIN HCL ER 500 MG PO TB24: 500.0000 mg | ORAL_TABLET | Freq: Every day | ORAL | Status: DC

## 2015-12-23 MED ORDER — SIMVASTATIN 40 MG PO TABS: 40.0000 mg | ORAL_TABLET | Freq: Every day | ORAL | Status: DC

## 2015-12-23 NOTE — Progress Notes (Signed)
Pre visit review using our clinic review tool, if applicable. No additional management support is needed unless otherwise documented below in the visit note. 

## 2015-12-23 NOTE — Patient Instructions (Signed)
Start working on a low glycemic diet  Start looking for chair exercise videos - on line or on DVD Stop at check out for referral to diabetic teaching  Start metformin 500 mg XR once daily - if any intolerable side effects let me know

## 2015-12-23 NOTE — Progress Notes (Signed)
Subjective:    Patient ID: Cheryl Joseph, female    DOB: [birth year], 75 y.o.   MRN: [id number]  HPI Here for elevated A1C  Pt saw Dr Lacinda Axon last week for multiple c/o incl fatigue and malaise  Lab Results  Component Value Date   HGBA1C 7.3* 12/18/2015   This is up from 6.1  Significant increase  Wt is up 13 lb since April BMI is 98- obese range Habits  Has fought weight (all of her adult life)  Has never been on metformin  Has always tended towards diarrhea-has concerned about that     Had a bad 5 wk in general  Bug bite Then had poison oak  Was on oral steroids for a while (did not tolerate well) -made her hungry and gain weight  Generally shaky and weak  Also did not exercise for 5 weeks   Supposed to have toe surgery soon- hammer toe    Patient Active Problem List   Diagnosis Date Noted  . Multiple complaints 12/19/2015  . Routine general medical examination at a health care facility 11/23/2015  . Hammertoe 10/28/2015  . Corns and callosities 10/28/2015  . Frequent loose stools 02/14/2015  . Diverticulosis of colon without hemorrhage 02/14/2015  . Colon cancer screening 08/19/2014  . Labile blood pressure 02/25/2014  . Encounter for Medicare annual wellness exam 07/16/2013  . Diabetes type 2, controlled ([place]) 05/14/2009  . RHEUMATOID ARTHRITIS 05/13/2008  . ELEVATED BP W/O HYPERTENSION 05/13/2008  . Osteopenia 03/07/2007  . Hyperlipidemia 01/03/2007  . Obesity 01/03/2007  . ALLERGIC RHINITIS, SEASONAL 01/03/2007  . ASTHMA 01/03/2007  . OVERACTIVE BLADDER 01/03/2007   Past Medical History  Diagnosis Date  . HPV in female 1996    HPV with colposcopy (all neg paps since)  . Hyperlipidemia   . Asthma   . Osteopenia   . Obesity   . Arthritis     RA hands(seronegative)  . Back pain   . Shoulder pain   . Anginal pain ([place])   . GERD (gastroesophageal reflux disease)   . Shortness of breath dyspnea    Past Surgical History  Procedure Laterality  Date  . Tonsillectomy    . Eye surgery    . Colonoscopy with propofol N/A 05/01/2015    Procedure: COLONOSCOPY WITH PROPOFOL;  Surgeon: Manya Silvas, MD;  [facility] ENDOSCOPY;  Service: Endoscopy;  Laterality: N/A;  . Vaginal hysterectomy  2015    with pelvic organ prolapse surgery   . Pelvic organ prolapse surgery   2015   Social History  Substance Use Topics  . Smoking status: Never Smoker   . Smokeless tobacco: None  . Alcohol Use: No   Family History  Problem Relation Age of Onset  . Diabetes Mother   . Hyperlipidemia Mother   . Heart disease Mother   . Hypertension Mother   . Obesity Mother   . Kyphosis Mother   . Heart disease Father   . Hypertension Father   . Cancer Father     lung Cancer smoker  . Kyphosis Sister   . Diabetes Brother    Allergies  Allergen Reactions  . Ezetimibe-Simvastatin     REACTION: leg pain  . Tolterodine Tartrate     REACTION: side effects  . Vesicare [Solifenacin Succinate]     Eye problems    Current Outpatient Prescriptions on File Prior to Visit  Medication Sig Dispense Refill  . albuterol (PROVENTIL HFA;VENTOLIN HFA) 108 (90 BASE) MCG/ACT inhaler Inhale  2 puffs into the lungs every 4 (four) hours as needed for wheezing (and 2 puffs before exercise or exposure to cold air). 1 Inhaler 11  . aspirin-sod bicarb-citric acid (ALKA-SELTZER) 325 MG TBEF tablet Take 325 mg by mouth every 6 (six) hours as needed. Reported on 12/18/2015    . b complex vitamins tablet Take 1 tablet by mouth daily. Reported on 12/18/2015    . Calcium Carbonate-Vitamin D (CALCIUM-VITAMIN D3 PO) Take 1 capsule by mouth daily. Reported on 12/18/2015    . glucosamine-chondroitin 500-400 MG tablet Take 1 tablet by mouth daily. Reported on 12/18/2015    . guaiFENesin (MUCINEX) 600 MG 12 hr tablet Take by mouth 2 (two) times daily. Reported on 12/18/2015    . Hyaluronic Acid-Vitamin C (HYALURONIC ACID PO) Take 1 tablet by mouth daily. Reported on 12/18/2015    .  Multiple Vitamin (MULTIVITAMIN) capsule Take 1 capsule by mouth daily. Reported on 12/18/2015    . NON FORMULARY Take 1 capsule by mouth 2 (two) times daily. Reported on 12/18/2015    . Omega-3 Fatty Acids (FISH OIL PO) Take 2 capsules by mouth daily. Reported on 12/18/2015    . oxybutynin (OXYTROL) 3.9 MG/24HR Place 1 patch onto the skin every 3 (three) days. 8 patch 12  . ranitidine (ZANTAC) 150 MG tablet Take 1 tablet (150 mg total) by mouth 2 (two) times daily. 20 tablet 0  . Ubiquinol 100 MG CAPS Take 1 capsule by mouth 3 (three) times a week. Reported on 12/18/2015     No current facility-administered medications on file prior to visit.    Review of Systems    Review of Systems  Constitutional: Negative for fever, appetite change,  and unexpected weight change. pos for generalized fatigue and malaise, pos for wt gain from inc calorie intake  Eyes: Negative for pain and visual disturbance.  Respiratory: Negative for cough and shortness of breath.   Cardiovascular: Negative for cp or palpitations    Gastrointestinal: Negative for nausea, diarrhea and constipation.  Genitourinary: Negative for urgency and frequency.  Skin: Negative for pallor or rash   Neurological: Negative for weakness, light-headedness, numbness and headaches.  Hematological: Negative for adenopathy. Does not bruise/bleed easily.  Psychiatric/Behavioral: Negative for dysphoric mood. The patient is not nervous/anxious.      Objective:   Physical Exam  Constitutional: She appears well-developed and well-nourished. No distress.  obese and well appearing   HENT:  Head: Normocephalic and atraumatic.  Mouth/Throat: Oropharynx is clear and moist.  Eyes: Conjunctivae and EOM are normal. Pupils are equal, round, and reactive to light.  Neck: Normal range of motion. Neck supple. No JVD present. Carotid bruit is not present. No thyromegaly present.  Cardiovascular: Normal rate, regular rhythm, normal heart sounds and intact  distal pulses.  Exam reveals no gallop.   Pulmonary/Chest: Effort normal and breath sounds normal. No respiratory distress. She has no wheezes. She has no rales.  No crackles  Abdominal: Soft. Bowel sounds are normal. She exhibits no distension, no abdominal bruit and no mass. There is no tenderness.  Musculoskeletal: She exhibits no edema.  Lymphadenopathy:    She has no cervical adenopathy.  Neurological: She is alert. She has normal reflexes.  Skin: Skin is warm and dry. No rash noted.  Psychiatric: She has a normal mood and affect.          Assessment & Plan:   Problem List Items Addressed This Visit      Endocrine   Diabetes type  2, controlled ([place]) - Primary    New diabetes 2 Lab Results  Component Value Date   HGBA1C 7.3* 12/18/2015   This is no doubt in part due to recent steroids but she also has recent inc in wt/ dec in exercise and a family hx  Disc concerns and reasons for tx  Has upcoming foot surgery as well  Start metformin XR 500 once daily Ref to DM teaching  F/u 3 mo with lab prior  Given handouts on general care of DM and diet as well today  >25 minutes spent in face to face time with patient, >50% spent in counselling or coordination of care        Relevant Medications   metFORMIN (GLUCOPHAGE-XR) 500 MG 24 hr tablet   simvastatin (ZOCOR) 40 MG tablet   Other Relevant Orders   Ambulatory referral to diabetic education     Other   Obesity    Discussed how this problem influences overall health and the risks it imposes  Reviewed plan for weight loss with lower calorie diet (via better food choices and also portion control or program like weight watchers) and exercise building up to or more than 30 minutes 5 days per week including some aerobic activity   BMI is 36 Disc goals  Will have to do chair exercise when recovering from foot surgery - given info       Relevant Medications   metFORMIN (GLUCOPHAGE-XR) 500 MG 24 hr tablet

## 2015-12-23 NOTE — Assessment & Plan Note (Signed)
New diabetes 2 Lab Results  Component Value Date   HGBA1C 7.3* 12/18/2015   This is no doubt in part due to recent steroids but she also has recent inc in wt/ dec in exercise and a family hx  Disc concerns and reasons for tx  Has upcoming foot surgery as well  Start metformin XR 500 once daily Ref to DM teaching  F/u 3 mo with lab prior  Given handouts on general care of DM and diet as well today  >25 minutes spent in face to face time with patient, >50% spent in counselling or coordination of care

## 2015-12-23 NOTE — Assessment & Plan Note (Signed)
Discussed how this problem influences overall health and the risks it imposes  Reviewed plan for weight loss with lower calorie diet (via better food choices and also portion control or program like weight watchers) and exercise building up to or more than 30 minutes 5 days per week including some aerobic activity   BMI is 36 Disc goals  Will have to do chair exercise when recovering from foot surgery - given info

## 2016-01-05 ENCOUNTER — Encounter: Payer: Medicare PPO | Attending: Family Medicine | Admitting: *Deleted

## 2016-01-05 ENCOUNTER — Encounter: Payer: Self-pay | Admitting: *Deleted

## 2016-01-05 VITALS — BP 114/68 | Ht 65.0 in | Wt 201.8 lb

## 2016-01-05 DIAGNOSIS — E119 Type 2 diabetes mellitus without complications: Secondary | ICD-10-CM | POA: Diagnosis not present

## 2016-01-05 NOTE — Progress Notes (Signed)
Diabetes Self-Management Education  Visit Type: First/Initial  Appt. Start Time: 1325 Appt. End Time: [id number]  01/05/2016  [name], identified by name and date of birth, is a 75 y.o. female with a diagnosis of Diabetes: Type 2.   ASSESSMENT  Blood pressure 114/68, height 5\' 5"  (1.651 m), weight 201 lb 12.8 oz (91.536 kg). Body mass index is 33.58 kg/(m^2).      Diabetes Self-Management Education - 01/05/16 1530    Visit Information   Visit Type First/Initial   Initial Visit   Diabetes Type Type 2   Are you currently following a meal plan? No   Are you taking your medications as prescribed? Yes   Date Diagnosed 2 weeks ago   Health Coping   How would you rate your overall health? Good   Psychosocial Assessment   Patient Belief/Attitude about Diabetes Other (comment)  "sad, challenged"   Self-care barriers None   Self-management support Doctor's office;Family;Friends;Church   Patient Concerns Nutrition/Meal planning;Problem Solving;Weight Control;Healthy Lifestyle   Special Needs None   Preferred Learning Style Visual   Learning Readiness Change in progress   How often do you need to have someone help you when you read instructions, pamphlets, or other written materials from your doctor or pharmacy? 1 - Never   What is the last grade level you completed in school? 12th   Pre-Education Assessment   Patient understands the diabetes disease and treatment process. Needs Instruction   Patient understands incorporating nutritional management into lifestyle. Needs Instruction   Patient undertands incorporating physical activity into lifestyle. Needs Instruction   Patient understands using medications safely. Needs Instruction   Patient understands monitoring blood glucose, interpreting and using results Needs Instruction   Patient understands prevention, detection, and treatment of acute complications. Needs Instruction   Patient understands prevention, detection, and  treatment of chronic complications. Needs Instruction   Patient understands how to develop strategies to address psychosocial issues. Needs Instruction   Patient understands how to develop strategies to promote health/change behavior. Needs Instruction   Complications   Last HgB A1C per patient/outside source 7.3 %  12/18/15   How often do you check your blood sugar? 0 times/day (not testing)  Provided One Touch Verio Flex meter and instructed on use. BG upon return demonstration was 136 mg/dL at  2:25 pm - 2 hrs pp.    Have you had a dilated eye exam in the past 12 months? Yes   Have you had a dental exam in the past 12 months? Yes   Are you checking your feet? No   Dietary Intake   Breakfast egg with canadian bacon and wheat toast; cheese toast; oatmeal   Lunch salad with chicken   Snack (afternoon) string cheese; [place] yogurt   Dinner grilled chicken salad with few fries; beans and greens; 1 taco   Snack (evening) prune or apricot and small dark chocolate   Beverage(s) water, unsweetened tea, diet soda   Exercise   Exercise Type ADL's  no exercise in last 6 weeks due to feeling bac   Patient Education   Previous Diabetes Education No   Disease state  Definition of diabetes, type 1 and 2, and the diagnosis of diabetes;Factors that contribute to the development of diabetes   Nutrition management  Role of diet in the treatment of diabetes and the relationship between the three main macronutrients and blood glucose level;Carbohydrate counting   Physical activity and exercise  Role of exercise on diabetes management, blood pressure control  and cardiac health.   Medications Reviewed patients medication for diabetes, action, purpose, timing of dose and side effects.   Monitoring Taught/evaluated SMBG meter.;Purpose and frequency of SMBG.;Identified appropriate SMBG and/or A1C goals.   Chronic complications Relationship between chronic complications and blood glucose control   Psychosocial  adjustment Identified and addressed patients feelings and concerns about diabetes   Individualized Goals (developed by patient)   Reducing Risk Improve blood sugars Prevent diabetes complications Lose weight Lead a healthier lifestyle   Outcomes   Expected Outcomes Demonstrated interest in learning. Expect positive outcomes   Future DMSE 4-6 wks      Individualized Plan for Diabetes Self-Management Training:   Learning Objective:  Patient will have a greater understanding of diabetes self-management. Patient education plan is to attend individual and/or group sessions per assessed needs and concerns.   Plan:   Patient Instructions  Check blood sugars 2 x day before breakfast and 2 hrs after supper  3-4 x week Exercise: Continue walking/stationary bike for  15 minutes   5  days a week once cleared from surgery Eat 3 meals day,   2  snacks a day Space meals 4-6 hours apart Bring blood sugar records to the next class Call your doctor for a prescription for:  1. Meter strips (type) One Touch Verio checking 3-4  times per week  2. Lancets (type) One Touch Delica checking 3-4  times per week   Expected Outcomes:  Demonstrated interest in learning. Expect positive outcomes  Education material provided:  General Meal Planning Guidelines Simple Meal Plan Meter - One Touch Verio Flex  If problems or questions, patient to contact team via:  Johny Drilling, RN, CCM, CDE [phone]  Future DSME appointment: 4-6 wks  Pt to have surgery on her toe this week. She will return for classes beginning February 05, 2016.

## 2016-01-05 NOTE — Patient Instructions (Addendum)
Check blood sugars 2 x day before breakfast and 2 hrs after supper  3-4 x week Exercise: Continue walking/stationary bike for  15 minutes   5  days a week once cleared from surgery Eat 3 meals day,   2  snacks a day Space meals 4-6 hours apart Bring blood sugar records to the next class Call your doctor for a prescription for:  1. Meter strips (type) One Touch Verio checking 3-4  times per week  2. Lancets (type) One Touch Delica checking 3-4  times per week

## 2016-01-07 ENCOUNTER — Encounter: Payer: Self-pay | Admitting: *Deleted

## 2016-01-07 ENCOUNTER — Telehealth: Payer: Self-pay | Admitting: *Deleted

## 2016-01-07 DIAGNOSIS — E7800 Pure hypercholesterolemia, unspecified: Secondary | ICD-10-CM | POA: Diagnosis not present

## 2016-01-07 DIAGNOSIS — M2041 Other hammer toe(s) (acquired), right foot: Secondary | ICD-10-CM

## 2016-01-07 NOTE — Progress Notes (Signed)
DOS 01/07/2016 Right foot hammer toe repair of 2nd toe.

## 2016-01-07 NOTE — Telephone Encounter (Addendum)
Pharmacist states Vicodin is no long in 5/325mg , can order 5/300mg .  Dr. Jacqualyn Posey changed Vicodin to 5/300mg . 01/09/2016-post op courtesy call - Pt states she is doing fine, hasn't had any pain medication since yesterday.  I told pt is sounded like she was following instructions and I reiterated no weight bearing or dangling the surgical foot over 15 mins/hour due to the increase of swelling, inflammation and pain, to leave the original surgical dressing in place clean and dry, stay in the boot to walk and if possible sleep, and to call with concerns.  Pt states understanding.

## 2016-01-09 ENCOUNTER — Telehealth: Payer: Self-pay

## 2016-01-09 NOTE — Telephone Encounter (Signed)
Pt has spoken with [facility] about what diabetic meter and supplies will be covered by ins co. Pt does not have answer yet and will cb with further info. Pt is not sure what day she will cb.

## 2016-01-13 NOTE — Telephone Encounter (Signed)
Done and in IN box 

## 2016-01-13 NOTE — Telephone Encounter (Signed)
Form faxed

## 2016-01-13 NOTE — Telephone Encounter (Signed)
Received fax with meter, and supplies, form placed in your inbox because i'm not sure the directions/amount you want pt to check her BS

## 2016-01-15 ENCOUNTER — Ambulatory Visit (INDEPENDENT_AMBULATORY_CARE_PROVIDER_SITE_OTHER): Payer: Medicare PPO | Admitting: Podiatry

## 2016-01-15 ENCOUNTER — Encounter: Payer: Self-pay | Admitting: Podiatry

## 2016-01-15 ENCOUNTER — Ambulatory Visit (INDEPENDENT_AMBULATORY_CARE_PROVIDER_SITE_OTHER): Payer: Medicare PPO

## 2016-01-15 DIAGNOSIS — Z09 Encounter for follow-up examination after completed treatment for conditions other than malignant neoplasm: Secondary | ICD-10-CM

## 2016-01-15 DIAGNOSIS — M79673 Pain in unspecified foot: Secondary | ICD-10-CM

## 2016-01-15 DIAGNOSIS — M2040 Other hammer toe(s) (acquired), unspecified foot: Secondary | ICD-10-CM

## 2016-01-16 ENCOUNTER — Encounter: Payer: Self-pay | Admitting: Podiatry

## 2016-01-16 NOTE — Progress Notes (Signed)
Patient ID: Cheryl Joseph, female   DOB: 1941-07-20, 75 y.o.   MRN: [id number]  Subjective: Cheryl Joseph is a 75 y.o. is seen today in office s/p right 2nd digit hammertoe repair (arthroplasty) preformed on 1 week ago. They state their pain is minimal and not taking pain medication at this time. Denies any systemic complaints such as fevers, chills, nausea, vomiting. No calf pain, chest pain, shortness of breath.   Objective: General: No acute distress, AAOx3  DP/PT pulses palpable 2/4, CRT < 3 sec to all digits.  Protective sensation intact. Motor function intact.  Right foot: Incision is well coapted without any evidence of dehiscence with sutures intact. There is no surrounding erythema, ascending cellulitis, fluctuance, crepitus, malodor, drainage/purulence. There is mild edema around the surgical site. There is no pain along the surgical site. The toe is noted to have some contracture today to it and reoccurrence. However, the toe does appear to be better than it was previously.  No other areas of tenderness to bilateral lower extremities.  No other open lesions or pre-ulcerative lesions.  No pain with calf compression, swelling, warmth, erythema.   Assessment and Plan:  Status post right 2nd hammertoe reapir, doing well with no complications   -Treatment options discussed including all alternatives, risks, and complications -X-rays were obtained and reviewed with the patient. S/p arthroplasty of the 2nd digit.  -Discussed the reoccurence of the contracture some compared to what it was during surgery. Dispensed a splint to help hold the toe in a corrected position.  -Antibitoic ointment applied followed by DSD. Keep clean, dry, intact.  -Darco shoe at all times.  -Ice/elevation -Pain medication as needed. -Monitor for any clinical signs or symptoms of infection and DVT/PE and directed to call the office immediately should any occur or go to the ER. -Follow-up in 1 week for  possible suture removal or sooner if any problems arise. In the meantime, encouraged to call the office with any questions, concerns, change in symptoms.   Cheryl Joseph, DPM

## 2016-01-22 ENCOUNTER — Ambulatory Visit (INDEPENDENT_AMBULATORY_CARE_PROVIDER_SITE_OTHER): Payer: Medicare PPO | Admitting: Podiatry

## 2016-01-22 ENCOUNTER — Encounter: Payer: Self-pay | Admitting: Podiatry

## 2016-01-22 DIAGNOSIS — Z09 Encounter for follow-up examination after completed treatment for conditions other than malignant neoplasm: Secondary | ICD-10-CM

## 2016-01-22 DIAGNOSIS — M2040 Other hammer toe(s) (acquired), unspecified foot: Secondary | ICD-10-CM

## 2016-01-23 NOTE — Progress Notes (Signed)
Patient ID: Cheryl Joseph, female   DOB: [birth year], 75 y.o.   MRN: [id number]  Subjective: Cheryl Joseph is a 75 y.o. is seen today in office s/p right 2nd digit hammertoe repair (arthroplasty) preformed 2 weeks ago. They state their pain is minimal and not taking pain medication at this time. She has continued with the surgical shoe. Denies any systemic complaints such as fevers, chills, nausea, vomiting. No calf pain, chest pain, shortness of breath.   Objective: General: No acute distress, AAOx3  DP/PT pulses palpable 2/4, CRT < 3 sec to all digits.  Protective sensation intact. Motor function intact.  Right foot: Incision is well coapted without any evidence of dehiscence with sutures intact. There is no surrounding erythema, ascending cellulitis, fluctuance, crepitus, malodor, drainage/purulence. There is mild edema around the surgical site but improved. There is no pain along the surgical site. The toe does sit in a mild reoccurant hammertoe position, but is improved to prior to surgery. No other areas of tenderness to bilateral lower extremities.  No other open lesions or pre-ulcerative lesions.  No pain with calf compression, swelling, warmth, erythema.   Assessment and Plan:  Status post right 2nd hammertoe reapir, doing well with no complications   -Treatment options discussed including all alternatives, risks, and complications -Sutures removed today. Antibiotic ointment applied followed by a DSD.  -Continue with surgical shoe. -She continue antibiotic ointment dressing changes daily. -Follow-up in 2 weeks or sooner if any issues are to arise. At that point will likely transition to regular shoe.  [name], DPM

## 2016-01-23 NOTE — Telephone Encounter (Signed)
Cheryl Joseph with [facility] left v/m requesting status of diabetic meter and diabetic supplies. Do not see form under media tab.Please advise.

## 2016-01-26 NOTE — Telephone Encounter (Signed)
Pt request status of diabetic supply order; advised refaxed today; pt voiced understanding.

## 2016-01-26 NOTE — Telephone Encounter (Signed)
Forms located on Shapale's desk and re-faxed to [facility].

## 2016-02-04 ENCOUNTER — Telehealth: Payer: Self-pay

## 2016-02-04 NOTE — Telephone Encounter (Signed)
Pt lmovm requesting DM supplies sent to [employer]---but she states [employer] was supposed to send request to Dr Glori Bickers, I do not see [employer] mentioned in chart or request---requesting to be sent to [facility] instead---will need to call pt to get more information on particular supplies she is referring to

## 2016-02-04 NOTE — Telephone Encounter (Signed)
Spoke with [facility] and gave them the verbal refills over the phone because pt said it's free through her mail order and wanted to have it filled through them and no [facility], pt aware too

## 2016-02-04 NOTE — Telephone Encounter (Signed)
Dr. Glori Bickers filled out a form (I still have it) that we have faxed over to [facility] 4 different times and I have the confirmation letter saying it went through, call pt but she wasn't there so left message to have her call us back, I can sent supplies to [facility] if she needs me to, but I will wait until she calls me back

## 2016-02-05 ENCOUNTER — Ambulatory Visit (INDEPENDENT_AMBULATORY_CARE_PROVIDER_SITE_OTHER): Payer: Medicare PPO

## 2016-02-05 ENCOUNTER — Ambulatory Visit (INDEPENDENT_AMBULATORY_CARE_PROVIDER_SITE_OTHER): Payer: Medicare PPO | Admitting: Podiatry

## 2016-02-05 ENCOUNTER — Encounter: Payer: Medicare PPO | Attending: Family Medicine | Admitting: Dietician

## 2016-02-05 ENCOUNTER — Encounter: Payer: Self-pay | Admitting: Dietician

## 2016-02-05 ENCOUNTER — Encounter: Payer: Self-pay | Admitting: Podiatry

## 2016-02-05 VITALS — Ht 65.0 in | Wt 200.9 lb

## 2016-02-05 DIAGNOSIS — M2040 Other hammer toe(s) (acquired), unspecified foot: Secondary | ICD-10-CM

## 2016-02-05 DIAGNOSIS — E119 Type 2 diabetes mellitus without complications: Secondary | ICD-10-CM | POA: Insufficient documentation

## 2016-02-05 DIAGNOSIS — Z09 Encounter for follow-up examination after completed treatment for conditions other than malignant neoplasm: Secondary | ICD-10-CM

## 2016-02-05 NOTE — Progress Notes (Signed)

## 2016-02-08 NOTE — Progress Notes (Signed)
Patient ID: Cheryl Joseph, female   DOB: July 16, 1941, 75 y.o.   MRN: [id number]  Subjective: Cheryl Joseph is a 75 y.o. is seen today in office s/p right 2nd digit hammertoe repair (arthroplasty) preformed 4 weeks ago. They state their pain is minimal and not taking pain medication at this time. She has been more active recently and on her feet more and been doing well. She has continued with the surgical shoe. Denies any systemic complaints such as fevers, chills, nausea, vomiting. No calf pain, chest pain, shortness of breath.   Objective: General: No acute distress, AAOx3  DP/PT pulses palpable 2/4, CRT < 3 sec to all digits.  Protective sensation intact. Motor function intact.  Right foot: Incision is well coapted without any evidence of dehiscence with sutures intact. There is no surrounding erythema, ascending cellulitis, fluctuance, crepitus, malodor, drainage/purulence. There is mild edema around the surgical site but improved. There is no pain along the surgical site. The toe has had recurrence of the hammertoe contracture however does still appear to be somewhat improved compared to what it was prior to surgery. No other areas of tenderness to bilateral lower extremities.  No other open lesions or pre-ulcerative lesions.  No pain with calf compression, swelling, warmth, erythema.   Assessment and Plan:  Status post right 2nd hammertoe reapir, doing well with no complications   -Treatment options discussed including all alternatives, risks, and complications -X-rays obtained and reviewed the patient. The does appear to be recurrence of the hammertoe and the joint does appear to be slightly off. The toe is straight however and a hammer physician. -Discussed with her that if she is has a recurrence of pain will need a PIPJ arthrodesis however given the contracture rather digits this may cause other issues as well. If she does not get the recurrent one of the pain then I would hold off  any further surgery. -I showed her how to tape the toes to help with swelling -She inserted transition to regular shoe as tolerated. -Continue ice and elevation -Follow-up in 4 weeks or sooner if any issues are to arise.   Cheryl Joseph, DPM

## 2016-02-12 ENCOUNTER — Encounter: Payer: Self-pay | Admitting: *Deleted

## 2016-02-12 ENCOUNTER — Encounter: Payer: Medicare PPO | Admitting: *Deleted

## 2016-02-12 VITALS — Wt 201.4 lb

## 2016-02-12 DIAGNOSIS — E119 Type 2 diabetes mellitus without complications: Secondary | ICD-10-CM | POA: Diagnosis not present

## 2016-02-12 NOTE — Progress Notes (Signed)

## 2016-02-19 ENCOUNTER — Encounter: Payer: Self-pay | Admitting: Dietician

## 2016-02-19 ENCOUNTER — Encounter: Payer: Medicare PPO | Admitting: Dietician

## 2016-02-19 ENCOUNTER — Encounter: Payer: Self-pay | Admitting: *Deleted

## 2016-02-19 VITALS — BP 120/76 | Ht 65.0 in | Wt 199.6 lb

## 2016-02-19 DIAGNOSIS — E119 Type 2 diabetes mellitus without complications: Secondary | ICD-10-CM

## 2016-02-19 NOTE — Progress Notes (Signed)

## 2016-03-04 ENCOUNTER — Ambulatory Visit (INDEPENDENT_AMBULATORY_CARE_PROVIDER_SITE_OTHER): Payer: Medicare PPO | Admitting: Podiatry

## 2016-03-04 ENCOUNTER — Ambulatory Visit: Payer: Medicare PPO

## 2016-03-04 ENCOUNTER — Encounter: Payer: Self-pay | Admitting: Podiatry

## 2016-03-04 DIAGNOSIS — Z09 Encounter for follow-up examination after completed treatment for conditions other than malignant neoplasm: Secondary | ICD-10-CM

## 2016-03-04 DIAGNOSIS — M2040 Other hammer toe(s) (acquired), unspecified foot: Secondary | ICD-10-CM

## 2016-03-04 NOTE — Progress Notes (Signed)
Patient ID: Cheryl Joseph, female   DOB: 04-06-1941, 75 y.o.   MRN: [id number]  Subjective: Cheryl Joseph is a 75 y.o. is seen today in office s/p right 2nd digit hammertoe repair (arthroplasty). She states that she is still in some swelling to the area that she's having no pain. She is able to wear a regular shoe without any difficulty.Denies any systemic complaints such as fevers, chills, nausea, vomiting. No calf pain, chest pain, shortness of breath.   Objective: General: No acute distress, AAOx3  DP/PT pulses palpable 2/4, CRT < 3 sec to all digits.  Protective sensation intact. Motor function intact.  Right foot: Incision is well coapted without any evidence of dehiscence and a scar has formed. No significant hyperkeratotic tissue buildup at this time. No tenderness palpation of the toe. There is mild swelling on the base the toe. No tenderness of the toe or other areas of the foot. There is no erythema or increase in warmth or other signs of infection. No open lesions or pre-ulcerative lesions.  No pain with calf compression, swelling, warmth, erythema.   Assessment and Plan:   Status post right 2nd hammertoe reapir  -Treatment options discussed including all alternatives, risks, and complications -At this time I showed her how to tape the toe to help control swelling. Continue supportive shoe gear. Continue ice the area. At times she is doing well with a discharge her from the postoperative course. If she is having any difficulty to call the office for follow-up and she agreed to this plan.  [name], DPM

## 2016-03-10 ENCOUNTER — Telehealth: Payer: Self-pay | Admitting: Family Medicine

## 2016-03-10 NOTE — Telephone Encounter (Signed)
I spoke with pt; pt stopped taking metformin 2 weeks ago; for the last 2 weeks FBS averaging 105-107. Today FBS 105. Pt said had previously taken steroids and pt wonders if that might have caused elevated BS. Pt is on no other diabetic meds.  Pt is watching diet and exercising again. Pt feels great. [place] Pt wants to know if Dr Glori Bickers thinks OK for pt to stop metformin permanently. Pt request cb.

## 2016-03-10 NOTE — Telephone Encounter (Signed)
Stop the metformin  The steroids could have caused inc blood glucose  Keep an eye on it and eat low glycemic diet Remove metformin from her med list please

## 2016-03-10 NOTE — Telephone Encounter (Signed)
[place] Patient Name: Cherrelle Shelley DOB: [birth year] Initial Comment Caller has questions regarding medications and BS-- no sx Nurse Assessment Nurse: Dimas Chyle, RN, Dellis Filbert Date/Time (Eastern Time): 03/10/2016 8:56:30 AM Confirm and document reason for call. If symptomatic, describe symptoms. You must click the next button to save text entered. ---Caller has questions regarding medications and BS-- no sx. On Metformin 500 mg daily. Stopped taking medication and BS is running normal now. Has the patient traveled out of the country within the last 30 days? ---No Does the patient have any new or worsening symptoms? ---No Please document clinical information provided and list any resource used. ---Advised caller that I would send report to office. Guidelines Guideline Title Affirmed Question Affirmed Notes Final Disposition User Clinical Call Dimas Chyle, RN, Dellis Filbert Comments Caller is wanting to be contacted about whether to resume med since BS is under control. She reports that her BS readings have been normal without the medication.

## 2016-03-10 NOTE — Telephone Encounter (Signed)
Pt notified of Dr. Marliss Coots comments and verbalized understanding. Med removed from med list

## 2016-03-30 ENCOUNTER — Other Ambulatory Visit: Payer: Self-pay | Admitting: Family Medicine

## 2016-04-19 DIAGNOSIS — H43813 Vitreous degeneration, bilateral: Secondary | ICD-10-CM | POA: Diagnosis not present

## 2016-04-24 LAB — HM DIABETES EYE EXAM

## 2016-05-19 ENCOUNTER — Other Ambulatory Visit (INDEPENDENT_AMBULATORY_CARE_PROVIDER_SITE_OTHER): Payer: Medicare PPO

## 2016-05-19 ENCOUNTER — Ambulatory Visit: Payer: Medicare PPO

## 2016-05-19 DIAGNOSIS — Z0000 Encounter for general adult medical examination without abnormal findings: Secondary | ICD-10-CM

## 2016-05-19 DIAGNOSIS — R739 Hyperglycemia, unspecified: Secondary | ICD-10-CM

## 2016-05-19 DIAGNOSIS — R7989 Other specified abnormal findings of blood chemistry: Secondary | ICD-10-CM | POA: Diagnosis not present

## 2016-05-19 LAB — CBC WITH DIFFERENTIAL/PLATELET
BASOS ABS: 0 10*3/uL (ref 0.0–0.1)
Basophils Relative: 0.7 % (ref 0.0–3.0)
Eosinophils Absolute: 0.1 10*3/uL (ref 0.0–0.7)
Eosinophils Relative: 1.7 % (ref 0.0–5.0)
HEMATOCRIT: 44.8 % (ref 36.0–46.0)
Hemoglobin: 15.1 g/dL — ABNORMAL HIGH (ref 12.0–15.0)
LYMPHS ABS: 2.4 10*3/uL (ref 0.7–4.0)
LYMPHS PCT: 37.6 % (ref 12.0–46.0)
MCHC: 33.8 g/dL (ref 30.0–36.0)
MCV: 90.3 fl (ref 78.0–100.0)
MONOS PCT: 9.6 % (ref 3.0–12.0)
Monocytes Absolute: 0.6 10*3/uL (ref 0.1–1.0)
NEUTROS PCT: 50.4 % (ref 43.0–77.0)
Neutro Abs: 3.2 10*3/uL (ref 1.4–7.7)
Platelets: 234 10*3/uL (ref 150.0–400.0)
RBC: 4.97 Mil/uL (ref 3.87–5.11)
RDW: 14.3 % (ref 11.5–15.5)
WBC: 6.3 10*3/uL (ref 4.0–10.5)

## 2016-05-19 LAB — COMPREHENSIVE METABOLIC PANEL
ALK PHOS: 70 U/L (ref 39–117)
ALT: 11 U/L (ref 0–35)
AST: 15 U/L (ref 0–37)
Albumin: 3.8 g/dL (ref 3.5–5.2)
BILIRUBIN TOTAL: 0.7 mg/dL (ref 0.2–1.2)
BUN: 20 mg/dL (ref 6–23)
CO2: 29 mEq/L (ref 19–32)
CREATININE: 0.94 mg/dL (ref 0.40–1.20)
Calcium: 9.3 mg/dL (ref 8.4–10.5)
Chloride: 106 mEq/L (ref 96–112)
GFR: 61.48 mL/min (ref >60.00)
GLUCOSE: 118 mg/dL — AB (ref 70–99)
Potassium: 4.7 mEq/L (ref 3.5–5.1)
Sodium: 142 mEq/L (ref 135–145)
TOTAL PROTEIN: 6.7 g/dL (ref 6.0–8.3)

## 2016-05-19 LAB — LIPID PANEL
Cholesterol: 201 mg/dL — ABNORMAL HIGH (ref 0–200)
HDL: 46.4 mg/dL (ref >39.00)
NONHDL: 154.6
TRIGLYCERIDES: 226 mg/dL — AB (ref 0.0–149.0)
Total CHOL/HDL Ratio: 4
VLDL: 45.2 mg/dL — ABNORMAL HIGH (ref 0.0–40.0)

## 2016-05-19 LAB — TSH: TSH: 2.18 u[IU]/mL (ref 0.35–4.50)

## 2016-05-19 LAB — HEMOGLOBIN A1C: Hgb A1c MFr Bld: 6.3 % (ref 4.6–6.5)

## 2016-05-19 LAB — LDL CHOLESTEROL, DIRECT: Direct LDL: 132 mg/dL

## 2016-05-25 ENCOUNTER — Ambulatory Visit (INDEPENDENT_AMBULATORY_CARE_PROVIDER_SITE_OTHER): Payer: Medicare PPO | Admitting: Family Medicine

## 2016-05-25 ENCOUNTER — Encounter: Payer: Self-pay | Admitting: Family Medicine

## 2016-05-25 VITALS — BP 128/72 | HR 62 | Temp 98.5°F | Ht 62.5 in | Wt 200.5 lb

## 2016-05-25 DIAGNOSIS — Z6836 Body mass index (BMI) 36.0-36.9, adult: Secondary | ICD-10-CM

## 2016-05-25 DIAGNOSIS — Z0000 Encounter for general adult medical examination without abnormal findings: Secondary | ICD-10-CM | POA: Diagnosis not present

## 2016-05-25 DIAGNOSIS — M8580 Other specified disorders of bone density and structure, unspecified site: Secondary | ICD-10-CM | POA: Diagnosis not present

## 2016-05-25 DIAGNOSIS — E7800 Pure hypercholesterolemia, unspecified: Secondary | ICD-10-CM

## 2016-05-25 DIAGNOSIS — E2839 Other primary ovarian failure: Secondary | ICD-10-CM | POA: Insufficient documentation

## 2016-05-25 DIAGNOSIS — L989 Disorder of the skin and subcutaneous tissue, unspecified: Secondary | ICD-10-CM | POA: Insufficient documentation

## 2016-05-25 DIAGNOSIS — E119 Type 2 diabetes mellitus without complications: Secondary | ICD-10-CM

## 2016-05-25 DIAGNOSIS — E6609 Other obesity due to excess calories: Secondary | ICD-10-CM | POA: Diagnosis not present

## 2016-05-25 DIAGNOSIS — IMO0001 Reserved for inherently not codable concepts without codable children: Secondary | ICD-10-CM

## 2016-05-25 NOTE — Progress Notes (Signed)
Pre visit review using our clinic review tool, if applicable. No additional management support is needed unless otherwise documented below in the visit note. 

## 2016-05-25 NOTE — Patient Instructions (Addendum)
Don't forget to make your mammogram appt at [facility]  Stop at check out for referral for dexa  Also for dermatology referral for lesion on arm  Will also make an appt with Dr Lorelei Pont for your knees  Take care of yourself

## 2016-05-25 NOTE — Progress Notes (Signed)
Subjective:    Patient ID: Cheryl Joseph, female    DOB: 1941-02-08, 75 y.o.   MRN: [id number]  HPI Here for health maintenance exam and to review chronic medical problems    Had surgery on her foot -improved/fixed a hammer toe   Still urinary problems since her hysterectomy   Has not had AMW visit yet   Wt Readings from Last 3 Encounters:  05/25/16 200 lb 8 oz (90.9 kg)  02/19/16 199 lb 9.6 oz (90.5 kg)  02/12/16 201 lb 6.4 oz (91.4 kg)  taking care of herself  Is eating healthy - avoids dairy and sugar and exercises- stationary bike and also DVD and walking  bmi is 36.0  A lot of pain in knees -worse on stairs -no arthritis on knees   Eye exam-was a few months ago   Mammogram 3/16-normal Self exam- no lumps   Colonoscopy 10/16-diverticulosis   Flu shot - here last week when she came   PNA vaccines complete   Tetanus shot 10/10 Zoster vaccine 8/09   dexa 1/12-hx of osteopenia that was improved Due for dexa  Falls-none but her balance is not as good as it used to be  Fractures -none   Hx of DM Lab Results  Component Value Date   HGBA1C 6.3 05/19/2016  no metformin  Has done DM teaching  Is controlling it with diet - reduced carbs and calories     Chemistry      Component Value Date/Time   NA 142 05/19/2016 0920   NA 141 08/30/2014 1233   K 4.7 05/19/2016 0920   K 4.1 08/30/2014 1233   CL 106 05/19/2016 0920   CL 107 08/30/2014 1233   CO2 29 05/19/2016 0920   CO2 28 08/30/2014 1233   BUN 20 05/19/2016 0920   BUN 16 08/30/2014 1233   CREATININE 0.94 05/19/2016 0920   CREATININE 1.01 08/30/2014 1233      Component Value Date/Time   CALCIUM 9.3 05/19/2016 0920   CALCIUM 8.5 08/30/2014 1233   ALKPHOS 70 05/19/2016 0920   ALKPHOS 85 08/30/2014 1233   AST 15 05/19/2016 0920   AST 26 08/30/2014 1233   ALT 11 05/19/2016 0920   ALT 21 08/30/2014 1233   BILITOT 0.7 05/19/2016 0920   BILITOT 0.4 08/30/2014 1233       Hx of hyperlipidemia Lab  Results  Component Value Date   CHOL 201 (H) 05/19/2016   CHOL 181 08/14/2014   CHOL 187 07/11/2013   Lab Results  Component Value Date   HDL 46.40 05/19/2016   HDL 44.00 08/14/2014   HDL 40.20 07/11/2013   Lab Results  Component Value Date   LDLCALC 102 (H) 08/14/2014   LDLCALC 107 (H) 07/11/2013   LDLCALC 112 (H) 06/16/2012   Lab Results  Component Value Date   TRIG 226.0 (H) 05/19/2016   TRIG 176.0 (H) 08/14/2014   TRIG 197.0 (H) 07/11/2013   Lab Results  Component Value Date   CHOLHDL 4 05/19/2016   CHOLHDL 4 08/14/2014   CHOLHDL 5 07/11/2013   Lab Results  Component Value Date   LDLDIRECT 132.0 05/19/2016   LDLDIRECT 105.9 12/26/2012   LDLDIRECT 98.5 09/15/2011   simvastatin and diet  Overall fairly well controlled - goal of LDL is under 100- she is doing better diet  No sausage/bacon/ red meat and fried foods  Has learned to cook better also   Hx of labile bp BP Readings from Last 3 Encounters:  05/25/16 128/72  02/19/16 120/76  01/05/16 114/68    Lab Results  Component Value Date   TSH 2.18 05/19/2016    Lab Results  Component Value Date   WBC 6.3 05/19/2016   HGB 15.1 (H) 05/19/2016   HCT 44.8 05/19/2016   MCV 90.3 05/19/2016   PLT 234.0 05/19/2016    Patient Active Problem List   Diagnosis Date Noted  . Estrogen deficiency 05/25/2016  . Skin lesion of left arm 05/25/2016  . Multiple complaints 12/19/2015  . Routine general medical examination at a health care facility 11/23/2015  . Hammertoe 10/28/2015  . Corns and callosities 10/28/2015  . Frequent loose stools 02/14/2015  . Diverticulosis of colon without hemorrhage 02/14/2015  . Colon cancer screening 08/19/2014  . Labile blood pressure 02/25/2014  . Encounter for Medicare annual wellness exam 07/16/2013  . Diabetes type 2, controlled ([place]) 05/14/2009  . RHEUMATOID ARTHRITIS 05/13/2008  . ELEVATED BP W/O HYPERTENSION 05/13/2008  . Osteopenia 03/07/2007  . Hyperlipidemia 01/03/2007   . Obesity 01/03/2007  . ALLERGIC RHINITIS, SEASONAL 01/03/2007  . ASTHMA 01/03/2007  . OVERACTIVE BLADDER 01/03/2007   Past Medical History:  Diagnosis Date  . Anginal pain ([place])   . Arthritis    RA hands(seronegative)  . Asthma   . Back pain   . Diabetes mellitus without complication ([place])   . HPV in female 1996   HPV with colposcopy (all neg paps since)  . Hyperlipidemia   . Obesity   . Osteopenia   . Shortness of breath dyspnea   . Shoulder pain    Past Surgical History:  Procedure Laterality Date  . COLONOSCOPY WITH PROPOFOL N/A 05/01/2015   Procedure: COLONOSCOPY WITH PROPOFOL;  Surgeon: Manya Silvas, MD;  Location: [facility] ENDOSCOPY;  Service: Endoscopy;  Laterality: N/A;  . EYE SURGERY    . pelvic organ prolapse surgery   2015  . TONSILLECTOMY    . VAGINAL HYSTERECTOMY  2015   with pelvic organ prolapse surgery    Social History  Substance Use Topics  . Smoking status: Never Smoker  . Smokeless tobacco: Never Used  . Alcohol use No   Family History  Problem Relation Age of Onset  . Diabetes Mother   . Hyperlipidemia Mother   . Heart disease Mother   . Hypertension Mother   . Obesity Mother   . Kyphosis Mother   . Heart disease Father   . Hypertension Father   . Cancer Father     lung Cancer smoker  . Kyphosis Sister   . Diabetes Daughter    Allergies  Allergen Reactions  . Ezetimibe-Simvastatin     REACTION: leg pain  . Tolterodine Tartrate     REACTION: side effects  . Vesicare [Solifenacin Succinate]     Eye problems    Current Outpatient Prescriptions on File Prior to Visit  Medication Sig Dispense Refill  . b complex vitamins tablet Take 1 tablet by mouth daily. Reported on 12/18/2015    . Calcium Carbonate-Vitamin D (CALCIUM-VITAMIN D3 PO) Take 1 capsule by mouth daily. Reported on 12/18/2015    . cephALEXin (KEFLEX) 500 MG capsule Take 500 mg by mouth 3 (three) times daily.    Marland Kitchen glucosamine-chondroitin 500-400 MG tablet Take 1 tablet by  mouth daily. Reported on 12/18/2015    . guaiFENesin (MUCINEX) 600 MG 12 hr tablet Take by mouth 2 (two) times daily as needed. Reported on 12/18/2015    . Hyaluronic Acid-Vitamin C (HYALURONIC ACID PO) Take 1 tablet  by mouth daily. Reported on 12/18/2015    . Hydrocodone-Acetaminophen 5-300 MG TABS Take 1 tablet by mouth every 6 (six) hours.    . montelukast (SINGULAIR) 10 MG tablet Take 1 tablet (10 mg total) by mouth at bedtime. 90 tablet 3  . Multiple Vitamin (MULTIVITAMIN) capsule Take 1 capsule by mouth daily. Reported on 12/18/2015    . Omega-3 Fatty Acids (FISH OIL PO) Take 1 capsule by mouth daily. Reported on 12/18/2015    . oxybutynin (OXYTROL) 3.9 MG/24HR Place 1 patch onto the skin every 3 (three) days. 8 patch 12  . Probiotic Product (PROBIOTIC DAILY PO) Take 1 tablet by mouth daily.    . promethazine (PHENERGAN) 12.5 MG tablet Take 12.5 mg by mouth every 8 (eight) hours as needed for nausea or vomiting.    Marland Kitchen PROVENTIL HFA 108 (90 Base) MCG/ACT inhaler INHALE TWO PUFFS BY MOUTH EVERY 4 HOURS AS NEEDED FOR WHEEZING. AND 2 PUFFS BEFORE EXERCISE OR EXPOSURE TO COLD AIR 1 Inhaler 2  . simvastatin (ZOCOR) 40 MG tablet Take 1 tablet (40 mg total) by mouth at bedtime. 90 tablet 3  . Ubiquinol 100 MG CAPS Take 1 capsule by mouth 3 (three) times a week. Reported on 12/18/2015     No current facility-administered medications on file prior to visit.     Review of Systems Review of Systems  Constitutional: Negative for fever, appetite change, fatigue and unexpected weight change.  Eyes: Negative for pain and visual disturbance.  Respiratory: Negative for cough and shortness of breath.   Cardiovascular: Negative for cp or palpitations    Gastrointestinal: Negative for nausea, diarrhea and constipation.  Genitourinary: Negative for urgency and frequency.  Skin: Negative for pallor or rash   MSK pos for knee pain bilat Neurological: Negative for weakness, light-headedness, numbness and headaches.    Hematological: Negative for adenopathy. Does not bruise/bleed easily.  Psychiatric/Behavioral: Negative for dysphoric mood. The patient is not nervous/anxious.         Objective:   Physical Exam  Constitutional: She appears well-developed and well-nourished. No distress.  obese and well appearing   HENT:  Head: Normocephalic and atraumatic.  Right Ear: External ear normal.  Left Ear: External ear normal.  Mouth/Throat: Oropharynx is clear and moist.  Eyes: Conjunctivae and EOM are normal. Pupils are equal, round, and reactive to light. No scleral icterus.  Neck: Normal range of motion. Neck supple. No JVD present. Carotid bruit is not present. No thyromegaly present.  Cardiovascular: Normal rate, regular rhythm, normal heart sounds and intact distal pulses.  Exam reveals no gallop.   Pulmonary/Chest: Effort normal and breath sounds normal. No respiratory distress. She has no wheezes. She exhibits no tenderness.  Abdominal: Soft. Bowel sounds are normal. She exhibits no distension, no abdominal bruit and no mass. There is no tenderness.  Genitourinary: No breast swelling, tenderness, discharge or bleeding.  Genitourinary Comments: Breast exam: No mass, nodules, thickening, tenderness, bulging, retraction, inflamation, nipple discharge or skin changes noted.  No axillary or clavicular LA.      Musculoskeletal: Normal range of motion. She exhibits no edema or tenderness.  No kyphosis   Lymphadenopathy:    She has no cervical adenopathy.  Neurological: She is alert. She has normal reflexes. No cranial nerve deficit. She exhibits normal muscle tone. Coordination normal.  Skin: Skin is warm and dry. No rash noted. No erythema. No pallor.  Solar lentigines diffusely  Psychiatric: She has a normal mood and affect.  Assessment & Plan:   Problem List Items Addressed This Visit      Endocrine   Diabetes type 2, controlled ([place])    Lab Results  Component Value Date   HGBA1C  6.3 05/19/2016   Enc to keep working on low glycemic diet and weight loss  Continue metformin         Musculoskeletal and Integument   Osteopenia    dexa ordered No falls or fx Disc need for calcium/ vitamin D/ wt bearing exercise and bone density test every 2 y to monitor Disc safety/ fracture risk in detail        Skin lesion of left arm    Area on L arm above wrist-scabbed appearance Per pt not healing  Suspicious for early skin cancer (? Squamous) Will ref to derm for eval and tx       Relevant Orders   Ambulatory referral to Dermatology     Other   Estrogen deficiency   Relevant Orders   DG Bone Density (Completed)   Hyperlipidemia    Disc goals for lipids and reasons to control them Rev labs with pt Rev low sat fat diet in detail Enc her to keep up the good work with diet  LDL almost at goal -is 102        Obesity    Discussed how this problem influences overall health and the risks it imposes  Reviewed plan for weight loss with lower calorie diet (via better food choices and also portion control or program like weight watchers) and exercise building up to or more than 30 minutes 5 days per week including some aerobic activity         Routine general medical examination at a health care facility - Primary    Reviewed health habits including diet and exercise and skin cancer prevention Reviewed appropriate screening tests for age  Also reviewed health mt list, fam hx and immunization status , as well as social and family history   See HPI Labs reviewed Planning AMW visit Don't forget to make your mammogram appt at [facility]  Stop at check out for referral for dexa  Also for dermatology referral for lesion on arm        Other Visit Diagnoses   None.

## 2016-05-27 ENCOUNTER — Other Ambulatory Visit: Payer: Self-pay | Admitting: Family Medicine

## 2016-05-27 ENCOUNTER — Ambulatory Visit
Admission: RE | Admit: 2016-05-27 | Discharge: 2016-05-27 | Disposition: A | Discharge status: Home / Self Care | Payer: Medicare PPO | Source: Ambulatory Visit | Attending: Family Medicine | Admitting: Family Medicine

## 2016-05-27 DIAGNOSIS — M8588 Other specified disorders of bone density and structure, other site: Secondary | ICD-10-CM | POA: Diagnosis not present

## 2016-05-27 DIAGNOSIS — E2839 Other primary ovarian failure: Secondary | ICD-10-CM | POA: Diagnosis not present

## 2016-05-27 DIAGNOSIS — Z780 Asymptomatic menopausal state: Secondary | ICD-10-CM | POA: Diagnosis not present

## 2016-05-27 DIAGNOSIS — M8589 Other specified disorders of bone density and structure, multiple sites: Secondary | ICD-10-CM | POA: Insufficient documentation

## 2016-05-27 DIAGNOSIS — M85852 Other specified disorders of bone density and structure, left thigh: Secondary | ICD-10-CM | POA: Diagnosis not present

## 2016-05-27 DIAGNOSIS — Z1231 Encounter for screening mammogram for malignant neoplasm of breast: Secondary | ICD-10-CM

## 2016-05-27 LAB — HM DEXA SCAN

## 2016-05-27 NOTE — Assessment & Plan Note (Signed)
Area on L arm above wrist-scabbed appearance Per pt not healing  Suspicious for early skin cancer (? Squamous) Will ref to derm for eval and tx

## 2016-05-27 NOTE — Assessment & Plan Note (Signed)
Disc goals for lipids and reasons to control them Rev labs with pt Rev low sat fat diet in detail Enc her to keep up the good work with diet  LDL almost at goal -is 102

## 2016-05-27 NOTE — Assessment & Plan Note (Signed)
Lab Results  Component Value Date   HGBA1C 6.3 05/19/2016   Enc to keep working on low glycemic diet and weight loss  Continue metformin

## 2016-05-27 NOTE — Assessment & Plan Note (Signed)
Discussed how this problem influences overall health and the risks it imposes  Reviewed plan for weight loss with lower calorie diet (via better food choices and also portion control or program like weight watchers) and exercise building up to or more than 30 minutes 5 days per week including some aerobic activity    

## 2016-05-27 NOTE — Assessment & Plan Note (Signed)
Reviewed health habits including diet and exercise and skin cancer prevention Reviewed appropriate screening tests for age  Also reviewed health mt list, fam hx and immunization status , as well as social and family history   See HPI Labs reviewed Planning AMW visit Don't forget to make your mamm[facility] appt [facility]  Stop at check out for referral for dexa  Also for dermatology referral for lesion on arm

## 2016-05-27 NOTE — Assessment & Plan Note (Signed)
dexa ordered  No falls or fx  Disc need for calcium/ vitamin D/ wt bearing exercise and bone density test every 2 y to monitor Disc safety/ fracture risk in detail   

## 2016-05-31 ENCOUNTER — Ambulatory Visit (INDEPENDENT_AMBULATORY_CARE_PROVIDER_SITE_OTHER): Payer: Medicare PPO

## 2016-05-31 VITALS — BP 122/80 | HR 64 | Temp 97.9°F | Ht 63.0 in | Wt 200.0 lb

## 2016-05-31 DIAGNOSIS — Z0000 Encounter for general adult medical examination without abnormal findings: Secondary | ICD-10-CM

## 2016-05-31 NOTE — Progress Notes (Signed)
Subjective:   Cheryl Joseph is a 75 y.o. female who presents for Medicare Annual (Subsequent) preventive examination.  Review of Systems:  N/A Cardiac Risk Factors include: advanced age (>84men, >55 women);obesity (BMI >30kg/m2);dyslipidemia;diabetes mellitus;hypertension     Objective:     Vitals: BP 122/80 (BP Location: Right Arm, Patient Position: Sitting, Cuff Size: Normal)   Pulse 64   Temp 97.9 F (36.6 C) (Oral)   Ht 5\' 3"  (1.6 m)   Wt 200 lb (90.7 kg)   SpO2 94%   BMI 35.43 kg/m   Body mass index is 35.43 kg/m.   Tobacco History  Smoking Status  . Never Smoker  Smokeless Tobacco  . Never Used     Counseling given: No   Past Medical History:  Diagnosis Date  . Anginal pain ([place])   . Arthritis    RA hands(seronegative)  . Asthma   . Back pain   . Diabetes mellitus without complication ([place])   . HPV in female 1996   HPV with colposcopy (all neg paps since)  . Hyperlipidemia   . Obesity   . Osteopenia   . Shortness of breath dyspnea   . Shoulder pain    Past Surgical History:  Procedure Laterality Date  . COLONOSCOPY WITH PROPOFOL N/A 05/01/2015   Procedure: COLONOSCOPY WITH PROPOFOL;  Surgeon: Manya Silvas, MD;  Location: [facility] ENDOSCOPY;  Service: Endoscopy;  Laterality: N/A;  . EYE SURGERY    . pelvic organ prolapse surgery   2015  . TONSILLECTOMY    . VAGINAL HYSTERECTOMY  2015   with pelvic organ prolapse surgery    Family History  Problem Relation Age of Onset  . Diabetes Mother   . Hyperlipidemia Mother   . Heart disease Mother   . Hypertension Mother   . Obesity Mother   . Kyphosis Mother   . Heart disease Father   . Hypertension Father   . Cancer Father     lung Cancer smoker  . Diabetes Daughter   . Kyphosis Sister    History  Sexual Activity  . Sexual activity: Yes    Outpatient Encounter Prescriptions as of 05/31/2016  Medication Sig  . b complex vitamins tablet Take 1 tablet by mouth daily. Reported on  12/18/2015  . Calcium Carbonate-Vitamin D (CALCIUM-VITAMIN D3 PO) Take 1 capsule by mouth daily. Reported on 12/18/2015  . cephALEXin (KEFLEX) 500 MG capsule Take 500 mg by mouth 3 (three) times daily.  Marland Kitchen glucosamine-chondroitin 500-400 MG tablet Take 1 tablet by mouth daily. Reported on 12/18/2015  . guaiFENesin (MUCINEX) 600 MG 12 hr tablet Take by mouth 2 (two) times daily as needed. Reported on 12/18/2015  . Hyaluronic Acid-Vitamin C (HYALURONIC ACID PO) Take 1 tablet by mouth daily. Reported on 12/18/2015  . Hydrocodone-Acetaminophen 5-300 MG TABS Take 1 tablet by mouth every 6 (six) hours.  . montelukast (SINGULAIR) 10 MG tablet Take 1 tablet (10 mg total) by mouth at bedtime.  . Multiple Vitamin (MULTIVITAMIN) capsule Take 1 capsule by mouth daily. Reported on 12/18/2015  . Omega-3 Fatty Acids (FISH OIL PO) Take 1 capsule by mouth daily. Reported on 12/18/2015  . oxybutynin (OXYTROL) 3.9 MG/24HR Place 1 patch onto the skin every 3 (three) days.  . Phenazopyridine HCl (AZO TABS PO) Take 1 tablet by mouth 2 (two) times daily.  . Probiotic Product (PROBIOTIC DAILY PO) Take 1 tablet by mouth daily.  . promethazine (PHENERGAN) 12.5 MG tablet Take 12.5 mg by mouth every 8 (eight)  hours as needed for nausea or vomiting.  Marland Kitchen PROVENTIL HFA 108 (90 Base) MCG/ACT inhaler INHALE TWO PUFFS BY MOUTH EVERY 4 HOURS AS NEEDED FOR WHEEZING. AND 2 PUFFS BEFORE EXERCISE OR EXPOSURE TO COLD AIR  . simvastatin (ZOCOR) 40 MG tablet Take 1 tablet (40 mg total) by mouth at bedtime.  Marland Kitchen Ubiquinol 100 MG CAPS Take 1 capsule by mouth 3 (three) times a week. Reported on 12/18/2015   No facility-administered encounter medications on file as of 05/31/2016.     Activities of Daily Living In your present state of health, do you have any difficulty performing the following activities: 05/31/2016  Hearing? N  Vision? N  Difficulty concentrating or making decisions? Y  Walking or climbing stairs? Y  Dressing or bathing? N  Doing  errands, shopping? N  Preparing Food and eating ? N  Using the Toilet? N  In the past six months, have you accidently leaked urine? Y  Do you have problems with loss of bowel control? N  Managing your Medications? N  Managing your Finances? N  Housekeeping or managing your Housekeeping? N  Some recent data might be hidden    Patient Care Team: Abner Greenspan, MD as PCP - General Birder Robson, MD as Referring Physician (Ophthalmology)    Assessment:     Hearing Screening   125Hz  250Hz  500Hz  1000Hz  2000Hz  3000Hz  4000Hz  6000Hz  8000Hz   Right ear:   40 40 40  40    Left ear:   40 40 40  40    Vision Screening Comments: Last vision exam with Dr. Merla Riches in Sept 2017   Exercise Activities and Dietary recommendations Current Exercise Habits: Home exercise routine, Type of exercise: walking;Other - see comments (stationary bike), Time (Minutes): 30, Frequency (Times/Week): 5, Weekly Exercise (Minutes/Week): 150, Intensity: Moderate, Exercise limited by: None identified  Goals    . Increase physical activity          Starting 05/31/2016, I will continue to exercise for at least 30 min 5 days per week.       Fall Risk Fall Risk  05/31/2016 02/19/2016 02/12/2016 02/05/2016 01/05/2016  Falls in the past year? Yes No No No No  Number falls in past yr: 1 - - - -  Injury with Fall? No - - - -  Follow up Falls evaluation completed - - - -   Depression Screen PHQ 2/9 Scores 05/31/2016 01/05/2016 08/19/2014 07/16/2013  PHQ - 2 Score 0 0 0 0     Cognitive Function MMSE - Mini Mental State Exam 05/31/2016  Orientation to time 5  Orientation to Place 5  Registration 3  Attention/ Calculation 0  Recall 3  Language- name 2 objects 0  Language- repeat 1  Language- follow 3 step command 3  Language- read & follow direction 0  Write a sentence 0  Copy design 0  Total score 20     PLEASE NOTE: A Mini-Cog screen was completed. Maximum score is 20. A value of 0 denotes this part of [place]  MMSE was not completed or the patient failed this part of the Mini-Cog screening.   Mini-Cog Screening Orientation to Time - Max 5 pts Orientation to Place - Max 5 pts Registration - Max 3 pts Recall - Max 3 pts Language Repeat - Max 1 pts Language Follow 3 Step Command - Max 3 pts     Immunization History  Administered Date(s) Administered  . Influenza Split 05/27/2011, 05/01/2012  . Influenza Whole 04/27/2007,  05/23/2008, 05/14/2009, 05/05/2010  . Influenza,inj,Quad PF,36+ Mos 05/21/2013, 04/26/2014, 05/09/2015, 05/19/2016  . Pneumococcal Conjugate-13 08/19/2014  . Pneumococcal Polysaccharide-23 05/31/2005  . Td 11/24/1998, 05/14/2009  . Zoster 03/05/2008   Screening Tests Health Maintenance  Topic Date Due  . MAMMOGRAM  07/25/2016 (Originally 10/14/2015)  . HEMOGLOBIN A1C  11/17/2016  . OPHTHALMOLOGY EXAM  04/24/2017  . FOOT EXAM  05/25/2017  . TETANUS/TDAP  05/15/2019  . INFLUENZA VACCINE  Completed  . DEXA SCAN  Completed  . ZOSTAVAX  Completed  . PNA vac Low Risk Adult  Completed      Plan:     I have personally reviewed and addressed the Medicare Annual Wellness questionnaire and have noted the following in the patient's chart:  A. Medical and social history B. Use of alcohol, tobacco or illicit drugs  C. Current medications and supplements D. Functional ability and status E.  Nutritional status F.  Physical activity G. Advance directives H. List of other physicians I.  Hospitalizations, surgeries, and ER visits in previous 12 months J.  [place] to include hearing, vision, cognitive, depression L. Referrals and appointments - none  In addition, I have reviewed and discussed with patient certain preventive protocols, quality metrics, and best practice recommendations. A written personalized care plan for preventive services as well as general preventive health recommendations were provided to patient.  See attached scanned questionnaire for  additional information.   Signed,   Lindell Noe, MHA, BS, LPN Health Coach

## 2016-05-31 NOTE — Progress Notes (Signed)
Pre visit review using our clinic review tool, if applicable. No additional management support is needed unless otherwise documented below in the visit note. 

## 2016-05-31 NOTE — Progress Notes (Signed)
PCP notes:   Health maintenance:  Mammogram - scheduled for December 2017  Abnormal screenings:   Fall risk - hx of fall without injury  Patient concerns:   None  Nurse concerns:  None  Next PCP appt:   N/A; pt already had CPE  I reviewed health advisor's note, was available for consultation, and agree with documentation and plan. Loura Pardon MD

## 2016-05-31 NOTE — Patient Instructions (Signed)
Cheryl Joseph , Thank you for taking time to come for your Medicare Wellness Visit. I appreciate your ongoing commitment to your health goals. Please review the following plan we discussed and let me know if I can assist you in the future.   These are the goals we discussed: Goals    . Increase physical activity          Starting 05/31/2016, I will continue to exercise for at least 30 min 5 days per week.        This is a list of the screening recommended for you and due dates:  Health Maintenance  Topic Date Due  . Mammogram  07/25/2016*  . Hemoglobin A1C  11/17/2016  . Eye exam for diabetics  04/24/2017  . Complete foot exam   05/25/2017  . Tetanus Vaccine  05/15/2019  . Flu Shot  Completed  . DEXA scan (bone density measurement)  Completed  . Shingles Vaccine  Completed  . Pneumonia vaccines  Completed  *Topic was postponed. The date shown is not the original due date.   Preventive Care for Adults  A healthy lifestyle and preventive care can promote health and wellness. Preventive health guidelines for adults include the following key practices.  . A routine yearly physical is a good way to check with your health care provider about your health and preventive screening. It is a chance to share any concerns and updates on your health and to receive a thorough exam.  . Visit your dentist for a routine exam and preventive care every 6 months. Brush your teeth twice a day and floss once a day. Good oral hygiene prevents tooth decay and gum disease.  . The frequency of eye exams is based on your age, health, family medical history, use  of contact lenses, and other factors. Follow your health care provider's ecommendations for frequency of eye exams.  . Eat a healthy diet. Foods like vegetables, fruits, whole grains, low-fat dairy products, and lean protein foods contain the nutrients you need without too many calories. Decrease your intake of foods high in solid fats, added sugars,  and salt. Eat the right amount of calories for you. Get information about a proper diet from your health care provider, if necessary.  . Regular physical exercise is one of the most important things you can do for your health. Most adults should get at least 150 minutes of moderate-intensity exercise (any activity that increases your heart rate and causes you to sweat) each week. In addition, most adults need muscle-strengthening exercises on 2 or more days a week.  Silver Sneakers may be a benefit available to you. To determine eligibility, you may visit the website: www.silversneakers.com or contact program at 610-600-8714 Mon-Fri between 8AM-8PM.   . Maintain a healthy weight. The body mass index (BMI) is a screening tool to identify possible weight problems. It provides an estimate of body fat based on height and weight. Your health care provider can find your BMI and can help you achieve or maintain a healthy weight.   For adults 20 years and older: ? A BMI below 18.5 is considered underweight. ? A BMI of 18.5 to 24.9 is normal. ? A BMI of 25 to 29.9 is considered overweight. ? A BMI of 30 and above is considered obese.   . Maintain normal blood lipids and cholesterol levels by exercising and minimizing your intake of saturated fat. Eat a balanced diet with plenty of fruit and vegetables. Blood tests for lipids  and cholesterol should begin at age 32 and be repeated every 5 years. If your lipid or cholesterol levels are high, you are over 50, or you are at high risk for heart disease, you may need your cholesterol levels checked more frequently. Ongoing high lipid and cholesterol levels should be treated with medicines if diet and exercise are not working.  . If you smoke, find out from your health care provider how to quit. If you do not use tobacco, please do not start.  . If you choose to drink alcohol, please do not consume more than 2 drinks per day. One drink is considered to be 12  ounces (355 mL) of beer, 5 ounces (148 mL) of wine, or 1.5 ounces (44 mL) of liquor.  . If you are 57-70 years old, ask your health care provider if you should take aspirin to prevent strokes.  . Use sunscreen. Apply sunscreen liberally and repeatedly throughout the day. You should seek shade when your shadow is shorter than you. Protect yourself by wearing long sleeves, pants, a wide-brimmed hat, and sunglasses year round, whenever you are outdoors.  . Once a month, do a whole body skin exam, using a mirror to look at the skin on your back. Tell your health care provider of new moles, moles that have irregular borders, moles that are larger than a pencil eraser, or moles that have changed in shape or color.

## 2016-06-02 ENCOUNTER — Encounter: Payer: Self-pay | Admitting: *Deleted

## 2016-06-03 ENCOUNTER — Encounter: Payer: Self-pay | Admitting: Family Medicine

## 2016-06-03 ENCOUNTER — Ambulatory Visit (INDEPENDENT_AMBULATORY_CARE_PROVIDER_SITE_OTHER): Payer: Medicare PPO | Admitting: Family Medicine

## 2016-06-03 VITALS — BP 118/62 | HR 61 | Temp 98.3°F | Ht 63.0 in | Wt 199.5 lb

## 2016-06-03 DIAGNOSIS — M25562 Pain in left knee: Secondary | ICD-10-CM

## 2016-06-03 DIAGNOSIS — M2392 Unspecified internal derangement of left knee: Secondary | ICD-10-CM | POA: Diagnosis not present

## 2016-06-03 NOTE — Progress Notes (Signed)
Dr. Karleen Hampshire T. Ulyess Muto, MD, CAQ Sports Medicine [facility] [address] [place], [address] Phone: [phone] Fax: [phone]  06/03/2016  Patient: Cheryl Joseph, MRN: [id number], DOB: 29-Dec-1940, 75 y.o.  Primary Physician:  Roxy Manns, MD   Chief Complaint  Patient presents with  . Knee Pain    Bilateral but right is worse   Subjective:   Cheryl Joseph is a 75 y.o. very pleasant female patient who presents with the following:  R lateral knee pain. She has no known specific injury that she can recall.   She has been to the chiropractor for this, and reportedly had normal x-rays per report at age 80.  Right knee is significantly worse than her left.  She is not physically active any more.  The pain is more lateral.  She has not had any significant giving way or locking up of the joint.  Dr. Jonnie Finner. WB knee x-rays. Minimal OA per report.   Past Medical History, Surgical History, Social History, Family History, Problem List, Medications, and Allergies have been reviewed and updated if relevant.  Patient Active Problem List   Diagnosis Date Noted  . Estrogen deficiency 05/25/2016  . Skin lesion of left arm 05/25/2016  . Multiple complaints 12/19/2015  . Routine general medical examination at a health care facility 11/23/2015  . Hammertoe 10/28/2015  . Corns and callosities 10/28/2015  . Frequent loose stools 02/14/2015  . Diverticulosis of colon without hemorrhage 02/14/2015  . Colon cancer screening 08/19/2014  . Labile blood pressure 02/25/2014  . Encounter for Medicare annual wellness exam 07/16/2013  . Diabetes type 2, controlled (H[facility]) 05/14/2009  . RHEUMATOID ARTHRITIS 05/13/2008  . ELEVATED BP W/O HYPERTENSION 05/13/2008  . Osteopenia 03/07/2007  . Hyperlipidemia 01/03/2007  . Obesity 01/03/2007  . ALLERGIC RHINITIS, SEASONAL 01/03/2007  . ASTHMA 01/03/2007  . OVERACTIVE BLADDER 01/03/2007    Past Medical History:    Diagnosis Date  . Anginal pain ([facility])   . Arthritis    RA hands(seronegative)  . Asthma   . Back pain   . Diabetes mellitus without complication ([facility])   . HPV in female 1996   HPV with colposcopy (all neg paps since)  . Hyperlipidemia   . Obesity   . Osteopenia   . Shortness of breath dyspnea   . Shoulder pain     Past Surgical History:  Procedure Laterality Date  . COLONOSCOPY WITH PROPOFOL N/A 05/01/2015   Procedure: COLONOSCOPY WITH PROPOFOL;  Surgeon: Scot Jun, MD;  Location: [facility] END[facility]COPY;  Service: Endoscopy;  Laterality: N/A;  . EYE SURGERY    . pelvic organ prolapse surgery   2015  . TONSILLECTOMY    . VAGINAL HYSTERECTOMY  2015   with pelvic organ prolapse surgery     Social History   Social History  . Marital status: Married    Spouse name: N/A  . Number of children: N/A  . Years of education: N/A   Occupational History  . Not on file.   Social History Main Topics  . Smoking status: Never Smoker  . Smokeless tobacco: Never Used  . Alcohol use No  . Drug use: No  . Sexual activity: Yes   Other Topics Concern  . Not on file   Social History Narrative  . No narrative on file    Family History  Problem Relation Age of Onset  . Diabetes Mother   . Hyperlipidemia Mother   . Heart disease Mother   .  Hypertension Mother   . Obesity Mother   . Kyphosis Mother   . Heart disease Father   . Hypertension Father   . Cancer Father     lung Cancer smoker  . Diabetes Daughter   . Kyphosis Sister     Allergies  Allergen Reactions  . Ezetimibe-Simvastatin     REACTION: leg pain  . Tolterodine Tartrate     REACTION: side effects  . Vesicare [Solifenacin Succinate]     Eye problems     Medication list reviewed and updated in full in [facility].  GEN: No fevers, chills. Nontoxic. Primarily MSK c/o today. MSK: Detailed in the HPI GI: tolerating PO intake without difficulty Neuro: No numbness, parasthesias, or tingling  associated. Otherwise the pertinent positives of the ROS are noted above.   Objective:   BP 118/62   Pulse 61   Temp 98.3 F (36.8 C) (Oral)   Ht 5\' 3"  (1.6 m)   Wt 199 lb 8 oz (90.5 kg)   BMI 35.34 kg/m    GEN: WDWN, NAD, Non-toxic, Alert & Oriented x 3 HEENT: Atraumatic, Normocephalic.  Ears and Nose: No external deformity. EXTR: No clubbing/cyanosis/edema NEURO: Normal gait.  PSYCH: Normally interactive. Conversant. Not depressed or anxious appearing.  Calm demeanor.   Knee:  0-115, B checked Gait: Normal heel toe pattern, mild antalgia ROM: 0-115 Effusion: neg Echymosis or edema: none Patellar tendon NT Painful PLICA: neg Patellar grind: negative Medial and lateral patellar facet loading: negative medial and lateral joint lines: lateral > medial on the R Mcmurray's neg Flexion-pinch neg Varus and valgus stress: stable Lachman: neg Ant and Post drawer: neg Hip abduction, IR, ER: WNL Hip flexion str: 5/5 Hip abd: 4+/5 Quad: 4/5 VMO atrophy mild Hamstring concentric and eccentric: 5/5   Radiology: No results found.   Assessment and Plan:   Left knee pain, unspecified chronicity - Plan: Ambulatory referral to Physical Therapy  Internal derangement of left knee - Plan: Ambulatory referral to Physical Therapy  Probable lateral meniscal pathology degenerative a character.  I reviewed what this means with the patient.  She is going to do a trial some physical therapy and wearing a patellar to a brace to see if this helps  With her symptoms.  If not improved in for 5 weeks, I have asked her to go ahead and set up an appointment with me.  If she is feeling much better, she can certainly cancel this.  Follow-up: No Follow-up on file.  Medications Discontinued During This Encounter  Medication Reason  . cephALEXin (KEFLEX) 500 MG capsule Completed Course   Orders Placed This Encounter  Procedures  . Ambulatory referral to Physical Therapy     Signed,  Karleen Hampshire T. Alexi Geibel, MD     Medication List       Accurate as of 06/03/16 11:59 PM. Always use your most recent med list.          AZO TABS PO Take 1 tablet by mouth 2 (two) times daily.   b complex vitamins tablet Take 1 tablet by mouth daily. Reported on 12/18/2015   CALCIUM-VITAMIN D3 PO Take 1 capsule by mouth daily. Reported on 12/18/2015   FISH OIL PO Take 1 capsule by mouth daily. Reported on 12/18/2015   glucosamine-chondroitin 500-400 MG tablet Take 1 tablet by mouth daily. Reported on 12/18/2015   guaiFENesin 600 MG 12 hr tablet Commonly known as:  MUCINEX Take by mouth 2 (two) times daily as needed. Reported on 12/18/2015  HYALURONIC ACID PO Take 1 tablet by mouth daily. Reported on 12/18/2015   Hydrocodone-Acetaminophen 5-300 MG Tabs Take 1 tablet by mouth every 6 (six) hours.   montelukast 10 MG tablet Commonly known as:  SINGULAIR Take 1 tablet (10 mg total) by mouth at bedtime.   multivitamin capsule Take 1 capsule by mouth daily. Reported on 12/18/2015   oxybutynin 3.9 MG/24HR Commonly known as:  OXYTROL Place 1 patch onto the skin every 3 (three) days.   PROBIOTIC DAILY PO Take 1 tablet by mouth daily.   promethazine 12.5 MG tablet Commonly known as:  PHENERGAN Take 12.5 mg by mouth every 8 (eight) hours as needed for nausea or vomiting.   PROVENTIL HFA 108 (90 Base) MCG/ACT inhaler Generic drug:  albuterol INHALE TWO PUFFS BY MOUTH EVERY 4 HOURS AS NEEDED FOR WHEEZING. AND 2 PUFFS BEFORE EXERCISE OR EXPOSURE TO COLD AIR   simvastatin 40 MG tablet Commonly known as:  ZOCOR Take 1 tablet (40 mg total) by mouth at bedtime.   Ubiquinol 100 MG Caps Take 1 capsule by mouth 3 (three) times a week. Reported on 12/18/2015

## 2016-06-03 NOTE — Progress Notes (Signed)
Pre visit review using our clinic review tool, if applicable. No additional management support is needed unless otherwise documented below in the visit note. 

## 2016-06-09 DIAGNOSIS — M25562 Pain in left knee: Secondary | ICD-10-CM | POA: Diagnosis not present

## 2016-06-11 DIAGNOSIS — D2261 Melanocytic nevi of right upper limb, including shoulder: Secondary | ICD-10-CM | POA: Diagnosis not present

## 2016-06-11 DIAGNOSIS — D2272 Melanocytic nevi of left lower limb, including hip: Secondary | ICD-10-CM | POA: Diagnosis not present

## 2016-06-11 DIAGNOSIS — D225 Melanocytic nevi of trunk: Secondary | ICD-10-CM | POA: Diagnosis not present

## 2016-06-11 DIAGNOSIS — X32XXXA Exposure to sunlight, initial encounter: Secondary | ICD-10-CM | POA: Diagnosis not present

## 2016-06-11 DIAGNOSIS — L821 Other seborrheic keratosis: Secondary | ICD-10-CM | POA: Diagnosis not present

## 2016-06-11 DIAGNOSIS — L570 Actinic keratosis: Secondary | ICD-10-CM | POA: Diagnosis not present

## 2016-06-15 DIAGNOSIS — M25562 Pain in left knee: Secondary | ICD-10-CM | POA: Diagnosis not present

## 2016-06-16 ENCOUNTER — Telehealth: Payer: Self-pay | Admitting: Family Medicine

## 2016-06-16 NOTE — Telephone Encounter (Signed)
Left voicemail letting pt know what Dr. Marliss Coots advised

## 2016-06-16 NOTE — Telephone Encounter (Signed)
[place] Patient Name: Cheryl Joseph DOB: 1940-12-05 Initial Comment Caller states husband has hand-foot-and mouth disease, and she would like to know if she is still able to have thanksgiving with her family, even though the husband isn't going. Nurse Assessment Nurse: Markus Daft, RN, Sherre Poot Date/Time Eilene Ghazi Time): 06/16/2016 9:56:03 AM Confirm and document reason for call. If symptomatic, describe symptoms. You must click the next button to save text entered. ---Caller states husband has hand-foot-and mouth disease, and she would like to know if she is still able to have thanksgiving with her family, even though the husband isn't going. No fever from husband for 2 days. Does the patient have any new or worsening symptoms? ---No Please document clinical information provided and list any resource used. ---RN advised, ".... is most contagious with hand-foot-and-mouth disease during the first week of the illness, the virus can remain in his or her body for weeks after the signs and symptoms are gone. That means your child still can infect others. Some people, particularly adults, can pass the virus without showing any signs or symptoms of the disease." "The usual period from initial infection to the onset of signs and symptoms (incubation period) is three to six days. A fever is often the first sign of hand-foot-and-mouth disease, followed by a sore throat and sometimes a poor appetite and malaise." - Caller verb. understanding. http://www.dixon.info/ hand-foot-and-mouth-disease/symptoms-causes/syc-20353035 Guidelines Guideline Title Affirmed Question Affirmed Notes Final Disposition User Clinical Call [place], RN, [employer]

## 2016-06-16 NOTE — Telephone Encounter (Signed)
If she does not have symptoms it should be ok

## 2016-06-18 DIAGNOSIS — M25562 Pain in left knee: Secondary | ICD-10-CM | POA: Diagnosis not present

## 2016-06-23 DIAGNOSIS — M25562 Pain in left knee: Secondary | ICD-10-CM | POA: Diagnosis not present

## 2016-06-25 DIAGNOSIS — M25562 Pain in left knee: Secondary | ICD-10-CM | POA: Diagnosis not present

## 2016-06-29 DIAGNOSIS — M25562 Pain in left knee: Secondary | ICD-10-CM | POA: Diagnosis not present

## 2016-07-02 ENCOUNTER — Ambulatory Visit
Admission: RE | Admit: 2016-07-02 | Discharge: 2016-07-02 | Disposition: A | Discharge status: Home / Self Care | Payer: Medicare PPO | Source: Ambulatory Visit | Attending: Family Medicine | Admitting: Family Medicine

## 2016-07-02 DIAGNOSIS — Z1231 Encounter for screening mammogram for malignant neoplasm of breast: Secondary | ICD-10-CM

## 2016-08-30 DIAGNOSIS — M1990 Unspecified osteoarthritis, unspecified site: Secondary | ICD-10-CM | POA: Diagnosis not present

## 2016-08-30 DIAGNOSIS — J4520 Mild intermittent asthma, uncomplicated: Secondary | ICD-10-CM | POA: Diagnosis not present

## 2016-08-30 DIAGNOSIS — E669 Obesity, unspecified: Secondary | ICD-10-CM | POA: Diagnosis not present

## 2016-08-30 DIAGNOSIS — J309 Allergic rhinitis, unspecified: Secondary | ICD-10-CM | POA: Diagnosis not present

## 2016-08-30 DIAGNOSIS — J329 Chronic sinusitis, unspecified: Secondary | ICD-10-CM | POA: Diagnosis not present

## 2016-08-30 DIAGNOSIS — E785 Hyperlipidemia, unspecified: Secondary | ICD-10-CM | POA: Diagnosis not present

## 2017-01-17 ENCOUNTER — Other Ambulatory Visit: Payer: Self-pay | Admitting: Family Medicine

## 2017-01-21 ENCOUNTER — Ambulatory Visit (INDEPENDENT_AMBULATORY_CARE_PROVIDER_SITE_OTHER): Payer: Medicare PPO | Admitting: Family Medicine

## 2017-01-21 ENCOUNTER — Ambulatory Visit (INDEPENDENT_AMBULATORY_CARE_PROVIDER_SITE_OTHER): Payer: Medicare PPO

## 2017-01-21 ENCOUNTER — Encounter: Payer: Self-pay | Admitting: Family Medicine

## 2017-01-21 VITALS — BP 130/74 | HR 71 | Temp 98.1°F | Wt 201.2 lb

## 2017-01-21 DIAGNOSIS — J988 Other specified respiratory disorders: Secondary | ICD-10-CM

## 2017-01-21 DIAGNOSIS — R05 Cough: Secondary | ICD-10-CM | POA: Diagnosis not present

## 2017-01-21 MED ORDER — HYDROCOD POLST-CPM POLST ER 10-8 MG/5ML PO SUER: 5.0000 mL | Freq: Two times a day (BID) | ORAL | 0 refills | Status: DC | PRN

## 2017-01-23 DIAGNOSIS — J988 Other specified respiratory disorders: Secondary | ICD-10-CM | POA: Insufficient documentation

## 2017-01-23 NOTE — Assessment & Plan Note (Signed)
New acute problem. Likely viral in origin. Given severe cough and age, chest x-ray obtained and was negative. Tussionex for cough. No indication for antibiotics.

## 2017-01-23 NOTE — Progress Notes (Signed)
Subjective:  Patient ID: Cheryl Joseph, female    DOB: [birth year]  Age: 76 y.o. MRN: [id number]  CC: Cough, URI symptoms  HPI:  76 year old female presents with the above complaints.  Patient reports a one-week history of sore throat, headache, ear pain, cough. She's also had rhinorrhea and congestion. Her predominant/most troublesome symptom is cough. Cough is nonproductive. No associated fevers or chills. No known exacerbating factors. She's been using over-the-counter medications without significant improvement. No shortness breath. No other associated symptoms. No other complaints at this time.  Social Hx   Social History   Social History  . Marital status: Married    Spouse name: N/A  . Number of children: N/A  . Years of education: N/A   Social History Main Topics  . Smoking status: Never Smoker  . Smokeless tobacco: Never Used  . Alcohol use No  . Drug use: No  . Sexual activity: Yes   Other Topics Concern  . None   Social History Narrative  . None    Review of Systems  Constitutional: Negative for fever.  HENT: Positive for congestion, ear pain, rhinorrhea and sore throat.   Respiratory: Positive for cough. Negative for shortness of breath.   Neurological: Positive for headaches.   Objective:  BP 130/74 (BP Location: Left Arm, Patient Position: Sitting, Cuff Size: Large)   Pulse 71   Temp 98.1 F (36.7 C) (Oral)   Wt 201 lb 4 oz (91.3 kg)   SpO2 97%   BMI 35.65 kg/m   BP/Weight 01/21/2017 06/03/2016 32/12/7122  Systolic BP 580 998 338  Diastolic BP 74 62 80  Wt. (Lbs) 201.25 199.5 200  BMI 35.65 35.34 35.43   Physical Exam  Constitutional: She is oriented to person, place, and time. She appears well-developed. No distress.  HENT:  Head: Normocephalic and atraumatic.  Mouth/Throat: Oropharynx is clear and moist.  Normal TM's bilaterally.   Cardiovascular: Normal rate and regular rhythm.   Pulmonary/Chest: Effort normal and breath sounds normal.  She has no wheezes. She has no rales.  Neurological: She is alert and oriented to person, place, and time.  Psychiatric: She has a normal mood and affect.  Vitals reviewed.  Lab Results  Component Value Date   WBC 6.3 05/19/2016   HGB 15.1 (H) 05/19/2016   HCT 44.8 05/19/2016   PLT 234.0 05/19/2016   GLUCOSE 118 (H) 05/19/2016   CHOL 201 (H) 05/19/2016   TRIG 226.0 (H) 05/19/2016   HDL 46.40 05/19/2016   LDLDIRECT 132.0 05/19/2016   LDLCALC 102 (H) 08/14/2014   ALT 11 05/19/2016   AST 15 05/19/2016   NA 142 05/19/2016   K 4.7 05/19/2016   CL 106 05/19/2016   CREATININE 0.94 05/19/2016   BUN 20 05/19/2016   CO2 29 05/19/2016   TSH 2.18 05/19/2016   HGBA1C 6.3 05/19/2016    Assessment & Plan:   Problem List Items Addressed This Visit      Respiratory   Respiratory infection - Primary    New acute problem. Likely viral in origin. Given severe cough and age, chest x-ray obtained and was negative. Tussionex for cough. No indication for antibiotics.      Relevant Orders   DG Chest 2 View (Completed)     Meds ordered this encounter  Medications  . chlorpheniramine-HYDROcodone (TUSSIONEX PENNKINETIC ER) 10-8 MG/5ML SUER    Sig: Take 5 mLs by mouth every 12 (twelve) hours as needed.    Dispense:  115 mL  Refill:  0    Follow-up: PRN  Hasty

## 2017-03-14 ENCOUNTER — Other Ambulatory Visit: Payer: Self-pay | Admitting: Family Medicine

## 2017-04-26 ENCOUNTER — Ambulatory Visit (INDEPENDENT_AMBULATORY_CARE_PROVIDER_SITE_OTHER): Payer: Medicare PPO

## 2017-04-26 DIAGNOSIS — Z23 Encounter for immunization: Secondary | ICD-10-CM | POA: Diagnosis not present

## 2017-05-09 ENCOUNTER — Ambulatory Visit (INDEPENDENT_AMBULATORY_CARE_PROVIDER_SITE_OTHER): Payer: Medicare PPO | Admitting: Podiatry

## 2017-05-09 ENCOUNTER — Encounter: Payer: Self-pay | Admitting: Podiatry

## 2017-05-09 ENCOUNTER — Other Ambulatory Visit: Payer: Self-pay | Admitting: Podiatry

## 2017-05-09 ENCOUNTER — Ambulatory Visit (INDEPENDENT_AMBULATORY_CARE_PROVIDER_SITE_OTHER): Payer: Medicare PPO

## 2017-05-09 DIAGNOSIS — M778 Other enthesopathies, not elsewhere classified: Secondary | ICD-10-CM

## 2017-05-09 DIAGNOSIS — M779 Enthesopathy, unspecified: Principal | ICD-10-CM

## 2017-05-09 DIAGNOSIS — M7752 Other enthesopathy of left foot: Secondary | ICD-10-CM | POA: Diagnosis not present

## 2017-05-09 DIAGNOSIS — M84375A Stress fracture, left foot, initial encounter for fracture: Secondary | ICD-10-CM | POA: Diagnosis not present

## 2017-05-09 NOTE — Progress Notes (Signed)
   Subjective:    Patient ID: Cheryl Joseph, female    DOB: 07/31/1940, 76 y.o.   MRN: [id number]  HPI: She presents today for chief complaint of pain to the dorsal aspect of the left foot times past 3-4 months. States the pain as a 7 out of 10 with walking. She climbed multiple steps at [employer] and since that time is been hurting.  Review of Systems  All other systems reviewed and are negative.      Objective:   Physical Exam: Vital signs are stable alert and oriented 3. Pulses are palpable. Neurologic sensory is intact. Deep tendon reflexes are intact. Muscle strength was 5 over 5 dorsiflexion plantar flexors and inverters everters all intrinsic musculature is intact. Orthopedic evaluation restrict all joints distal to the ankle for range of motion without crepitation. She has tenderness on palpation of the base of the third and fourth metatarsals of the left foot.  Radiographs taken today demonstrate swelling to the forefoot and midfoot. There appears to be stress fractures at the base of the fourth and third metatarsals. These are visible on 2 views. The nondisplaced non-comminuted fractures with no significant callus formation.        Assessment & Plan:  Stress fracture left foot.  Plan: Placed her in a [place]. We'll follow up with her in a month to 6 weeks.

## 2017-05-10 ENCOUNTER — Telehealth: Payer: Self-pay | Admitting: Podiatry

## 2017-05-10 NOTE — Telephone Encounter (Signed)
Returned patient call, informed her that she can wear the boot until next office visit, she can take off to shower and sleep, but continue with every day routine.  She was thankful for the call and information.

## 2017-05-10 NOTE — Telephone Encounter (Signed)
I saw Dr. Milinda Pointer yesterday and was put into a big boot. No instructions were given to what I can and cannot do. Can you please call me back at [phone]. Thank you.

## 2017-05-18 DIAGNOSIS — H43813 Vitreous degeneration, bilateral: Secondary | ICD-10-CM | POA: Diagnosis not present

## 2017-05-19 DIAGNOSIS — J45909 Unspecified asthma, uncomplicated: Secondary | ICD-10-CM | POA: Diagnosis not present

## 2017-05-19 DIAGNOSIS — E785 Hyperlipidemia, unspecified: Secondary | ICD-10-CM | POA: Diagnosis not present

## 2017-05-19 DIAGNOSIS — E669 Obesity, unspecified: Secondary | ICD-10-CM | POA: Diagnosis not present

## 2017-05-19 DIAGNOSIS — R002 Palpitations: Secondary | ICD-10-CM | POA: Diagnosis not present

## 2017-05-19 DIAGNOSIS — R0602 Shortness of breath: Secondary | ICD-10-CM | POA: Diagnosis not present

## 2017-05-19 DIAGNOSIS — E782 Mixed hyperlipidemia: Secondary | ICD-10-CM | POA: Diagnosis not present

## 2017-05-26 ENCOUNTER — Telehealth: Payer: Self-pay | Admitting: Family Medicine

## 2017-05-26 DIAGNOSIS — R002 Palpitations: Secondary | ICD-10-CM | POA: Diagnosis not present

## 2017-05-26 DIAGNOSIS — E1121 Type 2 diabetes mellitus with diabetic nephropathy: Secondary | ICD-10-CM

## 2017-05-26 DIAGNOSIS — E7800 Pure hypercholesterolemia, unspecified: Secondary | ICD-10-CM

## 2017-05-26 DIAGNOSIS — Z0000 Encounter for general adult medical examination without abnormal findings: Secondary | ICD-10-CM

## 2017-05-26 NOTE — Telephone Encounter (Signed)
-----   Message from Ellamae Sia sent at 05/24/2017  3:19 PM EDT ----- Regarding: Lab orders for Friday, 11.2.18 Patient is scheduled for CPX labs, please order future labs, Thanks , Karna Christmas

## 2017-05-27 ENCOUNTER — Other Ambulatory Visit (INDEPENDENT_AMBULATORY_CARE_PROVIDER_SITE_OTHER): Payer: Medicare PPO

## 2017-05-27 DIAGNOSIS — E7800 Pure hypercholesterolemia, unspecified: Secondary | ICD-10-CM | POA: Diagnosis not present

## 2017-05-27 DIAGNOSIS — Z0000 Encounter for general adult medical examination without abnormal findings: Secondary | ICD-10-CM

## 2017-05-27 DIAGNOSIS — E1121 Type 2 diabetes mellitus with diabetic nephropathy: Secondary | ICD-10-CM

## 2017-05-27 LAB — COMPREHENSIVE METABOLIC PANEL
ALT: 13 U/L (ref 0–35)
AST: 17 U/L (ref 0–37)
Albumin: 3.9 g/dL (ref 3.5–5.2)
Alkaline Phosphatase: 61 U/L (ref 39–117)
BUN: 20 mg/dL (ref 6–23)
CALCIUM: 9.3 mg/dL (ref 8.4–10.5)
CHLORIDE: 107 meq/L (ref 96–112)
CO2: 28 mEq/L (ref 19–32)
Creatinine, Ser: 0.86 mg/dL (ref 0.40–1.20)
GFR: 67.94 mL/min (ref >60.00)
Glucose, Bld: 124 mg/dL — ABNORMAL HIGH (ref 70–99)
POTASSIUM: 4.2 meq/L (ref 3.5–5.1)
SODIUM: 142 meq/L (ref 135–145)
Total Bilirubin: 0.6 mg/dL (ref 0.2–1.2)
Total Protein: 6.7 g/dL (ref 6.0–8.3)

## 2017-05-27 LAB — CBC WITH DIFFERENTIAL/PLATELET
BASOS PCT: 0.8 % (ref 0.0–3.0)
Basophils Absolute: 0 10*3/uL (ref 0.0–0.1)
EOS PCT: 2 % (ref 0.0–5.0)
Eosinophils Absolute: 0.1 10*3/uL (ref 0.0–0.7)
HCT: 46.4 % — ABNORMAL HIGH (ref 36.0–46.0)
HEMOGLOBIN: 15.3 g/dL — AB (ref 12.0–15.0)
Lymphocytes Relative: 34 % (ref 12.0–46.0)
Lymphs Abs: 1.9 10*3/uL (ref 0.7–4.0)
MCHC: 33 g/dL (ref 30.0–36.0)
MCV: 97.3 fl (ref 78.0–100.0)
MONO ABS: 0.4 10*3/uL (ref 0.1–1.0)
Monocytes Relative: 7.9 % (ref 3.0–12.0)
Neutro Abs: 3.1 10*3/uL (ref 1.4–7.7)
Neutrophils Relative %: 55.3 % (ref 43.0–77.0)
Platelets: 233 10*3/uL (ref 150.0–400.0)
RBC: 4.77 Mil/uL (ref 3.87–5.11)
RDW: 13.4 % (ref 11.5–15.5)
WBC: 5.6 10*3/uL (ref 4.0–10.5)

## 2017-05-27 LAB — HEMOGLOBIN A1C: HEMOGLOBIN A1C: 5.8 % (ref 4.6–6.5)

## 2017-05-27 LAB — LIPID PANEL
CHOLESTEROL: 183 mg/dL (ref 0–200)
HDL: 48.7 mg/dL (ref >39.00)
NonHDL: 134.37
TRIGLYCERIDES: 202 mg/dL — AB (ref 0.0–149.0)
Total CHOL/HDL Ratio: 4
VLDL: 40.4 mg/dL — AB (ref 0.0–40.0)

## 2017-05-27 LAB — LDL CHOLESTEROL, DIRECT: Direct LDL: 114 mg/dL

## 2017-05-27 LAB — TSH: TSH: 1.67 u[IU]/mL (ref 0.35–4.50)

## 2017-05-31 ENCOUNTER — Encounter: Payer: Self-pay | Admitting: *Deleted

## 2017-06-01 ENCOUNTER — Ambulatory Visit (INDEPENDENT_AMBULATORY_CARE_PROVIDER_SITE_OTHER): Payer: Medicare PPO

## 2017-06-01 VITALS — BP 122/78 | HR 63 | Temp 97.8°F | Ht 63.0 in | Wt 198.0 lb

## 2017-06-01 DIAGNOSIS — Z0000 Encounter for general adult medical examination without abnormal findings: Secondary | ICD-10-CM

## 2017-06-01 NOTE — Progress Notes (Signed)
Pre visit review using our clinic review tool, if applicable. No additional management support is needed unless otherwise documented below in the visit note. 

## 2017-06-01 NOTE — Progress Notes (Signed)
Subjective:   FATE GALANTI is a 76 y.o. female who presents for Medicare Annual (Subsequent) preventive examination.  Review of Systems:  N/A Cardiac Risk Factors include: advanced age (>19men, >34 women);obesity (BMI >30kg/m2);diabetes mellitus;dyslipidemia;hypertension     Objective:     Vitals: BP 122/78 (BP Location: Right Arm, Patient Position: Sitting, Cuff Size: Normal)   Pulse 63   Temp 97.8 F (36.6 C) (Oral)   Ht 5\' 3"  (1.6 m)   Wt 198 lb (89.8 kg)   SpO2 95%   BMI 35.07 kg/m   Body mass index is 35.07 kg/m.   Tobacco Social History   Tobacco Use  Smoking Status Never Smoker  Smokeless Tobacco Never Used     Counseling given: No   Past Medical History:  Diagnosis Date  . Anginal pain ([place])   . Arthritis    RA hands(seronegative)  . Asthma   . Back pain   . Diabetes mellitus without complication ([place])   . HPV in female 1996   HPV with colposcopy (all neg paps since)  . Hyperlipidemia   . Obesity   . Osteopenia   . Shortness of breath dyspnea   . Shoulder pain    Past Surgical History:  Procedure Laterality Date  . EYE SURGERY    . pelvic organ prolapse surgery   2015  . TONSILLECTOMY    . VAGINAL HYSTERECTOMY  2015   with pelvic organ prolapse surgery    Family History  Problem Relation Age of Onset  . Diabetes Mother   . Hyperlipidemia Mother   . Heart disease Mother   . Hypertension Mother   . Obesity Mother   . Kyphosis Mother   . Heart disease Father   . Hypertension Father   . Cancer Father        lung Cancer smoker  . Diabetes Daughter   . Kyphosis Sister    Social History   Substance and Sexual Activity  Sexual Activity Yes    Outpatient Encounter Medications as of 06/01/2017  Medication Sig  . Acetaminophen (TYLENOL EXTRA STRENGTH PO) Take 1 capsule 2 (two) times daily by mouth.  Marland Kitchen b complex vitamins tablet Take 1 tablet by mouth daily. Reported on 12/18/2015  . Calcium Carbonate-Vitamin D (CALCIUM-VITAMIN D3  PO) Take 1 capsule by mouth daily. Reported on 12/18/2015  . Cholecalciferol (VITAMIN D3 PO) Take 5,000 Units daily by mouth.  Marland Kitchen glucosamine-chondroitin 500-400 MG tablet Take 1 tablet by mouth daily. Reported on 12/18/2015  . Hyaluronic Acid-Vitamin C (HYALURONIC ACID PO) Take 1 tablet by mouth daily. Reported on 12/18/2015  . IBUPROFEN PO Take 1 capsule 2 (two) times daily by mouth.  . montelukast (SINGULAIR) 10 MG tablet TAKE ONE TABLET BY MOUTH AT BEDTIME  . Multiple Vitamin (MULTIVITAMIN) capsule Take 1 capsule by mouth daily. Reported on 12/18/2015  . Omega-3 Fatty Acids (FISH OIL PO) Take 1 capsule by mouth daily. Reported on 12/18/2015  . oxybutynin (OXYTROL) 3.9 MG/24HR Place 1 patch onto the skin every 3 (three) days.  . Probiotic Product (PROBIOTIC DAILY PO) Take 1 tablet by mouth daily.  Marland Kitchen PROVENTIL HFA 108 (90 Base) MCG/ACT inhaler INHALE TWO PUFFS BY MOUTH EVERY 4 HOURS AS NEEDED FOR WHEEZING. AND 2 PUFFS BEFORE EXERCISE OR EXPOSURE TO COLD AIR  . simvastatin (ZOCOR) 40 MG tablet TAKE ONE TABLET BY MOUTH AT BEDTIME  . TURMERIC PO Take 1 capsule daily by mouth.  . Ubiquinol 100 MG CAPS Take 1 capsule by mouth 3 (  three) times a week. Reported on 12/18/2015  . [DISCONTINUED] chlorpheniramine-HYDROcodone (TUSSIONEX PENNKINETIC ER) 10-8 MG/5ML SUER Take 5 mLs by mouth every 12 (twelve) hours as needed.  . [DISCONTINUED] guaiFENesin (MUCINEX) 600 MG 12 hr tablet Take by mouth 2 (two) times daily as needed. Reported on 12/18/2015  . [DISCONTINUED] Hydrocodone-Acetaminophen 5-300 MG TABS Take 1 tablet by mouth every 6 (six) hours.  . [DISCONTINUED] Phenazopyridine HCl (AZO TABS PO) Take 1 tablet by mouth 2 (two) times daily.  . [DISCONTINUED] promethazine (PHENERGAN) 12.5 MG tablet Take 12.5 mg by mouth every 8 (eight) hours as needed for nausea or vomiting.   No facility-administered encounter medications on file as of 06/01/2017.     Activities of Daily Living In your present state of health,  do you have any difficulty performing the following activities: 06/01/2017  Hearing? N  Vision? N  Difficulty concentrating or making decisions? N  Walking or climbing stairs? Y  Comment KNEE PAIN   Dressing or bathing? N  Doing errands, shopping? N  Preparing Food and eating ? N  Using the Toilet? N  In the past six months, have you accidently leaked urine? N  Do you have problems with loss of bowel control? N  Managing your Medications? N  Managing your Finances? N  Housekeeping or managing your Housekeeping? N  Some recent data might be hidden    Patient Care Team: Tower, Wynelle Fanny, MD as PCP - General Birder Robson, MD as Referring Physician (Ophthalmology) Garrel Ridgel, DPM as Consulting Physician (Podiatry) Yolonda Kida, MD as Consulting Physician (Cardiology)    Assessment:     Hearing Screening   125Hz  250Hz  500Hz  1000Hz  2000Hz  3000Hz  4000Hz  6000Hz  8000Hz   Right ear:   40 40 40  40    Left ear:   0 40 40  0    Vision Screening Comments: Last vision exam in Sept 2018 with Dr. George Ina   Exercise Activities and Dietary recommendations Current Exercise Habits: Home exercise routine, Type of exercise: walking;Other - see comments(STATIONARY CYCLE, EXERCISE DVDS), Time (Minutes): 30, Frequency (Times/Week): 7, Weekly Exercise (Minutes/Week): 210, Intensity: Mild, Exercise limited by: None identified  Goals    Starting 06/01/17, I will continue to exercise 15-30 min daily and to limit intake of added sugar in an effort to lose weight.     Fall Risk Fall Risk  06/01/2017 05/31/2016 02/19/2016 02/12/2016 02/05/2016  Falls in the past year? No Yes No No No  Comment - fall without injury; approx. 2 mths ago; pt lost balance - - -  Number falls in past yr: - 1 - - -  Injury with Fall? - No - - -  Follow up - Falls evaluation completed - - -   Depression Screen PHQ 2/9 Scores 06/01/2017 05/31/2016 01/05/2016 08/19/2014  PHQ - 2 Score 0 0 0 0  PHQ- 9 Score 0 - - -      Cognitive Function MMSE - Mini Mental State Exam 06/01/2017 05/31/2016  Orientation to time 5 5  Orientation to Place 5 5  Registration 3 3  Attention/ Calculation 0 0  Recall 3 3  Language- name 2 objects 0 0  Language- repeat 1 1  Language- follow 3 step command 3 3  Language- read & follow direction 0 0  Write a sentence 0 0  Copy design 0 0  Total score 20 20       PLEASE NOTE: A Mini-Cog screen was completed. Maximum score is 20. A value  of 0 denotes this part of [place] MMSE was not completed or the patient failed this part of the Mini-Cog screening.   Mini-Cog Screening Orientation to Time - Max 5 pts Orientation to Place - Max 5 pts Registration - Max 3 pts Recall - Max 3 pts Language Repeat - Max 1 pts Language Follow 3 Step Command - Max 3 pts   Immunization History  Administered Date(s) Administered  . Influenza Split 05/27/2011, 05/01/2012  . Influenza Whole 04/27/2007, 05/23/2008, 05/14/2009, 05/05/2010  . Influenza,inj,Quad PF,6+ Mos 05/21/2013, 04/26/2014, 05/09/2015, 05/19/2016, 04/26/2017  . Pneumococcal Conjugate-13 08/19/2014  . Pneumococcal Polysaccharide-23 05/31/2005  . Td 11/24/1998, 05/14/2009  . Zoster 03/05/2008   Screening Tests Health Maintenance  Topic Date Due  . FOOT EXAM  07/05/2017 (Originally 05/25/2017)  . MAMMOGRAM  07/02/2017  . HEMOGLOBIN A1C  11/24/2017  . OPHTHALMOLOGY EXAM  04/24/2018  . TETANUS/TDAP  05/15/2019  . INFLUENZA VACCINE  Completed  . DEXA SCAN  Completed  . PNA vac Low Risk Adult  Completed      Plan:      I have personally reviewed, addressed, and noted the following in the patient's chart:  A. Medical and social history B. Use of alcohol, tobacco or illicit drugs  C. Current medications and supplements D. Functional ability and status E.  Nutritional status F.  Physical activity G. Advance directives H. List of other physicians I.  Hospitalizations, surgeries, and ER visits in previous 12  months J.  [place] to include hearing, vision, cognitive, depression L. Referrals and appointments - none  In addition, I have reviewed and discussed with patient certain preventive protocols, quality metrics, and best practice recommendations. A written personalized care plan for preventive services as well as general preventive health recommendations were provided to patient.  See attached scanned questionnaire for additional information.   Signed,   Lindell Noe, MHA, BS, LPN Health Coach

## 2017-06-01 NOTE — Patient Instructions (Signed)
Cheryl Joseph , Thank you for taking time to come for your Medicare Wellness Visit. I appreciate your ongoing commitment to your health goals. Please review the following plan we discussed and let me know if I can assist you in the future.   These are the goals we discussed: Goals    Starting 06/01/17, I will continue to exercise 15-30 min daily and to limit intake of added sugar in an effort to lose weight.       This is a list of the screening recommended for you and due dates:  Health Maintenance  Topic Date Due  . Complete foot exam   07/05/2017*  . Mammogram  07/02/2017  . Hemoglobin A1C  11/24/2017  . Eye exam for diabetics  04/24/2018  . Tetanus Vaccine  05/15/2019  . Flu Shot  Completed  . DEXA scan (bone density measurement)  Completed  . Pneumonia vaccines  Completed  *Topic was postponed. The date shown is not the original due date.   Preventive Care for Adults  A healthy lifestyle and preventive care can promote health and wellness. Preventive health guidelines for adults include the following key practices.  . A routine yearly physical is a good way to check with your health care provider about your health and preventive screening. It is a chance to share any concerns and updates on your health and to receive a thorough exam.  . Visit your dentist for a routine exam and preventive care every 6 months. Brush your teeth twice a day and floss once a day. Good oral hygiene prevents tooth decay and gum disease.  . The frequency of eye exams is based on your age, health, family medical history, use  of contact lenses, and other factors. Follow your health care provider's recommendations for frequency of eye exams.  . Eat a healthy diet. Foods like vegetables, fruits, whole grains, low-fat dairy products, and lean protein foods contain the nutrients you need without too many calories. Decrease your intake of foods high in solid fats, added sugars, and salt. Eat the right amount of  calories for you. Get information about a proper diet from your health care provider, if necessary.  . Regular physical exercise is one of the most important things you can do for your health. Most adults should get at least 150 minutes of moderate-intensity exercise (any activity that increases your heart rate and causes you to sweat) each week. In addition, most adults need muscle-strengthening exercises on 2 or more days a week.  Silver Sneakers may be a benefit available to you. To determine eligibility, you may visit the website: www.silversneakers.com or contact program at [phone] Mon-Fri between 8AM-8PM.   . Maintain a healthy weight. The body mass index (BMI) is a screening tool to identify possible weight problems. It provides an estimate of body fat based on height and weight. Your health care provider can find your BMI and can help you achieve or maintain a healthy weight.   For adults 20 years and older: ? A BMI below 18.5 is considered underweight. ? A BMI of 18.5 to 24.9 is normal. ? A BMI of 25 to 29.9 is considered overweight. ? A BMI of 30 and above is considered obese.   . Maintain normal blood lipids and cholesterol levels by exercising and minimizing your intake of saturated fat. Eat a balanced diet with plenty of fruit and vegetables. Blood tests for lipids and cholesterol should begin at age 61 and be repeated every 5 years.  If your lipid or cholesterol levels are high, you are over 50, or you are at high risk for heart disease, you may need your cholesterol levels checked more frequently. Ongoing high lipid and cholesterol levels should be treated with medicines if diet and exercise are not working.  . If you smoke, find out from your health care provider how to quit. If you do not use tobacco, please do not start.  . If you choose to drink alcohol, please do not consume more than 2 drinks per day. One drink is considered to be 12 ounces (355 mL) of beer, 5 ounces  (148 mL) of wine, or 1.5 ounces (44 mL) of liquor.  . If you are 35-2 years old, ask your health care provider if you should take aspirin to prevent strokes.  . Use sunscreen. Apply sunscreen liberally and repeatedly throughout the day. You should seek shade when your shadow is shorter than you. Protect yourself by wearing long sleeves, pants, a wide-brimmed hat, and sunglasses year round, whenever you are outdoors.  . Once a month, do a whole body skin exam, using a mirror to look at the skin on your back. Tell your health care provider of new moles, moles that have irregular borders, moles that are larger than a pencil eraser, or moles that have changed in shape or color.

## 2017-06-01 NOTE — Progress Notes (Signed)
PCP notes:   Health maintenance:  Foot exam- PCP please address at next appt Eye Exam - addressed; per pt, exam in Sept 2018  Abnormal screenings:   Hearing - failed  Hearing Screening   125Hz  250Hz  500Hz  1000Hz  2000Hz  3000Hz  4000Hz  6000Hz  8000Hz   Right ear:   40 40 40  40    Left ear:   0 40 40  0     Patient concerns:   None  Nurse concerns:  None  Next PCP appt:   07/05/17 @ 1515

## 2017-06-01 NOTE — Progress Notes (Signed)
I reviewed health advisor's note, was available for consultation, and agree with documentation and plan.  

## 2017-06-03 ENCOUNTER — Encounter: Payer: Medicare PPO | Admitting: Family Medicine

## 2017-06-04 ENCOUNTER — Emergency Department: Payer: Medicare PPO

## 2017-06-04 ENCOUNTER — Other Ambulatory Visit: Payer: Self-pay

## 2017-06-04 ENCOUNTER — Encounter: Payer: Self-pay | Admitting: Emergency Medicine

## 2017-06-04 ENCOUNTER — Emergency Department
Admission: EM | Admit: 2017-06-04 | Discharge: 2017-06-04 | Disposition: A | Discharge status: Home / Self Care | Payer: Medicare PPO | Attending: Emergency Medicine | Admitting: Emergency Medicine

## 2017-06-04 DIAGNOSIS — R002 Palpitations: Secondary | ICD-10-CM

## 2017-06-04 DIAGNOSIS — E119 Type 2 diabetes mellitus without complications: Secondary | ICD-10-CM | POA: Diagnosis not present

## 2017-06-04 DIAGNOSIS — Z79899 Other long term (current) drug therapy: Secondary | ICD-10-CM | POA: Insufficient documentation

## 2017-06-04 DIAGNOSIS — M069 Rheumatoid arthritis, unspecified: Secondary | ICD-10-CM | POA: Insufficient documentation

## 2017-06-04 DIAGNOSIS — R000 Tachycardia, unspecified: Secondary | ICD-10-CM | POA: Diagnosis not present

## 2017-06-04 DIAGNOSIS — J45909 Unspecified asthma, uncomplicated: Secondary | ICD-10-CM | POA: Insufficient documentation

## 2017-06-04 LAB — BASIC METABOLIC PANEL
Anion gap: 9 (ref 5–15)
BUN: 28 mg/dL — ABNORMAL HIGH (ref 6–20)
CALCIUM: 8.9 mg/dL (ref 8.9–10.3)
CO2: 24 mmol/L (ref 22–32)
CREATININE: 0.76 mg/dL (ref 0.44–1.00)
Chloride: 108 mmol/L (ref 101–111)
GFR calc non Af Amer: >60 mL/min (ref >60)
Glucose, Bld: 131 mg/dL — ABNORMAL HIGH (ref 65–99)
Potassium: 3.9 mmol/L (ref 3.5–5.1)
SODIUM: 141 mmol/L (ref 135–145)

## 2017-06-04 LAB — CBC
HCT: 45 % (ref 35.0–47.0)
Hemoglobin: 15.1 g/dL (ref 12.0–16.0)
MCH: 31.7 pg (ref 26.0–34.0)
MCHC: 33.4 g/dL (ref 32.0–36.0)
MCV: 94.8 fL (ref 80.0–100.0)
PLATELETS: 219 10*3/uL (ref 150–440)
RBC: 4.75 MIL/uL (ref 3.80–5.20)
RDW: 13.3 % (ref 11.5–14.5)
WBC: 5.9 10*3/uL (ref 3.6–11.0)

## 2017-06-04 LAB — TROPONIN I: Troponin I: <0.03 ng/mL (ref <0.03)

## 2017-06-04 NOTE — Discharge Instructions (Signed)
As we discussed, your workup today was reassuring.  Though we do not know exactly what is causing your symptoms, it appears that you have no emergent medical condition at this time and that you are safe to go home and follow up as recommended in this paperwork.  In addition to the Palpitations discharge information, at your request we also provided some information to read about Panic Attacks and how they may be causing or contributing to your symptoms.  Please return immediately to the Emergency Department if you develop any new or worsening symptoms that concern you.

## 2017-06-04 NOTE — ED Notes (Signed)
Pt reporting tachycardia felt at home that woke pt up out of sleep accompanied by chest and back pain. Pt denies pain at this time, HR in 60's.

## 2017-06-04 NOTE — ED Notes (Signed)
This RN to bedside, introduced self to patient and family at bedside. 2nd troponin collected at this time. Pt is noted to be alert and oriented at this time, NAD noted. Pt resting in bed reading a book. Explained to patient would wait for results and plan of care would be updated based on results. Pt states understanding. Pt denies any needs at this time.

## 2017-06-04 NOTE — ED Triage Notes (Signed)
Pt arrives POV and ambulatory to triage with c/o tachycardia. Pt reports feeling better at this time but states that she has had some discomfort since 0030. Pt is in NAD at this time and is being followed by a cardiologist.

## 2017-06-04 NOTE — ED Notes (Signed)
NAD noted at time of D/C. Pt denies questions or concerns. Pt ambulatory to the lobby at this time. Pt refused wheelchair to the lobby.  

## 2017-06-04 NOTE — ED Provider Notes (Signed)
[facility] Emergency Department Provider Note  ____________________________________________   First MD Initiated Contact with Patient 06/04/17 [phone]     (approximate)  I have reviewed the triage vital signs and the nursing notes.   HISTORY  Chief Complaint Tachycardia    HPI Cheryl Joseph is a 76 y.o. female who presents by private vehicle for evaluation of palpitations and feelings of rapid heartbeat.  She reports that this is happened to her recently but it was much stronger the last time.  It seems to happen at night and does not feel painful and is not a sensation of pressure, but rather of a fast and pounding heartbeat.  She is not short of breath and has no nausea or vomiting.  Nothing in particular makes it better or worse.  Was severe the first time and moderate at this time.  It did cause her to wake from sleep tonight.  After the first episode she went to see Dr. Clayborn Bigness.  She wore a heart monitor but does not yet have the results.  He told her that if it happened again she should come to the emergency department to get an EKG while it is happening.  Even though her symptoms were milder this time she thought she should be checked out, but before arriving to the emergency department the symptoms subsided.  She has not had a recurrence since being in the emergency department for more than 3 hours.  She denies fever/chills, chest pain, chest pressure, shortness of breath, nausea, vomiting, abdominal pain.  Past Medical History:  Diagnosis Date  . Anginal pain ([place])   . Arthritis    RA hands(seronegative)  . Asthma   . Back pain   . Diabetes mellitus without complication ([place])   . HPV in female 1996   HPV with colposcopy (all neg paps since)  . Hyperlipidemia   . Obesity   . Osteopenia   . Shortness of breath dyspnea   . Shoulder pain     Patient Active Problem List   Diagnosis Date Noted  . Respiratory infection 01/23/2017  . Estrogen  deficiency 05/25/2016  . Skin lesion of left arm 05/25/2016  . Multiple complaints 12/19/2015  . Routine general medical examination at a health care facility 11/23/2015  . Hammertoe 10/28/2015  . Corns and callosities 10/28/2015  . Frequent loose stools 02/14/2015  . Diverticulosis of colon without hemorrhage 02/14/2015  . Colon cancer screening 08/19/2014  . Labile blood pressure 02/25/2014  . Encounter for Medicare annual wellness exam 07/16/2013  . Diabetes type 2, controlled ([place]) 05/14/2009  . RHEUMATOID ARTHRITIS 05/13/2008  . ELEVATED BP W/O HYPERTENSION 05/13/2008  . Osteopenia 03/07/2007  . Hyperlipidemia 01/03/2007  . Obesity 01/03/2007  . ALLERGIC RHINITIS, SEASONAL 01/03/2007  . ASTHMA 01/03/2007  . OVERACTIVE BLADDER 01/03/2007    Past Surgical History:  Procedure Laterality Date  . EYE SURGERY    . pelvic organ prolapse surgery   2015  . TONSILLECTOMY    . VAGINAL HYSTERECTOMY  2015   with pelvic organ prolapse surgery     Prior to Admission medications   Medication Sig Start Date End Date Taking? Authorizing Provider  Acetaminophen (TYLENOL EXTRA STRENGTH PO) Take 1 capsule 2 (two) times daily by mouth.    [provider]  b complex vitamins tablet Take 1 tablet by mouth daily. Reported on 12/18/2015    [provider]  Calcium Carbonate-Vitamin D (CALCIUM-VITAMIN D3 PO) Take 1 capsule by mouth daily. Reported  on 12/18/2015    [provider]  Cholecalciferol (VITAMIN D3 PO) Take 5,000 Units daily by mouth.    [provider]  glucosamine-chondroitin 500-400 MG tablet Take 1 tablet by mouth daily. Reported on 12/18/2015    [provider]  Hyaluronic Acid-Vitamin C (HYALURONIC ACID PO) Take 1 tablet by mouth daily. Reported on 12/18/2015    [provider]  IBUPROFEN PO Take 1 capsule 2 (two) times daily by mouth.    [provider]  montelukast (SINGULAIR) 10 MG tablet TAKE ONE TABLET BY MOUTH AT  BEDTIME 01/17/17   Tower, Wynelle Fanny, MD  Multiple Vitamin (MULTIVITAMIN) capsule Take 1 capsule by mouth daily. Reported on 12/18/2015    [provider]  Omega-3 Fatty Acids (FISH OIL PO) Take 1 capsule by mouth daily. Reported on 12/18/2015    [provider]  oxybutynin (OXYTROL) 3.9 MG/24HR Place 1 patch onto the skin every 3 (three) days. 06/12/14   Tower, Wynelle Fanny, MD  Probiotic Product (PROBIOTIC DAILY PO) Take 1 tablet by mouth daily.    [provider]  PROVENTIL HFA 108 (90 Base) MCG/ACT inhaler INHALE TWO PUFFS BY MOUTH EVERY 4 HOURS AS NEEDED FOR WHEEZING. AND 2 PUFFS BEFORE EXERCISE OR EXPOSURE TO COLD AIR 03/31/16   Tower, Wynelle Fanny, MD  simvastatin (ZOCOR) 40 MG tablet TAKE ONE TABLET BY MOUTH AT BEDTIME 03/15/17   Tower, Wynelle Fanny, MD  TURMERIC PO Take 1 capsule daily by mouth.    [provider]  Ubiquinol 100 MG CAPS Take 1 capsule by mouth 3 (three) times a week. Reported on 12/18/2015    [provider]    Allergies Ezetimibe-simvastatin; Tolterodine tartrate; and Vesicare [solifenacin succinate]  Family History  Problem Relation Age of Onset  . Diabetes Mother   . Hyperlipidemia Mother   . Heart disease Mother   . Hypertension Mother   . Obesity Mother   . Kyphosis Mother   . Heart disease Father   . Hypertension Father   . Cancer Father        lung Cancer smoker  . Diabetes Daughter   . Kyphosis Sister     Social History Social History   Tobacco Use  . Smoking status: Never Smoker  . Smokeless tobacco: Never Used  Substance Use Topics  . Alcohol use: No    Alcohol/week: 0.0 oz  . Drug use: No    Review of Systems Constitutional: No fever/chills Eyes: No visual changes. ENT: No sore throat. Cardiovascular: Sensation of rapid heartbeat and "heart pounding" . denies chest pain and pressure. Respiratory: Denies shortness of breath. Gastrointestinal: No abdominal pain.  No nausea, no vomiting.  No diarrhea.  No  constipation. Genitourinary: Negative for dysuria. Musculoskeletal: Negative for neck pain.  Negative for back pain. Integumentary: Negative for rash. Neurological: Negative for headaches, focal weakness or numbness.   ____________________________________________   PHYSICAL EXAM:  VITAL SIGNS: ED Triage Vitals  Enc Vitals Group     BP 06/04/17 0313 (!) 170/86     Pulse Rate 06/04/17 0313 70     Resp 06/04/17 0313 18     Temp 06/04/17 0313 97.9 F (36.6 C)     Temp Source 06/04/17 0313 Oral     SpO2 06/04/17 0313 96 %     Weight 06/04/17 0317 89.8 kg (198 lb)     Height 06/04/17 0317 1.6 m (5\' 3" )     Head Circumference --      Peak Flow --  Pain Score 06/04/17 0313 4     Pain Loc --      Pain Edu? --      Excl. in [place]? --     Constitutional: Alert and oriented. Well appearing and in no acute distress. Eyes: Conjunctivae are normal.  Head: Atraumatic. Nose: No congestion/rhinnorhea. Mouth/Throat: Mucous membranes are moist. Neck: No stridor.  No meningeal signs.   Cardiovascular: Normal rate, regular rhythm. Good peripheral circulation. Grossly normal heart sounds. Respiratory: Normal respiratory effort.  No retractions. Lungs CTAB. Gastrointestinal: Soft and nontender. No distention.  Musculoskeletal: No lower extremity tenderness nor edema. No gross deformities of extremities. Neurologic:  Normal speech and language. No gross focal neurologic deficits are appreciated.  Skin:  Skin is warm, dry and intact. No rash noted. Psychiatric: Mood and affect are normal. Speech and behavior are normal.  ____________________________________________   LABS (all labs ordered are listed, but only abnormal results are displayed)  Labs Reviewed  BASIC METABOLIC PANEL - Abnormal; Notable for the following components:      Result Value   Glucose, Bld 131 (*)    BUN 28 (*)    All other components within normal limits  CBC  TROPONIN I  TROPONIN I    ____________________________________________  EKG  ED ECG REPORT I, Aveah Castell, the attending physician, personally viewed and interpreted this ECG.  Date: 06/04/2017 EKG Time: 03:17 Rate: 69 Rhythm: normal sinus rhythm QRS Axis: normal Intervals: normal ST/T Wave abnormalities: inverted T wave in lead III, otherwise unremarkable Narrative Interpretation: no evidence of acute ischemia  ____________________________________________  RADIOLOGY   Dg Chest 2 View  Result Date: 06/04/2017 CLINICAL DATA:  Acute onset of generalized chest discomfort and tachycardia. EXAM: CHEST  2 VIEW COMPARISON:  Chest radiograph performed 01/21/2017 FINDINGS: The lungs are well-aerated. Mild chronic peribronchial thickening is noted. There is no evidence of focal opacification, pleural effusion or pneumothorax. The heart is normal in size; the mediastinal contour is within normal limits. No acute osseous abnormalities are seen. IMPRESSION: Mild chronic peribronchial thickening noted.  Lungs otherwise clear. Electronically Signed   By: Garald Balding M.D.   On: 06/04/2017 03:41    ____________________________________________   PROCEDURES  Critical Care performed: No   Procedure(s) performed:   Procedures   ____________________________________________   INITIAL IMPRESSION / ASSESSMENT AND PLAN / ED COURSE  As part of my medical decision making, I reviewed the following data within the [place] History obtained from family, Nursing notes reviewed and incorporated, EKG interpreted , Old chart reviewed and Radiograph reviewed     Differential diagnosis includes, but is not limited to, ACS, aortic dissection, pulmonary embolism, cardiac tamponade, pneumothorax, pneumonia, pericarditis, myocarditis, GI-related causes including esophagitis/gastritis, and musculoskeletal chest wall pain.    However, the patient is very well-appearing and in no acute distress.  Her lab  work is all reassuring with no gross abnormalities, her EKG is normal, and her chest x-ray shows no acute abnormalities.  She has been asymptomatic since prior to her arrival.  Her husband voiced his speculation that perhaps anxiety or panic attacks are causing or contributing to her symptoms, and the patient did acknowledge that her mother died at approximately the same age at this time of year and this fact has been on her mind recently.  She is actively undergoing a cardiac/arrhythmia workup with Dr. Clayborn Bigness.  Since her symptoms awoke her from sleep, I discussed with the patient and her husband my plan to obtain a second troponin to  make sure there is no evidence of demand ischemia or NSTEMI.  They understand and agree with the plan.  If this repeat troponin is negative, she should be fine to follow-up with Dr. Clayborn Bigness.  I gave her my usual and customary return precautions.  I am transferring emergency department care at 7 AM to Dr. Cinda Quest to follow-up on the troponin and discharge the patient if appropriate.  The patient and her husband understand and agree with the plan.    ____________________________________________  FINAL CLINICAL IMPRESSION(S) / ED DIAGNOSES  Final diagnoses:  Palpitations     MEDICATIONS GIVEN DURING THIS VISIT:  Medications - No data to display   ED Discharge Orders    None       Note:  This document was prepared using Dragon voice recognition software and may include unintentional dictation errors.    Hinda Kehr, MD 06/04/17 [phone]

## 2017-06-04 NOTE — ED Provider Notes (Signed)
patient feels well troponin is negative patient only came in to see if we can catch the palpitations that she was feeling on the monitor. She will follow-up with Dr. call wood as planned.   Nena Polio, MD 06/04/17 [phone]

## 2017-06-06 ENCOUNTER — Ambulatory Visit (INDEPENDENT_AMBULATORY_CARE_PROVIDER_SITE_OTHER): Payer: Medicare PPO

## 2017-06-06 ENCOUNTER — Ambulatory Visit: Payer: Medicare PPO | Admitting: Podiatry

## 2017-06-06 ENCOUNTER — Encounter: Payer: Self-pay | Admitting: Podiatry

## 2017-06-06 DIAGNOSIS — M84375D Stress fracture, left foot, subsequent encounter for fracture with routine healing: Secondary | ICD-10-CM

## 2017-06-06 NOTE — Progress Notes (Signed)
She presents today for follow-up of a stress fracture third and fourth met basis. She states this seems to be doing really well.  Objective: Vital signs are stable she is alert and oriented 3 minimal pain on palpation to the third and fourth metatarsals of the left foot. Mild edema. Radiographs taken today do not demonstrate any significant changes in fracture site.  Assessment: Healing fractures third and fourth met basis.  Plan: Continue to wear the Cam Walker for another 2-3 weeks.

## 2017-06-10 DIAGNOSIS — D2262 Melanocytic nevi of left upper limb, including shoulder: Secondary | ICD-10-CM | POA: Diagnosis not present

## 2017-06-10 DIAGNOSIS — D225 Melanocytic nevi of trunk: Secondary | ICD-10-CM | POA: Diagnosis not present

## 2017-06-10 DIAGNOSIS — X32XXXA Exposure to sunlight, initial encounter: Secondary | ICD-10-CM | POA: Diagnosis not present

## 2017-06-10 DIAGNOSIS — D0462 Carcinoma in situ of skin of left upper limb, including shoulder: Secondary | ICD-10-CM | POA: Diagnosis not present

## 2017-06-10 DIAGNOSIS — D2271 Melanocytic nevi of right lower limb, including hip: Secondary | ICD-10-CM | POA: Diagnosis not present

## 2017-06-10 DIAGNOSIS — L570 Actinic keratosis: Secondary | ICD-10-CM | POA: Diagnosis not present

## 2017-06-10 DIAGNOSIS — L821 Other seborrheic keratosis: Secondary | ICD-10-CM | POA: Diagnosis not present

## 2017-06-10 DIAGNOSIS — D485 Neoplasm of uncertain behavior of skin: Secondary | ICD-10-CM | POA: Diagnosis not present

## 2017-07-05 ENCOUNTER — Encounter: Payer: Medicare PPO | Admitting: Family Medicine

## 2017-07-11 ENCOUNTER — Other Ambulatory Visit: Payer: Self-pay | Admitting: Family Medicine

## 2017-07-11 DIAGNOSIS — Z1231 Encounter for screening mammogram for malignant neoplasm of breast: Secondary | ICD-10-CM

## 2017-07-23 ENCOUNTER — Other Ambulatory Visit: Payer: Self-pay | Admitting: Family Medicine

## 2017-08-03 ENCOUNTER — Ambulatory Visit
Admission: RE | Admit: 2017-08-03 | Discharge: 2017-08-03 | Disposition: A | Discharge status: Home / Self Care | Payer: Medicare PPO | Source: Ambulatory Visit | Attending: Family Medicine | Admitting: Family Medicine

## 2017-08-03 DIAGNOSIS — Z1231 Encounter for screening mammogram for malignant neoplasm of breast: Secondary | ICD-10-CM | POA: Diagnosis not present

## 2017-08-05 ENCOUNTER — Ambulatory Visit (INDEPENDENT_AMBULATORY_CARE_PROVIDER_SITE_OTHER): Payer: Medicare PPO | Admitting: Family Medicine

## 2017-08-05 ENCOUNTER — Encounter: Payer: Self-pay | Admitting: Family Medicine

## 2017-08-05 VITALS — BP 140/82 | HR 58 | Temp 97.6°F | Ht 63.0 in | Wt 199.2 lb

## 2017-08-05 DIAGNOSIS — M8580 Other specified disorders of bone density and structure, unspecified site: Secondary | ICD-10-CM

## 2017-08-05 DIAGNOSIS — R030 Elevated blood-pressure reading, without diagnosis of hypertension: Secondary | ICD-10-CM | POA: Diagnosis not present

## 2017-08-05 DIAGNOSIS — Z6836 Body mass index (BMI) 36.0-36.9, adult: Secondary | ICD-10-CM

## 2017-08-05 DIAGNOSIS — E119 Type 2 diabetes mellitus without complications: Secondary | ICD-10-CM | POA: Diagnosis not present

## 2017-08-05 DIAGNOSIS — E1121 Type 2 diabetes mellitus with diabetic nephropathy: Secondary | ICD-10-CM | POA: Diagnosis not present

## 2017-08-05 DIAGNOSIS — Z0000 Encounter for general adult medical examination without abnormal findings: Secondary | ICD-10-CM

## 2017-08-05 DIAGNOSIS — E7800 Pure hypercholesterolemia, unspecified: Secondary | ICD-10-CM

## 2017-08-05 DIAGNOSIS — M059 Rheumatoid arthritis with rheumatoid factor, unspecified: Secondary | ICD-10-CM | POA: Diagnosis not present

## 2017-08-05 MED ORDER — SIMVASTATIN 40 MG PO TABS: 40.0000 mg | ORAL_TABLET | Freq: Every day | ORAL | 3 refills | Status: DC

## 2017-08-05 MED ORDER — ALENDRONATE SODIUM 70 MG PO TABS: 70.0000 mg | ORAL_TABLET | ORAL | 11 refills | Status: DC

## 2017-08-05 MED ORDER — MONTELUKAST SODIUM 10 MG PO TABS: 10.0000 mg | ORAL_TABLET | Freq: Every day | ORAL | 3 refills | Status: DC

## 2017-08-05 NOTE — Assessment & Plan Note (Signed)
Lab Results  Component Value Date   HGBA1C 5.8 05/27/2017   No longer on metformin  Doing well with better habits  Disc low glycemic diet  Disc eye and foot care  Pt resistant to bp med/ace - will disc at 1 mo f/u

## 2017-08-05 NOTE — Assessment & Plan Note (Signed)
Mild  Last seen by Dr Jefm Bryant in 09 No medications  Was rf pos-then neg

## 2017-08-05 NOTE — Assessment & Plan Note (Signed)
Disc goals for lipids and reasons to control them Rev labs with pt Rev low sat fat diet in detail Statin and diet

## 2017-08-05 NOTE — Assessment & Plan Note (Signed)
Reviewed health habits including diet and exercise and skin cancer prevention Reviewed appropriate screening tests for age  Also reviewed health mt list, fam hx and immunization status , as well as social and family history   See HPI Labs reviewed  Will begin alendronate for osteopenia  amw reviewed  F/u for elevated bp 1 mo -given dash handout

## 2017-08-05 NOTE — Assessment & Plan Note (Signed)
Now with 2 stress fx of foot this year Rev last dexa Will begin alendronate 70 mg weekly  Rev side eff and will hold and update if problems  Disc ca and D and exercise

## 2017-08-05 NOTE — Assessment & Plan Note (Signed)
BP: 140/82  F/u 1 mo for re check after lifestyle change Resistant to medication Ace would be opt due to DM

## 2017-08-05 NOTE — Progress Notes (Signed)
Subjective:    Patient ID: Cheryl Joseph, female    DOB: [birth year], 77 y.o.   MRN: [id number]  HPI Here for health maintenance exam and to review chronic medical problems   Feeling so/so  A lot of stress  Prays and talks to her husband (who happens to be a Social worker)  Holidays were hard- lost 2 friends   A lot of stress with her daughter -caring for her (morbidly obese with many health problems)  Husbands kids had to stay with them   Having heart issues  She sees Dr Clayborn Bigness  Palpitations- given a monitor  Thinks it is from stress   Wt Readings from Last 3 Encounters:  08/05/17 199 lb 4 oz (90.4 kg)  06/04/17 198 lb (89.8 kg)  06/01/17 198 lb (89.8 kg)  stable weight  35.30 kg/m   Had amw on 11/7 Missed high and low tones in L ear on screening - not bothering her  No concerns   Mammogram 12/17 neg Self breast exam   Eye exam 9/18  dexa 1/17 stable osteopenia  Stress fractures in L foot -in a boot for 2 mo (Dr Milinda Pointer)  From hiking  Takes her ca and D  zostavax 09  Colonoscopy 10/16 with diverticulosis   Blood pressure-stressed today  BP Readings from Last 3 Encounters:  08/05/17 140/82  06/04/17 135/77  06/01/17 122/78    DM2 Lab Results  Component Value Date   HGBA1C 5.8 05/27/2017   Improved with lifestyle habits  Eye exam 9/18 No renal protection  prevon metformin   Cholesterol Lab Results  Component Value Date   CHOL 183 05/27/2017   CHOL 201 (H) 05/19/2016   CHOL 181 08/14/2014   Lab Results  Component Value Date   HDL 48.70 05/27/2017   HDL 46.40 05/19/2016   HDL 44.00 08/14/2014   Lab Results  Component Value Date   LDLCALC 102 (H) 08/14/2014   LDLCALC 107 (H) 07/11/2013   LDLCALC 112 (H) 06/16/2012   Lab Results  Component Value Date   TRIG 202.0 (H) 05/27/2017   TRIG 226.0 (H) 05/19/2016   TRIG 176.0 (H) 08/14/2014   Lab Results  Component Value Date   CHOLHDL 4 05/27/2017   CHOLHDL 4 05/19/2016   CHOLHDL 4  08/14/2014   Lab Results  Component Value Date   LDLDIRECT 114.0 05/27/2017   LDLDIRECT 132.0 05/19/2016   LDLDIRECT 105.9 12/26/2012   Simvastatin and diet  LDL is slt improved   Lab Results  Component Value Date   WBC 5.9 06/04/2017   HGB 15.1 06/04/2017   HCT 45.0 06/04/2017   MCV 94.8 06/04/2017   PLT 219 06/04/2017   Lab Results  Component Value Date   CREATININE 0.76 06/04/2017   BUN 28 (H) 06/04/2017   NA 141 06/04/2017   K 3.9 06/04/2017   CL 108 06/04/2017   CO2 24 06/04/2017   Lab Results  Component Value Date   ALT 13 05/27/2017   AST 17 05/27/2017   ALKPHOS 61 05/27/2017   BILITOT 0.6 05/27/2017     Lab Results  Component Value Date   TSH 1.67 05/27/2017    Patient Active Problem List   Diagnosis Date Noted  . Estrogen deficiency 05/25/2016  . Skin lesion of left arm 05/25/2016  . Routine general medical examination at a health care facility 11/23/2015  . Hammertoe 10/28/2015  . Frequent loose stools 02/14/2015  . Diverticulosis of colon without hemorrhage 02/14/2015  . Colon cancer  screening 08/19/2014  . Labile blood pressure 02/25/2014  . Encounter for Medicare annual wellness exam 07/16/2013  . Diabetes type 2, controlled ([place]) 05/14/2009  . Rheumatoid arthritis ([place]) 05/13/2008  . ELEVATED BP W/O HYPERTENSION 05/13/2008  . Osteopenia 03/07/2007  . Hyperlipidemia 01/03/2007  . Obesity 01/03/2007  . ALLERGIC RHINITIS, SEASONAL 01/03/2007  . ASTHMA 01/03/2007  . OVERACTIVE BLADDER 01/03/2007   Past Medical History:  Diagnosis Date  . Anginal pain ([place])   . Arthritis    RA hands(seronegative)  . Asthma   . Back pain   . Diabetes mellitus without complication ([place])   . HPV in female 1996   HPV with colposcopy (all neg paps since)  . Hyperlipidemia   . Obesity   . Osteopenia   . Shortness of breath dyspnea   . Shoulder pain    Past Surgical History:  Procedure Laterality Date  . COLONOSCOPY WITH PROPOFOL N/A 05/01/2015    Procedure: COLONOSCOPY WITH PROPOFOL;  Surgeon: Manya Silvas, MD;  Location[facility] END[facility]COPY;  Service: Endoscopy;  Laterality: N/A;  . EYE SURGERY    . pelvic organ prolapse surgery   2015  . TONSILLECTOMY    . VAGINAL HYSTERECTOMY  2015   with pelvic organ prolapse surgery    Social History   Tobacco Use  . Smoking status: Never Smoker  . Smokeless tobacco: Never Used  Substance Use Topics  . Alcohol use: No    Alcohol/week: 0.0 oz  . Drug use: No   Family History  Problem Relation Age of Onset  . Diabetes Mother   . Hyperlipidemia Mother   . Heart disease Mother   . Hypertension Mother   . Obesity Mother   . Kyphosis Mother   . Heart disease Father   . Hypertension Father   . Cancer Father        lung Cancer smoker  . Diabetes Daughter   . Kyphosis Sister    Allergies  Allergen Reactions  . Ezetimibe-Simvastatin     REACTION: leg pain  . Tolterodine Tartrate     REACTION: side effects  . Vesicare [Solifenacin Succinate]     Eye problems    Current Outpatient Medications on File Prior to Visit  Medication Sig Dispense Refill  . Acetaminophen (TYLENOL EXTRA STRENGTH PO) Take 1 capsule 2 (two) times daily by mouth.    Marland Kitchen b complex vitamins tablet Take 1 tablet by mouth daily. Reported on 12/18/2015    . Calcium Carbonate-Vitamin D (CALCIUM-VITAMIN D3 PO) Take 1 capsule by mouth daily. Reported on 12/18/2015    . Cholecalciferol (VITAMIN D3 PO) Take 5,000 Units daily by mouth.    Marland Kitchen glucosamine-chondroitin 500-400 MG tablet Take 1 tablet by mouth daily. Reported on 12/18/2015    . Hyaluronic Acid-Vitamin C (HYALURONIC ACID PO) Take 1 tablet by mouth daily. Reported on 12/18/2015    . IBUPROFEN PO Take 1 capsule 2 (two) times daily by mouth.    . Multiple Vitamin (MULTIVITAMIN) capsule Take 1 capsule by mouth daily. Reported on 12/18/2015    . Omega-3 Fatty Acids (FISH OIL PO) Take 1 capsule by mouth daily. Reported on 12/18/2015    . oxybutynin (OXYTROL) 3.9 MG/24HR  Place 1 patch onto the skin every 3 (three) days. 8 patch 12  . Probiotic Product (PROBIOTIC DAILY PO) Take 1 tablet by mouth daily.    Marland Kitchen PROVENTIL HFA 108 (90 Base) MCG/ACT inhaler INHALE TWO PUFFS BY MOUTH EVERY 4 HOURS AS NEEDED FOR WHEEZING. AND 2 PUFFS  BEFORE EXERCISE OR EXPOSURE TO COLD AIR 1 Inhaler 2  . TURMERIC PO Take 1 capsule daily by mouth.    . Ubiquinol 100 MG CAPS Take 1 capsule by mouth 3 (three) times a week. Reported on 12/18/2015     No current facility-administered medications on file prior to visit.     Review of Systems  Constitutional: Positive for fatigue. Negative for activity change, appetite change, fever and unexpected weight change.  HENT: Negative for congestion, ear pain, rhinorrhea, sinus pressure and sore throat.   Eyes: Negative for pain, redness and visual disturbance.  Respiratory: Negative for cough, shortness of breath and wheezing.   Cardiovascular: Negative for chest pain and palpitations.  Gastrointestinal: Negative for abdominal pain, blood in stool, constipation and diarrhea.  Endocrine: Negative for polydipsia and polyuria.  Genitourinary: Negative for dysuria, frequency and urgency.  Musculoskeletal: Negative for arthralgias, back pain and myalgias.  Skin: Negative for pallor and rash.  Allergic/Immunologic: Negative for environmental allergies.  Neurological: Negative for dizziness, syncope and headaches.  Hematological: Negative for adenopathy. Does not bruise/bleed easily.  Psychiatric/Behavioral: Negative for decreased concentration and dysphoric mood. The patient is nervous/anxious.        Pos for stressors        Objective:   Physical Exam  Constitutional: She appears well-developed and well-nourished. No distress.  obese and well appearing   HENT:  Head: Normocephalic and atraumatic.  Right Ear: External ear normal.  Left Ear: External ear normal.  Mouth/Throat: Oropharynx is clear and moist.  Eyes: Conjunctivae and EOM are  normal. Pupils are equal, round, and reactive to light. No scleral icterus.  Neck: Normal range of motion. Neck supple. No JVD present. Carotid bruit is not present. No thyromegaly present.  Cardiovascular: Normal rate, regular rhythm, normal heart sounds and intact distal pulses. Exam reveals no gallop.  Pulmonary/Chest: Effort normal and breath sounds normal. No respiratory distress. She has no wheezes. She exhibits no tenderness.  Abdominal: Soft. Bowel sounds are normal. She exhibits no distension, no abdominal bruit and no mass. There is no tenderness.  Genitourinary: No breast swelling, tenderness, discharge or bleeding.  Genitourinary Comments: Breast exam: No mass, nodules, thickening, tenderness, bulging, retraction, inflamation, nipple discharge or skin changes noted.  No axillary or clavicular LA.      Musculoskeletal: Normal range of motion. She exhibits no edema or tenderness.  No acute joint changes   Lymphadenopathy:    She has no cervical adenopathy.  Neurological: She is alert. She has normal reflexes. No cranial nerve deficit. She exhibits normal muscle tone. Coordination normal.  Skin: Skin is warm and dry. No rash noted. No erythema. No pallor.  Solar lentigines diffusely   Psychiatric: She has a normal mood and affect.          Assessment & Plan:   Problem List Items Addressed This Visit      Endocrine   Diabetes type 2, controlled ([place]) - Primary    Lab Results  Component Value Date   HGBA1C 5.8 05/27/2017   No longer on metformin  Doing well with better habits  Disc low glycemic diet  Disc eye and foot care  Pt resistant to bp med/ace - will disc at 1 mo f/u      Relevant Medications   simvastatin (ZOCOR) 40 MG tablet     Musculoskeletal and Integument   Osteopenia    Now with 2 stress fx of foot this year Rev last dexa Will begin alendronate 70 mg weekly  Rev side eff and will hold and update if problems  Disc ca and D and exercise        Rheumatoid arthritis (HCC)    Mild  Last seen by Dr Jefm Bryant in 09 No medications  Was rf pos-then neg         Other   ELEVATED BP W/O HYPERTENSION    BP: 140/82  F/u 1 mo for re check after lifestyle change Resistant to medication Ace would be opt due to DM      Hyperlipidemia    Disc goals for lipids and reasons to control them Rev labs with pt Rev low sat fat diet in detail Statin and diet       Relevant Medications   simvastatin (ZOCOR) 40 MG tablet   Obesity    Discussed how this problem influences overall health and the risks it imposes  Reviewed plan for weight loss with lower calorie diet (via better food choices and also portion control or program like weight watchers) and exercise building up to or more than 30 minutes 5 days per week including some aerobic activity         Routine general medical examination at a health care facility    Reviewed health habits including diet and exercise and skin cancer prevention Reviewed appropriate screening tests for age  Also reviewed health mt list, fam hx and immunization status , as well as social and family history   See HPI Labs reviewed  Will begin alendronate for osteopenia  amw reviewed  F/u for elevated bp 1 mo -given dash handout

## 2017-08-05 NOTE — Assessment & Plan Note (Signed)
Discussed how this problem influences overall health and the risks it imposes  Reviewed plan for weight loss with lower calorie diet (via better food choices and also portion control or program like weight watchers) and exercise building up to or more than 30 minutes 5 days per week including some aerobic activity    

## 2017-08-05 NOTE — Patient Instructions (Addendum)
Calcium we recommend 600 mg twice daily  Vitamin D - continue the 5000 iu   Start the alendronate as directed to prevent more fractures  If any side effects please let me know   Work on self care Keep using the exercise bike  Eat a healthy diet   Follow up in about a month for blood pressure

## 2017-08-08 ENCOUNTER — Ambulatory Visit: Payer: Medicare PPO | Admitting: Podiatry

## 2017-08-19 ENCOUNTER — Emergency Department
Admission: EM | Admit: 2017-08-19 | Discharge: 2017-08-19 | Disposition: A | Discharge status: Home / Self Care | Payer: Medicare PPO | Attending: Emergency Medicine | Admitting: Emergency Medicine

## 2017-08-19 ENCOUNTER — Encounter: Payer: Self-pay | Admitting: Emergency Medicine

## 2017-08-19 ENCOUNTER — Other Ambulatory Visit: Payer: Self-pay

## 2017-08-19 ENCOUNTER — Emergency Department: Payer: Medicare PPO

## 2017-08-19 DIAGNOSIS — R079 Chest pain, unspecified: Secondary | ICD-10-CM | POA: Diagnosis not present

## 2017-08-19 DIAGNOSIS — J45909 Unspecified asthma, uncomplicated: Secondary | ICD-10-CM | POA: Diagnosis not present

## 2017-08-19 DIAGNOSIS — E119 Type 2 diabetes mellitus without complications: Secondary | ICD-10-CM | POA: Insufficient documentation

## 2017-08-19 DIAGNOSIS — R002 Palpitations: Secondary | ICD-10-CM | POA: Insufficient documentation

## 2017-08-19 LAB — CBC
HCT: 44.5 % (ref 35.0–47.0)
HEMOGLOBIN: 14.7 g/dL (ref 12.0–16.0)
MCH: 31.4 pg (ref 26.0–34.0)
MCHC: 33.1 g/dL (ref 32.0–36.0)
MCV: 94.9 fL (ref 80.0–100.0)
Platelets: 238 10*3/uL (ref 150–440)
RBC: 4.69 MIL/uL (ref 3.80–5.20)
RDW: 13.2 % (ref 11.5–14.5)
WBC: 6.8 10*3/uL (ref 3.6–11.0)

## 2017-08-19 LAB — BASIC METABOLIC PANEL
Anion gap: 8 (ref 5–15)
BUN: 30 mg/dL — AB (ref 6–20)
CALCIUM: 9.3 mg/dL (ref 8.9–10.3)
CO2: 28 mmol/L (ref 22–32)
CREATININE: 0.82 mg/dL (ref 0.44–1.00)
Chloride: 108 mmol/L (ref 101–111)
GFR calc non Af Amer: >60 mL/min (ref >60)
Glucose, Bld: 105 mg/dL — ABNORMAL HIGH (ref 65–99)
POTASSIUM: 4 mmol/L (ref 3.5–5.1)
Sodium: 144 mmol/L (ref 135–145)

## 2017-08-19 LAB — TSH: TSH: 1.59 u[IU]/mL (ref 0.350–4.500)

## 2017-08-19 LAB — TROPONIN I

## 2017-08-19 MED ORDER — LORAZEPAM 1 MG PO TABS: 1.0000 mg | ORAL_TABLET | ORAL | 0 refills | Status: AC | PRN

## 2017-08-19 NOTE — ED Triage Notes (Signed)
C/o heart palpitations since 1600 today.  Also c/o some chest pain.

## 2017-08-19 NOTE — ED Provider Notes (Signed)
[facility] Emergency Department Provider Note       Time seen: ----------------------------------------- 5:54 PM on 08/19/2017 -----------------------------------------   I have reviewed the triage vital signs and the nursing notes.  HISTORY   Chief Complaint Palpitations    HPI Cheryl Joseph is a 77 y.o. female with a history of asthma, back pain, diabetes, hyperlipidemia who presents to the ED for palpitations since 4:00 today.  Patient states it lasted for about an hour.  She has had this happen in the past and ended up wearing a Holter monitor which did not discover the event.  She has also followed up with cardiology.  She is concerned she may be developing intermittent atrial fibrillation.  She denies any recent illness.  Family notes that this is worse when she is under stress.  Past Medical History:  Diagnosis Date  . Anginal pain ([place])   . Arthritis    RA hands(seronegative)  . Asthma   . Back pain   . Diabetes mellitus without complication ([place])   . HPV in female 1996   HPV with colposcopy (all neg paps since)  . Hyperlipidemia   . Obesity   . Osteopenia   . Shortness of breath dyspnea   . Shoulder pain     Patient Active Problem List   Diagnosis Date Noted  . Estrogen deficiency 05/25/2016  . Skin lesion of left arm 05/25/2016  . Routine general medical examination at a health care facility 11/23/2015  . Hammertoe 10/28/2015  . Frequent loose stools 02/14/2015  . Diverticulosis of colon without hemorrhage 02/14/2015  . Colon cancer screening 08/19/2014  . Labile blood pressure 02/25/2014  . Encounter for Medicare annual wellness exam 07/16/2013  . Diabetes type 2, controlled ([place]) 05/14/2009  . Rheumatoid arthritis ([place]) 05/13/2008  . ELEVATED BP W/O HYPERTENSION 05/13/2008  . Osteopenia 03/07/2007  . Hyperlipidemia 01/03/2007  . Obesity 01/03/2007  . ALLERGIC RHINITIS, SEASONAL 01/03/2007  . ASTHMA 01/03/2007  .  OVERACTIVE BLADDER 01/03/2007    Past Surgical History:  Procedure Laterality Date  . COLONOSCOPY WITH PROPOFOL N/A 05/01/2015   Procedure: COLONOSCOPY WITH PROPOFOL;  Surgeon: Manya Silvas, MD;  Location: [facility] ENDOSCOPY;  Service: Endoscopy;  Laterality: N/A;  . EYE SURGERY    . pelvic organ prolapse surgery   2015  . TONSILLECTOMY    . VAGINAL HYSTERECTOMY  2015   with pelvic organ prolapse surgery     Allergies Ezetimibe-simvastatin; Tolterodine tartrate; and Vesicare [solifenacin succinate]  Social History Social History   Tobacco Use  . Smoking status: Never Smoker  . Smokeless tobacco: Never Used  Substance Use Topics  . Alcohol use: No    Alcohol/week: 0.0 oz  . Drug use: No    Review of Systems Constitutional: Negative for fever. Cardiovascular: Negative for chest pain.  Positive for palpitations Respiratory: Negative for shortness of breath. Gastrointestinal: Negative for abdominal pain, vomiting and diarrhea. Musculoskeletal: Negative for back pain. Skin: Negative for rash. Neurological: Negative for headaches, focal weakness or numbness.  All systems negative/normal/unremarkable except as stated in the HPI  ____________________________________________   PHYSICAL EXAM:  VITAL SIGNS: ED Triage Vitals  Enc Vitals Group     BP 08/19/17 1723 (!) 189/60     Pulse Rate 08/19/17 1723 65     Resp 08/19/17 1723 18     Temp 08/19/17 1723 98.1 F (36.7 C)     Temp Source 08/19/17 1723 Oral     SpO2 08/19/17 1723 98 %  Weight 08/19/17 1719 199 lb (90.3 kg)     Height 08/19/17 1719 5\' 3"  (1.6 m)     Head Circumference --      Peak Flow --      Pain Score 08/19/17 1718 5     Pain Loc --      Pain Edu? --      Excl. in [place]? --    Constitutional: Alert and oriented. Well appearing and in no distress. Eyes: Conjunctivae are normal. Normal extraocular movements. ENT   Head: Normocephalic and atraumatic.   Nose: No congestion/rhinnorhea.    Mouth/Throat: Mucous membranes are moist.   Neck: No stridor. Cardiovascular: Normal rate, regular rhythm. No murmurs, rubs, or gallops. Respiratory: Normal respiratory effort without tachypnea nor retractions. Breath sounds are clear and equal bilaterally. No wheezes/rales/rhonchi. Gastrointestinal: Soft and nontender. Normal bowel sounds Musculoskeletal: Nontender with normal range of motion in extremities. No lower extremity tenderness nor edema. Neurologic:  Normal speech and language. No gross focal neurologic deficits are appreciated.  Skin:  Skin is warm, dry and intact. No rash noted. Psychiatric: Mood and affect are normal. Speech and behavior are normal.  ____________________________________________  EKG: Interpreted by me.  This rhythm the rate of 65 bpm, normal PR interval, normal QRS, normal QT.  ____________________________________________  ED COURSE:  As part of my medical decision making, I reviewed the following data within the [place] History obtained from family if available, nursing notes, old chart and ekg, as well as notes from prior ED visits. Patient presented for palpitations, we will assess with labs and imaging as indicated at this time.   Procedures ____________________________________________   LABS (pertinent positives/negatives)  Labs Reviewed  BASIC METABOLIC PANEL - Abnormal; Notable for the following components:      Result Value   Glucose, Bld 105 (*)    BUN 30 (*)    All other components within normal limits  CBC  TROPONIN I  TSH    RADIOLOGY  Chest x-ray is normal  ____________________________________________  DIFFERENTIAL DIAGNOSIS   Arrhythmia, anxiety, hyperthyroidism, PACs, PVCs  FINAL ASSESSMENT AND PLAN  Palpitations   Plan: Patient had presented for palpitations. Patient's labs are unremarkable. Patient's imaging was also unremarkable.  I have observed her in the ER for about an hour without any  ectopy.  I am not sure the exact etiology for this, I will try Ativan and outpatient follow-up with her cardiologist.   Earleen Newport, MD   Note: This note was generated in part or whole with voice recognition software. Voice recognition is usually quite accurate but there are transcription errors that can and very often do occur. I apologize for any typographical errors that were not detected and corrected.     Earleen Newport, MD 08/19/17 [phone]

## 2017-09-05 ENCOUNTER — Ambulatory Visit: Payer: Medicare PPO | Admitting: Family Medicine

## 2017-09-05 ENCOUNTER — Encounter: Payer: Self-pay | Admitting: Family Medicine

## 2017-09-05 VITALS — BP 120/70 | HR 72 | Temp 97.7°F | Resp 16 | Ht 63.0 in | Wt 201.2 lb

## 2017-09-05 DIAGNOSIS — R030 Elevated blood-pressure reading, without diagnosis of hypertension: Secondary | ICD-10-CM

## 2017-09-05 DIAGNOSIS — F430 Acute stress reaction: Secondary | ICD-10-CM | POA: Insufficient documentation

## 2017-09-05 NOTE — Assessment & Plan Note (Signed)
This likely causes bp spikes  Improved now Good support  Good bp with less anx  Will take ativan only for emergencies Enc good exercise and self care Offered counseling ref in future if needed

## 2017-09-05 NOTE — Assessment & Plan Note (Addendum)
Much improve today with good mood resolution of anxiety symptoms  Reviewed stressors/ coping techniques/symptoms/ support sources/ tx options and side effects in detail today Disc coping tech  Reassuring w/u in ED Reviewed hospital records, lab results and studies in detail   Reassuring exam today  Will continue DASH eating/ wt loss effort and exercise Continue to follow
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
## 2017-09-05 NOTE — Patient Instructions (Signed)
Blood pressure is much improved  Keep working hard on healthy diet and exercise  Only check your blood pressure when you are relaxed   Drink your water  Watch sodium and sugar in diet   Keep working on stress reactions - with your "counselors"

## 2017-10-21 DIAGNOSIS — D0462 Carcinoma in situ of skin of left upper limb, including shoulder: Secondary | ICD-10-CM | POA: Diagnosis not present

## 2017-10-21 DIAGNOSIS — L570 Actinic keratosis: Secondary | ICD-10-CM | POA: Diagnosis not present

## 2017-10-21 DIAGNOSIS — L821 Other seborrheic keratosis: Secondary | ICD-10-CM | POA: Diagnosis not present

## 2017-10-21 DIAGNOSIS — X32XXXA Exposure to sunlight, initial encounter: Secondary | ICD-10-CM | POA: Diagnosis not present

## 2017-12-24 HISTORY — PX: SQUAMOUS CELL CARCINOMA EXCISION: SHX2433

## 2018-01-23 ENCOUNTER — Other Ambulatory Visit: Payer: Self-pay | Admitting: Family Medicine

## 2018-03-02 DIAGNOSIS — E669 Obesity, unspecified: Secondary | ICD-10-CM | POA: Diagnosis not present

## 2018-03-02 DIAGNOSIS — R0602 Shortness of breath: Secondary | ICD-10-CM | POA: Diagnosis not present

## 2018-03-02 DIAGNOSIS — R002 Palpitations: Secondary | ICD-10-CM | POA: Diagnosis not present

## 2018-03-02 DIAGNOSIS — J45909 Unspecified asthma, uncomplicated: Secondary | ICD-10-CM | POA: Diagnosis not present

## 2018-03-02 DIAGNOSIS — E782 Mixed hyperlipidemia: Secondary | ICD-10-CM | POA: Diagnosis not present

## 2018-03-02 DIAGNOSIS — E785 Hyperlipidemia, unspecified: Secondary | ICD-10-CM | POA: Diagnosis not present

## 2018-04-25 DIAGNOSIS — D0462 Carcinoma in situ of skin of left upper limb, including shoulder: Secondary | ICD-10-CM | POA: Diagnosis not present

## 2018-04-25 DIAGNOSIS — D485 Neoplasm of uncertain behavior of skin: Secondary | ICD-10-CM | POA: Diagnosis not present

## 2018-04-25 DIAGNOSIS — L570 Actinic keratosis: Secondary | ICD-10-CM | POA: Diagnosis not present

## 2018-05-02 DIAGNOSIS — L570 Actinic keratosis: Secondary | ICD-10-CM | POA: Diagnosis not present

## 2018-05-02 DIAGNOSIS — L853 Xerosis cutis: Secondary | ICD-10-CM | POA: Diagnosis not present

## 2018-05-02 DIAGNOSIS — X32XXXA Exposure to sunlight, initial encounter: Secondary | ICD-10-CM | POA: Diagnosis not present

## 2018-05-02 DIAGNOSIS — L821 Other seborrheic keratosis: Secondary | ICD-10-CM | POA: Diagnosis not present

## 2018-05-11 ENCOUNTER — Ambulatory Visit: Payer: Medicare PPO

## 2018-05-18 ENCOUNTER — Ambulatory Visit (INDEPENDENT_AMBULATORY_CARE_PROVIDER_SITE_OTHER): Payer: Medicare PPO

## 2018-05-18 DIAGNOSIS — Z23 Encounter for immunization: Secondary | ICD-10-CM | POA: Diagnosis not present

## 2018-06-07 ENCOUNTER — Encounter: Payer: Self-pay | Admitting: Family Medicine

## 2018-06-07 ENCOUNTER — Ambulatory Visit: Payer: Medicare PPO | Admitting: Family Medicine

## 2018-06-07 VITALS — BP 146/78 | HR 62 | Temp 98.5°F | Ht 63.0 in | Wt 201.0 lb

## 2018-06-07 DIAGNOSIS — I10 Essential (primary) hypertension: Secondary | ICD-10-CM | POA: Diagnosis not present

## 2018-06-07 DIAGNOSIS — E7800 Pure hypercholesterolemia, unspecified: Secondary | ICD-10-CM

## 2018-06-07 DIAGNOSIS — Z6835 Body mass index (BMI) 35.0-35.9, adult: Secondary | ICD-10-CM | POA: Diagnosis not present

## 2018-06-07 MED ORDER — AMLODIPINE BESYLATE 5 MG PO TABS
5.0000 mg | ORAL_TABLET | Freq: Every day | ORAL | 11 refills | Status: DC
Start: 2018-06-07 — End: 2018-08-16

## 2018-06-07 NOTE — Progress Notes (Signed)
Subjective:    Patient ID: Cheryl Joseph, female    DOB: 01-16-1941, 77 y.o.   MRN: [id number]  HPI Here for elevated bp   Noticed lately more headaches  Then started checking bp daily   Wt Readings from Last 3 Encounters:  06/07/18 201 lb (91.2 kg)  09/05/17 201 lb 4 oz (91.3 kg)  08/19/17 199 lb (90.3 kg)   35.61 kg/m  BP elevation in the past has been assoc with anxiety and stressors  (very high in ED one time) BP Readings from Last 3 Encounters:  06/07/18 (!) 146/78  09/05/17 120/70  08/19/17 (!) 161/71   At home- checking  120s-140s/60s-70s Pulse in 50s-60s Headaches correlate to the higher readings   Does not use salt  Reads labels- avoids excessive sodium and caffeine   Stress - married and aquired 3 kids and 53 grand kids Her own daughter stresses her a lot  A lot of life changes  She thinks she handles it pretty well   Both parents had HTN   She does still exercise every day    H/o hyperlipidemia Lab Results  Component Value Date   CHOL 183 05/27/2017   HDL 48.70 05/27/2017   LDLCALC 102 (H) 08/14/2014   LDLDIRECT 114.0 05/27/2017   TRIG 202.0 (H) 05/27/2017   CHOLHDL 4 05/27/2017   Lab Results  Component Value Date   CREATININE 0.82 08/19/2017   BUN 30 (H) 08/19/2017   NA 144 08/19/2017   K 4.0 08/19/2017   CL 108 08/19/2017   CO2 28 08/19/2017    Patient Active Problem List   Diagnosis Date Noted  . Stress reaction 09/05/2017  . Estrogen deficiency 05/25/2016  . Skin lesion of left arm 05/25/2016  . Routine general medical examination at a health care facility 11/23/2015  . Hammertoe 10/28/2015  . Frequent loose stools 02/14/2015  . Diverticulosis of colon without hemorrhage 02/14/2015  . Colon cancer screening 08/19/2014  . Essential hypertension 02/25/2014  . Encounter for Medicare annual wellness exam 07/16/2013  . Diabetes type 2, controlled ([place]) 05/14/2009  . Rheumatoid arthritis ([place]) 05/13/2008  . Osteopenia  03/07/2007  . Hyperlipidemia 01/03/2007  . Obesity 01/03/2007  . ALLERGIC RHINITIS, SEASONAL 01/03/2007  . ASTHMA 01/03/2007  . OVERACTIVE BLADDER 01/03/2007   Past Medical History:  Diagnosis Date  . Anginal pain ([place])   . Arthritis    RA hands(seronegative)  . Asthma   . Back pain   . Diabetes mellitus without complication ([place])   . HPV in female 1996   HPV with colposcopy (all neg paps since)  . Hyperlipidemia   . Obesity   . Osteopenia   . Shortness of breath dyspnea   . Shoulder pain    Past Surgical History:  Procedure Laterality Date  . COLONOSCOPY WITH PROPOFOL N/A 05/01/2015   Procedure: COLONOSCOPY WITH PROPOFOL;  Surgeon: Manya Silvas, MD;  Location: [facility];  Service: Endoscopy;  Laterality: N/A;  . EYE SURGERY    . pelvic organ prolapse surgery   2015  . TONSILLECTOMY    . VAGINAL HYSTERECTOMY  2015   with pelvic organ prolapse surgery    Social History   Tobacco Use  . Smoking status: Never Smoker  . Smokeless tobacco: Never Used  Substance Use Topics  . Alcohol use: No    Alcohol/week: 0.0 standard drinks  . Drug use: No   Family History  Problem Relation Age of Onset  . Diabetes Mother   . Hyperlipidemia  Mother   . Heart disease Mother   . Hypertension Mother   . Obesity Mother   . Kyphosis Mother   . Heart disease Father   . Hypertension Father   . Cancer Father        lung Cancer smoker  . Diabetes Daughter   . Kyphosis Sister    Allergies  Allergen Reactions  . Ezetimibe-Simvastatin     REACTION: leg pain  . Tolterodine Tartrate     REACTION: side effects  . Vesicare [Solifenacin Succinate]     Eye problems    Current Outpatient Medications on File Prior to Visit  Medication Sig Dispense Refill  . Acetaminophen (TYLENOL EXTRA STRENGTH PO) Take 1 capsule 2 (two) times daily by mouth.    Marland Kitchen albuterol (PROVENTIL HFA;VENTOLIN HFA) 108 (90 Base) MCG/ACT inhaler INHALE TWO PUFFS BY MOUTH EVERY 4 HOURS AS NEEDED FOR WHEEZING  AND 2 PUFFS BEFORE EXERCISE OR EXPOSURE TO COLD AIR 18 each 2  . b complex vitamins tablet Take 1 tablet by mouth daily. Reported on 12/18/2015    . Calcium Carbonate-Vitamin D (CALCIUM-VITAMIN D3 PO) Take 1 capsule by mouth daily. Reported on 12/18/2015    . Cholecalciferol (VITAMIN D3 PO) Take 5,000 Units daily by mouth.    Marland Kitchen glucosamine-chondroitin 500-400 MG tablet Take 1 tablet by mouth daily. Reported on 12/18/2015    . Hyaluronic Acid-Vitamin C (HYALURONIC ACID PO) Take 1 tablet by mouth daily. Reported on 12/18/2015    . IBUPROFEN PO Take 1 capsule 2 (two) times daily by mouth.    Marland Kitchen LORazepam (ATIVAN) 1 MG tablet Take 1 tablet (1 mg total) by mouth as needed for anxiety. 20 tablet 0  . montelukast (SINGULAIR) 10 MG tablet Take 1 tablet (10 mg total) by mouth at bedtime. 90 tablet 3  . Multiple Vitamin (MULTIVITAMIN) capsule Take 1 capsule by mouth daily. Reported on 12/18/2015    . Omega-3 Fatty Acids (FISH OIL PO) Take 1 capsule by mouth daily. Reported on 12/18/2015    . OVER THE COUNTER MEDICATION BONE UP: 2 Capsules once daily    . oxybutynin (OXYTROL) 3.9 MG/24HR Place 1 patch onto the skin every 3 (three) days. 8 patch 12  . Probiotic Product (PROBIOTIC DAILY PO) Take 1 tablet by mouth daily.    . simvastatin (ZOCOR) 40 MG tablet Take 1 tablet (40 mg total) by mouth at bedtime. 90 tablet 3  . TURMERIC PO Take 1 capsule daily by mouth.    . Ubiquinol 100 MG CAPS Take 1 capsule by mouth 3 (three) times a week. Reported on 12/18/2015     No current facility-administered medications on file prior to visit.     Review of Systems  Constitutional: Negative for activity change, appetite change, fatigue, fever and unexpected weight change.  HENT: Negative for congestion, ear pain, rhinorrhea, sinus pressure and sore throat.   Eyes: Negative for pain, redness and visual disturbance.  Respiratory: Negative for cough, shortness of breath and wheezing.   Cardiovascular: Negative for chest pain and  palpitations.  Gastrointestinal: Negative for abdominal pain, blood in stool, constipation and diarrhea.  Endocrine: Negative for polydipsia and polyuria.  Genitourinary: Negative for dysuria, frequency and urgency.  Musculoskeletal: Negative for arthralgias, back pain and myalgias.  Skin: Negative for pallor and rash.  Allergic/Immunologic: Negative for environmental allergies.  Neurological: Positive for headaches. Negative for dizziness and syncope.       Headache when bp is up   Hematological: Negative for adenopathy. Does not  bruise/bleed easily.  Psychiatric/Behavioral: Negative for decreased concentration and dysphoric mood. The patient is not nervous/anxious.        Stressors        Objective:   Physical Exam  Constitutional: She appears well-developed and well-nourished. No distress.  obese and well appearing   HENT:  Head: Normocephalic and atraumatic.  Mouth/Throat: Oropharynx is clear and moist.  Eyes: Pupils are equal, round, and reactive to light. Conjunctivae and EOM are normal.  Neck: Normal range of motion. Neck supple. No JVD present. Carotid bruit is not present. No thyromegaly present.  Cardiovascular: Normal rate, regular rhythm, normal heart sounds and intact distal pulses. Exam reveals no gallop.  Pulmonary/Chest: Effort normal and breath sounds normal. No respiratory distress. She has no wheezes. She has no rales.  No crackles  Abdominal: Soft. Bowel sounds are normal. She exhibits no distension, no abdominal bruit and no mass. There is no tenderness.  Musculoskeletal: She exhibits no edema or deformity.  Lymphadenopathy:    She has no cervical adenopathy.  Neurological: She is alert. She has normal reflexes. She displays normal reflexes. No cranial nerve deficit. Coordination normal.  Skin: Skin is warm and dry. No rash noted.  Psychiatric: She has a normal mood and affect.  Discusses stressors candidly          Assessment & Plan:   Problem List  Items Addressed This Visit      Cardiovascular and Mediastinum   Essential hypertension    With pos fam hx and inc bp with age  Good health habits  Dealing well with stress  Will try amlodipine 5 mg daily (disc poss side eff incl low bp or edema) Will update  F/u in jan -and will watch bp at home as well        Relevant Medications   amLODipine (NORVASC) 5 MG tablet     Other   Hyperlipidemia    Continues statin and diet       Relevant Medications   amLODipine (NORVASC) 5 MG tablet   Obesity    Discussed how this problem influences overall health and the risks it imposes  Reviewed plan for weight loss with lower calorie diet (via better food choices and also portion control or program like weight watchers) and exercise building up to or more than 30 minutes 5 days per week including some aerobic activity    Enc pt to keep up good exercise

## 2018-06-07 NOTE — Assessment & Plan Note (Signed)
With pos fam hx and inc bp with age  Good health habits  Dealing well with stress  Will try amlodipine 5 mg daily (disc poss side eff incl low bp or edema) Will update  F/u in jan -and will watch bp at home as well

## 2018-06-07 NOTE — Assessment & Plan Note (Signed)
Continues statin and diet

## 2018-06-07 NOTE — Patient Instructions (Signed)
Drink water Avoid excess sodium  Keep exercising  Work to deal well with stress   Start amlodipine 5 mg once daily  If any side effects or problems- hold it and let us know   Let's see how bp is in January - if not improving at home let me know

## 2018-06-07 NOTE — Assessment & Plan Note (Addendum)
Discussed how this problem influences overall health and the risks it imposes  Reviewed plan for weight loss with lower calorie diet (via better food choices and also portion control or program like weight watchers) and exercise building up to or more than 30 minutes 5 days per week including some aerobic activity    Enc pt to keep up good exercise

## 2018-06-30 ENCOUNTER — Ambulatory Visit: Payer: Medicare PPO

## 2018-07-05 DIAGNOSIS — H43813 Vitreous degeneration, bilateral: Secondary | ICD-10-CM | POA: Diagnosis not present

## 2018-07-07 ENCOUNTER — Encounter: Payer: Medicare PPO | Admitting: Family Medicine

## 2018-07-30 ENCOUNTER — Telehealth: Payer: Self-pay | Admitting: Family Medicine

## 2018-07-30 DIAGNOSIS — E7800 Pure hypercholesterolemia, unspecified: Secondary | ICD-10-CM

## 2018-07-30 DIAGNOSIS — E1121 Type 2 diabetes mellitus with diabetic nephropathy: Secondary | ICD-10-CM

## 2018-07-30 DIAGNOSIS — I10 Essential (primary) hypertension: Secondary | ICD-10-CM

## 2018-07-30 NOTE — Telephone Encounter (Signed)
-----   Message from Eustace Pen, LPN sent at [id number]  1:44 PM EST ----- Regarding: Labs 1/6 Lab orders needed. Thank you.  Insurance:  [employer]

## 2018-07-31 ENCOUNTER — Ambulatory Visit (INDEPENDENT_AMBULATORY_CARE_PROVIDER_SITE_OTHER): Payer: Medicare PPO

## 2018-07-31 VITALS — BP 108/70 | HR 78 | Temp 98.0°F | Ht 64.0 in | Wt 199.5 lb

## 2018-07-31 DIAGNOSIS — E1121 Type 2 diabetes mellitus with diabetic nephropathy: Secondary | ICD-10-CM

## 2018-07-31 DIAGNOSIS — E7800 Pure hypercholesterolemia, unspecified: Secondary | ICD-10-CM

## 2018-07-31 DIAGNOSIS — I10 Essential (primary) hypertension: Secondary | ICD-10-CM

## 2018-07-31 DIAGNOSIS — Z0000 Encounter for general adult medical examination without abnormal findings: Secondary | ICD-10-CM | POA: Diagnosis not present

## 2018-07-31 LAB — CBC WITH DIFFERENTIAL/PLATELET
Basophils Absolute: 0.1 10*3/uL (ref 0.0–0.1)
Basophils Relative: 1.3 % (ref 0.0–3.0)
EOS PCT: 4.4 % (ref 0.0–5.0)
Eosinophils Absolute: 0.3 10*3/uL (ref 0.0–0.7)
HCT: 45.8 % (ref 36.0–46.0)
Hemoglobin: 15.5 g/dL — ABNORMAL HIGH (ref 12.0–15.0)
LYMPHS ABS: 2.1 10*3/uL (ref 0.7–4.0)
LYMPHS PCT: 33.4 % (ref 12.0–46.0)
MCHC: 33.8 g/dL (ref 30.0–36.0)
MCV: 95.6 fl (ref 78.0–100.0)
MONOS PCT: 10.2 % (ref 3.0–12.0)
Monocytes Absolute: 0.6 10*3/uL (ref 0.1–1.0)
NEUTROS PCT: 50.7 % (ref 43.0–77.0)
Neutro Abs: 3.1 10*3/uL (ref 1.4–7.7)
PLATELETS: 267 10*3/uL (ref 150.0–400.0)
RBC: 4.8 Mil/uL (ref 3.87–5.11)
RDW: 13 % (ref 11.5–15.5)
WBC: 6.1 10*3/uL (ref 4.0–10.5)

## 2018-07-31 LAB — LIPID PANEL
Cholesterol: 197 mg/dL (ref 0–200)
HDL: 47.5 mg/dL (ref >39.00)
NONHDL: 149.11
TRIGLYCERIDES: 238 mg/dL — AB (ref 0.0–149.0)
Total CHOL/HDL Ratio: 4
VLDL: 47.6 mg/dL — AB (ref 0.0–40.0)

## 2018-07-31 LAB — COMPREHENSIVE METABOLIC PANEL
ALK PHOS: 74 U/L (ref 39–117)
ALT: 12 U/L (ref 0–35)
AST: 17 U/L (ref 0–37)
Albumin: 3.9 g/dL (ref 3.5–5.2)
BILIRUBIN TOTAL: 0.6 mg/dL (ref 0.2–1.2)
BUN: 24 mg/dL — ABNORMAL HIGH (ref 6–23)
CALCIUM: 9.3 mg/dL (ref 8.4–10.5)
CO2: 29 mEq/L (ref 19–32)
Chloride: 106 mEq/L (ref 96–112)
Creatinine, Ser: 0.89 mg/dL (ref 0.40–1.20)
GFR: 65.11 mL/min (ref >60.00)
Glucose, Bld: 125 mg/dL — ABNORMAL HIGH (ref 70–99)
POTASSIUM: 4.5 meq/L (ref 3.5–5.1)
Sodium: 142 mEq/L (ref 135–145)
TOTAL PROTEIN: 6.6 g/dL (ref 6.0–8.3)

## 2018-07-31 LAB — LDL CHOLESTEROL, DIRECT: Direct LDL: 124 mg/dL

## 2018-07-31 LAB — TSH: TSH: 2.88 u[IU]/mL (ref 0.35–4.50)

## 2018-07-31 LAB — HEMOGLOBIN A1C: HEMOGLOBIN A1C: 5.9 % (ref 4.6–6.5)

## 2018-07-31 NOTE — Progress Notes (Signed)
PCP notes:   Health maintenance:  A1C - completed  Abnormal screenings:   Hearing - failed  Hearing Screening   125Hz  250Hz  500Hz  1000Hz  2000Hz  3000Hz  4000Hz  6000Hz  8000Hz   Right ear:   40 40 40  0    Left ear:   40 40 40  40     Fall risk - hx of single fall Fall Risk  07/31/2018 06/01/2017 05/31/2016 02/19/2016 02/12/2016  Falls in the past year? 1 No Yes No No  Comment fell while hiking; stumbled on root; denies injury - fall without injury; approx. 2 mths ago; pt lost balance - -  Number falls in past yr: 0 - 1 - -  Injury with Fall? 0 - No - -  Follow up - - Falls evaluation completed - -   Patient concerns:   Recent onset of abdominal pain approx. 2 days ago. States it is gassy in nature and may be related to previous prolapse surgery.   Nurse concerns:  None  Next PCP appt:   08/07/18 @ 0830  I reviewed health advisor's note, was available for consultation, and agree with documentation and plan. Loura Pardon MD

## 2018-07-31 NOTE — Patient Instructions (Signed)
Cheryl Joseph , Thank you for taking time to come for your Medicare Wellness Visit. I appreciate your ongoing commitment to your health goals. Please review the following plan we discussed and let me know if I can assist you in the future.   These are the goals we discussed: Goals    . Increase physical activity     Starting 07/31/2018, I will continue to exercise on stationary bike for 20 minutes daily and to walk or do strength training for 10-15 minutes 2 days per week.        This is a list of the screening recommended for you and due dates:  Health Maintenance  Topic Date Due  . Mammogram  08/03/2018  . Complete foot exam   08/05/2018  . Hemoglobin A1C  01/29/2019  . Tetanus Vaccine  05/15/2019  . Eye exam for diabetics  06/26/2019  . Flu Shot  Completed  . DEXA scan (bone density measurement)  Completed  . Pneumonia vaccines  Completed   Preventive Care for Adults  A healthy lifestyle and preventive care can promote health and wellness. Preventive health guidelines for adults include the following key practices.  . A routine yearly physical is a good way to check with your health care provider about your health and preventive screening. It is a chance to share any concerns and updates on your health and to receive a thorough exam.  . Visit your dentist for a routine exam and preventive care every 6 months. Brush your teeth twice a day and floss once a day. Good oral hygiene prevents tooth decay and gum disease.  . The frequency of eye exams is based on your age, health, family medical history, use  of contact lenses, and other factors. Follow your health care provider's recommendations for frequency of eye exams.  . Eat a healthy diet. Foods like vegetables, fruits, whole grains, low-fat dairy products, and lean protein foods contain the nutrients you need without too many calories. Decrease your intake of foods high in solid fats, added sugars, and salt. Eat the right amount of  calories for you. Get information about a proper diet from your health care provider, if necessary.  . Regular physical exercise is one of the most important things you can do for your health. Most adults should get at least 150 minutes of moderate-intensity exercise (any activity that increases your heart rate and causes you to sweat) each week. In addition, most adults need muscle-strengthening exercises on 2 or more days a week.  Silver Sneakers may be a benefit available to you. To determine eligibility, you may visit the website: www.silversneakers.com or contact program at (250) 734-6964 Mon-Fri between 8AM-8PM.   . Maintain a healthy weight. The body mass index (BMI) is a screening tool to identify possible weight problems. It provides an estimate of body fat based on height and weight. Your health care provider can find your BMI and can help you achieve or maintain a healthy weight.   For adults 20 years and older: ? A BMI below 18.5 is considered underweight. ? A BMI of 18.5 to 24.9 is normal. ? A BMI of 25 to 29.9 is considered overweight. ? A BMI of 30 and above is considered obese.   . Maintain normal blood lipids and cholesterol levels by exercising and minimizing your intake of saturated fat. Eat a balanced diet with plenty of fruit and vegetables. Blood tests for lipids and cholesterol should begin at age 24 and be repeated every 5  years. If your lipid or cholesterol levels are high, you are over 50, or you are at high risk for heart disease, you may need your cholesterol levels checked more frequently. Ongoing high lipid and cholesterol levels should be treated with medicines if diet and exercise are not working.  . If you smoke, find out from your health care provider how to quit. If you do not use tobacco, please do not start.  . If you choose to drink alcohol, please do not consume more than 2 drinks per day. One drink is considered to be 12 ounces (355 mL) of beer, 5 ounces  (148 mL) of wine, or 1.5 ounces (44 mL) of liquor.  . If you are 22-17 years old, ask your health care provider if you should take aspirin to prevent strokes.  . Use sunscreen. Apply sunscreen liberally and repeatedly throughout the day. You should seek shade when your shadow is shorter than you. Protect yourself by wearing long sleeves, pants, a wide-brimmed hat, and sunglasses year round, whenever you are outdoors.  . Once a month, do a whole body skin exam, using a mirror to look at the skin on your back. Tell your health care provider of new moles, moles that have irregular borders, moles that are larger than a pencil eraser, or moles that have changed in shape or color.

## 2018-07-31 NOTE — Progress Notes (Signed)
Subjective:   Cheryl Joseph is a 77 y.o. female who presents for Medicare Annual (Subsequent) preventive examination.  Review of Systems:  N/A Cardiac Risk Factors include: advanced age (>2men, >90 women);diabetes mellitus;dyslipidemia;hypertension;obesity (BMI >30kg/m2)     Objective:     Vitals: BP 108/70 (BP Location: Right Arm, Patient Position: Sitting, Cuff Size: Normal)   Pulse 78   Temp 98 F (36.7 C) (Oral)   Ht 5\' 4"  (1.626 m) Comment: shoes  Wt 199 lb 8 oz (90.5 kg)   SpO2 96%   BMI 34.24 kg/m   Body mass index is 34.24 kg/m.  Advanced Directives 07/31/2018 08/19/2017 06/04/2017 06/01/2017 05/31/2016 01/05/2016 11/23/2015  Does Patient Have a Medical Advance Directive? No No No No No No No  Would patient like information on creating a medical advance directive? Yes (MAU/Ambulatory/Procedural Areas - Information given) No - Patient declined No - Patient declined Yes (MAU/Ambulatory/Procedural Areas - Information given) No - patient declined information No - patient declined information -    Tobacco Social History   Tobacco Use  Smoking Status Never Smoker  Smokeless Tobacco Never Used     Counseling given: No   Clinical Intake:  Pre-visit preparation completed: Yes  Pain : 0-10 Pain Score: 4  Pain Type: Acute pain Pain Location: Abdomen Pain Orientation: Lower Pain Descriptors / Indicators: Other (Comment)("gassy") Pain Onset: In the past 7 days Pain Frequency: Intermittent     Nutritional Status: BMI > 30  Obese Diabetes: Yes CBG done?: No Did pt. bring in CBG monitor from home?: No  How often do you need to have someone help you when you read instructions, pamphlets, or other written materials from your doctor or pharmacy?: 1 - Never What is the last grade level you completed in school?: 12th grade  Interpreter Needed?: No  Comments: pt lives with spouse Information entered by :: LPinson, LPN  Past Medical History:  Diagnosis Date  .  Anginal pain ([place])   . Arthritis    RA hands(seronegative)  . Asthma   . Back pain   . Diabetes mellitus without complication ([place])   . HPV in female 1996   HPV with colposcopy (all neg paps since)  . Hyperlipidemia   . Obesity   . Osteopenia   . Shortness of breath dyspnea   . Shoulder pain    Past Surgical History:  Procedure Laterality Date  . COLONOSCOPY WITH PROPOFOL N/A 05/01/2015   Procedure: COLONOSCOPY WITH PROPOFOL;  Surgeon: Manya Silvas, MD;  Location: [facility] ENDOSCOPY;  Service: Endoscopy;  Laterality: N/A;  . EYE SURGERY    . pelvic organ prolapse surgery   2015  . SQUAMOUS CELL CARCINOMA EXCISION Left 12/2017   left arm  . TONSILLECTOMY    . VAGINAL HYSTERECTOMY  2015   with pelvic organ prolapse surgery    Family History  Problem Relation Age of Onset  . Diabetes Mother   . Hyperlipidemia Mother   . Heart disease Mother   . Hypertension Mother   . Obesity Mother   . Kyphosis Mother   . Heart disease Father   . Hypertension Father   . Cancer Father        lung Cancer smoker  . Diabetes Daughter   . Kyphosis Sister    Social History   Socioeconomic History  . Marital status: Married    Spouse name: Not on file  . Number of children: Not on file  . Years of education: Not on file  .  Highest education level: Not on file  Occupational History  . Not on file  Social Needs  . Financial resource strain: Not on file  . Food insecurity:    Worry: Not on file    Inability: Not on file  . Transportation needs:    Medical: Not on file    Non-medical: Not on file  Tobacco Use  . Smoking status: Never Smoker  . Smokeless tobacco: Never Used  Substance and Sexual Activity  . Alcohol use: No    Alcohol/week: 0.0 standard drinks  . Drug use: No  . Sexual activity: Yes  Lifestyle  . Physical activity:    Days per week: Not on file    Minutes per session: Not on file  . Stress: Not on file  Relationships  . Social connections:    Talks on phone:  Not on file    Gets together: Not on file    Attends religious service: Not on file    Active member of club or organization: Not on file    Attends meetings of clubs or organizations: Not on file    Relationship status: Not on file  Other Topics Concern  . Not on file  Social History Narrative  . Not on file    Outpatient Encounter Medications as of 07/31/2018  Medication Sig  . Acetaminophen (TYLENOL EXTRA STRENGTH PO) Take 1 capsule 2 (two) times daily by mouth.  Marland Kitchen albuterol (PROVENTIL HFA;VENTOLIN HFA) 108 (90 Base) MCG/ACT inhaler INHALE TWO PUFFS BY MOUTH EVERY 4 HOURS AS NEEDED FOR WHEEZING AND 2 PUFFS BEFORE EXERCISE OR EXPOSURE TO COLD AIR  . amLODipine (NORVASC) 5 MG tablet Take 1 tablet (5 mg total) by mouth daily.  Marland Kitchen b complex vitamins tablet Take 1 tablet by mouth daily. Reported on 12/18/2015  . Calcium Carbonate-Vitamin D (CALCIUM-VITAMIN D3 PO) Take 1 capsule by mouth daily. Reported on 12/18/2015  . Cholecalciferol (VITAMIN D3 PO) Take 5,000 Units daily by mouth.  Marland Kitchen glucosamine-chondroitin 500-400 MG tablet Take 1 tablet by mouth daily. Reported on 12/18/2015  . Hyaluronic Acid-Vitamin C (HYALURONIC ACID PO) Take 1 tablet by mouth daily. Reported on 12/18/2015  . IBUPROFEN PO Take 1 capsule 2 (two) times daily by mouth.  Marland Kitchen LORazepam (ATIVAN) 1 MG tablet Take 1 tablet (1 mg total) by mouth as needed for anxiety.  . montelukast (SINGULAIR) 10 MG tablet Take 1 tablet (10 mg total) by mouth at bedtime.  . Multiple Vitamin (MULTIVITAMIN) capsule Take 1 capsule by mouth daily. Reported on 12/18/2015  . Omega-3 Fatty Acids (FISH OIL PO) Take 1 capsule by mouth daily. Reported on 12/18/2015  . OVER THE COUNTER MEDICATION BONE UP: 2 Capsules once daily  . oxybutynin (OXYTROL) 3.9 MG/24HR Place 1 patch onto the skin every 3 (three) days.  . Probiotic Product (PROBIOTIC DAILY PO) Take 1 tablet by mouth daily.  . simvastatin (ZOCOR) 40 MG tablet Take 1 tablet (40 mg total) by mouth at  bedtime.  . TURMERIC PO Take 1 capsule daily by mouth.  . Ubiquinol 100 MG CAPS Take 1 capsule by mouth 3 (three) times a week. Reported on 12/18/2015   No facility-administered encounter medications on file as of 07/31/2018.     Activities of Daily Living In your present state of health, do you have any difficulty performing the following activities: 07/31/2018  Hearing? Y  Vision? N  Difficulty concentrating or making decisions? N  Walking or climbing stairs? N  Dressing or bathing? N  Doing errands,  shopping? N  Preparing Food and eating ? N  Using the Toilet? N  In the past six months, have you accidently leaked urine? Y  Do you have problems with loss of bowel control? N  Managing your Medications? N  Managing your Finances? N  Housekeeping or managing your Housekeeping? N  Some recent data might be hidden    Patient Care Team: Tower, Wynelle Fanny, MD as PCP - General Birder Robson, MD as Referring Physician (Ophthalmology) Garrel Ridgel, DPM as Consulting Physician (Podiatry) Yolonda Kida, MD as Consulting Physician (Cardiology)    Assessment:   This is a routine wellness examination for Mirelle.   Hearing Screening   125Hz  250Hz  500Hz  1000Hz  2000Hz  3000Hz  4000Hz  6000Hz  8000Hz   Right ear:   40 40 40  0    Left ear:   40 40 40  40    Vision Screening Comments: Vision exam in December 2019 with Dr. George Ina   Exercise Activities and Dietary recommendations Current Exercise Habits: Home exercise routine, Type of exercise: walking;strength training/weights;Other - see comments(stationary bike), Time (Minutes): 20, Frequency (Times/Week): 7, Weekly Exercise (Minutes/Week): 140, Intensity: Moderate, Exercise limited by: None identified  Goals    . Increase physical activity     Starting 07/31/2018, I will continue to exercise on stationary bike for 20 minutes daily and to walk or do strength training for 10-15 minutes 2 days per week.        Fall Risk Fall Risk   07/31/2018 06/01/2017 05/31/2016 02/19/2016 02/12/2016  Falls in the past year? 1 No Yes No No  Comment fell while hiking; stumbled on root; denies injury - fall without injury; approx. 2 mths ago; pt lost balance - -  Number falls in past yr: 0 - 1 - -  Injury with Fall? 0 - No - -  Follow up - - Falls evaluation completed - -   Depression Screen PHQ 2/9 Scores 07/31/2018 06/01/2017 05/31/2016 01/05/2016  PHQ - 2 Score 0 0 0 0  PHQ- 9 Score 0 0 - -     Cognitive Function MMSE - Mini Mental State Exam 07/31/2018 06/01/2017 05/31/2016  Orientation to time 5 5 5   Orientation to Place 5 5 5   Registration 3 3 3   Attention/ Calculation 0 0 0  Recall 3 3 3   Language- name 2 objects 0 0 0  Language- repeat 1 1 1   Language- follow 3 step command 3 3 3   Language- read & follow direction 0 0 0  Write a sentence 0 0 0  Copy design 0 0 0  Total score 20 20 20        PLEASE NOTE: A Mini-Cog screen was completed. Maximum score is 20. A value of 0 denotes this part of [place] MMSE was not completed or the patient failed this part of the Mini-Cog screening.   Mini-Cog Screening Orientation to Time - Max 5 pts Orientation to Place - Max 5 pts Registration - Max 3 pts Recall - Max 3 pts Language Repeat - Max 1 pts Language Follow 3 Step Command - Max 3 pts   Immunization History  Administered Date(s) Administered  . Influenza Split 05/27/2011, 05/01/2012  . Influenza Whole 04/27/2007, 05/23/2008, 05/14/2009, 05/05/2010  . Influenza,inj,Quad PF,6+ Mos 05/21/2013, 04/26/2014, 05/09/2015, 05/19/2016, 04/26/2017, 05/18/2018  . Pneumococcal Conjugate-13 08/19/2014  . Pneumococcal Polysaccharide-23 05/31/2005  . Pneumococcal-Unspecified 04/28/2015  . Td 11/24/1998, 05/14/2009  . Zoster 03/05/2008    Screening Tests Health Maintenance  Topic Date Due  .  MAMMOGRAM  08/03/2018  . FOOT EXAM  08/05/2018  . HEMOGLOBIN A1C  01/29/2019  . TETANUS/TDAP  05/15/2019  . OPHTHALMOLOGY EXAM  06/26/2019  .  INFLUENZA VACCINE  Completed  . DEXA SCAN  Completed  . PNA vac Low Risk Adult  Completed      Plan:     I have personally reviewed, addressed, and noted the following in the patient's chart:  A. Medical and social history B. Use of alcohol, tobacco or illicit drugs  C. Current medications and supplements D. Functional ability and status E.  Nutritional status F.  Physical activity G. Advance directives H. List of other physicians I.  Hospitalizations, surgeries, and ER visits in previous 12 months J.  [place] to include hearing, vision, cognitive, depression L. Referrals and appointments - none  In addition, I have reviewed and discussed with patient certain preventive protocols, quality metrics, and best practice recommendations. A written personalized care plan for preventive services as well as general preventive health recommendations were provided to patient.  See attached scanned questionnaire for additional information.   Signed,   Lindell Noe, MHA, BS, LPN Health Coach

## 2018-08-07 ENCOUNTER — Encounter: Payer: Medicare PPO | Admitting: Family Medicine

## 2018-08-09 ENCOUNTER — Encounter: Payer: Medicare PPO | Admitting: Family Medicine

## 2018-08-16 ENCOUNTER — Encounter: Payer: Self-pay | Admitting: Family Medicine

## 2018-08-16 ENCOUNTER — Ambulatory Visit: Payer: Medicare PPO | Admitting: Family Medicine

## 2018-08-16 VITALS — BP 126/72 | HR 65 | Temp 98.6°F | Ht 64.0 in | Wt 203.0 lb

## 2018-08-16 DIAGNOSIS — R7303 Prediabetes: Secondary | ICD-10-CM

## 2018-08-16 DIAGNOSIS — M059 Rheumatoid arthritis with rheumatoid factor, unspecified: Secondary | ICD-10-CM | POA: Diagnosis not present

## 2018-08-16 DIAGNOSIS — Z0000 Encounter for general adult medical examination without abnormal findings: Secondary | ICD-10-CM

## 2018-08-16 DIAGNOSIS — E7800 Pure hypercholesterolemia, unspecified: Secondary | ICD-10-CM | POA: Diagnosis not present

## 2018-08-16 DIAGNOSIS — E2839 Other primary ovarian failure: Secondary | ICD-10-CM

## 2018-08-16 DIAGNOSIS — M8580 Other specified disorders of bone density and structure, unspecified site: Secondary | ICD-10-CM | POA: Diagnosis not present

## 2018-08-16 DIAGNOSIS — R1030 Lower abdominal pain, unspecified: Secondary | ICD-10-CM | POA: Insufficient documentation

## 2018-08-16 DIAGNOSIS — I10 Essential (primary) hypertension: Secondary | ICD-10-CM

## 2018-08-16 DIAGNOSIS — Z6835 Body mass index (BMI) 35.0-35.9, adult: Secondary | ICD-10-CM

## 2018-08-16 DIAGNOSIS — K5730 Diverticulosis of large intestine without perforation or abscess without bleeding: Secondary | ICD-10-CM

## 2018-08-16 MED ORDER — SIMVASTATIN 40 MG PO TABS: 40.0000 mg | ORAL_TABLET | Freq: Every day | ORAL | 3 refills | Status: DC

## 2018-08-16 MED ORDER — MONTELUKAST SODIUM 10 MG PO TABS: 10.0000 mg | ORAL_TABLET | Freq: Every day | ORAL | 3 refills | Status: DC

## 2018-08-16 MED ORDER — OXYBUTYNIN 3.9 MG/24HR TD PTTW: 1.0000 | MEDICATED_PATCH | TRANSDERMAL | 12 refills | Status: DC

## 2018-08-16 MED ORDER — AMLODIPINE BESYLATE 5 MG PO TABS: 5.0000 mg | ORAL_TABLET | Freq: Every day | ORAL | 11 refills | Status: DC

## 2018-08-16 NOTE — Assessment & Plan Note (Signed)
I do wonder if this could be causing intermittent low abd pain /gas issues Disc fiber intake and avoidance of nuts and seeds  Will continue to follow

## 2018-08-16 NOTE — Assessment & Plan Note (Signed)
Intermittent bilateral gas like pain -comes and goes Hx of diverticulosis and IBS in the past Enc fiber/more fluids in diet  Also avoidance of nuts and seeds Pelvic exam -noted rectocele (no tenderness)- doubt related  If worse or no imp consider imaging or GI ref

## 2018-08-16 NOTE — Assessment & Plan Note (Signed)
Discussed how this problem influences overall health and the risks it imposes  Reviewed plan for weight loss with lower calorie diet (via better food choices and also portion control or program like weight watchers) and exercise building up to or more than 30 minutes 5 days per week including some aerobic activity    

## 2018-08-16 NOTE — Assessment & Plan Note (Signed)
Disc goals for lipids and reasons to control them Rev last labs with pt Rev low sat fat diet in detail  LDL and trig up a bit Disc watching red meat and fatty pork in diet  Continue simvastatin

## 2018-08-16 NOTE — Assessment & Plan Note (Signed)
Lab Results  Component Value Date   HGBA1C 5.9 07/31/2018   Previously diabetic-much better with diet and exercise now  No medications disc imp of low glycemic diet and wt loss to prevent DM2

## 2018-08-16 NOTE — Assessment & Plan Note (Signed)
Due for 2y dexa- ordered Pt decided not to try the alendronate Continues ca and D One fall and no fx this year  Good exercise regimen as well

## 2018-08-16 NOTE — Patient Instructions (Addendum)
For weight loss and blood sugar  Try to get most of your carbohydrates from produce (with the exception of white potatoes)  Eat less bread/pasta/rice/snack foods/cereals/sweets and other items from the middle of the grocery store (processed carbs)   Please keep me posted about abdominal symptoms  You may want to avoid nuts and seeds - this could cause diverticulosis pain   We will refer you for bone density test  You can schedule your mammogram   Take care of yourself  Keep exercising

## 2018-08-16 NOTE — Assessment & Plan Note (Signed)
Reviewed health habits including diet and exercise and skin cancer prevention Reviewed appropriate screening tests for age  Also reviewed health mt list, fam hx and immunization status , as well as social and family history   See HPI amw rev Disc fall prevention  dexa ordered Pt will schedule her own mammogram  Enc better diet and wt loss

## 2018-08-16 NOTE — Progress Notes (Signed)
Subjective:    Patient ID: Cheryl Joseph, female    DOB: 02/14/1941, 78 y.o.   MRN: [id number]  HPI Here for health maintenance exam and to review chronic medical problems    Got through the holidays ok this year  Taking care of herself   Intermittent pain in her abdomen at times  Low abd both sides  Feels like gas  Then it gets better  occ ibuprofen  Has h/o diverticulosis    Has had a hysterectomy  Also pelvic prolapse surgery  Sometimes she has pain after intercourse - ever since that surgery  Surgeon checked her out- as ok   Wt Readings from Last 3 Encounters:  08/16/18 203 lb (92.1 kg)  07/31/18 199 lb 8 oz (90.5 kg)  06/07/18 201 lb (91.2 kg)  trying to loose weight  Was good after xmas  She is practicing portion control  Exercise - she uses 20-40 minutes per day of bike and also some weights  34.84 kg/m   Had amw on 1/6 Missed highest tone in R ear hearing screen- she has not noticed much hearing loss  1 fall this year- stumbled while hiking   Mammogram 1/19 - due for that / she will set it up at [facility] breast exam -no lumps   dexa 11/17  Osteopenia  Hx of stress fx in foot  On ca and D She did not try alendronate (had discussed it)   zostavax 8/09  bp is stable today  No cp or palpitations or headaches or edema  No side effects to medicines  BP Readings from Last 3 Encounters:  08/16/18 126/72  07/31/18 108/70  06/07/18 (!) 146/78     Prediabetes  Lab Results  Component Value Date   HGBA1C 5.9 07/31/2018  no medications  Has been resistant to ace   Hyperlipidemia Lab Results  Component Value Date   CHOL 197 07/31/2018   CHOL 183 05/27/2017   CHOL 201 (H) 05/19/2016   Lab Results  Component Value Date   HDL 47.50 07/31/2018   HDL 48.70 05/27/2017   HDL 46.40 05/19/2016   Lab Results  Component Value Date   LDLCALC 102 (H) 08/14/2014   LDLCALC 107 (H) 07/11/2013   LDLCALC 112 (H) 06/16/2012   Lab Results    Component Value Date   TRIG 238.0 (H) 07/31/2018   TRIG 202.0 (H) 05/27/2017   TRIG 226.0 (H) 05/19/2016   Lab Results  Component Value Date   CHOLHDL 4 07/31/2018   CHOLHDL 4 05/27/2017   CHOLHDL 4 05/19/2016   Lab Results  Component Value Date   LDLDIRECT 124.0 07/31/2018   LDLDIRECT 114.0 05/27/2017   LDLDIRECT 132.0 05/19/2016  taking simvastatin 40 mg  LDL is up a bit  Also triglycerides Ate BBQ a bit more than in the past occ red meat   Lab Results  Component Value Date   WBC 6.1 07/31/2018   HGB 15.5 (H) 07/31/2018   HCT 45.8 07/31/2018   MCV 95.6 07/31/2018   PLT 267.0 07/31/2018    Lab Results  Component Value Date   CREATININE 0.89 07/31/2018   BUN 24 (H) 07/31/2018   NA 142 07/31/2018   K 4.5 07/31/2018   CL 106 07/31/2018   CO2 29 07/31/2018   Lab Results  Component Value Date   ALT 12 07/31/2018   AST 17 07/31/2018   ALKPHOS 74 07/31/2018   BILITOT 0.6 07/31/2018    Lab Results  Component  Value Date   TSH 2.88 07/31/2018    Patient Active Problem List   Diagnosis Date Noted  . Lower abdominal pain 08/16/2018  . Stress reaction 09/05/2017  . Estrogen deficiency 05/25/2016  . Routine general medical examination at a health care facility 11/23/2015  . Hammertoe 10/28/2015  . Frequent loose stools 02/14/2015  . Diverticulosis of colon without hemorrhage 02/14/2015  . Colon cancer screening 08/19/2014  . Essential hypertension 02/25/2014  . Encounter for Medicare annual wellness exam 07/16/2013  . Prediabetes 05/14/2009  . Osteopenia 03/07/2007  . Hyperlipidemia 01/03/2007  . Class 2 severe obesity due to excess calories with serious comorbidity and body mass index (BMI) of 35.0 to 35.9 in adult ([place]) 01/03/2007  . ALLERGIC RHINITIS, SEASONAL 01/03/2007  . ASTHMA 01/03/2007  . OVERACTIVE BLADDER 01/03/2007   Past Medical History:  Diagnosis Date  . Anginal pain ([place])   . Arthritis    RA hands(seronegative)  . Asthma   . Back pain    . Diabetes mellitus without complication ([place])   . HPV in female 1996   HPV with colposcopy (all neg paps since)  . Hyperlipidemia   . Obesity   . Osteopenia   . Shortness of breath dyspnea   . Shoulder pain    Past Surgical History:  Procedure Laterality Date  . COLONOSCOPY WITH PROPOFOL N/A 05/01/2015   Procedure: COLONOSCOPY WITH PROPOFOL;  Surgeon: Manya Silvas, MD;  Location: [facility];  Service: Endoscopy;  Laterality: N/A;  . EYE SURGERY    . pelvic organ prolapse surgery   2015  . SQUAMOUS CELL CARCINOMA EXCISION Left 12/2017   left arm  . TONSILLECTOMY    . VAGINAL HYSTERECTOMY  2015   with pelvic organ prolapse surgery    Social History   Tobacco Use  . Smoking status: Never Smoker  . Smokeless tobacco: Never Used  Substance Use Topics  . Alcohol use: No    Alcohol/week: 0.0 standard drinks  . Drug use: No   Family History  Problem Relation Age of Onset  . Diabetes Mother   . Hyperlipidemia Mother   . Heart disease Mother   . Hypertension Mother   . Obesity Mother   . Kyphosis Mother   . Heart disease Father   . Hypertension Father   . Cancer Father        lung Cancer smoker  . Diabetes Daughter   . Kyphosis Sister    Allergies  Allergen Reactions  . Ezetimibe-Simvastatin     REACTION: leg pain  . Tolterodine Tartrate     REACTION: side effects  . Vesicare [Solifenacin Succinate]     Eye problems    Current Outpatient Medications on File Prior to Visit  Medication Sig Dispense Refill  . Acetaminophen (TYLENOL EXTRA STRENGTH PO) Take 1 capsule 2 (two) times daily by mouth.    Marland Kitchen albuterol (PROVENTIL HFA;VENTOLIN HFA) 108 (90 Base) MCG/ACT inhaler INHALE TWO PUFFS BY MOUTH EVERY 4 HOURS AS NEEDED FOR WHEEZING AND 2 PUFFS BEFORE EXERCISE OR EXPOSURE TO COLD AIR 18 each 2  . b complex vitamins tablet Take 1 tablet by mouth daily. Reported on 12/18/2015    . Calcium Carbonate-Vitamin D (CALCIUM-VITAMIN D3 PO) Take 1 capsule by mouth daily.  Reported on 12/18/2015    . Cholecalciferol (VITAMIN D3 PO) Take 5,000 Units daily by mouth.    Marland Kitchen glucosamine-chondroitin 500-400 MG tablet Take 1 tablet by mouth daily. Reported on 12/18/2015    . Hyaluronic Acid-Vitamin C (HYALURONIC  ACID PO) Take 1 tablet by mouth daily. Reported on 12/18/2015    . IBUPROFEN PO Take 1 capsule 2 (two) times daily by mouth.    Marland Kitchen LORazepam (ATIVAN) 1 MG tablet Take 1 tablet (1 mg total) by mouth as needed for anxiety. 20 tablet 0  . Multiple Vitamin (MULTIVITAMIN) capsule Take 1 capsule by mouth daily. Reported on 12/18/2015    . Omega-3 Fatty Acids (FISH OIL PO) Take 1 capsule by mouth daily. Reported on 12/18/2015    . OVER THE COUNTER MEDICATION BONE UP: 2 Capsules once daily    . Probiotic Product (PROBIOTIC DAILY PO) Take 1 tablet by mouth daily.    . TURMERIC PO Take 1 capsule daily by mouth.    . Ubiquinol 100 MG CAPS Take 1 capsule by mouth 3 (three) times a week. Reported on 12/18/2015     No current facility-administered medications on file prior to visit.       Review of Systems  Constitutional: Negative for activity change, appetite change, fatigue, fever and unexpected weight change.  HENT: Negative for congestion, ear pain, rhinorrhea, sinus pressure and sore throat.   Eyes: Negative for pain, redness and visual disturbance.  Respiratory: Negative for cough, shortness of breath and wheezing.   Cardiovascular: Negative for chest pain and palpitations.  Gastrointestinal: Positive for abdominal pain. Negative for anal bleeding, blood in stool, constipation, diarrhea, nausea, rectal pain and vomiting.  Endocrine: Negative for polydipsia and polyuria.  Genitourinary: Positive for frequency. Negative for decreased urine volume, dysuria, urgency and vaginal bleeding.       Incontinence   Musculoskeletal: Negative for arthralgias, back pain and myalgias.  Skin: Negative for pallor and rash.  Allergic/Immunologic: Negative for environmental allergies.    Neurological: Negative for dizziness, syncope and headaches.  Hematological: Negative for adenopathy. Does not bruise/bleed easily.  Psychiatric/Behavioral: Negative for decreased concentration and dysphoric mood. The patient is not nervous/anxious.        Objective:   Physical Exam Constitutional:      General: She is not in acute distress.    Appearance: Normal appearance. She is well-developed. She is obese.  HENT:     Head: Normocephalic and atraumatic.     Right Ear: Tympanic membrane, ear canal and external ear normal.     Left Ear: Tympanic membrane, ear canal and external ear normal.     Ears:     Comments: Scant cerumen bilat    Nose: Nose normal.     Mouth/Throat:     Mouth: Mucous membranes are moist.     Pharynx: Oropharynx is clear.  Eyes:     General: No scleral icterus.    Conjunctiva/sclera: Conjunctivae normal.     Pupils: Pupils are equal, round, and reactive to light.  Neck:     Musculoskeletal: Normal range of motion and neck supple.     Thyroid: No thyromegaly.     Vascular: No carotid bruit or JVD.  Cardiovascular:     Rate and Rhythm: Normal rate and regular rhythm.     Pulses: Normal pulses.     Heart sounds: Normal heart sounds. No gallop.   Pulmonary:     Effort: Pulmonary effort is normal. No respiratory distress.     Breath sounds: Normal breath sounds. No wheezing.  Chest:     Chest wall: No tenderness.  Abdominal:     General: Bowel sounds are normal. There is no distension or abdominal bruit.     Palpations: Abdomen is soft. There  is no mass.     Tenderness: There is no abdominal tenderness. There is no rebound.     Comments: Slight bulging around umbilicus resembling hernia  No tenderness Reducible   Genitourinary:    Comments: Breast exam: No mass, nodules, thickening, tenderness, bulging, retraction, inflamation, nipple discharge or skin changes noted.  No axillary or clavicular LA.     Inverted nipples bilat (baseline)            Anus appears normal w/o hemorrhoids or masses     External genitalia : nl appearance and hair distribution/no lesions     Urethral meatus : nl size, no lesions or prolapse     Urethra: no masses, tenderness or scarring    Bladder : no masses or tenderness     Vagina: nl general appearance, no discharge or  Lesions, moderate sized rectocele noted    Cervix: surgically absent     Uterus: surg absent     Adnexa : unable to palpate       Musculoskeletal: Normal range of motion.        General: No tenderness.     Right lower leg: No edema.     Left lower leg: No edema.  Lymphadenopathy:     Cervical: No cervical adenopathy.  Skin:    General: Skin is warm and dry.     Capillary Refill: Capillary refill takes less than 2 seconds.     Coloration: Skin is not pale.     Findings: No erythema or rash.     Comments: Lentigines  Some sks   Neurological:     General: No focal deficit present.     Mental Status: She is alert.     Cranial Nerves: No cranial nerve deficit.     Motor: No abnormal muscle tone.     Coordination: Coordination normal.     Deep Tendon Reflexes: Reflexes are normal and symmetric. Reflexes normal.  Psychiatric:        Mood and Affect: Mood normal.           Assessment & Plan:   Problem List Items Addressed This Visit      Cardiovascular and Mediastinum   Essential hypertension    bp in fair control at this time  BP Readings from Last 1 Encounters:  08/16/18 126/72   No changes needed Most recent labs reviewed  Disc lifstyle change with low sodium diet and exercise        Relevant Medications   amLODipine (NORVASC) 5 MG tablet   simvastatin (ZOCOR) 40 MG tablet     Digestive   Diverticulosis of colon without hemorrhage    I do wonder if this could be causing intermittent low abd pain /gas issues Disc fiber intake and avoidance of nuts and seeds  Will continue to follow         Musculoskeletal and  Integument   Osteopenia    Due for 2y dexa- ordered Pt decided not to try the alendronate Continues ca and D One fall and no fx this year  Good exercise regimen as well       RESOLVED: Rheumatoid arthritis ([place])     Other   Hyperlipidemia    Disc goals for lipids and reasons to control them Rev last labs with pt Rev low sat fat diet in detail  LDL and trig up a bit Disc watching red meat and fatty pork in diet  Continue simvastatin       Relevant Medications  amLODipine (NORVASC) 5 MG tablet   simvastatin (ZOCOR) 40 MG tablet   Class 2 severe obesity due to excess calories with serious comorbidity and body mass index (BMI) of 35.0 to 35.9 in adult [facility])    Discussed how this problem influences overall health and the risks it imposes  Reviewed plan for weight loss with lower calorie diet (via better food choices and also portion control or program like weight watchers) and exercise building up to or more than 30 minutes 5 days per week including some aerobic activity         Prediabetes    Lab Results  Component Value Date   HGBA1C 5.9 07/31/2018   Previously diabetic-much better with diet and exercise now  No medications disc imp of low glycemic diet and wt loss to prevent DM2       Routine general medical examination at a health care facility - Primary    Reviewed health habits including diet and exercise and skin cancer prevention Reviewed appropriate screening tests for age  Also reviewed health mt list, fam hx and immunization status , as well as social and family history   See HPI amw rev Disc fall prevention  dexa ordered Pt will schedule her own mammogram  Enc better diet and wt loss       Estrogen deficiency   Relevant Orders   DG Bone Density   Lower abdominal pain    Intermittent bilateral gas like pain -comes and goes Hx of diverticulosis and IBS in the past Enc fiber/more fluids in diet  Also avoidance of nuts and seeds Pelvic exam -noted  rectocele (no tenderness)- doubt related  If worse or no imp consider imaging or GI ref

## 2018-08-16 NOTE — Assessment & Plan Note (Signed)
bp in fair control at this time  BP Readings from Last 1 Encounters:  08/16/18 126/72   No changes needed Most recent labs reviewed  Disc lifstyle change with low sodium diet and exercise

## 2018-08-18 ENCOUNTER — Telehealth: Payer: Self-pay | Admitting: Family Medicine

## 2018-08-18 NOTE — Telephone Encounter (Signed)
Left message asking pt to call office regarding bone density.  Please transfer to [place].  If im not here anyone up front can help with this

## 2018-08-21 ENCOUNTER — Other Ambulatory Visit: Payer: Self-pay | Admitting: Family Medicine

## 2018-08-21 DIAGNOSIS — Z1231 Encounter for screening mammogram for malignant neoplasm of breast: Secondary | ICD-10-CM

## 2018-09-04 ENCOUNTER — Ambulatory Visit
Admission: RE | Admit: 2018-09-04 | Discharge: 2018-09-04 | Disposition: A | Discharge status: Home / Self Care | Payer: Medicare PPO | Source: Ambulatory Visit | Attending: Family Medicine | Admitting: Family Medicine

## 2018-09-04 DIAGNOSIS — E2839 Other primary ovarian failure: Secondary | ICD-10-CM

## 2018-09-04 DIAGNOSIS — Z1231 Encounter for screening mammogram for malignant neoplasm of breast: Secondary | ICD-10-CM | POA: Insufficient documentation

## 2018-09-04 DIAGNOSIS — M8589 Other specified disorders of bone density and structure, multiple sites: Secondary | ICD-10-CM | POA: Diagnosis not present

## 2018-09-04 DIAGNOSIS — Z780 Asymptomatic menopausal state: Secondary | ICD-10-CM | POA: Diagnosis not present

## 2018-09-04 HISTORY — DX: Malignant (primary) neoplasm, unspecified: C80.1

## 2018-09-08 ENCOUNTER — Encounter: Payer: Self-pay | Admitting: *Deleted

## 2019-01-01 DIAGNOSIS — Z08 Encounter for follow-up examination after completed treatment for malignant neoplasm: Secondary | ICD-10-CM | POA: Diagnosis not present

## 2019-01-01 DIAGNOSIS — D0439 Carcinoma in situ of skin of other parts of face: Secondary | ICD-10-CM | POA: Diagnosis not present

## 2019-01-01 DIAGNOSIS — Z872 Personal history of diseases of the skin and subcutaneous tissue: Secondary | ICD-10-CM | POA: Diagnosis not present

## 2019-01-01 DIAGNOSIS — X32XXXA Exposure to sunlight, initial encounter: Secondary | ICD-10-CM | POA: Diagnosis not present

## 2019-01-01 DIAGNOSIS — L821 Other seborrheic keratosis: Secondary | ICD-10-CM | POA: Diagnosis not present

## 2019-01-01 DIAGNOSIS — Z85828 Personal history of other malignant neoplasm of skin: Secondary | ICD-10-CM | POA: Diagnosis not present

## 2019-01-01 DIAGNOSIS — L570 Actinic keratosis: Secondary | ICD-10-CM | POA: Diagnosis not present

## 2019-01-01 DIAGNOSIS — D485 Neoplasm of uncertain behavior of skin: Secondary | ICD-10-CM | POA: Diagnosis not present

## 2019-02-13 DIAGNOSIS — M170 Bilateral primary osteoarthritis of knee: Secondary | ICD-10-CM | POA: Diagnosis not present

## 2019-03-20 DIAGNOSIS — D0439 Carcinoma in situ of skin of other parts of face: Secondary | ICD-10-CM | POA: Diagnosis not present

## 2019-03-22 DIAGNOSIS — E785 Hyperlipidemia, unspecified: Secondary | ICD-10-CM | POA: Diagnosis not present

## 2019-03-22 DIAGNOSIS — J45909 Unspecified asthma, uncomplicated: Secondary | ICD-10-CM | POA: Diagnosis not present

## 2019-03-22 DIAGNOSIS — E782 Mixed hyperlipidemia: Secondary | ICD-10-CM | POA: Diagnosis not present

## 2019-03-22 DIAGNOSIS — R0602 Shortness of breath: Secondary | ICD-10-CM | POA: Diagnosis not present

## 2019-03-22 DIAGNOSIS — E669 Obesity, unspecified: Secondary | ICD-10-CM | POA: Diagnosis not present

## 2019-03-22 DIAGNOSIS — R002 Palpitations: Secondary | ICD-10-CM | POA: Diagnosis not present

## 2019-04-26 ENCOUNTER — Ambulatory Visit (INDEPENDENT_AMBULATORY_CARE_PROVIDER_SITE_OTHER): Payer: Medicare PPO

## 2019-04-26 DIAGNOSIS — Z23 Encounter for immunization: Secondary | ICD-10-CM | POA: Diagnosis not present

## 2019-05-10 DIAGNOSIS — M1711 Unilateral primary osteoarthritis, right knee: Secondary | ICD-10-CM | POA: Diagnosis not present

## 2019-05-14 ENCOUNTER — Telehealth: Payer: Self-pay | Admitting: *Deleted

## 2019-05-14 NOTE — Telephone Encounter (Signed)
Received fax for surgical clearance from [facility]. Per Dr. Glori Bickers pt needs a surgical Clearance appt. Called pt and no answer so left VM requesting pt to call the office to schedule surgical clearance appt

## 2019-05-15 NOTE — Telephone Encounter (Signed)
Surgical clearance 12/11  Surgery 1/8

## 2019-07-06 ENCOUNTER — Encounter: Payer: Self-pay | Admitting: Family Medicine

## 2019-07-06 ENCOUNTER — Other Ambulatory Visit: Payer: Self-pay

## 2019-07-06 ENCOUNTER — Ambulatory Visit (INDEPENDENT_AMBULATORY_CARE_PROVIDER_SITE_OTHER): Payer: Medicare PPO | Admitting: Family Medicine

## 2019-07-06 VITALS — BP 128/70 | HR 67 | Temp 97.4°F | Ht 64.0 in | Wt 198.3 lb

## 2019-07-06 DIAGNOSIS — I10 Essential (primary) hypertension: Secondary | ICD-10-CM

## 2019-07-06 DIAGNOSIS — Z01818 Encounter for other preprocedural examination: Secondary | ICD-10-CM | POA: Insufficient documentation

## 2019-07-06 DIAGNOSIS — E7800 Pure hypercholesterolemia, unspecified: Secondary | ICD-10-CM

## 2019-07-06 DIAGNOSIS — E669 Obesity, unspecified: Secondary | ICD-10-CM

## 2019-07-06 DIAGNOSIS — Z01810 Encounter for preprocedural cardiovascular examination: Secondary | ICD-10-CM | POA: Diagnosis not present

## 2019-07-06 DIAGNOSIS — R7303 Prediabetes: Secondary | ICD-10-CM

## 2019-07-06 LAB — CBC WITH DIFFERENTIAL/PLATELET
Basophils Absolute: 0.1 10*3/uL (ref 0.0–0.1)
Basophils Relative: 1.2 % (ref 0.0–3.0)
Eosinophils Absolute: 0.1 10*3/uL (ref 0.0–0.7)
Eosinophils Relative: 1.6 % (ref 0.0–5.0)
HCT: 45.4 % (ref 36.0–46.0)
Hemoglobin: 14.9 g/dL (ref 12.0–15.0)
Lymphocytes Relative: 30 % (ref 12.0–46.0)
Lymphs Abs: 1.8 10*3/uL (ref 0.7–4.0)
MCHC: 32.8 g/dL (ref 30.0–36.0)
MCV: 96.2 fl (ref 78.0–100.0)
Monocytes Absolute: 0.6 10*3/uL (ref 0.1–1.0)
Monocytes Relative: 9.8 % (ref 3.0–12.0)
Neutro Abs: 3.4 10*3/uL (ref 1.4–7.7)
Neutrophils Relative %: 57.4 % (ref 43.0–77.0)
Platelets: 240 10*3/uL (ref 150.0–400.0)
RBC: 4.72 Mil/uL (ref 3.87–5.11)
RDW: 13.4 % (ref 11.5–15.5)
WBC: 5.9 10*3/uL (ref 4.0–10.5)

## 2019-07-06 LAB — LDL CHOLESTEROL, DIRECT: Direct LDL: 97 mg/dL

## 2019-07-06 LAB — POC URINALSYSI DIPSTICK (AUTOMATED)
Bilirubin, UA: NEGATIVE
Blood, UA: NEGATIVE
Glucose, UA: NEGATIVE
Ketones, UA: NEGATIVE
Leukocytes, UA: NEGATIVE
Nitrite, UA: NEGATIVE
Protein, UA: NEGATIVE
Spec Grav, UA: >=1.03 — AB (ref 1.010–1.025)
Urobilinogen, UA: 0.2 E.U./dL
pH, UA: 6 (ref 5.0–8.0)

## 2019-07-06 LAB — COMPREHENSIVE METABOLIC PANEL
ALT: 12 U/L (ref 0–35)
AST: 16 U/L (ref 0–37)
Albumin: 3.9 g/dL (ref 3.5–5.2)
Alkaline Phosphatase: 83 U/L (ref 39–117)
BUN: 17 mg/dL (ref 6–23)
CO2: 28 mEq/L (ref 19–32)
Calcium: 9 mg/dL (ref 8.4–10.5)
Chloride: 105 mEq/L (ref 96–112)
Creatinine, Ser: 0.82 mg/dL (ref 0.40–1.20)
GFR: 67.17 mL/min (ref >60.00)
Glucose, Bld: 113 mg/dL — ABNORMAL HIGH (ref 70–99)
Potassium: 4.1 mEq/L (ref 3.5–5.1)
Sodium: 141 mEq/L (ref 135–145)
Total Bilirubin: 0.7 mg/dL (ref 0.2–1.2)
Total Protein: 6.7 g/dL (ref 6.0–8.3)

## 2019-07-06 LAB — LIPID PANEL
Cholesterol: 181 mg/dL (ref 0–200)
HDL: 44.1 mg/dL (ref >39.00)
NonHDL: 136.49
Total CHOL/HDL Ratio: 4
Triglycerides: 251 mg/dL — ABNORMAL HIGH (ref 0.0–149.0)
VLDL: 50.2 mg/dL — ABNORMAL HIGH (ref 0.0–40.0)

## 2019-07-06 LAB — TSH: TSH: 1.97 u[IU]/mL (ref 0.35–4.50)

## 2019-07-06 LAB — HEMOGLOBIN A1C: Hgb A1c MFr Bld: 5.9 % (ref 4.6–6.5)

## 2019-07-06 NOTE — Progress Notes (Signed)
Subjective:    Patient ID: Cheryl Joseph, female    DOB: [birth year], 78 y.o.   MRN: [id number]  This visit occurred during the SARS-CoV-2 public health emergency.  Safety protocols were in place, including screening questions prior to the visit, additional usage of staff PPE, and extensive cleaning of exam room while observing appropriate contact time as indicated for disinfecting solutions.    HPI Patient is planning R TKA on1/14/21   (OA bone on bone)  Is getting harder to walk/ steps are the worst  Dr Harlow Mares is the surgeon  Going to [facility]   Wt Readings from Last 3 Encounters:  07/06/19 198 lb 5 oz (90 kg)  08/16/18 203 lb (92.1 kg)  07/31/18 199 lb 8 oz (90.5 kg)   34.04 kg/m   Wt is down  Riding her exercise bike regularly  She likes to hike   HTN She checks it at home [id number] systolic  bp is stable today  No cp or palpitations or headaches or edema  No side effects to medicines  BP Readings from Last 3 Encounters:  07/06/19 140/66  08/16/18 126/72  07/31/18 108/70   re check BP: 128/70     Pt saw Dr Clayborn Bigness for heart palpitations in august   Thought to be due to med side effect- inhaler and anxiety  EKG today shows sinus bradycardia with rate of 59   No breathing problems at all  Has albuterol to use if cold air causes her to wheeze   Lab Results  Component Value Date   CREATININE 0.89 07/31/2018   BUN 24 (H) 07/31/2018   NA 142 07/31/2018   K 4.5 07/31/2018   CL 106 07/31/2018   CO2 29 07/31/2018   Lab Results  Component Value Date   ALT 12 07/31/2018   AST 17 07/31/2018   ALKPHOS 74 07/31/2018   BILITOT 0.6 07/31/2018    Lab Results  Component Value Date   WBC 6.1 07/31/2018   HGB 15.5 (H) 07/31/2018   HCT 45.8 07/31/2018   MCV 95.6 07/31/2018   PLT 267.0 07/31/2018    Prediabetes Lab Results  Component Value Date   HGBA1C 5.9 07/31/2018   Takes simvastatin for cholesterol    Past surgeries Anesthesia - no  problems in the past  No problems with pain medications   She is interested in starting fosamax for her osteopenia   Not taking ibuprofen recently  No aspirin  No fish oil   Patient Active Problem List   Diagnosis Date Noted  . Pre-operative general physical examination 07/06/2019  . Lower abdominal pain 08/16/2018  . Stress reaction 09/05/2017  . Estrogen deficiency 05/25/2016  . Routine general medical examination at a health care facility 11/23/2015  . Hammertoe 10/28/2015  . Frequent loose stools 02/14/2015  . Diverticulosis of colon without hemorrhage 02/14/2015  . Colon cancer screening 08/19/2014  . Essential hypertension 02/25/2014  . Encounter for Medicare annual wellness exam 07/16/2013  . Prediabetes 05/14/2009  . Osteopenia 03/07/2007  . Hyperlipidemia 01/03/2007  . Obesity (BMI 30-39.9) 01/03/2007  . ALLERGIC RHINITIS, SEASONAL 01/03/2007  . ASTHMA 01/03/2007  . OVERACTIVE BLADDER 01/03/2007   Past Medical History:  Diagnosis Date  . Anginal pain ([place])   . Arthritis    RA hands(seronegative)  . Asthma   . Back pain   . Cancer ([place])    skin  . Diabetes mellitus without complication ([place])   . HPV in female 73   HPV  with colposcopy (all neg paps since)  . Hyperlipidemia   . Obesity   . Osteopenia   . Shortness of breath dyspnea   . Shoulder pain    Past Surgical History:  Procedure Laterality Date  . COLONOSCOPY WITH PROPOFOL N/A 05/01/2015   Procedure: COLONOSCOPY WITH PROPOFOL;  Surgeon: Manya Silvas, MD;  Location: [facility] ENDOSCOPY;  Service: Endoscopy;  Laterality: N/A;  . EYE SURGERY    . pelvic organ prolapse surgery   2015  . SQUAMOUS CELL CARCINOMA EXCISION Left 12/2017   left arm  . TONSILLECTOMY    . VAGINAL HYSTERECTOMY  2015   with pelvic organ prolapse surgery    Social History   Tobacco Use  . Smoking status: Never Smoker  . Smokeless tobacco: Never Used  Substance Use Topics  . Alcohol use: No    Alcohol/week: 0.0 standard  drinks  . Drug use: No   Family History  Problem Relation Age of Onset  . Diabetes Mother   . Hyperlipidemia Mother   . Heart disease Mother   . Hypertension Mother   . Obesity Mother   . Kyphosis Mother   . Heart disease Father   . Hypertension Father   . Cancer Father        lung Cancer smoker  . Diabetes Daughter   . Kyphosis Sister   . Breast cancer Neg Hx    Allergies  Allergen Reactions  . Ezetimibe-Simvastatin     REACTION: leg pain  . Tolterodine Tartrate     REACTION: side effects  . Vesicare [Solifenacin Succinate]     Eye problems    Current Outpatient Medications on File Prior to Visit  Medication Sig Dispense Refill  . Acetaminophen (TYLENOL EXTRA STRENGTH PO) Take 1 capsule 2 (two) times daily by mouth.    Marland Kitchen albuterol (PROVENTIL HFA;VENTOLIN HFA) 108 (90 Base) MCG/ACT inhaler INHALE TWO PUFFS BY MOUTH EVERY 4 HOURS AS NEEDED FOR WHEEZING AND 2 PUFFS BEFORE EXERCISE OR EXPOSURE TO COLD AIR 18 each 2  . amLODipine (NORVASC) 5 MG tablet Take 1 tablet (5 mg total) by mouth daily. 30 tablet 11  . b complex vitamins tablet Take 1 tablet by mouth daily. Reported on 12/18/2015    . Calcium Carbonate-Vitamin D (CALCIUM-VITAMIN D3 PO) Take 1 capsule by mouth daily. Reported on 12/18/2015    . Cholecalciferol (VITAMIN D3 PO) Take 5,000 Units daily by mouth.    Marland Kitchen glucosamine-chondroitin 500-400 MG tablet Take 1 tablet by mouth daily. Reported on 12/18/2015    . Hyaluronic Acid-Vitamin C (HYALURONIC ACID PO) Take 1 tablet by mouth daily. Reported on 12/18/2015    . IBUPROFEN PO Take 1 capsule 2 (two) times daily by mouth.    . montelukast (SINGULAIR) 10 MG tablet Take 1 tablet (10 mg total) by mouth at bedtime. 90 tablet 3  . Multiple Vitamin (MULTIVITAMIN) capsule Take 1 capsule by mouth daily. Reported on 12/18/2015    . Omega-3 Fatty Acids (FISH OIL PO) Take 1 capsule by mouth daily. Reported on 12/18/2015    . OVER THE COUNTER MEDICATION BONE UP: 2 Capsules once daily    .  oxybutynin (OXYTROL) 3.9 MG/24HR Place 1 patch onto the skin every 3 (three) days. 8 patch 12  . Probiotic Product (PROBIOTIC DAILY PO) Take 1 tablet by mouth daily.    . simvastatin (ZOCOR) 40 MG tablet Take 1 tablet (40 mg total) by mouth at bedtime. 90 tablet 3  . TURMERIC PO Take 1 capsule daily  by mouth.    . Ubiquinol 100 MG CAPS Take 1 capsule by mouth 3 (three) times a week. Reported on 12/18/2015     No current facility-administered medications on file prior to visit.    Review of Systems  Constitutional: Negative for activity change, appetite change, fatigue, fever and unexpected weight change.  HENT: Negative for congestion, ear pain, rhinorrhea, sinus pressure and sore throat.   Eyes: Negative for pain, redness and visual disturbance.  Respiratory: Negative for cough, shortness of breath and wheezing.   Cardiovascular: Negative for chest pain and palpitations.  Gastrointestinal: Negative for abdominal pain, blood in stool, constipation and diarrhea.  Endocrine: Negative for polydipsia and polyuria.  Genitourinary: Negative for dysuria, frequency and urgency.  Musculoskeletal: Positive for arthralgias. Negative for back pain and myalgias.  Skin: Negative for pallor and rash.  Allergic/Immunologic: Negative for environmental allergies.  Neurological: Negative for dizziness, syncope and headaches.  Hematological: Negative for adenopathy. Does not bruise/bleed easily.  Psychiatric/Behavioral: Negative for decreased concentration and dysphoric mood. The patient is not nervous/anxious.        Objective:   Physical Exam Constitutional:      General: She is not in acute distress.    Appearance: Normal appearance. She is well-developed. She is obese. She is not ill-appearing.  HENT:     Head: Normocephalic and atraumatic.     Mouth/Throat:     Mouth: Mucous membranes are moist.     Pharynx: No posterior oropharyngeal erythema.  Eyes:     General: No scleral icterus.        Right eye: No discharge.        Left eye: No discharge.     Conjunctiva/sclera: Conjunctivae normal.     Pupils: Pupils are equal, round, and reactive to light.  Neck:     Thyroid: No thyromegaly.     Vascular: No carotid bruit or JVD.  Cardiovascular:     Rate and Rhythm: Normal rate and regular rhythm.     Pulses: Normal pulses.     Heart sounds: Normal heart sounds. No gallop.   Pulmonary:     Effort: Pulmonary effort is normal. No respiratory distress.     Breath sounds: Normal breath sounds. No stridor. No wheezing, rhonchi or rales.  Abdominal:     General: Bowel sounds are normal. There is no distension or abdominal bruit.     Palpations: Abdomen is soft. There is no mass.     Tenderness: There is no abdominal tenderness.     Hernia: No hernia is present.  Musculoskeletal:     Cervical back: Normal range of motion and neck supple. No rigidity or tenderness.     Right lower leg: No edema.     Left lower leg: No edema.     Comments: Limited rom of knees   Lymphadenopathy:     Cervical: No cervical adenopathy.  Skin:    General: Skin is warm and dry.     Coloration: Skin is not pale.     Findings: No erythema or rash.  Neurological:     Mental Status: She is alert.     Cranial Nerves: No cranial nerve deficit.     Motor: No weakness.     Coordination: Coordination normal.     Deep Tendon Reflexes: Reflexes are normal and symmetric. Reflexes normal.  Psychiatric:        Mood and Affect: Mood normal.           Assessment & Plan:  Problem List Items Addressed This Visit      Cardiovascular and Mediastinum   Essential hypertension    bp in fair control at this time  BP Readings from Last 1 Encounters:  07/06/19 128/70   No changes needed Most recent labs reviewed  Disc lifstyle change with low sodium diet and exercise        Relevant Orders   CBC w/Diff (Completed)   Comprehensive metabolic panel (Completed)   TSH (Completed)   Lipid panel (Completed)    POCT Urinalysis Dipstick (Automated) (Completed)     Other   Hyperlipidemia    Labs today Disc goals for lipids and reasons to control them Rev last labs with pt Rev low sat fat diet in detail Continues simvastatin and diet       Relevant Orders   Lipid panel (Completed)   Obesity (BMI 30-39.9)    Commended on wt loss so far with healthy diet and exercise  Continues to use exercise bike      Prediabetes    Lab today disc imp of low glycemic diet and wt loss to prevent DM2       Relevant Orders   Hemoglobin A1c (Completed)   Pre-operative general physical examination - Primary    Upcoming TKA right in  January with Dr Harlow Mares  Chronic conditions are stable  ua clear Labs today  No problems with anesthesia in the past  No problems with pain medications  Plans to consider alendronate after she recovers No nsaids or asa currently No restrictions for surgery       Relevant Orders   POCT Urinalysis Dipstick (Automated) (Completed)    Other Visit Diagnoses    Pre-operative cardiovascular examination       Relevant Orders   EKG 12-Lead (Completed)

## 2019-07-06 NOTE — Patient Instructions (Signed)
Take care of yourself  No restrictions for surgery   Continue to use your bike and eat a healthy diet  Labs today   (you can cancel your lab appt for your physical)  ua today   Stay safe   We will talk about fosamax at the next visit

## 2019-07-08 NOTE — Assessment & Plan Note (Addendum)
Labs today Disc goals for lipids and reasons to control them Rev last labs with pt Rev low sat fat diet in detail Continues simvastatin and diet

## 2019-07-08 NOTE — Assessment & Plan Note (Signed)
bp in fair control at this time  BP Readings from Last 1 Encounters:  07/06/19 128/70   No changes needed Most recent labs reviewed  Disc lifstyle change with low sodium diet and exercise

## 2019-07-08 NOTE — Assessment & Plan Note (Signed)
Commended on wt loss so far with healthy diet and exercise  Continues to use exercise bike

## 2019-07-08 NOTE — Assessment & Plan Note (Signed)
Lab today  disc imp of low glycemic diet and wt loss to prevent DM2  

## 2019-07-08 NOTE — Assessment & Plan Note (Signed)
Upcoming TKA right in  January with Dr Harlow Mares  Chronic conditions are stable  ua clear Labs today  No problems with anesthesia in the past  No problems with pain medications  Plans to consider alendronate after she recovers No nsaids or asa currently No restrictions for surgery

## 2019-07-09 ENCOUNTER — Encounter: Payer: Self-pay | Admitting: *Deleted

## 2019-07-09 DIAGNOSIS — H43813 Vitreous degeneration, bilateral: Secondary | ICD-10-CM | POA: Diagnosis not present

## 2019-08-06 DIAGNOSIS — M25661 Stiffness of right knee, not elsewhere classified: Secondary | ICD-10-CM | POA: Diagnosis not present

## 2019-08-06 DIAGNOSIS — M25561 Pain in right knee: Secondary | ICD-10-CM | POA: Diagnosis not present

## 2019-08-09 DIAGNOSIS — J45909 Unspecified asthma, uncomplicated: Secondary | ICD-10-CM | POA: Diagnosis not present

## 2019-08-09 DIAGNOSIS — E785 Hyperlipidemia, unspecified: Secondary | ICD-10-CM | POA: Diagnosis not present

## 2019-08-09 DIAGNOSIS — G8918 Other acute postprocedural pain: Secondary | ICD-10-CM | POA: Diagnosis not present

## 2019-08-09 DIAGNOSIS — M25561 Pain in right knee: Secondary | ICD-10-CM | POA: Diagnosis not present

## 2019-08-09 DIAGNOSIS — I10 Essential (primary) hypertension: Secondary | ICD-10-CM | POA: Diagnosis not present

## 2019-08-09 DIAGNOSIS — R112 Nausea with vomiting, unspecified: Secondary | ICD-10-CM | POA: Diagnosis not present

## 2019-08-09 DIAGNOSIS — M1711 Unilateral primary osteoarthritis, right knee: Secondary | ICD-10-CM | POA: Diagnosis not present

## 2019-08-09 DIAGNOSIS — Z90710 Acquired absence of both cervix and uterus: Secondary | ICD-10-CM | POA: Diagnosis not present

## 2019-08-14 DIAGNOSIS — M25561 Pain in right knee: Secondary | ICD-10-CM | POA: Diagnosis not present

## 2019-08-14 DIAGNOSIS — R600 Localized edema: Secondary | ICD-10-CM | POA: Diagnosis not present

## 2019-08-14 DIAGNOSIS — M25661 Stiffness of right knee, not elsewhere classified: Secondary | ICD-10-CM | POA: Diagnosis not present

## 2019-08-16 DIAGNOSIS — M1711 Unilateral primary osteoarthritis, right knee: Secondary | ICD-10-CM | POA: Diagnosis not present

## 2019-08-20 ENCOUNTER — Ambulatory Visit: Payer: Medicare PPO

## 2019-08-21 DIAGNOSIS — M1711 Unilateral primary osteoarthritis, right knee: Secondary | ICD-10-CM | POA: Diagnosis not present

## 2019-08-23 DIAGNOSIS — M1711 Unilateral primary osteoarthritis, right knee: Secondary | ICD-10-CM | POA: Diagnosis not present

## 2019-08-24 ENCOUNTER — Encounter: Payer: Medicare PPO | Admitting: Family Medicine

## 2019-08-28 DIAGNOSIS — M1711 Unilateral primary osteoarthritis, right knee: Secondary | ICD-10-CM | POA: Diagnosis not present

## 2019-09-04 DIAGNOSIS — M25661 Stiffness of right knee, not elsewhere classified: Secondary | ICD-10-CM | POA: Diagnosis not present

## 2019-09-04 DIAGNOSIS — M25561 Pain in right knee: Secondary | ICD-10-CM | POA: Diagnosis not present

## 2019-09-06 ENCOUNTER — Other Ambulatory Visit: Payer: Self-pay | Admitting: Family Medicine

## 2019-09-06 DIAGNOSIS — M25661 Stiffness of right knee, not elsewhere classified: Secondary | ICD-10-CM | POA: Diagnosis not present

## 2019-09-06 DIAGNOSIS — M25561 Pain in right knee: Secondary | ICD-10-CM | POA: Diagnosis not present

## 2019-09-11 DIAGNOSIS — M25661 Stiffness of right knee, not elsewhere classified: Secondary | ICD-10-CM | POA: Diagnosis not present

## 2019-09-11 DIAGNOSIS — M25561 Pain in right knee: Secondary | ICD-10-CM | POA: Diagnosis not present

## 2019-09-13 DIAGNOSIS — R600 Localized edema: Secondary | ICD-10-CM | POA: Diagnosis not present

## 2019-09-13 DIAGNOSIS — M25661 Stiffness of right knee, not elsewhere classified: Secondary | ICD-10-CM | POA: Diagnosis not present

## 2019-09-13 DIAGNOSIS — M25561 Pain in right knee: Secondary | ICD-10-CM | POA: Diagnosis not present

## 2019-09-14 ENCOUNTER — Other Ambulatory Visit: Payer: Self-pay

## 2019-09-14 ENCOUNTER — Ambulatory Visit (INDEPENDENT_AMBULATORY_CARE_PROVIDER_SITE_OTHER): Payer: Medicare HMO

## 2019-09-14 DIAGNOSIS — Z0000 Encounter for general adult medical examination without abnormal findings: Secondary | ICD-10-CM

## 2019-09-14 NOTE — Progress Notes (Signed)
Subjective:   Cheryl Joseph is a 79 y.o. female who presents for Medicare Annual (Subsequent) preventive examination.  Review of Systems: N/A   This visit is being conducted through telemedicine via telephone at the nurse health advisor's home address due to the COVID-19 pandemic. This patient has given me verbal consent via doximity to conduct this visit, patient states they are participating from their home address. Patient and myself are on the telephone call. There is no referral for this visit. Some vital signs may be absent or patient reported.    Patient identification: identified by name, DOB, and current address   Cardiac Risk Factors include: advanced age (>75men, >75 women);diabetes mellitus;hypertension;dyslipidemia     Objective:     Vitals: There were no vitals taken for this visit.  There is no height or weight on file to calculate BMI.  Advanced Directives 09/14/2019 07/31/2018 08/19/2017 06/04/2017 06/01/2017 05/31/2016 01/05/2016  Does Patient Have a Medical Advance Directive? No No No No No No No  Would patient like information on creating a medical advance directive? No - Patient declined Yes (MAU/Ambulatory/Procedural Areas - Information given) No - Patient declined No - Patient declined Yes (MAU/Ambulatory/Procedural Areas - Information given) No - patient declined information No - patient declined information    Tobacco Social History   Tobacco Use  Smoking Status Never Smoker  Smokeless Tobacco Never Used     Counseling given: Not Answered   Clinical Intake:  Pre-visit preparation completed: Yes  Pain : 0-10 Pain Score: 3  Pain Type: Chronic pain Pain Location: Knee Pain Orientation: Right Pain Descriptors / Indicators: Aching Pain Onset: More than a month ago Pain Frequency: Intermittent     Nutritional Risks: None Diabetes: Yes CBG done?: No Did pt. bring in CBG monitor from home?: No  How often do you need to have someone help you when  you read instructions, pamphlets, or other written materials from your doctor or pharmacy?: 1 - Never What is the last grade level you completed in school?: 12th  Interpreter Needed?: No  Information entered by :: CJohnson, LPN  Past Medical History:  Diagnosis Date  . Anginal pain ([place])   . Arthritis    RA hands(seronegative)  . Asthma   . Back pain   . Cancer ([place])    skin  . Diabetes mellitus without complication ([place])   . HPV in female 1996   HPV with colposcopy (all neg paps since)  . Hyperlipidemia   . Obesity   . Osteopenia   . Shortness of breath dyspnea   . Shoulder pain    Past Surgical History:  Procedure Laterality Date  . COLONOSCOPY WITH PROPOFOL N/A 05/01/2015   Procedure: COLONOSCOPY WITH PROPOFOL;  Surgeon: Manya Silvas, MD;  Location: [facility] ENDOSCOPY;  Service: Endoscopy;  Laterality: N/A;  . EYE SURGERY    . pelvic organ prolapse surgery   2015  . SQUAMOUS CELL CARCINOMA EXCISION Left 12/2017   left arm  . TONSILLECTOMY    . VAGINAL HYSTERECTOMY  2015   with pelvic organ prolapse surgery    Family History  Problem Relation Age of Onset  . Diabetes Mother   . Hyperlipidemia Mother   . Heart disease Mother   . Hypertension Mother   . Obesity Mother   . Kyphosis Mother   . Heart disease Father   . Hypertension Father   . Cancer Father        lung Cancer smoker  . Diabetes Daughter   .  Kyphosis Sister   . Breast cancer Neg Hx    Social History   Socioeconomic History  . Marital status: Married    Spouse name: Not on file  . Number of children: Not on file  . Years of education: Not on file  . Highest education level: Not on file  Occupational History  . Not on file  Tobacco Use  . Smoking status: Never Smoker  . Smokeless tobacco: Never Used  Substance and Sexual Activity  . Alcohol use: No    Alcohol/week: 0.0 standard drinks  . Drug use: No  . Sexual activity: Yes  Other Topics Concern  . Not on file  Social History Narrative   . Not on file   Social Determinants of Health   Financial Resource Strain: Low Risk   . Difficulty of Paying Living Expenses: Not hard at all  Food Insecurity: No Food Insecurity  . Worried About Charity fundraiser in the Last Year: Never true  . Ran Out of Food in the Last Year: Never true  Transportation Needs: No Transportation Needs  . Lack of Transportation (Medical): No  . Lack of Transportation (Non-Medical): No  Physical Activity: Sufficiently Active  . Days of Exercise per Week: 7 days  . Minutes of Exercise per Session: 30 min  Stress: No Stress Concern Present  . Feeling of Stress : Not at all  Social Connections:   . Frequency of Communication with Friends and Family: Not on file  . Frequency of Social Gatherings with Friends and Family: Not on file  . Attends Religious Services: Not on file  . Active Member of Clubs or Organizations: Not on file  . Attends Archivist Meetings: Not on file  . Marital Status: Not on file    Outpatient Encounter Medications as of 09/14/2019  Medication Sig  . Acetaminophen (TYLENOL EXTRA STRENGTH PO) Take 1 capsule 2 (two) times daily by mouth.  Marland Kitchen albuterol (PROVENTIL HFA;VENTOLIN HFA) 108 (90 Base) MCG/ACT inhaler INHALE TWO PUFFS BY MOUTH EVERY 4 HOURS AS NEEDED FOR WHEEZING AND 2 PUFFS BEFORE EXERCISE OR EXPOSURE TO COLD AIR  . amLODipine (NORVASC) 5 MG tablet Take 1 tablet by mouth once daily  . b complex vitamins tablet Take 1 tablet by mouth daily. Reported on 12/18/2015  . Calcium Carbonate-Vitamin D (CALCIUM-VITAMIN D3 PO) Take 1 capsule by mouth daily. Reported on 12/18/2015  . Cholecalciferol (VITAMIN D3 PO) Take 5,000 Units daily by mouth.  Marland Kitchen glucosamine-chondroitin 500-400 MG tablet Take 1 tablet by mouth daily. Reported on 12/18/2015  . Hyaluronic Acid-Vitamin C (HYALURONIC ACID PO) Take 1 tablet by mouth daily. Reported on 12/18/2015  . IBUPROFEN PO Take 1 capsule 2 (two) times daily by mouth.  . montelukast  (SINGULAIR) 10 MG tablet Take 1 tablet (10 mg total) by mouth at bedtime.  . Multiple Vitamin (MULTIVITAMIN) capsule Take 1 capsule by mouth daily. Reported on 12/18/2015  . Omega-3 Fatty Acids (FISH OIL PO) Take 1 capsule by mouth daily. Reported on 12/18/2015  . OVER THE COUNTER MEDICATION BONE UP: 2 Capsules once daily  . oxybutynin (OXYTROL) 3.9 MG/24HR Place 1 patch onto the skin every 3 (three) days.  . Probiotic Product (PROBIOTIC DAILY PO) Take 1 tablet by mouth daily.  . simvastatin (ZOCOR) 40 MG tablet Take 1 tablet (40 mg total) by mouth at bedtime.  . TURMERIC PO Take 1 capsule daily by mouth.  . Ubiquinol 100 MG CAPS Take 1 capsule by mouth 3 (three) times  a week. Reported on 12/18/2015   No facility-administered encounter medications on file as of 09/14/2019.    Activities of Daily Living In your present state of health, do you have any difficulty performing the following activities: 09/14/2019  Hearing? N  Vision? N  Difficulty concentrating or making decisions? N  Walking or climbing stairs? N  Dressing or bathing? N  Doing errands, shopping? N  Preparing Food and eating ? N  Using the Toilet? N  In the past six months, have you accidently leaked urine? Y  Comment wears pads daily  Do you have problems with loss of bowel control? N  Managing your Medications? N  Managing your Finances? N  Housekeeping or managing your Housekeeping? N  Some recent data might be hidden    Patient Care Team: Tower, Wynelle Fanny, MD as PCP - General Birder Robson, MD as Referring Physician (Ophthalmology) Garrel Ridgel, DPM as Consulting Physician (Podiatry) Yolonda Kida, MD as Consulting Physician (Cardiology)    Assessment:   This is a routine wellness examination for Ryelynn.  Exercise Activities and Dietary recommendations Current Exercise Habits: Home exercise routine, Type of exercise: strength training/weights, Time (Minutes): 30, Frequency (Times/Week): 7, Weekly Exercise  (Minutes/Week): 210, Intensity: Moderate, Exercise limited by: None identified  Goals    . Increase physical activity     Starting 07/31/2018, I will continue to exercise on stationary bike for 20 minutes daily and to walk or do strength training for 10-15 minutes 2 days per week.     . Patient Stated     09/14/2019, I will continue to exercise on stationary bike for 20 minutes daily and to walk or do strength training for 10-15 minutes.       Fall Risk Fall Risk  09/14/2019 07/31/2018 06/01/2017 05/31/2016 02/19/2016  Falls in the past year? 0 1 No Yes No  Comment - fell while hiking; stumbled on root; denies injury - fall without injury; approx. 2 mths ago; pt lost balance -  Number falls in past yr: 0 0 - 1 -  Injury with Fall? 0 0 - No -  Risk for fall due to : Medication side effect - - - -  Follow up Falls evaluation completed;Falls prevention discussed - - Falls evaluation completed -   Is the patient's home free of loose throw rugs in walkways, pet beds, electrical cords, etc?   yes      Grab bars in the bathroom? yes      Handrails on the stairs?   yes      Adequate lighting?   yes  Timed Get Up and Go performed: N/A  Depression Screen PHQ 2/9 Scores 09/14/2019 07/31/2018 06/01/2017 05/31/2016  PHQ - 2 Score 0 0 0 0  PHQ- 9 Score 0 0 0 -     Cognitive Function MMSE - Mini Mental State Exam 09/14/2019 07/31/2018 06/01/2017 05/31/2016  Orientation to time 5 5 5 5   Orientation to Place 5 5 5 5   Registration 3 3 3 3   Attention/ Calculation 5 0 0 0  Recall 3 3 3 3   Language- name 2 objects - 0 0 0  Language- repeat 1 1 1 1   Language- follow 3 step command - 3 3 3   Language- read & follow direction - 0 0 0  Write a sentence - 0 0 0  Copy design - 0 0 0  Total score - 20 20 20   Mini Cog  Mini-Cog screen was completed. Maximum score is  22. A value of 0 denotes this part of the MMSE was not completed or the patient failed this part of the Mini-Cog screening.       Immunization  History  Administered Date(s) Administered  . Fluad Quad(high Dose 65+) 04/26/2019  . Influenza Split 05/27/2011, 05/01/2012  . Influenza Whole 04/27/2007, 05/23/2008, 05/14/2009, 05/05/2010  . Influenza,inj,Quad PF,6+ Mos 05/21/2013, 04/26/2014, 05/09/2015, 05/19/2016, 04/26/2017, 05/18/2018  . Influenza,inj,quad, With Preservative 04/26/2016  . Pneumococcal Conjugate-13 08/19/2014  . Pneumococcal Polysaccharide-23 05/31/2005  . Pneumococcal-Unspecified 04/28/2015  . Td 11/24/1998, 05/14/2009  . Zoster 03/05/2008    Qualifies for Shingles Vaccine: Yes  Screening Tests Health Maintenance  Topic Date Due  . FOOT EXAM  08/05/2018  . MAMMOGRAM  09/05/2019  . TETANUS/TDAP  09/14/2023 (Originally 05/15/2019)  . HEMOGLOBIN A1C  01/04/2020  . OPHTHALMOLOGY EXAM  03/26/2020  . INFLUENZA VACCINE  Completed  . DEXA SCAN  Completed  . PNA vac Low Risk Adult  Completed    Cancer Screenings: Lung: Low Dose CT Chest recommended if Age 58-80 years, 30 pack-year currently smoking OR have quit w/in 15years. Patient does not qualify. Breast:  Up to date on Mammogram? No, declined until after knee replacement recovery   Up to date of Bone Density/Dexa? Yes, completed 09/04/2018 Colorectal: completed 05/01/2015  Additional Screenings:  Hepatitis C Screening: N/A     Plan:    Patient will continue to exercise on stationary bike for 20 minutes daily and to walk or do strength training for 10-15 minutes    I have personally reviewed and noted the following in the patient's chart:   . Medical and social history . Use of alcohol, tobacco or illicit drugs  . Current medications and supplements . Functional ability and status . Nutritional status . Physical activity . Advanced directives . List of other physicians . Hospitalizations, surgeries, and ER visits in previous 12 months . Vitals . Screenings to include cognitive, depression, and falls . Referrals and appointments  In addition, I  have reviewed and discussed with patient certain preventive protocols, quality metrics, and best practice recommendations. A written personalized care plan for preventive services as well as general preventive health recommendations were provided to patient.     Andrez Grime, LPN  [id number]

## 2019-09-14 NOTE — Patient Instructions (Signed)
Cheryl Joseph , Thank you for taking time to come for your Medicare Wellness Visit. I appreciate your ongoing commitment to your health goals. Please review the following plan we discussed and let me know if I can assist you in the future.   Screening recommendations/referrals: Colonoscopy: Up to date, completed 05/01/2015 Mammogram: declined until after knee replacement recovery Bone Density: Up to date, completed 09/04/2018 Recommended yearly ophthalmology/optometry visit for glaucoma screening and checkup Recommended yearly dental visit for hygiene and checkup  Vaccinations: Influenza vaccine: Up to date, completed 04/26/2019 Pneumococcal vaccine: Completed series Tdap vaccine: declined Shingles vaccine: discussed    Advanced directives: Advance directive discussed with you today. Even though you declined this today please call our office should you change your mind and we can give you the proper paperwork for you to fill out.  Conditions/risks identified: diabetes, hypertension, hyperlipidemia  Next appointment: 09/19/2019 @ 10:15 am    Preventive Care 65 Years and Older, Female Preventive care refers to lifestyle choices and visits with your health care provider that can promote health and wellness. What does preventive care include?  A yearly physical exam. This is also called an annual well check.  Dental exams once or twice a year.  Routine eye exams. Ask your health care provider how often you should have your eyes checked.  Personal lifestyle choices, including:  Daily care of your teeth and gums.  Regular physical activity.  Eating a healthy diet.  Avoiding tobacco and drug use.  Limiting alcohol use.  Practicing safe sex.  Taking low-dose aspirin every day.  Taking vitamin and mineral supplements as recommended by your health care provider. What happens during an annual well check? The services and screenings done by your health care provider during your annual  well check will depend on your age, overall health, lifestyle risk factors, and family history of disease. Counseling  Your health care provider may ask you questions about your:  Alcohol use.  Tobacco use.  Drug use.  Emotional well-being.  Home and relationship well-being.  Sexual activity.  Eating habits.  History of falls.  Memory and ability to understand (cognition).  Work and work Statistician.  Reproductive health. Screening  You may have the following tests or measurements:  Height, weight, and BMI.  Blood pressure.  Lipid and cholesterol levels. These may be checked every 5 years, or more frequently if you are over 85 years old.  Skin check.  Lung cancer screening. You may have this screening every year starting at age 55 if you have a 30-pack-year history of smoking and currently smoke or have quit within the past 15 years.  Fecal occult blood test (FOBT) of the stool. You may have this test every year starting at age 70.  Flexible sigmoidoscopy or colonoscopy. You may have a sigmoidoscopy every 5 years or a colonoscopy every 10 years starting at age 9.  Hepatitis C blood test.  Hepatitis B blood test.  Sexually transmitted disease (STD) testing.  Diabetes screening. This is done by checking your blood sugar (glucose) after you have not eaten for a while (fasting). You may have this done every 1-3 years.  Bone density scan. This is done to screen for osteoporosis. You may have this done starting at age 17.  Mammogram. This may be done every 1-2 years. Talk to your health care provider about how often you should have regular mammograms. Talk with your health care provider about your test results, treatment options, and if necessary, the need for  more tests. Vaccines  Your health care provider may recommend certain vaccines, such as:  Influenza vaccine. This is recommended every year.  Tetanus, diphtheria, and acellular pertussis (Tdap, Td) vaccine.  You may need a Td booster every 10 years.  Zoster vaccine. You may need this after age 39.  Pneumococcal 13-valent conjugate (PCV13) vaccine. One dose is recommended after age 35.  Pneumococcal polysaccharide (PPSV23) vaccine. One dose is recommended after age 74. Talk to your health care provider about which screenings and vaccines you need and how often you need them. This information is not intended to replace advice given to you by your health care provider. Make sure you discuss any questions you have with your health care provider. Document Released: 08/08/2015 Document Revised: 03/31/2016 Document Reviewed: 05/13/2015 Elsevier Interactive Patient Education  2017 [place] Prevention in the Home Falls can cause injuries. They can happen to people of all ages. There are many things you can do to make your home safe and to help prevent falls. What can I do on the outside of my home?  Regularly fix the edges of walkways and driveways and fix any cracks.  Remove anything that might make you trip as you walk through a door, such as a raised step or threshold.  Trim any bushes or trees on the path to your home.  Use bright outdoor lighting.  Clear any walking paths of anything that might make someone trip, such as rocks or tools.  Regularly check to see if handrails are loose or broken. Make sure that both sides of any steps have handrails.  Any raised decks and porches should have guardrails on the edges.  Have any leaves, snow, or ice cleared regularly.  Use sand or salt on walking paths during winter.  Clean up any spills in your garage right away. This includes oil or grease spills. What can I do in the bathroom?  Use night lights.  Install grab bars by the toilet and in the tub and shower. Do not use towel bars as grab bars.  Use non-skid mats or decals in the tub or shower.  If you need to sit down in the shower, use a plastic, non-slip stool.  Keep the  floor dry. Clean up any water that spills on the floor as soon as it happens.  Remove soap buildup in the tub or shower regularly.  Attach bath mats securely with double-sided non-slip rug tape.  Do not have throw rugs and other things on the floor that can make you trip. What can I do in the bedroom?  Use night lights.  Make sure that you have a light by your bed that is easy to reach.  Do not use any sheets or blankets that are too big for your bed. They should not hang down onto the floor.  Have a firm chair that has side arms. You can use this for support while you get dressed.  Do not have throw rugs and other things on the floor that can make you trip. What can I do in the kitchen?  Clean up any spills right away.  Avoid walking on wet floors.  Keep items that you use a lot in easy-to-reach places.  If you need to reach something above you, use a strong step stool that has a grab bar.  Keep electrical cords out of the way.  Do not use floor polish or wax that makes floors slippery. If you must use wax, use non-skid  floor wax.  Do not have throw rugs and other things on the floor that can make you trip. What can I do with my stairs?  Do not leave any items on the stairs.  Make sure that there are handrails on both sides of the stairs and use them. Fix handrails that are broken or loose. Make sure that handrails are as long as the stairways.  Check any carpeting to make sure that it is firmly attached to the stairs. Fix any carpet that is loose or worn.  Avoid having throw rugs at the top or bottom of the stairs. If you do have throw rugs, attach them to the floor with carpet tape.  Make sure that you have a light switch at the top of the stairs and the bottom of the stairs. If you do not have them, ask someone to add them for you. What else can I do to help prevent falls?  Wear shoes that:  Do not have high heels.  Have rubber bottoms.  Are comfortable and fit  you well.  Are closed at the toe. Do not wear sandals.  If you use a stepladder:  Make sure that it is fully opened. Do not climb a closed stepladder.  Make sure that both sides of the stepladder are locked into place.  Ask someone to hold it for you, if possible.  Clearly mark and make sure that you can see:  Any grab bars or handrails.  First and last steps.  Where the edge of each step is.  Use tools that help you move around (mobility aids) if they are needed. These include:  Canes.  Walkers.  Scooters.  Crutches.  Turn on the lights when you go into a dark area. Replace any light bulbs as soon as they burn out.  Set up your furniture so you have a clear path. Avoid moving your furniture around.  If any of your floors are uneven, fix them.  If there are any pets around you, be aware of where they are.  Review your medicines with your doctor. Some medicines can make you feel dizzy. This can increase your chance of falling. Ask your doctor what other things that you can do to help prevent falls. This information is not intended to replace advice given to you by your health care provider. Make sure you discuss any questions you have with your health care provider. Document Released: 05/08/2009 Document Revised: 12/18/2015 Document Reviewed: 08/16/2014 Elsevier Interactive Patient Education  2017 [employer].

## 2019-09-14 NOTE — Progress Notes (Signed)
PCP notes:  Health Maintenance: Mammogram- postponed until after knee replacement recovery.  foot exam- due  Abnormal Screenings: none   Patient concerns: none   Nurse concerns: none   Next PCP appt.: 09/19/2019 @ 10:15 am

## 2019-09-17 ENCOUNTER — Other Ambulatory Visit: Payer: Medicare PPO

## 2019-09-18 DIAGNOSIS — Z96651 Presence of right artificial knee joint: Secondary | ICD-10-CM | POA: Diagnosis not present

## 2019-09-19 ENCOUNTER — Other Ambulatory Visit: Payer: Self-pay

## 2019-09-19 ENCOUNTER — Encounter: Payer: Self-pay | Admitting: Family Medicine

## 2019-09-19 ENCOUNTER — Ambulatory Visit (INDEPENDENT_AMBULATORY_CARE_PROVIDER_SITE_OTHER): Payer: Medicare HMO | Admitting: Family Medicine

## 2019-09-19 VITALS — BP 118/76 | HR 76 | Temp 97.5°F | Ht 62.5 in | Wt 194.0 lb

## 2019-09-19 DIAGNOSIS — R7303 Prediabetes: Secondary | ICD-10-CM | POA: Diagnosis not present

## 2019-09-19 DIAGNOSIS — E669 Obesity, unspecified: Secondary | ICD-10-CM | POA: Diagnosis not present

## 2019-09-19 DIAGNOSIS — M059 Rheumatoid arthritis with rheumatoid factor, unspecified: Secondary | ICD-10-CM

## 2019-09-19 DIAGNOSIS — Z0000 Encounter for general adult medical examination without abnormal findings: Secondary | ICD-10-CM

## 2019-09-19 DIAGNOSIS — I10 Essential (primary) hypertension: Secondary | ICD-10-CM

## 2019-09-19 DIAGNOSIS — E7800 Pure hypercholesterolemia, unspecified: Secondary | ICD-10-CM | POA: Diagnosis not present

## 2019-09-19 DIAGNOSIS — E119 Type 2 diabetes mellitus without complications: Secondary | ICD-10-CM

## 2019-09-19 DIAGNOSIS — M8589 Other specified disorders of bone density and structure, multiple sites: Secondary | ICD-10-CM

## 2019-09-19 MED ORDER — SIMVASTATIN 40 MG PO TABS: 40.0000 mg | ORAL_TABLET | Freq: Every day | ORAL | 3 refills | Status: DC

## 2019-09-19 MED ORDER — OXYBUTYNIN 3.9 MG/24HR TD PTTW: 1.0000 | MEDICATED_PATCH | TRANSDERMAL | 12 refills | Status: DC

## 2019-09-19 MED ORDER — AMLODIPINE BESYLATE 5 MG PO TABS: 5.0000 mg | ORAL_TABLET | Freq: Every day | ORAL | 3 refills | Status: DC

## 2019-09-19 MED ORDER — MONTELUKAST SODIUM 10 MG PO TABS: 10.0000 mg | ORAL_TABLET | Freq: Every day | ORAL | 3 refills | Status: DC

## 2019-09-19 NOTE — Progress Notes (Signed)
Subjective:    Patient ID: Cheryl Joseph, female    DOB: Jan 27, 1941, 79 y.o.   MRN: [id number]  This visit occurred during the SARS-CoV-2 public health emergency.  Safety protocols were in place, including screening questions prior to the visit, additional usage of staff PPE, and extensive cleaning of exam room while observing appropriate contact time as indicated for disinfecting solutions.    HPI Here for health maintenance exam and to review chronic medical problems    Had her knee surgery- still quite a bit of pain but healing  (R knee replacement)  Using a cane  Can bend to 101 degrees so far  Generally does not feel well since her surgery  Gets cold/chilled -no fever  Hands are shaky  Not sleeping well   Incision looks good    No urinary symptoms    Next plan is to get covid vaccinations    Wt Readings from Last 3 Encounters:  09/19/19 194 lb (88 kg)  07/06/19 198 lb 5 oz (90 kg)  08/16/18 203 lb (92.1 kg)  wt is down 9 lb from January  Appetite is still not back from surgery  34.92 kg/m   Had amw on 2/29/21  Noted due for mammogram   Mammogram 2/20 -not ready to schedule  Self breast exam -no lumps   Eye exam 9/20   dexa 2/20 -osteopenia  Falls- none Fractures -none  Supplements takes ca and D  Decided against alendronate Exercise  -doing some PT for her knee s/p TKA  zostavax 8/09  Colonoscopy 10/16  bp is stable today  No cp or palpitations or headaches or edema  No side effects to medicines  BP Readings from Last 3 Encounters:  09/19/19 118/76  07/06/19 128/70  08/16/18 126/72     Pulse Readings from Last 3 Encounters:  09/19/19 76  07/06/19 67  08/16/18 65     Hyperlipidemia  Lab Results  Component Value Date   CHOL 181 07/06/2019   CHOL 197 07/31/2018   CHOL 183 05/27/2017   Lab Results  Component Value Date   HDL 44.10 07/06/2019   HDL 47.50 07/31/2018   HDL 48.70 05/27/2017   Lab Results  Component Value Date   LDLCALC 102 (H) 08/14/2014   LDLCALC 107 (H) 07/11/2013   LDLCALC 112 (H) 06/16/2012   Lab Results  Component Value Date   TRIG 251.0 (H) 07/06/2019   TRIG 238.0 (H) 07/31/2018   TRIG 202.0 (H) 05/27/2017   Lab Results  Component Value Date   CHOLHDL 4 07/06/2019   CHOLHDL 4 07/31/2018   CHOLHDL 4 05/27/2017   Lab Results  Component Value Date   LDLDIRECT 97.0 07/06/2019   LDLDIRECT 124.0 07/31/2018   LDLDIRECT 114.0 05/27/2017  simvastatin and diet  Trig are up a bit    Prediabetes Lab Results  Component Value Date   HGBA1C 5.9 07/06/2019  she knows she needs to watch sugar in her diet   Lab Results  Component Value Date   CREATININE 0.82 07/06/2019   BUN 17 07/06/2019   NA 141 07/06/2019   K 4.1 07/06/2019   CL 105 07/06/2019   CO2 28 07/06/2019   Lab Results  Component Value Date   ALT 12 07/06/2019   AST 16 07/06/2019   ALKPHOS 83 07/06/2019   BILITOT 0.7 07/06/2019    Lab Results  Component Value Date   WBC 5.9 07/06/2019   HGB 14.9 07/06/2019   HCT 45.4 07/06/2019  MCV 96.2 07/06/2019   PLT 240.0 07/06/2019    Lab Results  Component Value Date   TSH 1.97 07/06/2019    Post op- feels blue No SI  The weather also gets her down Frustrated but knows she will get better   Calls family frequently to socialize  Patient Active Problem List   Diagnosis Date Noted  . Pre-operative general physical examination 07/06/2019  . Lower abdominal pain 08/16/2018  . Stress reaction 09/05/2017  . Estrogen deficiency 05/25/2016  . Routine general medical examination at a health care facility 11/23/2015  . Hammertoe 10/28/2015  . Frequent loose stools 02/14/2015  . Diverticulosis of colon without hemorrhage 02/14/2015  . Colon cancer screening 08/19/2014  . Essential hypertension 02/25/2014  . Encounter for Medicare annual wellness exam 07/16/2013  . Prediabetes 05/14/2009  . Rheumatoid arthritis with positive rheumatoid factor ([place]) 05/13/2008  .  Osteopenia 03/07/2007  . Hyperlipidemia 01/03/2007  . Obesity (BMI 30-39.9) 01/03/2007  . ALLERGIC RHINITIS, SEASONAL 01/03/2007  . ASTHMA 01/03/2007  . OVERACTIVE BLADDER 01/03/2007   Past Medical History:  Diagnosis Date  . Anginal pain ([place])   . Arthritis    RA hands(seronegative)  . Asthma   . Back pain   . Cancer ([place])    skin  . Diabetes mellitus without complication ([place])   . HPV in female 1996   HPV with colposcopy (all neg paps since)  . Hyperlipidemia   . Obesity   . Osteopenia   . Shortness of breath dyspnea   . Shoulder pain    Past Surgical History:  Procedure Laterality Date  . COLONOSCOPY WITH PROPOFOL N/A 05/01/2015   Procedure: COLONOSCOPY WITH PROPOFOL;  Surgeon: Manya Silvas, MD;  Location[facility] ENDOSCOPY;  Service: Endoscopy;  Laterality: N/A;  . EYE SURGERY    . pelvic organ prolapse surgery   2015  . SQUAMOUS CELL CARCINOMA EXCISION Left 12/2017   left arm  . TONSILLECTOMY    . VAGINAL HYSTERECTOMY  2015   with pelvic organ prolapse surgery    Social History   Tobacco Use  . Smoking status: Never Smoker  . Smokeless tobacco: Never Used  Substance Use Topics  . Alcohol use: No    Alcohol/week: 0.0 standard drinks  . Drug use: No   Family History  Problem Relation Age of Onset  . Diabetes Mother   . Hyperlipidemia Mother   . Heart disease Mother   . Hypertension Mother   . Obesity Mother   . Kyphosis Mother   . Heart disease Father   . Hypertension Father   . Cancer Father        lung Cancer smoker  . Diabetes Daughter   . Kyphosis Sister   . Breast cancer Neg Hx    Allergies  Allergen Reactions  . Ezetimibe-Simvastatin     REACTION: leg pain  . Tolterodine Tartrate     REACTION: side effects  . Vesicare [Solifenacin Succinate]     Eye problems    Current Outpatient Medications on File Prior to Visit  Medication Sig Dispense Refill  . Acetaminophen (TYLENOL EXTRA STRENGTH PO) Take 1 capsule 2 (two) times daily by mouth.     Marland Kitchen albuterol (PROVENTIL HFA;VENTOLIN HFA) 108 (90 Base) MCG/ACT inhaler INHALE TWO PUFFS BY MOUTH EVERY 4 HOURS AS NEEDED FOR WHEEZING AND 2 PUFFS BEFORE EXERCISE OR EXPOSURE TO COLD AIR 18 each 2  . b complex vitamins tablet Take 1 tablet by mouth daily. Reported on 12/18/2015    .  Calcium Carbonate-Vitamin D (CALCIUM-VITAMIN D3 PO) Take 1 capsule by mouth daily. Reported on 12/18/2015    . Cholecalciferol (VITAMIN D3 PO) Take 5,000 Units daily by mouth.    Marland Kitchen glucosamine-chondroitin 500-400 MG tablet Take 1 tablet by mouth daily. Reported on 12/18/2015    . Hyaluronic Acid-Vitamin C (HYALURONIC ACID PO) Take 1 tablet by mouth daily. Reported on 12/18/2015    . IBUPROFEN PO Take 1 capsule 2 (two) times daily by mouth.    . Multiple Vitamin (MULTIVITAMIN) capsule Take 1 capsule by mouth daily. Reported on 12/18/2015    . Omega-3 Fatty Acids (FISH OIL PO) Take 1 capsule by mouth daily. Reported on 12/18/2015    . OVER THE COUNTER MEDICATION BONE UP: 2 Capsules once daily    . Probiotic Product (PROBIOTIC DAILY PO) Take 1 tablet by mouth daily.    . TURMERIC PO Take 1 capsule daily by mouth.    . Ubiquinol 100 MG CAPS Take 1 capsule by mouth 3 (three) times a week. Reported on 12/18/2015     No current facility-administered medications on file prior to visit.     Review of Systems  Constitutional: Positive for activity change, appetite change and fatigue. Negative for fever and unexpected weight change.  HENT: Negative for congestion, ear pain, rhinorrhea, sinus pressure and sore throat.   Eyes: Negative for pain, redness and visual disturbance.  Respiratory: Negative for cough, shortness of breath and wheezing.   Cardiovascular: Negative for chest pain and palpitations.  Gastrointestinal: Negative for abdominal pain, blood in stool, constipation and diarrhea.  Endocrine: Negative for polydipsia and polyuria.  Genitourinary: Negative for dysuria, frequency and urgency.  Musculoskeletal: Positive  for arthralgias. Negative for back pain and myalgias.       S/p R knee repl- considerable pain still  Skin: Negative for pallor and rash.  Allergic/Immunologic: Negative for environmental allergies.  Neurological: Negative for dizziness, syncope and headaches.  Hematological: Negative for adenopathy. Does not bruise/bleed easily.  Psychiatric/Behavioral: Positive for dysphoric mood. Negative for decreased concentration. The patient is not nervous/anxious.        Has felt a little down post op - was tearful initially but improving        Objective:   Physical Exam Constitutional:      General: She is not in acute distress.    Appearance: Normal appearance. She is well-developed. She is obese. She is not ill-appearing or diaphoretic.  HENT:     Head: Normocephalic and atraumatic.     Right Ear: Tympanic membrane, ear canal and external ear normal.     Left Ear: Tympanic membrane, ear canal and external ear normal.     Nose: Nose normal. No congestion.     Mouth/Throat:     Mouth: Mucous membranes are moist.     Pharynx: Oropharynx is clear. No posterior oropharyngeal erythema.  Eyes:     General: No scleral icterus.    Extraocular Movements: Extraocular movements intact.     Conjunctiva/sclera: Conjunctivae normal.     Pupils: Pupils are equal, round, and reactive to light.  Neck:     Thyroid: No thyromegaly.     Vascular: No carotid bruit or JVD.  Cardiovascular:     Rate and Rhythm: Normal rate and regular rhythm.     Pulses: Normal pulses.     Heart sounds: Normal heart sounds. No gallop.   Pulmonary:     Effort: Pulmonary effort is normal. No respiratory distress.     Breath sounds:  Normal breath sounds. No wheezing.     Comments: Good air exch Chest:     Chest wall: No tenderness.  Abdominal:     General: Bowel sounds are normal. There is no distension or abdominal bruit.     Palpations: Abdomen is soft. There is no mass.     Tenderness: There is no abdominal  tenderness.     Hernia: No hernia is present.  Genitourinary:    Comments: Breast exam deferred for next visit (cannot get on the table)   Musculoskeletal:        General: No tenderness. Normal range of motion.     Cervical back: Normal range of motion and neck supple. No rigidity. No muscular tenderness.     Right lower leg: No edema.     Left lower leg: No edema.     Comments: Mild kyphosis   Lymphadenopathy:     Cervical: No cervical adenopathy.  Skin:    General: Skin is warm and dry.     Coloration: Skin is not pale.     Findings: No erythema or rash.     Comments: Solar lentigines diffusely Some sks Fair complexion  Neurological:     Mental Status: She is alert. Mental status is at baseline.     Cranial Nerves: No cranial nerve deficit.     Motor: No abnormal muscle tone.     Coordination: Coordination normal.     Gait: Gait normal.     Deep Tendon Reflexes: Reflexes are normal and symmetric. Reflexes normal.  Psychiatric:        Mood and Affect: Mood normal.        Cognition and Memory: Cognition and memory normal.     Comments: Seems fatigued but mood is good            Assessment & Plan:   Problem List Items Addressed This Visit      Cardiovascular and Mediastinum   Essential hypertension    bp in fair control at this time  BP Readings from Last 1 Encounters:  09/19/19 118/76   No changes needed Most recent labs reviewed  Disc lifstyle change with low sodium diet and exercise  Continues amlodipine       Relevant Medications   amLODipine (NORVASC) 5 MG tablet   simvastatin (ZOCOR) 40 MG tablet     Endocrine   RESOLVED: Controlled type 2 diabetes mellitus without complication, without long-term current use of insulin (HCC)   Relevant Medications   simvastatin (ZOCOR) 40 MG tablet     Musculoskeletal and Integument   Rheumatoid arthritis with positive rheumatoid factor (HCC)    No treatment currently  Recent knee replacement      Osteopenia     dexa 2/20  Declines tx/alendronate currently  Exercise- PT after knee repl Taking ca and D No falls or fx         Other   Hyperlipidemia    Disc goals for lipids and reasons to control them Rev last labs with pt Rev low sat fat diet in detail  Fair control with simvastatin       Relevant Medications   amLODipine (NORVASC) 5 MG tablet   simvastatin (ZOCOR) 40 MG tablet   Obesity (BMI 30-39.9)    Discussed how this problem influences overall health and the risks it imposes  Reviewed plan for weight loss with lower calorie diet (via better food choices and also portion control or program like weight watchers) and exercise building up  to or more than 30 minutes 5 days per week including some aerobic activity   Working on PT after knee repl  Wt is down 9 lb  Commended and enc to keep working on it  forsees ability to be more active this summer       Prediabetes    Lab Results  Component Value Date   HGBA1C 5.9 07/06/2019   This is stable/ well controlled disc imp of low glycemic diet and wt loss to prevent DM2       Routine general medical examination at a health care facility - Primary    Reviewed health habits including diet and exercise and skin cancer prevention Reviewed appropriate screening tests for age  Also reviewed health mt list, fam hx and immunization status , as well as social and family history   See HPI Reviewed amw  Labs reviewed  Due for mammogram-pt will schedule that  Plans to get covid vaccinations  utd eye exam  Enc further wt loss

## 2019-09-19 NOTE — Assessment & Plan Note (Signed)
dexa 2/20  Declines tx/alendronate currently  Exercise- PT after knee repl Taking ca and D No falls or fx

## 2019-09-19 NOTE — Assessment & Plan Note (Signed)
Lab Results  Component Value Date   HGBA1C 5.9 07/06/2019   This is stable/ well controlled disc imp of low glycemic diet and wt loss to prevent DM2

## 2019-09-19 NOTE — Patient Instructions (Addendum)
Don't forget to schedule your mammogram at [facility] when you are ready   Get your covid vaccines when you can   Keep doing your PT  Get outdoors when weather is good  Work on projects at home to keep busy  Drink lots of fluids   Keep [place] posted with your progress

## 2019-09-19 NOTE — Assessment & Plan Note (Signed)
Reviewed health habits including diet and exercise and skin cancer prevention Reviewed appropriate screening tests for age  Also reviewed health mt list, fam hx and immunization status , as well as social and family history   See HPI Reviewed amw  Labs reviewed  Due for mammogram-pt will schedule that  Plans to get covid vaccinations  utd eye exam  Enc further wt loss

## 2019-09-19 NOTE — Assessment & Plan Note (Signed)
bp in fair control at this time  BP Readings from Last 1 Encounters:  09/19/19 118/76   No changes needed Most recent labs reviewed  Disc lifstyle change with low sodium diet and exercise  Continues amlodipine

## 2019-09-19 NOTE — Assessment & Plan Note (Signed)
No treatment currently  Recent knee replacement

## 2019-09-19 NOTE — Assessment & Plan Note (Signed)
Discussed how this problem influences overall health and the risks it imposes  Reviewed plan for weight loss with lower calorie diet (via better food choices and also portion control or program like weight watchers) and exercise building up to or more than 30 minutes 5 days per week including some aerobic activity   Working on PT after knee repl  Wt is down 9 lb  Commended and enc to keep working on it  forsees ability to be more active this summer

## 2019-09-19 NOTE — Assessment & Plan Note (Signed)
Disc goals for lipids and reasons to control them Rev last labs with pt Rev low sat fat diet in detail  Fair control with simvastatin

## 2019-09-25 DIAGNOSIS — M25661 Stiffness of right knee, not elsewhere classified: Secondary | ICD-10-CM | POA: Diagnosis not present

## 2019-09-25 DIAGNOSIS — M25561 Pain in right knee: Secondary | ICD-10-CM | POA: Diagnosis not present

## 2019-09-25 DIAGNOSIS — R600 Localized edema: Secondary | ICD-10-CM | POA: Diagnosis not present

## 2019-09-27 DIAGNOSIS — R600 Localized edema: Secondary | ICD-10-CM | POA: Diagnosis not present

## 2019-09-27 DIAGNOSIS — M25561 Pain in right knee: Secondary | ICD-10-CM | POA: Diagnosis not present

## 2019-09-27 DIAGNOSIS — M25661 Stiffness of right knee, not elsewhere classified: Secondary | ICD-10-CM | POA: Diagnosis not present

## 2019-10-01 DIAGNOSIS — D2262 Melanocytic nevi of left upper limb, including shoulder: Secondary | ICD-10-CM | POA: Diagnosis not present

## 2019-10-01 DIAGNOSIS — D2272 Melanocytic nevi of left lower limb, including hip: Secondary | ICD-10-CM | POA: Diagnosis not present

## 2019-10-01 DIAGNOSIS — D485 Neoplasm of uncertain behavior of skin: Secondary | ICD-10-CM | POA: Diagnosis not present

## 2019-10-01 DIAGNOSIS — X32XXXA Exposure to sunlight, initial encounter: Secondary | ICD-10-CM | POA: Diagnosis not present

## 2019-10-01 DIAGNOSIS — L821 Other seborrheic keratosis: Secondary | ICD-10-CM | POA: Diagnosis not present

## 2019-10-01 DIAGNOSIS — L570 Actinic keratosis: Secondary | ICD-10-CM | POA: Diagnosis not present

## 2019-10-01 DIAGNOSIS — C44311 Basal cell carcinoma of skin of nose: Secondary | ICD-10-CM | POA: Diagnosis not present

## 2019-10-01 DIAGNOSIS — D2261 Melanocytic nevi of right upper limb, including shoulder: Secondary | ICD-10-CM | POA: Diagnosis not present

## 2019-10-01 DIAGNOSIS — Z85828 Personal history of other malignant neoplasm of skin: Secondary | ICD-10-CM | POA: Diagnosis not present

## 2019-10-02 DIAGNOSIS — M25561 Pain in right knee: Secondary | ICD-10-CM | POA: Diagnosis not present

## 2019-10-02 DIAGNOSIS — M25661 Stiffness of right knee, not elsewhere classified: Secondary | ICD-10-CM | POA: Diagnosis not present

## 2019-10-02 DIAGNOSIS — R600 Localized edema: Secondary | ICD-10-CM | POA: Diagnosis not present

## 2019-10-04 DIAGNOSIS — M25661 Stiffness of right knee, not elsewhere classified: Secondary | ICD-10-CM | POA: Diagnosis not present

## 2019-10-04 DIAGNOSIS — M25561 Pain in right knee: Secondary | ICD-10-CM | POA: Diagnosis not present

## 2019-10-04 DIAGNOSIS — R600 Localized edema: Secondary | ICD-10-CM | POA: Diagnosis not present

## 2019-10-25 ENCOUNTER — Other Ambulatory Visit: Payer: Self-pay | Admitting: Family Medicine

## 2019-10-27 ENCOUNTER — Other Ambulatory Visit: Payer: Self-pay | Admitting: Family Medicine

## 2019-11-22 ENCOUNTER — Encounter: Payer: Self-pay | Admitting: Family Medicine

## 2019-11-22 DIAGNOSIS — C44311 Basal cell carcinoma of skin of nose: Secondary | ICD-10-CM | POA: Diagnosis not present

## 2019-12-03 ENCOUNTER — Other Ambulatory Visit: Payer: Self-pay | Admitting: Family Medicine

## 2019-12-10 ENCOUNTER — Other Ambulatory Visit: Payer: Self-pay | Admitting: Family Medicine

## 2019-12-10 DIAGNOSIS — Z1231 Encounter for screening mammogram for malignant neoplasm of breast: Secondary | ICD-10-CM

## 2019-12-13 ENCOUNTER — Ambulatory Visit
Admission: RE | Admit: 2019-12-13 | Discharge: 2019-12-13 | Disposition: A | Discharge status: Home / Self Care | Payer: Medicare HMO | Source: Ambulatory Visit | Attending: Family Medicine | Admitting: Family Medicine

## 2019-12-13 DIAGNOSIS — Z1231 Encounter for screening mammogram for malignant neoplasm of breast: Secondary | ICD-10-CM | POA: Diagnosis not present

## 2020-01-04 ENCOUNTER — Telehealth (INDEPENDENT_AMBULATORY_CARE_PROVIDER_SITE_OTHER): Payer: Medicare HMO | Admitting: Family Medicine

## 2020-01-04 DIAGNOSIS — K5730 Diverticulosis of large intestine without perforation or abscess without bleeding: Secondary | ICD-10-CM | POA: Diagnosis not present

## 2020-01-04 DIAGNOSIS — K529 Noninfective gastroenteritis and colitis, unspecified: Secondary | ICD-10-CM | POA: Diagnosis not present

## 2020-01-04 NOTE — Patient Instructions (Signed)
Get some citrucel over the the counter and take daily as directed  Hold stool softener if you have loose stools   Hold off on berries with seeds  Take seeds out of tomatoes  Use caution re: nuts or corn or popcorn  If not improved please let me know  If worse or new symptoms (like abd pain or fever or blood in stool) also call

## 2020-01-04 NOTE — Progress Notes (Signed)
Virtual Visit via Telephone Note  I connected with Russ Halo on 01/04/20 at  2:30 PM EDT by telephone and verified that I am speaking with the correct person using two identifiers.  Location: Patient: home Provider: office   I discussed the limitations, risks, security and privacy concerns of performing an evaluation and management service by telephone and the availability of in person appointments. I also discussed with the patient that there may be a patient responsible charge related to this service. The patient expressed understanding and agreed to proceed.  Parties involved in encounter  Patient: Cheryl Joseph  Provider:  Loura Pardon MD      History of Present Illness: Started 2 weeks ago - having to have bm every time she eats   Urgent but not loose or watery  No blood in stool  No black stool   2 incidents of bad gas pains and nausea  (sometimes when out) Gas ex helps    She takes a stool softener Also probiotic   Feels fine overall   She has a h/o diverticulosis  Has been eating strawberries and tomatoes lately   No pain except for gas   She has a rectocele as well   Last colonoscopy 10/16   Has not felt feverish  Has been somewhat tired since knee surgery -that is getting gradually better  Surgery was in January  No abx lately   Patient Active Problem List   Diagnosis Date Noted  . Frequent stools 01/04/2020  . Pre-operative general physical examination 07/06/2019  . Lower abdominal pain 08/16/2018  . Stress reaction 09/05/2017  . Estrogen deficiency 05/25/2016  . Routine general medical examination at a health care facility 11/23/2015  . Hammertoe 10/28/2015  . Frequent loose stools 02/14/2015  . Diverticulosis of colon without hemorrhage 02/14/2015  . Colon cancer screening 08/19/2014  . Essential hypertension 02/25/2014  . Encounter for Medicare annual wellness exam 07/16/2013  . Prediabetes 05/14/2009  . Rheumatoid arthritis with  positive rheumatoid factor ([place]) 05/13/2008  . Osteopenia 03/07/2007  . Hyperlipidemia 01/03/2007  . Obesity (BMI 30-39.9) 01/03/2007  . ALLERGIC RHINITIS, SEASONAL 01/03/2007  . ASTHMA 01/03/2007  . OVERACTIVE BLADDER 01/03/2007   Past Medical History:  Diagnosis Date  . Anginal pain ([place])   . Arthritis    RA hands(seronegative)  . Asthma   . Back pain   . Cancer ([place])    skin  . Diabetes mellitus without complication ([place])   . HPV in female 1996   HPV with colposcopy (all neg paps since)  . Hyperlipidemia   . Obesity   . Osteopenia   . Shortness of breath dyspnea   . Shoulder pain    Past Surgical History:  Procedure Laterality Date  . COLONOSCOPY WITH PROPOFOL N/A 05/01/2015   Procedure: COLONOSCOPY WITH PROPOFOL;  Surgeon: Manya Silvas, MD;  Location: [facility] ENDOSCOPY;  Service: Endoscopy;  Laterality: N/A;  . EYE SURGERY    . pelvic organ prolapse surgery   2015  . SQUAMOUS CELL CARCINOMA EXCISION Left 12/2017   left arm  . TONSILLECTOMY    . VAGINAL HYSTERECTOMY  2015   with pelvic organ prolapse surgery    Social History   Tobacco Use  . Smoking status: Never Smoker  . Smokeless tobacco: Never Used  Vaping Use  . Vaping Use: Never used  Substance Use Topics  . Alcohol use: No    Alcohol/week: 0.0 standard drinks  . Drug use: No   Family History  Problem Relation Age of Onset  . Diabetes Mother   . Hyperlipidemia Mother   . Heart disease Mother   . Hypertension Mother   . Obesity Mother   . Kyphosis Mother   . Heart disease Father   . Hypertension Father   . Cancer Father        lung Cancer smoker  . Diabetes Daughter   . Kyphosis Sister   . Breast cancer Neg Hx    Allergies  Allergen Reactions  . Ezetimibe-Simvastatin     REACTION: leg pain  . Tolterodine Tartrate     REACTION: side effects  . Vesicare [Solifenacin Succinate]     Eye problems    Current Outpatient Medications on File Prior to Visit  Medication Sig Dispense Refill  .  Acetaminophen (TYLENOL EXTRA STRENGTH PO) Take 1 capsule 2 (two) times daily by mouth.    Marland Kitchen albuterol (PROVENTIL HFA;VENTOLIN HFA) 108 (90 Base) MCG/ACT inhaler INHALE TWO PUFFS BY MOUTH EVERY 4 HOURS AS NEEDED FOR WHEEZING AND 2 PUFFS BEFORE EXERCISE OR EXPOSURE TO COLD AIR 18 each 2  . amLODipine (NORVASC) 5 MG tablet Take 1 tablet (5 mg total) by mouth daily. 90 tablet 3  . b complex vitamins tablet Take 1 tablet by mouth daily. Reported on 12/18/2015    . Calcium Carbonate-Vitamin D (CALCIUM-VITAMIN D3 PO) Take 1 capsule by mouth daily. Reported on 12/18/2015    . Cholecalciferol (VITAMIN D3 PO) Take 5,000 Units daily by mouth.    Marland Kitchen glucosamine-chondroitin 500-400 MG tablet Take 1 tablet by mouth daily. Reported on 12/18/2015    . Hyaluronic Acid-Vitamin C (HYALURONIC ACID PO) Take 1 tablet by mouth daily. Reported on 12/18/2015    . IBUPROFEN PO Take 1 capsule 2 (two) times daily by mouth.    . montelukast (SINGULAIR) 10 MG tablet Take 1 tablet (10 mg total) by mouth at bedtime. 90 tablet 3  . Multiple Vitamin (MULTIVITAMIN) capsule Take 1 capsule by mouth daily. Reported on 12/18/2015    . Omega-3 Fatty Acids (FISH OIL PO) Take 1 capsule by mouth daily. Reported on 12/18/2015    . OVER THE COUNTER MEDICATION BONE UP: 2 Capsules once daily    . oxybutynin (OXYTROL) 3.9 MG/24HR Place 1 patch onto the skin every 3 (three) days. 8 patch 12  . Probiotic Product (PROBIOTIC DAILY PO) Take 1 tablet by mouth daily.    . simvastatin (ZOCOR) 40 MG tablet Take 1 tablet (40 mg total) by mouth at bedtime. 90 tablet 3  . TURMERIC PO Take 1 capsule daily by mouth.    . Ubiquinol 100 MG CAPS Take 1 capsule by mouth 3 (three) times a week. Reported on 12/18/2015     No current facility-administered medications on file prior to visit.   Review of Systems  Constitutional: Negative for chills, fever and malaise/fatigue.       Fatigue since knee surgery is gradually imp  HENT: Negative for congestion, ear pain,  sinus pain and sore throat.   Eyes: Negative for blurred vision, discharge and redness.  Respiratory: Negative for cough, shortness of breath and stridor.   Cardiovascular: Negative for chest pain, palpitations and leg swelling.  Gastrointestinal: Positive for diarrhea and nausea. Negative for abdominal pain, blood in stool, constipation, heartburn, melena and vomiting.  Musculoskeletal: Negative for myalgias.  Skin: Negative for rash.  Neurological: Negative for dizziness and headaches.    Observations/Objective: Pt sounds like her normal self  Not distressed Nl voice/not hoarse  No cough/sob  heard during interview No throat clearing  Mood is good Nl cognition-mentally sharp and good historian   Assessment and Plan: Problem List Items Addressed This Visit      Digestive   Diverticulosis of colon without hemorrhage - Primary    Suspect this may be the cause of pt's frequent (sometimes loose) stools and gas pain  No focal pain or fever-doubt infection   Enc pt to take seeds out of her tomatoes before eating them Also stop berries with seeds (strawberries)  Hold off on nuts/corn/popcorn Try citrucel once daily with fluid as directed  Hold stool softener if stools are loose  Watch for abd pain/fever/vomiting or other symptoms  Update if no imp in 2 wk         Other   Frequent stools       Follow Up Instructions: Get some citrucel over the the counter and take daily as directed  Hold stool softener if you have loose stools   Hold off on berries with seeds  Take seeds out of tomatoes  Use caution re: nuts or corn or popcorn  If not improved please let me know  If worse or new symptoms (like abd pain or fever or blood in stool) also call    I discussed the assessment and treatment plan with the patient. The patient was provided an opportunity to ask questions and all were answered. The patient agreed with the plan and demonstrated an understanding of the  instructions.   The patient was advised to call back or seek an in-person evaluation if the symptoms worsen or if the condition fails to improve as anticipated.  I provided 16 minutes of non-face-to-face time during this encounter.   Loura Pardon, MD

## 2020-01-04 NOTE — Assessment & Plan Note (Signed)
Suspect this may be the cause of pt's frequent (sometimes loose) stools and gas pain  No focal pain or fever-doubt infection   Enc pt to take seeds out of her tomatoes before eating them Also stop berries with seeds (strawberries)  Hold off on nuts/corn/popcorn Try citrucel once daily with fluid as directed  Hold stool softener if stools are loose  Watch for abd pain/fever/vomiting or other symptoms  Update if no imp in 2 wk

## 2020-01-05 ENCOUNTER — Encounter: Payer: Self-pay | Admitting: Family Medicine

## 2020-01-18 ENCOUNTER — Encounter: Payer: Self-pay | Admitting: Family Medicine

## 2020-01-18 ENCOUNTER — Telehealth (INDEPENDENT_AMBULATORY_CARE_PROVIDER_SITE_OTHER): Payer: Medicare HMO | Admitting: Family Medicine

## 2020-01-18 VITALS — BP 115/61 | HR 60 | Temp 97.6°F | Ht 62.5 in

## 2020-01-18 DIAGNOSIS — R1032 Left lower quadrant pain: Secondary | ICD-10-CM | POA: Diagnosis not present

## 2020-01-18 DIAGNOSIS — K529 Noninfective gastroenteritis and colitis, unspecified: Secondary | ICD-10-CM

## 2020-01-18 NOTE — Progress Notes (Signed)
Cheryl Cardy T. Zillah Alexie, MD Primary Care and Sports Medicine [facility] [address] [place], [address] Phone: [phone]  FAX: [phone]  Cheryl Joseph - 79 y.o. female  MRN [id number]  Date of Birth: 12/26/1940  Visit Date: 01/18/2020  PCP: Cheryl Pimple, MD  Referred by: Tower, Audrie Gallus, MD  Virtual Visit via Telephone Note:  I connected with  Cheryl Joseph on 01/18/2020 11:00 AM EDT by telephone and verified that I am speaking with the correct person using two identifiers.   Location patient: home phone or cell phone Location provider: work or home office Consent: Verbal consent directly obtained from Cheryl Joseph and that there may be a patient responsible charge related to this service. Persons participating in the virtual visit: patient, provider  I discussed the limitations of evaluation and management by telemedicine and the availability of in person appointments.  The patient expressed understanding and agreed to proceed.     History of Present Illness:  This patient presents with a chronic history of diarrhea ongoing for 4 weeks.  She has tried some Metamucil, but she did not think that this is really helped.  She does have known diverticulosis.  She also has some pain in the left lower quadrant.  She denies right-sided lower quadrant pain, and she is able to eat and drink normally.  She has had intermittent diarrhea at least a couple of times a day, and sometimes this as been quite prolific.  She did recently have a total knee arthroplasty, but none of those records are available and there are outside of our hospital system.  4 weeks of diarrhea.  Metamucil.  rx by diverticulitis Side is bothering her    Review of Systems: pertinent positives and pertinent negatives as per HPI No acute distress verbally   Observations/Objective/Exam:  An attempt was made to discern vital signs over the phone and per  patient if applicable and possible.   Neurological:     Mental Status: pleasant and appropriate   Psychiatric:        Thought Content: Thought content normal.      Assessment and Plan:    ICD-10-CM   1. Chronic diarrhea  K52.9   2. Left lower quadrant pain  R10.32    Total encounter time: 30 minutes. On the day of the patient encounter, this can include review of prior records, labs, and imaging.  Additional time can include counselling, consultation with peer MD in person or by telephone.  This also includes independent review of Radiology.  Fairly extensive chart review, office notes, lab results, colonoscopy reviewed.  I attempted to find operative notes from total knee arthroplasty, but ultimately I was not able to find this.  Very difficult setting to manage chronic diarrhea as well as mild left lower quadrant abdominal pain without a physical exam.  I did my best in this setting, but if symptoms persist I recommended that she have a face-to-face evaluation with her primary care doctor.  She only took her Metamucil intermittently and did not think this helped.  I did recommend that she first thing start taking a stool binder such as Metamucil or Citrucel first daily, and then increase this to twice daily, 3 times daily, or 4 times daily to see if this helps with her chronic diarrhea.  I discussed the assessment and treatment plan with the patient. The patient was provided an opportunity to ask questions and all were  answered. The patient agreed with the plan and demonstrated an understanding of the instructions.   The patient was advised to call back or seek an in-person evaluation if the symptoms worsen or if the condition fails to improve as anticipated.  Follow-up: prn unless noted otherwise below No follow-ups on file.  No orders of the defined types were placed in this encounter.  No orders of the defined types were placed in this encounter.   Signed,  Cheryl Joseph. Cheryl Cumbo,  MD

## 2020-01-20 ENCOUNTER — Encounter: Payer: Self-pay | Admitting: Family Medicine

## 2020-03-17 DIAGNOSIS — L570 Actinic keratosis: Secondary | ICD-10-CM | POA: Diagnosis not present

## 2020-03-17 DIAGNOSIS — L821 Other seborrheic keratosis: Secondary | ICD-10-CM | POA: Diagnosis not present

## 2020-03-17 DIAGNOSIS — X32XXXA Exposure to sunlight, initial encounter: Secondary | ICD-10-CM | POA: Diagnosis not present

## 2020-03-17 DIAGNOSIS — Z08 Encounter for follow-up examination after completed treatment for malignant neoplasm: Secondary | ICD-10-CM | POA: Diagnosis not present

## 2020-03-17 DIAGNOSIS — Z85828 Personal history of other malignant neoplasm of skin: Secondary | ICD-10-CM | POA: Diagnosis not present

## 2020-03-17 DIAGNOSIS — D225 Melanocytic nevi of trunk: Secondary | ICD-10-CM | POA: Diagnosis not present

## 2020-05-01 ENCOUNTER — Other Ambulatory Visit: Payer: Self-pay

## 2020-05-01 ENCOUNTER — Ambulatory Visit (INDEPENDENT_AMBULATORY_CARE_PROVIDER_SITE_OTHER): Payer: Medicare HMO

## 2020-05-01 DIAGNOSIS — Z23 Encounter for immunization: Secondary | ICD-10-CM

## 2020-07-09 DIAGNOSIS — H02831 Dermatochalasis of right upper eyelid: Secondary | ICD-10-CM | POA: Diagnosis not present

## 2020-09-29 ENCOUNTER — Other Ambulatory Visit: Payer: Self-pay | Admitting: Family Medicine

## 2020-10-07 ENCOUNTER — Telehealth: Payer: Self-pay | Admitting: Family Medicine

## 2020-10-07 DIAGNOSIS — E7800 Pure hypercholesterolemia, unspecified: Secondary | ICD-10-CM

## 2020-10-07 DIAGNOSIS — R7303 Prediabetes: Secondary | ICD-10-CM

## 2020-10-07 DIAGNOSIS — I10 Essential (primary) hypertension: Secondary | ICD-10-CM

## 2020-10-07 NOTE — Telephone Encounter (Signed)
-----   Message from Ellamae Sia sent at 09/22/2020 11:05 AM EST ----- Regarding: Lab orders for Wednesday, 3.16.22 Patient is scheduled for CPX labs, please order future labs, Thanks , Karna Christmas

## 2020-10-08 ENCOUNTER — Other Ambulatory Visit (INDEPENDENT_AMBULATORY_CARE_PROVIDER_SITE_OTHER): Payer: Medicare HMO

## 2020-10-08 ENCOUNTER — Other Ambulatory Visit: Payer: Self-pay

## 2020-10-08 DIAGNOSIS — I10 Essential (primary) hypertension: Secondary | ICD-10-CM | POA: Diagnosis not present

## 2020-10-08 DIAGNOSIS — E7800 Pure hypercholesterolemia, unspecified: Secondary | ICD-10-CM

## 2020-10-08 DIAGNOSIS — R7303 Prediabetes: Secondary | ICD-10-CM

## 2020-10-08 LAB — LIPID PANEL
Cholesterol: 172 mg/dL (ref 0–200)
HDL: 49.7 mg/dL (ref >39.00)
LDL Cholesterol: 90 mg/dL (ref 0–99)
NonHDL: 121.87
Total CHOL/HDL Ratio: 3
Triglycerides: 161 mg/dL — ABNORMAL HIGH (ref 0.0–149.0)
VLDL: 32.2 mg/dL (ref 0.0–40.0)

## 2020-10-08 LAB — COMPREHENSIVE METABOLIC PANEL
ALT: 11 U/L (ref 0–35)
AST: 15 U/L (ref 0–37)
Albumin: 3.7 g/dL (ref 3.5–5.2)
Alkaline Phosphatase: 77 U/L (ref 39–117)
BUN: 16 mg/dL (ref 6–23)
CO2: 30 mEq/L (ref 19–32)
Calcium: 9 mg/dL (ref 8.4–10.5)
Chloride: 107 mEq/L (ref 96–112)
Creatinine, Ser: 0.81 mg/dL (ref 0.40–1.20)
GFR: 68.41 mL/min (ref >60.00)
Glucose, Bld: 114 mg/dL — ABNORMAL HIGH (ref 70–99)
Potassium: 4.5 mEq/L (ref 3.5–5.1)
Sodium: 143 mEq/L (ref 135–145)
Total Bilirubin: 0.7 mg/dL (ref 0.2–1.2)
Total Protein: 6.2 g/dL (ref 6.0–8.3)

## 2020-10-08 LAB — CBC WITH DIFFERENTIAL/PLATELET
Basophils Absolute: 0.1 10*3/uL (ref 0.0–0.1)
Basophils Relative: 0.9 % (ref 0.0–3.0)
Eosinophils Absolute: 0.2 10*3/uL (ref 0.0–0.7)
Eosinophils Relative: 2.8 % (ref 0.0–5.0)
HCT: 44.3 % (ref 36.0–46.0)
Hemoglobin: 14.7 g/dL (ref 12.0–15.0)
Lymphocytes Relative: 36.1 % (ref 12.0–46.0)
Lymphs Abs: 2.2 10*3/uL (ref 0.7–4.0)
MCHC: 33.3 g/dL (ref 30.0–36.0)
MCV: 95.5 fl (ref 78.0–100.0)
Monocytes Absolute: 0.6 10*3/uL (ref 0.1–1.0)
Monocytes Relative: 9.2 % (ref 3.0–12.0)
Neutro Abs: 3.1 10*3/uL (ref 1.4–7.7)
Neutrophils Relative %: 51 % (ref 43.0–77.0)
Platelets: 228 10*3/uL (ref 150.0–400.0)
RBC: 4.64 Mil/uL (ref 3.87–5.11)
RDW: 13.5 % (ref 11.5–15.5)
WBC: 6.2 10*3/uL (ref 4.0–10.5)

## 2020-10-08 LAB — TSH: TSH: 2.8 u[IU]/mL (ref 0.35–4.50)

## 2020-10-08 LAB — HEMOGLOBIN A1C: Hgb A1c MFr Bld: 6.2 % (ref 4.6–6.5)

## 2020-10-10 ENCOUNTER — Ambulatory Visit (INDEPENDENT_AMBULATORY_CARE_PROVIDER_SITE_OTHER): Payer: Medicare HMO

## 2020-10-10 ENCOUNTER — Other Ambulatory Visit: Payer: Self-pay

## 2020-10-10 DIAGNOSIS — Z0000 Encounter for general adult medical examination without abnormal findings: Secondary | ICD-10-CM

## 2020-10-10 NOTE — Progress Notes (Signed)
Subjective:   Cheryl Joseph is a 80 y.o. female who presents for Medicare Annual (Subsequent) preventive examination.  Review of Systems: N/A      I connected with the patient today by telephone and verified that I am speaking with the correct person using two identifiers. Location patient: home Location nurse: work Persons participating in the telephone visit: patient, nurse.   I discussed the limitations, risks, security and privacy concerns of performing an evaluation and management service by telephone and the availability of in person appointments. I also discussed with the patient that there may be a patient responsible charge related to this service. The patient expressed understanding and verbally consented to this telephonic visit.        Cardiac Risk Factors include: advanced age (>64men, >31 women);diabetes mellitus;hypertension;Other (see comment), Risk factor comments: hyperlipidemia     Objective:    Today's Vitals   10/10/20 0807  PainSc: 6    There is no height or weight on file to calculate BMI.  Advanced Directives 10/10/2020 09/14/2019 07/31/2018 08/19/2017 06/04/2017 06/01/2017 05/31/2016  Does Patient Have a Medical Advance Directive? No No No No No No No  Would patient like information on creating a medical advance directive? No - Patient declined No - Patient declined Yes (MAU/Ambulatory/Procedural Areas - Information given) No - Patient declined No - Patient declined Yes (MAU/Ambulatory/Procedural Areas - Information given) No - patient declined information    Current Medications (verified) Outpatient Encounter Medications as of 10/10/2020  Medication Sig  . Acetaminophen (TYLENOL EXTRA STRENGTH PO) Take 1 capsule 2 (two) times daily by mouth.  Marland Kitchen albuterol (PROVENTIL HFA;VENTOLIN HFA) 108 (90 Base) MCG/ACT inhaler INHALE TWO PUFFS BY MOUTH EVERY 4 HOURS AS NEEDED FOR WHEEZING AND 2 PUFFS BEFORE EXERCISE OR EXPOSURE TO COLD AIR  . amLODipine (NORVASC) 5 MG  tablet Take 1 tablet (5 mg total) by mouth daily.  Marland Kitchen b complex vitamins tablet Take 1 tablet by mouth daily. Reported on 12/18/2015  . Calcium Carbonate-Vitamin D (CALCIUM-VITAMIN D3 PO) Take 1 capsule by mouth daily. Reported on 12/18/2015  . Cholecalciferol (VITAMIN D3 PO) Take 5,000 Units daily by mouth.  Marland Kitchen glucosamine-chondroitin 500-400 MG tablet Take 1 tablet by mouth daily. Reported on 12/18/2015  . Hyaluronic Acid-Vitamin C (HYALURONIC ACID PO) Take 1 tablet by mouth daily. Reported on 12/18/2015  . IBUPROFEN PO Take 1 capsule 2 (two) times daily by mouth.  . montelukast (SINGULAIR) 10 MG tablet Take 1 tablet (10 mg total) by mouth at bedtime.  . Multiple Vitamin (MULTIVITAMIN) capsule Take 1 capsule by mouth daily. Reported on 12/18/2015  . Omega-3 Fatty Acids (FISH OIL PO) Take 1 capsule by mouth daily. Reported on 12/18/2015  . OVER THE COUNTER MEDICATION BONE UP: 2 Capsules once daily  . oxybutynin (OXYTROL) 3.9 MG/24HR Place 1 patch onto the skin every 3 (three) days.  . Probiotic Product (PROBIOTIC DAILY PO) Take 1 tablet by mouth daily.  . simvastatin (ZOCOR) 40 MG tablet TAKE 1 TABLET BY MOUTH AT BEDTIME  . TURMERIC PO Take 1 capsule daily by mouth.  . Ubiquinol 100 MG CAPS Take 1 capsule by mouth 3 (three) times a week. Reported on 12/18/2015   No facility-administered encounter medications on file as of 10/10/2020.    Allergies (verified) Ezetimibe-simvastatin, Tolterodine tartrate, and Vesicare [solifenacin succinate]   History: Past Medical History:  Diagnosis Date  . Anginal pain ([place])   . Arthritis    RA hands(seronegative)  . Asthma   . Back  pain   . Cancer ([place])    skin  . Diabetes mellitus without complication ([place])   . HPV in female 1996   HPV with colposcopy (all neg paps since)  . Hyperlipidemia   . Obesity   . Osteopenia   . Shortness of breath dyspnea   . Shoulder pain    Past Surgical History:  Procedure Laterality Date  . COLONOSCOPY WITH PROPOFOL  N/A 05/01/2015   Procedure: COLONOSCOPY WITH PROPOFOL;  Surgeon: Manya Silvas, MD;  Location: [facility] ENDOSCOPY;  Service: Endoscopy;  Laterality: N/A;  . EYE SURGERY    . pelvic organ prolapse surgery   2015  . SQUAMOUS CELL CARCINOMA EXCISION Left 12/2017   left arm  . TONSILLECTOMY    . VAGINAL HYSTERECTOMY  2015   with pelvic organ prolapse surgery    Family History  Problem Relation Age of Onset  . Diabetes Mother   . Hyperlipidemia Mother   . Heart disease Mother   . Hypertension Mother   . Obesity Mother   . Kyphosis Mother   . Heart disease Father   . Hypertension Father   . Cancer Father        lung Cancer smoker  . Diabetes Daughter   . Kyphosis Sister   . Breast cancer Neg Hx    Social History   Socioeconomic History  . Marital status: Married    Spouse name: Not on file  . Number of children: Not on file  . Years of education: Not on file  . Highest education level: Not on file  Occupational History  . Not on file  Tobacco Use  . Smoking status: Never Smoker  . Smokeless tobacco: Never Used  Vaping Use  . Vaping Use: Never used  Substance and Sexual Activity  . Alcohol use: No    Alcohol/week: 0.0 standard drinks  . Drug use: No  . Sexual activity: Yes  Other Topics Concern  . Not on file  Social History Narrative  . Not on file   Social Determinants of Health   Financial Resource Strain: Low Risk   . Difficulty of Paying Living Expenses: Not hard at all  Food Insecurity: No Food Insecurity  . Worried About Charity fundraiser in the Last Year: Never true  . Ran Out of Food in the Last Year: Never true  Transportation Needs: No Transportation Needs  . Lack of Transportation (Medical): No  . Lack of Transportation (Non-Medical): No  Physical Activity: Sufficiently Active  . Days of Exercise per Week: 7 days  . Minutes of Exercise per Session: 30 min  Stress: No Stress Concern Present  . Feeling of Stress : Not at all  Social Connections:  Not on file    Tobacco Counseling Counseling given: Not Answered   Clinical Intake:  Pre-visit preparation completed: Yes  Pain : 0-10 Pain Score: 6  Pain Type: Chronic pain Pain Location: Knee Pain Orientation: Left Pain Descriptors / Indicators: Aching Pain Onset: More than a month ago Pain Frequency: Intermittent     Nutritional Risks: None Diabetes: Yes CBG done?: No Did pt. bring in CBG monitor from home?: No  How often do you need to have someone help you when you read instructions, pamphlets, or other written materials from your doctor or pharmacy?: 1 - Never What is the last grade level you completed in school?: 12th  Diabetic: Yes Nutrition Risk Assessment:  Has the patient had any N/V/D within the last 2 months?  No  Does the patient have any non-healing wounds?  No  Has the patient had any unintentional weight loss or weight gain?  No   Diabetes:  Is the patient diabetic?  Yes  If diabetic, was a CBG obtained today?  No  telephone visit  Did the patient bring in their glucometer from home?  No telephone visit  How often do you monitor your CBG's? never.   Financial Strains and Diabetes Management:  Are you having any financial strains with the device, your supplies or your medication? No .  Does the patient want to be seen by Chronic Care Management for management of their diabetes?  No  Would the patient like to be referred to a Nutritionist or for Diabetic Management?  No   Diabetic Exams:  Diabetic Eye Exam: Completed 06/25/2020 Diabetic Foot Exam: Overdue, Pt has been advised about the importance in completing this exam. Pt is scheduled for diabetic foot exam on 10/15/2020.   Interpreter Needed?: No  Information entered by :: CJohnson, LPN   Activities of Daily Living In your present state of health, do you have any difficulty performing the following activities: 10/10/2020  Hearing? N  Vision? N  Difficulty concentrating or making  decisions? N  Walking or climbing stairs? N  Dressing or bathing? N  Doing errands, shopping? N  Preparing Food and eating ? N  Using the Toilet? N  In the past six months, have you accidently leaked urine? Y  Comment takes medications  Do you have problems with loss of bowel control? N  Managing your Medications? N  Managing your Finances? N  Housekeeping or managing your Housekeeping? N  Some recent data might be hidden    Patient Care Team: Tower, Wynelle Fanny, MD as PCP - General Birder Robson, MD as Referring Physician (Ophthalmology) Garrel Ridgel, DPM as Consulting Physician (Podiatry) Yolonda Kida, MD as Consulting Physician (Cardiology)  Indicate any recent Medical Services you may have received from other than [facility] providers in the past year (date may be approximate).     Assessment:   This is a routine wellness examination for Shaquina.  Hearing/Vision screen  Hearing Screening   125Hz  250Hz  500Hz  1000Hz  2000Hz  3000Hz  4000Hz  6000Hz  8000Hz   Right ear:           Left ear:           Vision Screening Comments: Patient gets annual eye exams   Dietary issues and exercise activities discussed: Current Exercise Habits: Home exercise routine, Type of exercise: walking;Other - see comments (stationary bike), Time (Minutes): 30, Frequency (Times/Week): 7, Weekly Exercise (Minutes/Week): 210, Intensity: Moderate, Exercise limited by: None identified  Goals    . Increase physical activity     Starting 07/31/2018, I will continue to exercise on stationary bike for 20 minutes daily and to walk or do strength training for 10-15 minutes 2 days per week.     . Patient Stated     09/14/2019, I will continue to exercise on stationary bike for 20 minutes daily and to walk or do strength training for 10-15 minutes.    . Patient Stated     10/10/2020, I will my ride stationary bike for about 20 minutes and walk 10 minutes everyday.      Depression Screen PHQ 2/9 Scores 10/10/2020  09/14/2019 07/31/2018 06/01/2017 05/31/2016 01/05/2016 08/19/2014  PHQ - 2 Score 0 0 0 0 0 0 0  PHQ- 9 Score 0 0 0 0 - - -  Fall Risk Fall Risk  10/10/2020 09/14/2019 07/31/2018 06/01/2017 05/31/2016  Falls in the past year? 1 0 1 No Yes  Comment - - fell while hiking; stumbled on root; denies injury - fall without injury; approx. 2 mths ago; pt lost balance  Number falls in past yr: 0 0 0 - 1  Injury with Fall? 0 0 0 - No  Risk for fall due to : Medication side effect Medication side effect - - -  Follow up Falls evaluation completed;Falls prevention discussed Falls evaluation completed;Falls prevention discussed - - Falls evaluation completed    FALL RISK PREVENTION PERTAINING TO THE HOME:  Any stairs in or around the home? Yes  If so, are there any without handrails? No  Home free of loose throw rugs in walkways, pet beds, electrical cords, etc? Yes  Adequate lighting in your home to reduce risk of falls? Yes   ASSISTIVE DEVICES UTILIZED TO PREVENT FALLS:  Life alert? No  Use of a cane, walker or w/c? No  Grab bars in the bathroom? No  Shower chair or bench in shower? No  Elevated toilet seat or a handicapped toilet? No   TIMED UP AND GO:  Was the test performed? N/A telephone visit .    Cognitive Function: MMSE - Mini Mental State Exam 10/10/2020 09/14/2019 07/31/2018 06/01/2017 05/31/2016  Orientation to time 5 5 5 5 5   Orientation to Place 5 5 5 5 5   Registration 3 3 3 3 3   Attention/ Calculation 5 5 0 0 0  Recall 3 3 3 3 3   Language- name 2 objects - - 0 0 0  Language- repeat 1 1 1 1 1   Language- follow 3 step command - - 3 3 3   Language- read & follow direction - - 0 0 0  Write a sentence - - 0 0 0  Copy design - - 0 0 0  Total score - - 20 20 20   Mini Cog  Mini-Cog screen was completed. Maximum score is 22. A value of 0 denotes this part of the MMSE was not completed or the patient failed this part of the Mini-Cog screening.       Immunizations Immunization History   Administered Date(s) Administered  . Fluad Quad(high Dose 65+) 04/26/2019, 05/01/2020  . Influenza Split 05/27/2011, 05/01/2012  . Influenza Whole 04/27/2007, 05/23/2008, 05/14/2009, 05/05/2010  . Influenza,inj,Quad PF,6+ Mos 05/21/2013, 04/26/2014, 05/09/2015, 05/19/2016, 04/26/2017, 05/18/2018  . Influenza,inj,quad, With Preservative 04/26/2016  . PFIZER(Purple Top)SARS-COV-2 Vaccination 09/24/2019, 10/15/2019, 05/14/2020  . Pneumococcal Conjugate-13 08/19/2014  . Pneumococcal Polysaccharide-23 05/31/2005  . Pneumococcal-Unspecified 04/28/2015  . Td 11/24/1998, 05/14/2009  . Zoster 03/05/2008    TDAP status: Due, Education has been provided regarding the importance of this vaccine. Advised may receive this vaccine at local pharmacy or Health Dept. Aware to provide a copy of the vaccination record if obtained from local pharmacy or Health Dept. Verbalized acceptance and understanding.  Flu Vaccine status: Up to date  Pneumococcal vaccine status: Up to date  Covid-19 vaccine status: Completed vaccines  Qualifies for Shingles Vaccine? Yes   Zostavax completed Yes   Shingrix Completed?: No.    Education has been provided regarding the importance of this vaccine. Patient has been advised to call insurance company to determine out of pocket expense if they have not yet received this vaccine. Advised may also receive vaccine at local pharmacy or Health Dept. Verbalized acceptance and understanding.  Screening Tests Health Maintenance  Topic Date Due  . FOOT EXAM  08/05/2018  . TETANUS/TDAP  09/14/2023 (Originally 05/15/2019)  . MAMMOGRAM  12/12/2020  . HEMOGLOBIN A1C  04/10/2021  . OPHTHALMOLOGY EXAM  06/25/2021  . INFLUENZA VACCINE  Completed  . DEXA SCAN  Completed  . COVID-19 Vaccine  Completed  . PNA vac Low Risk Adult  Completed  . HPV VACCINES  Aged Out    Health Maintenance  Health Maintenance Due  Topic Date Due  . FOOT EXAM  08/05/2018    Colorectal cancer  screening: No longer required.   Mammogram status: Completed 12/13/2019. Repeat every year  Bone Density status: due, will discuss with provider  Lung Cancer Screening: (Low Dose CT Chest recommended if Age 53-80 years, 30 pack-year currently smoking OR have quit w/in 15years.) does not qualify.    Additional Screening:  Hepatitis C Screening: does not qualify; Completed N/A  Vision Screening: Recommended annual ophthalmology exams for early detection of glaucoma and other disorders of the eye. Is the patient up to date with their annual eye exam?  Yes  Who is the provider or what is the name of the office in which the patient attends annual eye exams? Dr. Linton Flemings If pt is not established with a provider, would they like to be referred to a provider to establish care? No .   Dental Screening: Recommended annual dental exams for proper oral hygiene  Community Resource Referral / Chronic Care Management: CRR required this visit?  No   CCM required this visit?  No      Plan:     I have personally reviewed and noted the following in the patient's chart:   . Medical and social history . Use of alcohol, tobacco or illicit drugs  . Current medications and supplements . Functional ability and status . Nutritional status . Physical activity . Advanced directives . List of other physicians . Hospitalizations, surgeries, and ER visits in previous 12 months . Vitals . Screenings to include cognitive, depression, and falls . Referrals and appointments  In addition, I have reviewed and discussed with patient certain preventive protocols, quality metrics, and best practice recommendations. A written personalized care plan for preventive services as well as general preventive health recommendations were provided to patient.   Due to this being a telephonic visit, the after visit summary with patients personalized plan was offered to patient via office or my-chart. Patient preferred to  pick up at office at next visit or via mychart.   Andrez Grime, LPN   [id number]

## 2020-10-10 NOTE — Progress Notes (Signed)
PCP notes:  Health Maintenance: Dexa- due Foot exam- due   Abnormal Screenings: none   Patient concerns: Swelling in ankles Bump on back of head   Nurse concerns: none   Next PCP appt.: 10/29/2020 @ 8:30 am

## 2020-10-10 NOTE — Patient Instructions (Signed)
[name] , Thank you for taking time to come for your Medicare Wellness Visit. I appreciate your ongoing commitment to your health goals. Please review the following plan we discussed and let me know if I can assist you in the future.   Screening recommendations/referrals: Colonoscopy: no longer required  Mammogram: Up to date, completed 12/13/2019, due 02/2021 Bone Density: due, will discuss with provider Recommended yearly ophthalmology/optometry visit for glaucoma screening and checkup Recommended yearly dental visit for hygiene and checkup  Vaccinations: Influenza vaccine: Up to date, completed 05/01/2020, due 02/2021 Pneumococcal vaccine: Completed series Tdap vaccine: insurance  Shingles vaccine: due, check with your insurance regarding coverage if interested    Covid-19:Completed series  Advanced directives: Advance directive discussed with you today. Even though you declined this today please call our office should you change your mind and we can give you the proper paperwork for you to fill out.   Conditions/risks identified: diabetes, hypertension, hyperlipidemia   Next appointment: Follow up in one year for your annual wellness visit    Preventive Care 56 Years and Older, Female Preventive care refers to lifestyle choices and visits with your health care provider that can promote health and wellness. What does preventive care include?  A yearly physical exam. This is also called an annual well check.  Dental exams once or twice a year.  Routine eye exams. Ask your health care provider how often you should have your eyes checked.  Personal lifestyle choices, including:  Daily care of your teeth and gums.  Regular physical activity.  Eating a healthy diet.  Avoiding tobacco and drug use.  Limiting alcohol use.  Practicing safe sex.  Taking low-dose aspirin every day.  Taking vitamin and mineral supplements as recommended by your health care provider. What  happens during an annual well check? The services and screenings done by your health care provider during your annual well check will depend on your age, overall health, lifestyle risk factors, and family history of disease. Counseling  Your health care provider may ask you questions about your:  Alcohol use.  Tobacco use.  Drug use.  Emotional well-being.  Home and relationship well-being.  Sexual activity.  Eating habits.  History of falls.  Memory and ability to understand (cognition).  Work and work Statistician.  Reproductive health. Screening  You may have the following tests or measurements:  Height, weight, and BMI.  Blood pressure.  Lipid and cholesterol levels. These may be checked every 5 years, or more frequently if you are over 41 years old.  Skin check.  Lung cancer screening. You may have this screening every year starting at age 41 if you have a 30-pack-year history of smoking and currently smoke or have quit within the past 15 years.  Fecal occult blood test (FOBT) of the stool. You may have this test every year starting at age 51.  Flexible sigmoidoscopy or colonoscopy. You may have a sigmoidoscopy every 5 years or a colonoscopy every 10 years starting at age 7.  Hepatitis C blood test.  Hepatitis B blood test.  Sexually transmitted disease (STD) testing.  Diabetes screening. This is done by checking your blood sugar (glucose) after you have not eaten for a while (fasting). You may have this done every 1-3 years.  Bone density scan. This is done to screen for osteoporosis. You may have this done starting at age 33.  Mammogram. This may be done every 1-2 years. Talk to your health care provider about how often you  should have regular mammograms. Talk with your health care provider about your test results, treatment options, and if necessary, the need for more tests. Vaccines  Your health care provider may recommend certain vaccines, such  as:  Influenza vaccine. This is recommended every year.  Tetanus, diphtheria, and acellular pertussis (Tdap, Td) vaccine. You may need a Td booster every 10 years.  Zoster vaccine. You may need this after age 38.  Pneumococcal 13-valent conjugate (PCV13) vaccine. One dose is recommended after age 2.  Pneumococcal polysaccharide (PPSV23) vaccine. One dose is recommended after age 63. Talk to your health care provider about which screenings and vaccines you need and how often you need them. This information is not intended to replace advice given to you by your health care provider. Make sure you discuss any questions you have with your health care provider. Document Released: 08/08/2015 Document Revised: 03/31/2016 Document Reviewed: 05/13/2015 Elsevier Interactive Patient Education  2017 [employer] Prevention in the Home Falls can cause injuries. They can happen to people of all ages. There are many things you can do to make your home safe and to help prevent falls. What can I do on the outside of my home?  Regularly fix the edges of walkways and driveways and fix any cracks.  Remove anything that might make you trip as you walk through a door, such as a raised step or threshold.  Trim any bushes or trees on the path to your home.  Use bright outdoor lighting.  Clear any walking paths of anything that might make someone trip, such as rocks or tools.  Regularly check to see if handrails are loose or broken. Make sure that both sides of any steps have handrails.  Any raised decks and porches should have guardrails on the edges.  Have any leaves, snow, or ice cleared regularly.  Use sand or salt on walking paths during winter.  Clean up any spills in your garage right away. This includes oil or grease spills. What can I do in the bathroom?  Use night lights.  Install grab bars by the toilet and in the tub and shower. Do not use towel bars as grab bars.  Use  non-skid mats or decals in the tub or shower.  If you need to sit down in the shower, use a plastic, non-slip stool.  Keep the floor dry. Clean up any water that spills on the floor as soon as it happens.  Remove soap buildup in the tub or shower regularly.  Attach bath mats securely with double-sided non-slip rug tape.  Do not have throw rugs and other things on the floor that can make you trip. What can I do in the bedroom?  Use night lights.  Make sure that you have a light by your bed that is easy to reach.  Do not use any sheets or blankets that are too big for your bed. They should not hang down onto the floor.  Have a firm chair that has side arms. You can use this for support while you get dressed.  Do not have throw rugs and other things on the floor that can make you trip. What can I do in the kitchen?  Clean up any spills right away.  Avoid walking on wet floors.  Keep items that you use a lot in easy-to-reach places.  If you need to reach something above you, use a strong step stool that has a grab bar.  Keep electrical cords out  of the way.  Do not use floor polish or wax that makes floors slippery. If you must use wax, use non-skid floor wax.  Do not have throw rugs and other things on the floor that can make you trip. What can I do with my stairs?  Do not leave any items on the stairs.  Make sure that there are handrails on both sides of the stairs and use them. Fix handrails that are broken or loose. Make sure that handrails are as long as the stairways.  Check any carpeting to make sure that it is firmly attached to the stairs. Fix any carpet that is loose or worn.  Avoid having throw rugs at the top or bottom of the stairs. If you do have throw rugs, attach them to the floor with carpet tape.  Make sure that you have a light switch at the top of the stairs and the bottom of the stairs. If you do not have them, ask someone to add them for you. What  else can I do to help prevent falls?  Wear shoes that:  Do not have high heels.  Have rubber bottoms.  Are comfortable and fit you well.  Are closed at the toe. Do not wear sandals.  If you use a stepladder:  Make sure that it is fully opened. Do not climb a closed stepladder.  Make sure that both sides of the stepladder are locked into place.  Ask someone to hold it for you, if possible.  Clearly mark and make sure that you can see:  Any grab bars or handrails.  First and last steps.  Where the edge of each step is.  Use tools that help you move around (mobility aids) if they are needed. These include:  Canes.  Walkers.  Scooters.  Crutches.  Turn on the lights when you go into a dark area. Replace any light bulbs as soon as they burn out.  Set up your furniture so you have a clear path. Avoid moving your furniture around.  If any of your floors are uneven, fix them.  If there are any pets around you, be aware of where they are.  Review your medicines with your doctor. Some medicines can make you feel dizzy. This can increase your chance of falling. Ask your doctor what other things that you can do to help prevent falls. This information is not intended to replace advice given to you by your health care provider. Make sure you discuss any questions you have with your health care provider. Document Released: 05/08/2009 Document Revised: 12/18/2015 Document Reviewed: 08/16/2014 Elsevier Interactive Patient Education  2017 [employer].

## 2020-10-15 ENCOUNTER — Encounter: Payer: Medicare HMO | Admitting: Family Medicine

## 2020-10-28 ENCOUNTER — Other Ambulatory Visit: Payer: Self-pay | Admitting: Family Medicine

## 2020-10-29 ENCOUNTER — Ambulatory Visit (INDEPENDENT_AMBULATORY_CARE_PROVIDER_SITE_OTHER): Payer: Medicare HMO | Admitting: Family Medicine

## 2020-10-29 ENCOUNTER — Encounter: Payer: Self-pay | Admitting: Family Medicine

## 2020-10-29 ENCOUNTER — Other Ambulatory Visit: Payer: Self-pay

## 2020-10-29 VITALS — BP 122/66 | HR 69 | Temp 96.9°F | Ht 63.0 in | Wt 206.3 lb

## 2020-10-29 DIAGNOSIS — E669 Obesity, unspecified: Secondary | ICD-10-CM

## 2020-10-29 DIAGNOSIS — M8589 Other specified disorders of bone density and structure, multiple sites: Secondary | ICD-10-CM | POA: Diagnosis not present

## 2020-10-29 DIAGNOSIS — Z0000 Encounter for general adult medical examination without abnormal findings: Secondary | ICD-10-CM | POA: Diagnosis not present

## 2020-10-29 DIAGNOSIS — E7800 Pure hypercholesterolemia, unspecified: Secondary | ICD-10-CM | POA: Diagnosis not present

## 2020-10-29 DIAGNOSIS — I10 Essential (primary) hypertension: Secondary | ICD-10-CM

## 2020-10-29 DIAGNOSIS — R7303 Prediabetes: Secondary | ICD-10-CM

## 2020-10-29 DIAGNOSIS — M059 Rheumatoid arthritis with rheumatoid factor, unspecified: Secondary | ICD-10-CM | POA: Diagnosis not present

## 2020-10-29 MED ORDER — OXYBUTYNIN 3.9 MG/24HR TD PTTW: 1.0000 | MEDICATED_PATCH | TRANSDERMAL | 12 refills | Status: DC

## 2020-10-29 MED ORDER — SIMVASTATIN 40 MG PO TABS: 40.0000 mg | ORAL_TABLET | Freq: Every day | ORAL | 3 refills | Status: DC

## 2020-10-29 MED ORDER — AMLODIPINE BESYLATE 5 MG PO TABS: 5.0000 mg | ORAL_TABLET | Freq: Every day | ORAL | 3 refills | Status: DC

## 2020-10-29 MED ORDER — MONTELUKAST SODIUM 10 MG PO TABS: 10.0000 mg | ORAL_TABLET | Freq: Every day | ORAL | 3 refills | Status: DC

## 2020-10-29 NOTE — Patient Instructions (Addendum)
Call and remind me to put in an order for your bone density test when you schedule your mammogram   Take care of yourself   Watch the abdomen lump (I think it is a hernia)  I would like to send you to a surgeon if it gets bigger or painful  Keep [place] posted   Keep up fluids Get protein with every meal  Try to get most of your carbohydrates from produce (with the exception of white potatoes)  Eat less bread/pasta/rice/snack foods/cereals/sweets and other items from the middle of the grocery store (processed carbs)

## 2020-10-29 NOTE — Progress Notes (Signed)
Subjective:    Patient ID: Cheryl Joseph, female    DOB: 1940/11/18, 80 y.o.   MRN: [id number]  HPI Here for health maintenance exam and to review chronic medical problems    Wt Readings from Last 3 Encounters:  10/29/20 206 lb 5 oz (93.6 kg)  09/19/19 194 lb (88 kg)  07/06/19 198 lb 5 oz (90 kg)   36.55 kg/m   Feeling fair  Age related issues  Some days not a lot of energy   Had amw on 3/18  covid immunized zostavax 8/09  Mammogram 5/21 Self breast exam -no lumps or changes   dexa 2/20  Osteopenia worse at hip Falls- one fall , putting up a table and tripped  Balance issues time to time  Fractures -none Supplements-vitamin D  Good exercise   Colonoscopy 10/16 Struggles with diverticulosis  Has to avoid seeds/nuts/popcorn    HTN bp is stable today  No cp or palpitations or headaches or edema  No side effects to medicines  BP Readings from Last 3 Encounters:  10/29/20 122/66  01/18/20 115/61  09/19/19 118/76   taking amlodipine 5 mg daily    Hyperlipidemia Lab Results  Component Value Date   CHOL 172 10/08/2020   CHOL 181 07/06/2019   CHOL 197 07/31/2018   Lab Results  Component Value Date   HDL 49.70 10/08/2020   HDL 44.10 07/06/2019   HDL 47.50 07/31/2018   Lab Results  Component Value Date   LDLCALC 90 10/08/2020   LDLCALC 102 (H) 08/14/2014   LDLCALC 107 (H) 07/11/2013   Lab Results  Component Value Date   TRIG 161.0 (H) 10/08/2020   TRIG 251.0 (H) 07/06/2019   TRIG 238.0 (H) 07/31/2018   Lab Results  Component Value Date   CHOLHDL 3 10/08/2020   CHOLHDL 4 07/06/2019   CHOLHDL 4 07/31/2018   Lab Results  Component Value Date   LDLDIRECT 97.0 07/06/2019   LDLDIRECT 124.0 07/31/2018   LDLDIRECT 114.0 05/27/2017  taking simvastatin 40 mg daily Eating fairly healthy   Prediabetes Lab Results  Component Value Date   HGBA1C 6.2 10/08/2020  watching sugar intake   Lab Results  Component Value Date   WBC 6.2 10/08/2020    HGB 14.7 10/08/2020   HCT 44.3 10/08/2020   MCV 95.5 10/08/2020   PLT 228.0 10/08/2020   Lab Results  Component Value Date   CREATININE 0.81 10/08/2020   BUN 16 10/08/2020   NA 143 10/08/2020   K 4.5 10/08/2020   CL 107 10/08/2020   CO2 30 10/08/2020   Lab Results  Component Value Date   ALT 11 10/08/2020   AST 15 10/08/2020   ALKPHOS 77 10/08/2020   BILITOT 0.7 10/08/2020   Lab Results  Component Value Date   TSH 2.80 10/08/2020    Needs medicines refilled   Ankles swell  Does not bother her that much   Has seen cardiology -no h/o heart failure  She wakes up weak and shaky sometimes  Avoids caffeine generally- had some the last few days and it helped  Eating helps also  Tries to eat protein    Patient Active Problem List   Diagnosis Date Noted  . Frequent stools 01/04/2020  . Pre-operative general physical examination 07/06/2019  . Lower abdominal pain 08/16/2018  . Stress reaction 09/05/2017  . Estrogen deficiency 05/25/2016  . Routine general medical examination at a health care facility 11/23/2015  . Hammertoe 10/28/2015  . Frequent loose stools  02/14/2015  . Diverticulosis of colon without hemorrhage 02/14/2015  . Colon cancer screening 08/19/2014  . Essential hypertension 02/25/2014  . Encounter for Medicare annual wellness exam 07/16/2013  . Prediabetes 05/14/2009  . Rheumatoid arthritis with positive rheumatoid factor ([place]) 05/13/2008  . Osteopenia 03/07/2007  . Hyperlipidemia 01/03/2007  . Obesity (BMI 30-39.9) 01/03/2007  . ALLERGIC RHINITIS, SEASONAL 01/03/2007  . ASTHMA 01/03/2007  . OVERACTIVE BLADDER 01/03/2007   Past Medical History:  Diagnosis Date  . Anginal pain ([place])   . Arthritis    RA hands(seronegative)  . Asthma   . Back pain   . Cancer ([place])    skin  . Diabetes mellitus without complication ([place])   . HPV in female 1996   HPV with colposcopy (all neg paps since)  . Hyperlipidemia   . Obesity   . Osteopenia   .  Shortness of breath dyspnea   . Shoulder pain    Past Surgical History:  Procedure Laterality Date  . COLONOSCOPY WITH PROPOFOL N/A 05/01/2015   Procedure: COLONOSCOPY WITH PROPOFOL;  Surgeon: Manya Silvas, MD;  Location: [facility] ENDOSCOPY;  Service: Endoscopy;  Laterality: N/A;  . EYE SURGERY    . pelvic organ prolapse surgery   2015  . SQUAMOUS CELL CARCINOMA EXCISION Left 12/2017   left arm  . TONSILLECTOMY    . VAGINAL HYSTERECTOMY  2015   with pelvic organ prolapse surgery    Social History   Tobacco Use  . Smoking status: Never Smoker  . Smokeless tobacco: Never Used  Vaping Use  . Vaping Use: Never used  Substance Use Topics  . Alcohol use: No    Alcohol/week: 0.0 standard drinks  . Drug use: No   Family History  Problem Relation Age of Onset  . Diabetes Mother   . Hyperlipidemia Mother   . Heart disease Mother   . Hypertension Mother   . Obesity Mother   . Kyphosis Mother   . Heart disease Father   . Hypertension Father   . Cancer Father        lung Cancer smoker  . Diabetes Daughter   . Kyphosis Sister   . Breast cancer Neg Hx    Allergies  Allergen Reactions  . Ezetimibe-Simvastatin     REACTION: leg pain  . Tolterodine Tartrate     REACTION: side effects  . Vesicare [Solifenacin Succinate]     Eye problems    Current Outpatient Medications on File Prior to Visit  Medication Sig Dispense Refill  . Acetaminophen (TYLENOL EXTRA STRENGTH PO) Take 1 capsule 2 (two) times daily by mouth.    Marland Kitchen albuterol (PROVENTIL HFA;VENTOLIN HFA) 108 (90 Base) MCG/ACT inhaler INHALE TWO PUFFS BY MOUTH EVERY 4 HOURS AS NEEDED FOR WHEEZING AND 2 PUFFS BEFORE EXERCISE OR EXPOSURE TO COLD AIR 18 each 2  . b complex vitamins tablet Take 1 tablet by mouth daily. Reported on 12/18/2015    . Calcium Carbonate-Vitamin D (CALCIUM-VITAMIN D3 PO) Take 1 capsule by mouth daily. Reported on 12/18/2015    . Cholecalciferol (VITAMIN D3 PO) Take 5,000 Units daily by mouth.    Marland Kitchen  glucosamine-chondroitin 500-400 MG tablet Take 1 tablet by mouth daily. Reported on 12/18/2015    . Hyaluronic Acid-Vitamin C (HYALURONIC ACID PO) Take 1 tablet by mouth daily. Reported on 12/18/2015    . IBUPROFEN PO Take 1 capsule 2 (two) times daily by mouth.    . Multiple Vitamin (MULTIVITAMIN) capsule Take 1 capsule by mouth daily. Reported on  12/18/2015    . Omega-3 Fatty Acids (FISH OIL PO) Take 1 capsule by mouth daily. Reported on 12/18/2015    . OVER THE COUNTER MEDICATION BONE UP: 2 Capsules once daily    . Probiotic Product (PROBIOTIC DAILY PO) Take 1 tablet by mouth daily.    . TURMERIC PO Take 1 capsule daily by mouth.    . Ubiquinol 100 MG CAPS Take 1 capsule by mouth 3 (three) times a week. Reported on 12/18/2015     No current facility-administered medications on file prior to visit.    Review of Systems  Constitutional: Negative for activity change, appetite change, fatigue, fever and unexpected weight change.  HENT: Negative for congestion, ear pain, rhinorrhea, sinus pressure and sore throat.   Eyes: Negative for pain, redness and visual disturbance.  Respiratory: Negative for cough, shortness of breath and wheezing.   Cardiovascular: Negative for chest pain and palpitations.  Gastrointestinal: Negative for abdominal pain, blood in stool, constipation and diarrhea.  Endocrine: Negative for polydipsia and polyuria.  Genitourinary: Negative for dysuria, frequency and urgency.  Musculoskeletal: Negative for arthralgias, back pain and myalgias.  Skin: Negative for pallor and rash.  Allergic/Immunologic: Negative for environmental allergies.  Neurological: Negative for dizziness, syncope and headaches.  Hematological: Negative for adenopathy. Does not bruise/bleed easily.  Psychiatric/Behavioral: Negative for decreased concentration and dysphoric mood. The patient is not nervous/anxious.        Objective:   Physical Exam Constitutional:      General: She is not in acute  distress.    Appearance: Normal appearance. She is well-developed. She is obese. She is not ill-appearing or diaphoretic.  HENT:     Head: Normocephalic and atraumatic.     Right Ear: Tympanic membrane, ear canal and external ear normal.     Left Ear: Tympanic membrane, ear canal and external ear normal.     Nose: Nose normal. No congestion.     Mouth/Throat:     Mouth: Mucous membranes are moist.     Pharynx: Oropharynx is clear. No posterior oropharyngeal erythema.  Eyes:     General: No scleral icterus.    Extraocular Movements: Extraocular movements intact.     Conjunctiva/sclera: Conjunctivae normal.     Pupils: Pupils are equal, round, and reactive to light.  Neck:     Thyroid: No thyromegaly.     Vascular: No carotid bruit or JVD.  Cardiovascular:     Rate and Rhythm: Normal rate and regular rhythm.     Pulses: Normal pulses.     Heart sounds: Normal heart sounds. No gallop.   Pulmonary:     Effort: Pulmonary effort is normal. No respiratory distress.     Breath sounds: Normal breath sounds. No wheezing.     Comments: Good air exch Chest:     Chest wall: No tenderness.  Abdominal:     General: Bowel sounds are normal. There is no distension or abdominal bruit.     Palpations: Abdomen is soft. There is no mass.     Tenderness: There is no abdominal tenderness.     Hernia: A hernia is present.     Comments: Soft reducible mass around umbilicus consistent with hernia  Non tender  Genitourinary:    Comments: Breast exam: No mass, nodules, thickening, tenderness, bulging, retraction, inflamation, nipple discharge or skin changes noted.  No axillary or clavicular LA.     Musculoskeletal:        General: No tenderness. Normal range of motion.  Cervical back: Normal range of motion and neck supple. No rigidity. No muscular tenderness.     Right lower leg: No edema.     Left lower leg: No edema.     Comments: No kyphosis   Lymphadenopathy:     Cervical: No cervical  adenopathy.  Skin:    General: Skin is warm and dry.     Coloration: Skin is not pale.     Findings: No erythema or rash.     Comments: Some sks Solar lentigines diffusely   Neurological:     Mental Status: She is alert. Mental status is at baseline.     Cranial Nerves: No cranial nerve deficit.     Motor: No abnormal muscle tone.     Coordination: Coordination normal.     Gait: Gait normal.     Deep Tendon Reflexes: Reflexes are normal and symmetric. Reflexes normal.  Psychiatric:        Mood and Affect: Mood normal.        Cognition and Memory: Cognition and memory normal.           Assessment & Plan:   Problem List Items Addressed This Visit      Cardiovascular and Mediastinum   Essential hypertension    bp in fair control at this time  BP Readings from Last 1 Encounters:  10/29/20 122/66   No changes needed Most recent labs reviewed  Disc lifstyle change with low sodium diet and exercise  Plan to continue amlodipine 5 mg daily      Relevant Medications   amLODipine (NORVASC) 5 MG tablet   simvastatin (ZOCOR) 40 MG tablet     Musculoskeletal and Integument   Rheumatoid arthritis with positive rheumatoid factor (HCC)    No px tx currently  stable      Osteopenia    dexa 2/20 Pt will call to order when she schedules mammogram at the same place One fall and no fractures Disc fall prevention  Continue vit D and good exercise        Other   Hyperlipidemia    Disc goals for lipids and reasons to control them Rev last labs with pt Rev low sat fat diet in detail LDL down to 90 Plan to continue 40 mg simvastatin daily      Relevant Medications   amLODipine (NORVASC) 5 MG tablet   simvastatin (ZOCOR) 40 MG tablet   Obesity (BMI 30-39.9)    Discussed how this problem influences overall health and the risks it imposes  Reviewed plan for weight loss with lower calorie diet (via better food choices and also portion control or program like weight  watchers) and exercise building up to or more than 30 minutes 5 days per week including some aerobic activity         Prediabetes    Lab Results  Component Value Date   HGBA1C 6.2 10/08/2020   disc imp of low glycemic diet and wt loss to prevent DM2       Routine general medical examination at a health care facility - Primary    Reviewed health habits including diet and exercise and skin cancer prevention Reviewed appropriate screening tests for age  Also reviewed health mt list, fam hx and immunization status , as well as social and family history   See HPI Labs reviewed  Will plan on dexa with next mammogram  Colonoscopy utd covid immunized  Disc shingrix vaccine  Mammogram due in may and pt will  schedule No fractures, enc vit D and exercise

## 2020-10-30 ENCOUNTER — Other Ambulatory Visit: Payer: Self-pay | Admitting: Family Medicine

## 2020-10-30 NOTE — Assessment & Plan Note (Signed)
dexa 2/20 Pt will call to order when she schedules mammogram at the same place One fall and no fractures Disc fall prevention  Continue vit D and good exercise

## 2020-10-30 NOTE — Assessment & Plan Note (Signed)
Reviewed health habits including diet and exercise and skin cancer prevention Reviewed appropriate screening tests for age  Also reviewed health mt list, fam hx and immunization status , as well as social and family history   See HPI Labs reviewed  Will plan on dexa with next mammogram  Colonoscopy utd covid immunized  Disc shingrix vaccine  Mammogram due in may and pt will schedule No fractures, enc vit D and exercise

## 2020-10-30 NOTE — Assessment & Plan Note (Signed)
No px tx currently  stable

## 2020-10-30 NOTE — Assessment & Plan Note (Signed)
Lab Results  Component Value Date   HGBA1C 6.2 10/08/2020   disc imp of low glycemic diet and wt loss to prevent DM2

## 2020-10-30 NOTE — Assessment & Plan Note (Signed)
Disc goals for lipids and reasons to control them Rev last labs with pt Rev low sat fat diet in detail LDL down to 90 Plan to continue 40 mg simvastatin daily

## 2020-10-30 NOTE — Assessment & Plan Note (Signed)
Discussed how this problem influences overall health and the risks it imposes  Reviewed plan for weight loss with lower calorie diet (via better food choices and also portion control or program like weight watchers) and exercise building up to or more than 30 minutes 5 days per week including some aerobic activity    

## 2020-10-30 NOTE — Assessment & Plan Note (Signed)
bp in fair control at this time  BP Readings from Last 1 Encounters:  10/29/20 122/66   No changes needed Most recent labs reviewed  Disc lifstyle change with low sodium diet and exercise  Plan to continue amlodipine 5 mg daily

## 2020-12-11 ENCOUNTER — Telehealth: Payer: Self-pay

## 2020-12-11 DIAGNOSIS — E2839 Other primary ovarian failure: Secondary | ICD-10-CM

## 2020-12-11 NOTE — Telephone Encounter (Signed)
I ordered it  Please give her the # to schedule at [facility]

## 2020-12-11 NOTE — Telephone Encounter (Signed)
Pt called requesting order for Bone Density she stated you told her back at the annual wellness visit

## 2020-12-12 NOTE — Telephone Encounter (Signed)
Pt notified order is in she has # to call and schedule

## 2020-12-15 DIAGNOSIS — Z85828 Personal history of other malignant neoplasm of skin: Secondary | ICD-10-CM | POA: Diagnosis not present

## 2020-12-15 DIAGNOSIS — D225 Melanocytic nevi of trunk: Secondary | ICD-10-CM | POA: Diagnosis not present

## 2020-12-15 DIAGNOSIS — D2261 Melanocytic nevi of right upper limb, including shoulder: Secondary | ICD-10-CM | POA: Diagnosis not present

## 2020-12-15 DIAGNOSIS — D2262 Melanocytic nevi of left upper limb, including shoulder: Secondary | ICD-10-CM | POA: Diagnosis not present

## 2020-12-15 DIAGNOSIS — D2272 Melanocytic nevi of left lower limb, including hip: Secondary | ICD-10-CM | POA: Diagnosis not present

## 2020-12-23 ENCOUNTER — Other Ambulatory Visit: Payer: Self-pay | Admitting: Family Medicine

## 2020-12-23 DIAGNOSIS — Z1231 Encounter for screening mammogram for malignant neoplasm of breast: Secondary | ICD-10-CM

## 2020-12-31 ENCOUNTER — Other Ambulatory Visit: Payer: Self-pay

## 2020-12-31 ENCOUNTER — Ambulatory Visit
Admission: RE | Admit: 2020-12-31 | Discharge: 2020-12-31 | Disposition: A | Discharge status: Home / Self Care | Payer: Medicare HMO | Source: Ambulatory Visit | Attending: Family Medicine | Admitting: Family Medicine

## 2020-12-31 DIAGNOSIS — E2839 Other primary ovarian failure: Secondary | ICD-10-CM | POA: Insufficient documentation

## 2020-12-31 DIAGNOSIS — M8589 Other specified disorders of bone density and structure, multiple sites: Secondary | ICD-10-CM | POA: Diagnosis not present

## 2020-12-31 DIAGNOSIS — Z1231 Encounter for screening mammogram for malignant neoplasm of breast: Secondary | ICD-10-CM | POA: Insufficient documentation

## 2021-01-05 ENCOUNTER — Other Ambulatory Visit: Payer: Self-pay

## 2021-01-05 ENCOUNTER — Encounter: Payer: Self-pay | Admitting: Family Medicine

## 2021-01-05 ENCOUNTER — Ambulatory Visit (INDEPENDENT_AMBULATORY_CARE_PROVIDER_SITE_OTHER): Payer: Medicare HMO | Admitting: Family Medicine

## 2021-01-05 VITALS — BP 136/74 | HR 62 | Temp 96.9°F | Ht 63.0 in | Wt 205.4 lb

## 2021-01-05 DIAGNOSIS — R1030 Lower abdominal pain, unspecified: Secondary | ICD-10-CM

## 2021-01-05 DIAGNOSIS — K5730 Diverticulosis of large intestine without perforation or abscess without bleeding: Secondary | ICD-10-CM

## 2021-01-05 DIAGNOSIS — K429 Umbilical hernia without obstruction or gangrene: Secondary | ICD-10-CM

## 2021-01-05 LAB — POC URINALSYSI DIPSTICK (AUTOMATED)
Bilirubin, UA: NEGATIVE
Blood, UA: NEGATIVE
Glucose, UA: NEGATIVE
Ketones, UA: 5
Leukocytes, UA: NEGATIVE
Nitrite, UA: NEGATIVE
Protein, UA: NEGATIVE
Spec Grav, UA: >=1.03 — AB (ref 1.010–1.025)
Urobilinogen, UA: 0.2 E.U./dL
pH, UA: 5.5 (ref 5.0–8.0)

## 2021-01-05 NOTE — Assessment & Plan Note (Signed)
Suspect recent episode of LLQ pain was from a diverticular source Symptoms are resolved  Will continue to watch diet and add fiber sup daily

## 2021-01-05 NOTE — Patient Instructions (Signed)
Drink more water  Try taking a fiber supplement like citrucel or metamucil daily as directed (store brand is fine) Probiotic is ok   If symptoms return or if blood in stool let us know  Urine is concentrated- drink more water!   Take care of yourself

## 2021-01-05 NOTE — Assessment & Plan Note (Addendum)
This is a recurrent problem and latest episode is now totally resolved  ua clear but concentrated-stressed imp of more water intake  Suspect diverticular source inst pt to get a fiber supplement and use with water daily , also continue to avoid nuts and seeds and popcorn  Adv diet slowly and update asap if this returns  Also if she develops any pain around the umbilical hernia or if her rectocele prevents nl passage of stool

## 2021-01-05 NOTE — Assessment & Plan Note (Signed)
No tenderness No change in size Entirely reducible  Disc s/s to watch for

## 2021-01-05 NOTE — Progress Notes (Signed)
Subjective:    Patient ID: Cheryl Joseph, female    DOB: 03-Jun-1941, 80 y.o.   MRN: [id number]  This visit occurred during the SARS-CoV-2 public health emergency.  Safety protocols were in place, including screening questions prior to the visit, additional usage of staff PPE, and extensive cleaning of exam room while observing appropriate contact time as indicated for disinfecting solutions.   HPI Pt presents for c/o rectocele  with low back and abd pain   Wt Readings from Last 3 Encounters:  01/05/21 205 lb 6 oz (93.2 kg)  10/29/20 206 lb 5 oz (93.6 kg)  09/19/19 194 lb (88 kg)   36.38 kg/m  Pt presents with episode of low abd/back pain  Has had briefly in the past  Hurt all day Monday and Tuesday  Some pain centrally in LLQ  Felt terrible as well  Now feels fine   She had some diarrhea a few times with this No blood  Took some pepto   Has h/o diverticulosis   Has a rectocele and deals with it   No change in diet  No nuts or seeds or popcorn    Has umbilical hernia  Not bothering her   Ua today  Results for orders placed or performed in visit on 01/05/21  POCT Urinalysis Dipstick (Automated)  Result Value Ref Range   Color, UA Dark yellow    Clarity, UA Clear    Glucose, UA Negative Negative   Bilirubin, UA Negative    Ketones, UA 5 mg/dL    Spec Grav, UA >=1.030 (A) 1.010 - 1.025   Blood, UA Negative    pH, UA 5.5 5.0 - 8.0   Protein, UA Negative Negative   Urobilinogen, UA 0.2 0.2 or 1.0 E.U./dL   Nitrite, UA Negative    Leukocytes, UA Negative Negative    Last colonoscopy 10/16-normal with tics/reviewed   Lab Results  Component Value Date   WBC 6.2 10/08/2020   HGB 14.7 10/08/2020   HCT 44.3 10/08/2020   MCV 95.5 10/08/2020   PLT 228.0 10/08/2020   Lab Results  Component Value Date   CREATININE 0.81 10/08/2020   BUN 16 10/08/2020   NA 143 10/08/2020   K 4.5 10/08/2020   CL 107 10/08/2020   CO2 30 10/08/2020    Patient Active  Problem List   Diagnosis Date Noted   Umbilical hernia 05/11/5101   Frequent stools 01/04/2020   Pre-operative general physical examination 07/06/2019   Lower abdominal pain 08/16/2018   Stress reaction 09/05/2017   Estrogen deficiency 05/25/2016   Routine general medical examination at a health care facility 11/23/2015   Hammertoe 10/28/2015   Frequent loose stools 02/14/2015   Diverticulosis of colon without hemorrhage 02/14/2015   Colon cancer screening 08/19/2014   Essential hypertension 02/25/2014   Encounter for Medicare annual wellness exam 07/16/2013   Prediabetes 05/14/2009   Rheumatoid arthritis with positive rheumatoid factor ([place]) 05/13/2008   Osteopenia 03/07/2007   Hyperlipidemia 01/03/2007   Obesity (BMI 30-39.9) 01/03/2007   ALLERGIC RHINITIS, SEASONAL 01/03/2007   ASTHMA 01/03/2007   OVERACTIVE BLADDER 01/03/2007   Past Medical History:  Diagnosis Date   Anginal pain (HCC)    Arthritis    RA hands(seronegative)   Asthma    Back pain    Cancer ([place])    skin   Diabetes mellitus without complication ([place])    HPV in female 1996   HPV with colposcopy (all neg paps since)   Hyperlipidemia  Obesity    Osteopenia    Shortness of breath dyspnea    Shoulder pain    Past Surgical History:  Procedure Laterality Date   COLONOSCOPY WITH PROPOFOL N/A 05/01/2015   Procedure: COLONOSCOPY WITH PROPOFOL;  Surgeon: Manya Silvas, MD;  Location: [facility] ENDOSCOPY;  Service: Endoscopy;  Laterality: N/A;   EYE SURGERY     pelvic organ prolapse surgery   2015   SQUAMOUS CELL CARCINOMA EXCISION Left 12/2017   left arm   TONSILLECTOMY     VAGINAL HYSTERECTOMY  2015   with pelvic organ prolapse surgery    Social History   Tobacco Use   Smoking status: Never   Smokeless tobacco: Never  Vaping Use   Vaping Use: Never used  Substance Use Topics   Alcohol use: No    Alcohol/week: 0.0 standard drinks   Drug use: No   Family History  Problem Relation Age of Onset    Diabetes Mother    Hyperlipidemia Mother    Heart disease Mother    Hypertension Mother    Obesity Mother    Kyphosis Mother    Heart disease Father    Hypertension Father    Cancer Father        lung Cancer smoker   Diabetes Daughter    Kyphosis Sister    Breast cancer Neg Hx    Allergies  Allergen Reactions   Ezetimibe-Simvastatin     REACTION: leg pain   Tolterodine Tartrate     REACTION: side effects   Vesicare [Solifenacin Succinate]     Eye problems    Current Outpatient Medications on File Prior to Visit  Medication Sig Dispense Refill   Acetaminophen (TYLENOL EXTRA STRENGTH PO) Take 1 capsule 2 (two) times daily by mouth.     albuterol (PROVENTIL HFA;VENTOLIN HFA) 108 (90 Base) MCG/ACT inhaler INHALE TWO PUFFS BY MOUTH EVERY 4 HOURS AS NEEDED FOR WHEEZING AND 2 PUFFS BEFORE EXERCISE OR EXPOSURE TO COLD AIR 18 each 2   amLODipine (NORVASC) 5 MG tablet Take 1 tablet (5 mg total) by mouth daily. 90 tablet 3   b complex vitamins tablet Take 1 tablet by mouth daily. Reported on 12/18/2015     Calcium Carbonate-Vitamin D (CALCIUM-VITAMIN D3 PO) Take 1 capsule by mouth daily. Reported on 12/18/2015     Cholecalciferol (VITAMIN D3 PO) Take 5,000 Units daily by mouth.     glucosamine-chondroitin 500-400 MG tablet Take 1 tablet by mouth daily. Reported on 12/18/2015     Hyaluronic Acid-Vitamin C (HYALURONIC ACID PO) Take 1 tablet by mouth daily. Reported on 12/18/2015     IBUPROFEN PO Take 1 capsule 2 (two) times daily by mouth.     montelukast (SINGULAIR) 10 MG tablet Take 1 tablet (10 mg total) by mouth at bedtime. 90 tablet 3   Multiple Vitamin (MULTIVITAMIN) capsule Take 1 capsule by mouth daily. Reported on 12/18/2015     Omega-3 Fatty Acids (FISH OIL PO) Take 1 capsule by mouth daily. Reported on 12/18/2015     OVER THE COUNTER MEDICATION BONE UP: 2 Capsules once daily     oxybutynin (OXYTROL) 3.9 MG/24HR Place 1 patch onto the skin every 3 (three) days. 8 patch 12   Probiotic  Product (PROBIOTIC DAILY PO) Take 1 tablet by mouth daily.     simvastatin (ZOCOR) 40 MG tablet Take 1 tablet (40 mg total) by mouth at bedtime. 90 tablet 3   TURMERIC PO Take 1 capsule daily by mouth.  Ubiquinol 100 MG CAPS Take 1 capsule by mouth 3 (three) times a week. Reported on 12/18/2015     No current facility-administered medications on file prior to visit.     Review of Systems  Constitutional:  Negative for activity change, appetite change, fatigue, fever and unexpected weight change.  HENT:  Negative for congestion, ear pain, rhinorrhea, sinus pressure and sore throat.   Eyes:  Negative for pain, redness and visual disturbance.  Respiratory:  Negative for cough, shortness of breath and wheezing.   Cardiovascular:  Negative for chest pain and palpitations.  Gastrointestinal:  Positive for abdominal pain. Negative for abdominal distention, anal bleeding, blood in stool, constipation, diarrhea, nausea, rectal pain and vomiting.  Endocrine: Negative for polydipsia and polyuria.  Genitourinary:  Negative for dysuria, frequency and urgency.  Musculoskeletal:  Negative for arthralgias, back pain and myalgias.  Skin:  Negative for pallor and rash.  Allergic/Immunologic: Negative for environmental allergies.  Neurological:  Negative for dizziness, syncope and headaches.  Hematological:  Negative for adenopathy. Does not bruise/bleed easily.  Psychiatric/Behavioral:  Negative for decreased concentration and dysphoric mood. The patient is not nervous/anxious.       Objective:   Physical Exam Constitutional:      General: She is not in acute distress.    Appearance: Normal appearance. She is well-developed. She is obese. She is not ill-appearing.  HENT:     Head: Normocephalic and atraumatic.  Eyes:     Conjunctiva/sclera: Conjunctivae normal.     Pupils: Pupils are equal, round, and reactive to light.  Neck:     Thyroid: No thyromegaly.     Vascular: No carotid bruit or JVD.   Cardiovascular:     Rate and Rhythm: Normal rate and regular rhythm.     Heart sounds: Normal heart sounds.    No gallop.  Pulmonary:     Effort: Pulmonary effort is normal. No respiratory distress.     Breath sounds: Normal breath sounds. No wheezing or rales.  Abdominal:     General: Abdomen is protuberant. Bowel sounds are normal. There is no distension or abdominal bruit. There are no signs of injury.     Palpations: Abdomen is soft. There is no hepatomegaly, splenomegaly, mass or pulsatile mass.     Tenderness: There is no abdominal tenderness. Negative signs include Murphy's sign.     Hernia: A hernia is present. Hernia is present in the umbilical area.     Comments: Umbilical hernia is soft and easily reduced and non tender   Musculoskeletal:     Cervical back: Normal range of motion and neck supple.     Right lower leg: No edema.     Left lower leg: No edema.  Lymphadenopathy:     Cervical: No cervical adenopathy.  Skin:    General: Skin is warm and dry.     Coloration: Skin is not pale.     Findings: No rash.  Neurological:     Mental Status: She is alert.     Coordination: Coordination normal.     Deep Tendon Reflexes: Reflexes are normal and symmetric. Reflexes normal.  Psychiatric:        Mood and Affect: Mood normal.        Cognition and Memory: Cognition and memory normal.     Comments: Good historian          Assessment & Plan:   Problem List Items Addressed This Visit       Other   Diverticulosis  of colon without hemorrhage    Suspect recent episode of LLQ pain was from a diverticular source Symptoms are resolved  Will continue to watch diet and add fiber sup daily       Lower abdominal pain - Primary    This is a recurrent problem and latest episode is now totally resolved  ua clear but concentrated-stressed imp of more water intake  Suspect diverticular source inst pt to get a fiber supplement and use with water daily , also continue to avoid  nuts and seeds and popcorn  Adv diet slowly and update asap if this returns  Also if she develops any pain around the umbilical hernia or if her rectocele prevents nl passage of stool        Relevant Orders   POCT Urinalysis Dipstick (Automated) (Completed)   Umbilical hernia    No tenderness No change in size Entirely reducible  Disc s/s to watch for

## 2021-01-30 ENCOUNTER — Telehealth: Payer: Self-pay | Admitting: *Deleted

## 2021-01-30 NOTE — Chronic Care Management (AMB) (Signed)
Chronic Care Management   Outreach Note  01/30/2021 Name: Cheryl Joseph MRN: [id number] DOB: 18-Jun-1941  Cheryl Joseph is a 80 y.o. year old female who is a primary care patient of Tower, Audrie Gallus, MD. I reached out to [name] by phone today in response to a referral sent by Ms. Katherina Right [place]'s PCP Tower, Audrie Gallus, MD     An unsuccessful telephone outreach was attempted today. The patient was referred to the case management team for assistance with care management and care coordination.   Follow Up Plan: A HIPAA compliant phone message was left for the patient providing contact information and requesting a return call.  If patient returns call to provider office, please advise to call Embedded Care Management Care Guide Jaken Fregia at [phone]  Burman Nieves, CCMA Care Guide, Embedded Care Coordination [facility]  Care Management  Direct Dial: [phone]

## 2021-01-30 NOTE — Chronic Care Management (AMB) (Signed)
  Chronic Care Management   Note  01/30/2021 Name: Cheryl Joseph MRN: [id number] DOB: [birth year]  Cheryl Joseph is a 80 y.o. year old female who is a primary care patient of Tower, Wynelle Fanny, MD. I reached out to Russ Halo by phone today in response to a referral sent by [name]'s PCP Tower, Wynelle Fanny, MD     [name] was given information about Chronic Care Management services today including:  CCM service includes personalized support from designated clinical staff supervised by her physician, including individualized plan of care and coordination with other care providers 24/7 contact phone numbers for assistance for urgent and routine care needs. Service will only be billed when office clinical staff spend 20 minutes or more in a month to coordinate care. Only one practitioner may furnish and bill the service in a calendar month. The patient may stop CCM services at any time (effective at the end of the month) by phone call to the office staff. The patient will be responsible for cost sharing (co-pay) of up to 20% of the service fee (after annual deductible is met).  Patient agreed to services and verbal consent obtained.   Follow up plan: Telephone appointment with care management team member scheduled for:02/27/2021  Julian Hy, [place] Management  Direct Dial: [phone]

## 2021-02-27 ENCOUNTER — Ambulatory Visit (INDEPENDENT_AMBULATORY_CARE_PROVIDER_SITE_OTHER): Payer: Medicare HMO

## 2021-02-27 DIAGNOSIS — K429 Umbilical hernia without obstruction or gangrene: Secondary | ICD-10-CM

## 2021-02-27 DIAGNOSIS — E7849 Other hyperlipidemia: Secondary | ICD-10-CM

## 2021-02-27 DIAGNOSIS — I10 Essential (primary) hypertension: Secondary | ICD-10-CM | POA: Diagnosis not present

## 2021-02-27 DIAGNOSIS — K5730 Diverticulosis of large intestine without perforation or abscess without bleeding: Secondary | ICD-10-CM

## 2021-02-27 NOTE — Chronic Care Management (AMB) (Signed)
Chronic Care Management   CCM RN Visit Note  02/27/2021 Name: Cheryl Joseph MRN: [id number] DOB: 1941-01-26  Subjective: Cheryl Joseph is a 80 y.o. year old female who is a primary care patient of Tower, Wynelle Fanny, MD. The care management team was consulted for assistance with disease management and care coordination needs.    Engaged with patient by telephone for initial visit in response to provider referral for case management and/or care coordination services.   Consent to Services:  The patient was given the following information about Chronic Care Management services today, agreed to services, and gave verbal consent: 1. CCM service includes personalized support from designated clinical staff supervised by the primary care provider, including individualized plan of care and coordination with other care providers 2. 24/7 contact phone numbers for assistance for urgent and routine care needs. 3. Service will only be billed when office clinical staff spend 20 minutes or more in a month to coordinate care. 4. Only one practitioner may furnish and bill the service in a calendar month. 5.The patient may stop CCM services at any time (effective at the end of the month) by phone call to the office staff. 6. The patient will be responsible for cost sharing (co-pay) of up to 20% of the service fee (after annual deductible is met). Patient agreed to services and consent obtained.  Patient agreed to services and verbal consent obtained.   Assessment: Review of patient past medical history, allergies, medications, health status, including review of consultants reports, laboratory and other test data, was performed as part of comprehensive evaluation and provision of chronic care management services.   SDOH (Social Determinants of Health) assessments and interventions performed:  SDOH Interventions    Flowsheet Row Most Recent Value  SDOH Interventions   Food Insecurity Interventions Intervention  Not Indicated  Housing Interventions Intervention Not Indicated  Transportation Interventions Intervention Not Indicated        CCM Care Plan  Allergies  Allergen Reactions   Ezetimibe-Simvastatin     REACTION: leg pain   Tolterodine Tartrate     REACTION: side effects   Vesicare [Solifenacin Succinate]     Eye problems     Outpatient Encounter Medications as of 02/27/2021  Medication Sig Note   Acetaminophen (TYLENOL EXTRA STRENGTH PO) Take 1 capsule 2 (two) times daily by mouth.    amLODipine (NORVASC) 5 MG tablet Take 1 tablet (5 mg total) by mouth daily.    b complex vitamins tablet Take 1 tablet by mouth daily. Reported on 12/18/2015    Calcium Carbonate-Vitamin D (CALCIUM-VITAMIN D3 PO) Take 1 capsule by mouth daily. Reported on 12/18/2015    Cholecalciferol (VITAMIN D3 PO) Take 5,000 Units daily by mouth.    Hyaluronic Acid-Vitamin C (HYALURONIC ACID PO) Take 1 tablet by mouth daily. Reported on 12/18/2015 02/27/2021: Taking occasionally   IBUPROFEN PO Take 1 capsule 2 (two) times daily by mouth.    montelukast (SINGULAIR) 10 MG tablet Take 1 tablet (10 mg total) by mouth at bedtime.    Multiple Vitamin (MULTIVITAMIN) capsule Take 1 capsule by mouth daily. Reported on 12/18/2015    Omega-3 Fatty Acids (FISH OIL PO) Take 1 capsule by mouth daily. Reported on 12/18/2015    OVER THE COUNTER MEDICATION BONE UP: 2 Capsules once daily    oxybutynin (OXYTROL) 3.9 MG/24HR Place 1 patch onto the skin every 3 (three) days.    Probiotic Product (PROBIOTIC DAILY PO) Take 1 tablet by mouth daily.  simvastatin (ZOCOR) 40 MG tablet Take 1 tablet (40 mg total) by mouth at bedtime.    TURMERIC PO Take 1 capsule daily by mouth. 02/27/2021: Patient states she takes occasionally   Ubiquinol 100 MG CAPS Take 1 capsule by mouth 3 (three) times a week. Reported on 12/18/2015    albuterol (PROVENTIL HFA;VENTOLIN HFA) 108 (90 Base) MCG/ACT inhaler INHALE TWO PUFFS BY MOUTH EVERY 4 HOURS AS NEEDED FOR  WHEEZING AND 2 PUFFS BEFORE EXERCISE OR EXPOSURE TO COLD AIR (Patient not taking: Reported on 02/27/2021)    glucosamine-chondroitin 500-400 MG tablet Take 1 tablet by mouth daily. Reported on 12/18/2015 (Patient not taking: Reported on 02/27/2021)    No facility-administered encounter medications on file as of 02/27/2021.    Patient Active Problem List   Diagnosis Date Noted   Umbilical hernia 36/62/9476   Frequent stools 01/04/2020   Pre-operative general physical examination 07/06/2019   Lower abdominal pain 08/16/2018   Stress reaction 09/05/2017   Estrogen deficiency 05/25/2016   Routine general medical examination at a health care facility 11/23/2015   Hammertoe 10/28/2015   Frequent loose stools 02/14/2015   Diverticulosis of colon without hemorrhage 02/14/2015   Colon cancer screening 08/19/2014   Essential hypertension 02/25/2014   Encounter for Medicare annual wellness exam 07/16/2013   Prediabetes 05/14/2009   Rheumatoid arthritis with positive rheumatoid factor ([place]) 05/13/2008   Osteopenia 03/07/2007   Hyperlipidemia 01/03/2007   Obesity (BMI 30-39.9) 01/03/2007   ALLERGIC RHINITIS, SEASONAL 01/03/2007   ASTHMA 01/03/2007   OVERACTIVE BLADDER 01/03/2007    Conditions to be addressed/monitored:HTN and HLD  Care Plan : Cardiovascular  Updates made by Dannielle Karvonen, RN since 02/27/2021 12:00 AM     Problem: Lack of long term care plan for hypertension/ hyperlipidemia   Priority: Medium     Long-Range Goal: Hypertension / Hyperlipidemia Monitored and managed   Start Date: 02/27/2021  Expected End Date: 05/25/2021  This Visit's Progress: On track  Priority: Medium  Note:   Objective:  Last practice recorded BP readings:  BP Readings from Last 3 Encounters:  01/05/21 136/74  10/29/20 122/66  01/18/20 115/61      Component Value Date/Time   CHOL 172 10/08/2020 0902   TRIG 161.0 (H) 10/08/2020 0902   HDL 49.70 10/08/2020 0902   CHOLHDL 3 10/08/2020 0902   VLDL  32.2 10/08/2020 0902   LDLCALC 90 10/08/2020 0902   LDLDIRECT 97.0 07/06/2019 0953  Current Barriers:  Knowledge Deficits related to long term care plan for self management of hypertension / hyperlipidemia:  Patient with health history of HTN, HLD, Diverticulosis and umbilical hernia.  Patient states she checks her blood pressure on occasion.  She reports she is exercising riding her stationary bike and walking a few says per week. Patient states she has a cardiologist but has not seen him in approximately 2 years. She notices having heart flutters on occasion which are new.  RNCM advised patient to notify her doctor as soon as possible of new symptom.  Case Manager Clinical Goal(s):  patient will attend  scheduled medical appointments and have labs completed as recommended.  patient will demonstrate improved adherence to prescribed treatment plan for hypertension as evidenced by taking all medications as prescribed, monitoring and recording blood pressure as directed, adhering to low sodium/DASH diet Patient will take medications as prescribed and refill them timely Patient will notify provider of new or ongoing symptoms.  Interventions:  Collaboration with Tower, Wynelle Fanny, MD regarding development and update of  comprehensive plan of care as evidenced by provider attestation and co-signature Inter-disciplinary care team collaboration (see longitudinal plan of care) Evaluation of current treatment plan related to hypertension self management and patient's adherence to plan as established by provider. Provided education to patient re: stroke prevention, s/s of heart attack and stroke, DASH diet, complications of uncontrolled blood pressure Reviewed medications with patient and discussed importance of compliance Discussed plans with patient for ongoing care management follow up and provided patient with direct contact information for care management team Advised patient, providing education and  rationale, to monitor blood pressure daily and record, calling PCP for findings outside established parameters.  Reviewed scheduled/upcoming provider appointments Provided patient education article on Diverticulosis Patient Goals: - check blood pressure 2-3 times per month and record results - Take your medications as prescribed and refill timely - Follow up with your doctor as recommended - Review education article sent to you in Mychart on Diverticulosis. (Discuss with your RN case manager at the next telephone outreach all if you have questions) - Notify your doctor of new or ongoing symptoms as soon as possible ( Make a follow up appointment with your cardiologist to adjust symptoms of heart fluttering) - Review education information sent to you in MyChart on Managing hypertension / DASH diet - continue to incorporate exercise or some form of activity in your day.  - Plan to eat low salt and heart healthy meals full of fruits, vegetables, whole grains, lean protein and limit fat and sugars.  Follow Up Plan: The patient has been provided with contact information for the care management team and has been advised to call with any health related questions or concerns.  The care management team will reach out to the patient again over the next 2 months.            Plan:The patient has been provided with contact information for the care management team and has been advised to call with any health related questions or concerns.  and The care management team will reach out to the patient again over the next 2 months. Quinn Plowman RN,BSN,CCM RN Case Manager [place]  [phone]

## 2021-02-27 NOTE — Patient Instructions (Signed)
Visit Information:  thank you for taking the time to speak with me today.    PATIENT GOALS:   Goals Addressed             This Visit's Progress    Track and Manage My Blood Pressure-Hypertension   On track    Timeframe:  Long-Range Goal Priority:  Medium Start Date:  02/27/2021                           Expected End Date:    05/25/2021                   Follow Up Date 04/13/2021   - check blood pressure 2-3 times per month and record results - Take your medications as prescribed and refill timely - Follow up with your doctor as recommended - Review education article sent to you in Mychart on Diverticulosis. (Discuss with your RN case manager at the next telephone outreach all if you have questions) - Notify your doctor of new or ongoing symptoms as soon as possible ( Make a follow up appointment with your cardiologist to adjust symptoms of heart fluttering) - Review education information sent to you in MyChart on Managing hypertension / DASH diet - continue to incorporate exercise or some form of activity in your day.  - Plan to eat low salt and heart healthy meals full of fruits, vegetables, whole grains, lean protein and limit fat and sugars.   Why is this important?   You won't feel high blood pressure, but it can still hurt your blood vessels.  High blood pressure can cause heart or kidney problems. It can also cause a stroke.  Making lifestyle changes like losing a little weight or eating less salt will help.  Checking your blood pressure at home and at different times of the day can help to control blood pressure.  If the doctor prescribes medicine remember to take it the way the doctor ordered.  Call the office if you cannot afford the medicine or if there are questions about it.             Consent to CCM Services: Cheryl Joseph was given information about Chronic Care Management services including:  CCM service includes personalized support from designated clinical staff  supervised by her physician, including individualized plan of care and coordination with other care providers 24/7 contact phone numbers for assistance for urgent and routine care needs. Service will only be billed when office clinical staff spend 20 minutes or more in a month to coordinate care. Only one practitioner may furnish and bill the service in a calendar month. The patient may stop CCM services at any time (effective at the end of the month) by phone call to the office staff. The patient will be responsible for cost sharing (co-pay) of up to 20% of the service fee (after annual deductible is met).  Patient agreed to services and verbal consent obtained.   Patient verbalizes understanding of instructions provided today and agrees to view in [place].   The patient has been provided with contact information for the care management team and has been advised to call with any health related questions or concerns.  The care management team will reach out to the patient again over the next 2 months.   Quinn Plowman RN,BSN,CCM RN Case Manager [place]  [phone]   CLINICAL CARE PLAN: Patient Care Plan: Cardiovascular  Problem Identified: Lack of long term care plan for hypertension/ hyperlipidemia   Priority: Medium     Long-Range Goal: Hypertension / Hyperlipidemia Monitored and managed   Start Date: 02/27/2021  Expected End Date: 05/25/2021  This Visit's Progress: On track  Priority: Medium  Note:   Objective:  Last practice recorded BP readings:  BP Readings from Last 3 Encounters:  01/05/21 136/74  10/29/20 122/66  01/18/20 115/61      Component Value Date/Time   CHOL 172 10/08/2020 0902   TRIG 161.0 (H) 10/08/2020 0902   HDL 49.70 10/08/2020 0902   CHOLHDL 3 10/08/2020 0902   VLDL 32.2 10/08/2020 0902   LDLCALC 90 10/08/2020 0902   LDLDIRECT 97.0 07/06/2019 0953  Current Barriers:  Knowledge Deficits related to long term care plan for self  management of hypertension / hyperlipidemia:  Patient with health history of HTN, HLD, Diverticulosis and umbilical hernia.  Patient states she checks her blood pressure on occasion.  She reports she is exercising riding her stationary bike and walking a few says per week. Patient states she has a cardiologist but has not seen him in approximately 2 years. She notices having heart flutters on occasion which are new.  RNCM advised patient to notify her doctor as soon as possible of new symptom.  Case Manager Clinical Goal(s):  patient will attend  scheduled medical appointments and have labs completed as recommended.  patient will demonstrate improved adherence to prescribed treatment plan for hypertension as evidenced by taking all medications as prescribed, monitoring and recording blood pressure as directed, adhering to low sodium/DASH diet Patient will take medications as prescribed and refill them timely Patient will notify provider of new or ongoing symptoms.  Interventions:  Collaboration with Tower, Wynelle Fanny, MD regarding development and update of comprehensive plan of care as evidenced by provider attestation and co-signature Inter-disciplinary care team collaboration (see longitudinal plan of care) Evaluation of current treatment plan related to hypertension self management and patient's adherence to plan as established by provider. Provided education to patient re: stroke prevention, s/s of heart attack and stroke, DASH diet, complications of uncontrolled blood pressure Reviewed medications with patient and discussed importance of compliance Discussed plans with patient for ongoing care management follow up and provided patient with direct contact information for care management team Advised patient, providing education and rationale, to monitor blood pressure daily and record, calling PCP for findings outside established parameters.  Reviewed scheduled/upcoming provider appointments Provided  patient education article on Diverticulosis Patient Goals: - check blood pressure 2-3 times per month and record results - Take your medications as prescribed and refill timely - Follow up with your doctor as recommended - Review education article sent to you in Mychart on Diverticulosis. (Discuss with your RN case manager at the next telephone outreach all if you have questions) - Notify your doctor of new or ongoing symptoms as soon as possible ( Make a follow up appointment with your cardiologist to adjust symptoms of heart fluttering) - Review education information sent to you in MyChart on Managing hypertension / DASH diet - continue to incorporate exercise or some form of activity in your day.  - Plan to eat low salt and heart healthy meals full of fruits, vegetables, whole grains, lean protein and limit fat and sugars.  Follow Up Plan: The patient has been provided with contact information for the care management team and has been advised to call with any health related questions or concerns.  The care management team  will reach out to the patient again over the next 2 months.

## 2021-03-19 ENCOUNTER — Other Ambulatory Visit: Payer: Self-pay

## 2021-03-19 MED ORDER — ALBUTEROL SULFATE HFA 108 (90 BASE) MCG/ACT IN AERS: INHALATION_SPRAY | RESPIRATORY_TRACT | 2 refills | Status: DC

## 2021-03-19 NOTE — Telephone Encounter (Signed)
Pt has been using OTC Primatene Mist if needed for breathing; pt does not have to use often but pt was reading precautions on Primatene mist and possible side effects are CP or stroke. Pt request refill of albuterol inhaler to [facility] [address]; pt not having any problems with breathing but would like to have on hand if needed.last refilled albuterol in [place] on 01/23/2018.sending note to Dr Glori Bickers. Pt last seen on 01/05/21 for back pain.

## 2021-03-30 ENCOUNTER — Other Ambulatory Visit: Payer: Self-pay | Admitting: Family Medicine

## 2021-04-02 NOTE — Telephone Encounter (Signed)
  Encourage patient to contact the pharmacy for refills or they can request refills through [place]:  Please schedule appointment if longer than 1 year  NEXT APPOINTMENT DATE:  MEDICATION: simvastin   Is the patient out of medication? yes  PH[facility]ACY[facility] [address]   Let patient know to contact pharmacy at the end of the day to make sure medication is ready.  Please notify patient to allow 48-72 hours to process  CLINICAL FILLS OUT ALL BELOW:   LAST REFILL:  QTY:  REFILL DATE:    OTHER COMMENTS:    Okay for refill?  Please advise

## 2021-04-03 ENCOUNTER — Other Ambulatory Visit: Payer: Self-pay | Admitting: Family Medicine

## 2021-04-07 NOTE — Telephone Encounter (Signed)
Per our chart refill was sent in on 10/29/20 for 1 year. I spoke with [facility] pharmacist and was told they did not receive that refill. I have re sent refills and patient was advised on her voicemail.

## 2021-04-07 NOTE — Telephone Encounter (Signed)
Pt called in to check on status. States she is completely out of medication. Needs to be able to pick up today [place] on [address].

## 2021-04-13 ENCOUNTER — Ambulatory Visit (INDEPENDENT_AMBULATORY_CARE_PROVIDER_SITE_OTHER): Payer: Medicare HMO

## 2021-04-13 DIAGNOSIS — E7849 Other hyperlipidemia: Secondary | ICD-10-CM

## 2021-04-13 DIAGNOSIS — I10 Essential (primary) hypertension: Secondary | ICD-10-CM | POA: Diagnosis not present

## 2021-04-13 DIAGNOSIS — K5730 Diverticulosis of large intestine without perforation or abscess without bleeding: Secondary | ICD-10-CM

## 2021-04-13 DIAGNOSIS — K429 Umbilical hernia without obstruction or gangrene: Secondary | ICD-10-CM

## 2021-04-13 NOTE — Patient Instructions (Signed)
Visit Information:  Thank you for taking the time to speak with me today.   PATIENT GOALS:  Goals Addressed             This Visit's Progress    Track and Manage My Blood Pressure-Hypertension   On track    Timeframe:  Long-Range Goal Priority:  Medium Start Date:  02/27/2021                           Expected End Date:    06/24/2021                  Follow Up Date 05/18/2021  - Continue to check blood pressure 2-3 times per month and record results -  Continue to take your medications as prescribed and refill timely - Follow up with your doctor as recommended - Notify your doctor of new or ongoing symptoms as soon as possible  - continue to incorporate exercise or some form of activity in your day.  - Plan to eat low salt and heart healthy meals full of fruits, vegetables, whole grains, lean protein and limit fat and sugars.  - Plan to get your immunizations I.e. Flu shot, Shingles vaccine, COVID booster.  Why is this important?   You won't feel high blood pressure, but it can still hurt your blood vessels.  High blood pressure can cause heart or kidney problems. It can also cause a stroke.  Making lifestyle changes like losing a little weight or eating less salt will help.  Checking your blood pressure at home and at different times of the day can help to control blood pressure.  If the doctor prescribes medicine remember to take it the way the doctor ordered.  Call the office if you cannot afford the medicine or if there are questions about it.             Patient verbalizes understanding of instructions provided today and agrees to view in [place].   The patient has been provided with contact information for the care management team and has been advised to call with any health related questions or concerns.  The care management team will reach out to the patient again over the next 2 months.   Quinn Plowman RN,BSN,CCM RN Case Manager [place]   [phone]

## 2021-04-13 NOTE — Chronic Care Management (AMB) (Signed)
Chronic Care Management   CCM RN Visit Note  04/13/2021 Name: Cheryl Joseph MRN: [id number] DOB: August 27, 1940  Subjective: Cheryl Joseph is a 80 y.o. year old female who is a primary care patient of Tower, Wynelle Fanny, MD. The care management team was consulted for assistance with disease management and care coordination needs.    Engaged with patient by telephone for follow up visit in response to provider referral for case management and/or care coordination services.   Consent to Services:  The patient was given information about Chronic Care Management services, agreed to services, and gave verbal consent prior to initiation of services.  Please see initial visit note for detailed documentation.   Patient agreed to services and verbal consent obtained.   Assessment: Review of patient past medical history, allergies, medications, health status, including review of consultants reports, laboratory and other test data, was performed as part of comprehensive evaluation and provision of chronic care management services.   SDOH (Social Determinants of Health) assessments and interventions performed:    CCM Care Plan  Allergies  Allergen Reactions   Ezetimibe-Simvastatin     REACTION: leg pain   Tolterodine Tartrate     REACTION: side effects   Vesicare [Solifenacin Succinate]     Eye problems     Outpatient Encounter Medications as of 04/13/2021  Medication Sig Note   Acetaminophen (TYLENOL EXTRA STRENGTH PO) Take 1 capsule 2 (two) times daily by mouth.    albuterol (VENTOLIN HFA) 108 (90 Base) MCG/ACT inhaler INHALE TWO PUFFS BY MOUTH EVERY 4 HOURS AS NEEDED FOR WHEEZING AND 2 PUFFS BEFORE EXERCISE OR EXPOSURE TO COLD AIR    amLODipine (NORVASC) 5 MG tablet Take 1 tablet (5 mg total) by mouth daily.    b complex vitamins tablet Take 1 tablet by mouth daily. Reported on 12/18/2015    Calcium Carbonate-Vitamin D (CALCIUM-VITAMIN D3 PO) Take 1 capsule by mouth daily. Reported on  12/18/2015    Cholecalciferol (VITAMIN D3 PO) Take 5,000 Units daily by mouth.    glucosamine-chondroitin 500-400 MG tablet Take 1 tablet by mouth daily. Reported on 12/18/2015 (Patient not taking: Reported on 02/27/2021)    Hyaluronic Acid-Vitamin C (HYALURONIC ACID PO) Take 1 tablet by mouth daily. Reported on 12/18/2015 02/27/2021: Taking occasionally   IBUPROFEN PO Take 1 capsule 2 (two) times daily by mouth.    montelukast (SINGULAIR) 10 MG tablet Take 1 tablet (10 mg total) by mouth at bedtime.    Multiple Vitamin (MULTIVITAMIN) capsule Take 1 capsule by mouth daily. Reported on 12/18/2015    Omega-3 Fatty Acids (FISH OIL PO) Take 1 capsule by mouth daily. Reported on 12/18/2015    OVER THE COUNTER MEDICATION BONE UP: 2 Capsules once daily    oxybutynin (OXYTROL) 3.9 MG/24HR Place 1 patch onto the skin every 3 (three) days.    Probiotic Product (PROBIOTIC DAILY PO) Take 1 tablet by mouth daily.    simvastatin (ZOCOR) 40 MG tablet TAKE 1 TABLET BY MOUTH AT BEDTIME    TURMERIC PO Take 1 capsule daily by mouth. 02/27/2021: Patient states she takes occasionally   Ubiquinol 100 MG CAPS Take 1 capsule by mouth 3 (three) times a week. Reported on 12/18/2015    No facility-administered encounter medications on file as of 04/13/2021.    Patient Active Problem List   Diagnosis Date Noted   Umbilical hernia [id number]   Frequent stools 01/04/2020   Pre-operative general physical examination 07/06/2019   Lower abdominal pain 08/16/2018   Stress  reaction 09/05/2017   Estrogen deficiency 05/25/2016   Routine general medical examination at a health care facility 11/23/2015   Hamm[place]oe 10/28/2015   Frequent loose stools 02/14/2015   Diverticulosis of colon without hemorrhage 02/14/2015   Colon cancer screening 08/19/2014   Essential hypertension 02/25/2014   Encounter for Medicare annual wellness exam 07/16/2013   Prediabetes 05/14/2009   Rheumatoid arthritis with positive rheumatoid factor ([place])  05/13/2008   Osteopenia 03/07/2007   Hyperlipidemia 01/03/2007   Obesity (BMI 30-39.9) 01/03/2007   ALLERGIC RHINITIS, SEASONAL 01/03/2007   ASTHMA 01/03/2007   OVERACTIVE BLADDER 01/03/2007    Conditions to be addressed/monitored:HTN and HLD  Care Plan : Cardiovascular  Updates made by Dannielle Karvonen, RN since 04/13/2021 12:00 AM     Problem: Lack of long term care plan for hypertension/ hyperlipidemia   Priority: Medium     Long-Range Goal: Hypertension / Hyperlipidemia Monitored and managed   Start Date: 02/27/2021  Expected End Date: 06/24/2021  This Visit's Progress: On track  Recent Progress: On track  Priority: Medium  Note:   Objective:  Last practice recorded BP readings:  BP Readings from Last 3 Encounters:  01/05/21 136/74  10/29/20 122/66  01/18/20 115/61      Component Value Date/Time   CHOL 172 10/08/2020 0902   TRIG 161.0 (H) 10/08/2020 0902   HDL 49.70 10/08/2020 0902   CHOLHDL 3 10/08/2020 0902   VLDL 32.2 10/08/2020 0902   LDLCALC 90 10/08/2020 0902   LDLDIRECT 97.0 07/06/2019 0953  Current Barriers:  Knowledge Deficits related to long term care plan for self management of hypertension / hyperlipidemia:  Patient reports checking her blood pressures since last outreach with RNCM. Reported blood pressures:  168/82, 130/63, 128/64, 131/75, 134/66.  Patient notes receiving medical news regarding her husbands health on the day her blood pressure was 168/82.  She reports she did not schedule a cardiology follow up visit because she is not having any symptoms of her heart fluttering. Patient states she is taking miralax now to help with managing her bowel concerns.  She reports having some discomfort  from the umbilical hernia.  RNCM advised patient to inform her doctor of any increase in or ongoing symptoms. Patient verbalized understanding and agreement.   Immunizations discussed and advised.      Case Manager Clinical Goal(s):  patient will attend  scheduled  medical appointments and have labs completed as recommended.  patient will demonstrate improved adherence to prescribed treatment plan for hypertension as evidenced by taking all medications as prescribed, monitoring and recording blood pressure as directed, adhering to low sodium/DASH diet Patient will take medications as prescribed and refill them timely Patient will notify provider of new or ongoing symptoms.  Interventions:  Collaboration with Tower, Wynelle Fanny, MD regarding development and update of comprehensive plan of care as evidenced by provider attestation and co-signature Inter-disciplinary care team collaboration (see longitudinal plan of care) Evaluation of current treatment plan related to hypertension self management and patient's adherence to plan as established by provider. Provided education to patient re: stroke prevention, s/s of heart attack and stroke, DASH diet, complications of uncontrolled blood pressure Reviewed medications with patient and discussed importance of compliance Discussed plans with patient for ongoing care management follow up and provided patient with direct contact information for care management team Reviewed scheduled/upcoming provider appointments Discussed signs/ symptoms Diverticulosis Patient Goals: - Continue to check blood pressure 2-3 times per month and record results -  Continue to take your medications as  prescribed and refill timely - Follow up with your doctor as recommended - Notify your doctor of new or ongoing symptoms as soon as possible  - continue to incorporate exercise or some form of activity in your day.  - Plan to eat low salt and heart healthy meals full of fruits, vegetables, whole grains, lean protein and limit fat and sugars.  - Plan to get your immunizations I.e. Flu shot, Shingles vaccine, COVID booster.  Follow Up Plan: The patient has been provided with contact information for the care management team and has been advised  to call with any health related questions or concerns.  The care management team will reach out to the patient again over the next 2 months.      Plan:The patient has been provided with contact information for the care management team and has been advised to call with any health related questions or concerns.  and The care management team will reach out to the patient again over the next 2 months. Quinn Plowman RN,BSN,CCM RN Case Manager [place]  [phone]

## 2021-05-18 ENCOUNTER — Ambulatory Visit (INDEPENDENT_AMBULATORY_CARE_PROVIDER_SITE_OTHER): Payer: Medicare HMO

## 2021-05-18 DIAGNOSIS — K429 Umbilical hernia without obstruction or gangrene: Secondary | ICD-10-CM

## 2021-05-18 DIAGNOSIS — E7849 Other hyperlipidemia: Secondary | ICD-10-CM

## 2021-05-18 DIAGNOSIS — I10 Essential (primary) hypertension: Secondary | ICD-10-CM | POA: Diagnosis not present

## 2021-05-18 DIAGNOSIS — K5730 Diverticulosis of large intestine without perforation or abscess without bleeding: Secondary | ICD-10-CM

## 2021-05-18 NOTE — Chronic Care Management (AMB) (Signed)
Chronic Care Management   CCM RN Visit Note  05/18/2021 Name: Cheryl Joseph MRN: [id number] DOB: [birth year]  Subjective: Cheryl Joseph is a 80 y.o. year old female who is a primary care patient of Tower, Wynelle Fanny, MD. The care management team was consulted for assistance with disease management and care coordination needs.    Engaged with patient by telephone for follow up visit in response to provider referral for case management and/or care coordination services.   Consent to Services:  The patient was given information about Chronic Care Management services, agreed to services, and gave verbal consent prior to initiation of services.  Please see initial visit note for detailed documentation.   Patient agreed to services and verbal consent obtained.   Assessment: Review of patient past medical history, allergies, medications, health status, including review of consultants reports, laboratory and other test data, was performed as part of comprehensive evaluation and provision of chronic care management services.   SDOH (Social Determinants of Health) assessments and interventions performed:    CCM Care Plan  Allergies  Allergen Reactions   Ezetimibe-Simvastatin     REACTION: leg pain   Tolterodine Tartrate     REACTION: side effects   Vesicare [Solifenacin Succinate]     Eye problems     Outpatient Encounter Medications as of 05/18/2021  Medication Sig Note   Acetaminophen (TYLENOL EXTRA STRENGTH PO) Take 1 capsule 2 (two) times daily by mouth.    albuterol (VENTOLIN HFA) 108 (90 Base) MCG/ACT inhaler INHALE TWO PUFFS BY MOUTH EVERY 4 HOURS AS NEEDED FOR WHEEZING AND 2 PUFFS BEFORE EXERCISE OR EXPOSURE TO COLD AIR    amLODipine (NORVASC) 5 MG tablet Take 1 tablet (5 mg total) by mouth daily.    b complex vitamins tablet Take 1 tablet by mouth daily. Reported on 12/18/2015    Calcium Carbonate-Vitamin D (CALCIUM-VITAMIN D3 PO) Take 1 capsule by mouth daily. Reported on  12/18/2015    Cholecalciferol (VITAMIN D3 PO) Take 5,000 Units daily by mouth.    glucosamine-chondroitin 500-400 MG tablet Take 1 tablet by mouth daily. Reported on 12/18/2015 (Patient not taking: Reported on 02/27/2021)    Hyaluronic Acid-Vitamin C (HYALURONIC ACID PO) Take 1 tablet by mouth daily. Reported on 12/18/2015 02/27/2021: Taking occasionally   IBUPROFEN PO Take 1 capsule 2 (two) times daily by mouth.    montelukast (SINGULAIR) 10 MG tablet Take 1 tablet (10 mg total) by mouth at bedtime.    Multiple Vitamin (MULTIVITAMIN) capsule Take 1 capsule by mouth daily. Reported on 12/18/2015    Omega-3 Fatty Acids (FISH OIL PO) Take 1 capsule by mouth daily. Reported on 12/18/2015    OVER THE COUNTER MEDICATION BONE UP: 2 Capsules once daily    oxybutynin (OXYTROL) 3.9 MG/24HR Place 1 patch onto the skin every 3 (three) days.    Probiotic Product (PROBIOTIC DAILY PO) Take 1 tablet by mouth daily.    simvastatin (ZOCOR) 40 MG tablet TAKE 1 TABLET BY MOUTH AT BEDTIME    TURMERIC PO Take 1 capsule daily by mouth. 02/27/2021: Patient states she takes occasionally   Ubiquinol 100 MG CAPS Take 1 capsule by mouth 3 (three) times a week. Reported on 12/18/2015    No facility-administered encounter medications on file as of 05/18/2021.    Patient Active Problem List   Diagnosis Date Noted   Umbilical hernia 67/89/3810   Frequent stools 01/04/2020   Pre-operative general physical examination 07/06/2019   Lower abdominal pain 08/16/2018   Stress  reaction 09/05/2017   Estrogen deficiency 05/25/2016   Routine general medical examination at a health care facility 11/23/2015   [place] 10/28/2015   Frequent loose stools 02/14/2015   Diverticulosis of colon without hemorrhage 02/14/2015   Colon cancer screening 08/19/2014   Essential hypertension 02/25/2014   Encounter for Medicare annual wellness exam 07/16/2013   Prediabetes 05/14/2009   Rheumatoid arthritis with positive rheumatoid factor ([place])  05/13/2008   Osteopenia 03/07/2007   Hyperlipidemia 01/03/2007   Obesity (BMI 30-39.9) 01/03/2007   ALLERGIC RHINITIS, SEASONAL 01/03/2007   ASTHMA 01/03/2007   OVERACTIVE BLADDER 01/03/2007    Conditions to be addressed/monitored:HTN and HLD  Care Plan : Cardiovascular  Updates made by Dannielle Karvonen, RN since 05/18/2021 12:00 AM     Problem: Lack of long term care plan for hypertension/ hyperlipidemia   Priority: High     Long-Range Goal: Hypertension / Hyperlipidemia Monitored and managed   Start Date: 02/27/2021  Expected End Date: 09/22/2021  This Visit's Progress: On track  Recent Progress: On track  Priority: High  Note:   Objective:  Last practice recorded BP readings:  BP Readings from Last 3 Encounters:  01/05/21 136/74  10/29/20 122/66  01/18/20 115/61      Component Value Date/Time   CHOL 172 10/08/2020 0902   TRIG 161.0 (H) 10/08/2020 0902   HDL 49.70 10/08/2020 0902   CHOLHDL 3 10/08/2020 0902   VLDL 32.2 10/08/2020 0902   LDLCALC 90 10/08/2020 0902   LDLDIRECT 97.0 07/06/2019 0953  Current Barriers:  Knowledge Deficits related to long term care plan for self management of hypertension / hyperlipidemia:  Patient states she is doing well.  She continues to monitor her blood pressure regularly.   Reported blood pressures: 151/90, 134/62, 145/60, 148/74.144/63. 130/62.   Patient denies having any hernia pain or diverticulitis symptoms.  She reports receiving her flu shot and considering shingles shot soon.         Case Manager Clinical Goal(s):  patient will attend  scheduled medical appointments and have labs completed as recommended.  patient will demonstrate improved adherence to prescribed treatment plan for hypertension as evidenced by taking all medications as prescribed, monitoring and recording blood pressure as directed, adhering to low sodium/DASH diet Patient will take medications as prescribed and refill them timely Patient will notify provider of  new or ongoing symptoms.  Interventions:  Collaboration with Tower, Wynelle Fanny, MD regarding development and update of comprehensive plan of care as evidenced by provider attestation and co-signature Inter-disciplinary care team collaboration (see longitudinal plan of care) Evaluation of current treatment plan related to hypertension self management and patient's adherence to plan as established by provider. Discussed with patient importance of notifying provider of consistent blood pressure readings.  Advised patient to send message to provider through MyChart for consistent blood pressures of 150/90 and above. Patient also advised to take recorded blood pressure readings to provider appointment.   Provided education to patient re: stroke prevention, s/s of heart attack and stroke, DASH diet, complications of uncontrolled blood pressure Reviewed medications with patient and discussed importance of compliance Discussed plans with patient for ongoing care management follow up and provided patient with direct contact information for care management team Reviewed scheduled/upcoming provider appointments Discussed signs/ symptoms Diverticulosis Patient Goals: - Continue to check blood pressure 2-3 times per month and record results ( take blood pressure readings to provider appointment.  Send provider message through MyChart for consistent blood pressures of 150/90 and above -  Continue  to take your medications as prescribed and refill timely - Follow up with your doctor as recommended - Notify your doctor of new or ongoing symptoms as soon as possible  - continue to incorporate exercise or some form of activity in your day.  - Plan to eat low salt and heart healthy meals full of fruits, vegetables, whole grains, lean protein and limit fat and sugars.   Follow Up Plan: The patient has been provided with contact information for the care management team and has been advised to call with any health related  questions or concerns.  The care management team will reach out to the patient again over the next 2 months.       Plan:The patient has been provided with contact information for the care management team and has been advised to call with any health related questions or concerns.  The care management team will reach out to the patient again over the next 2 months. Quinn Plowman RN,BSN,CCM RN Case Manager [place]  [phone]

## 2021-05-18 NOTE — Patient Instructions (Signed)
Visit Information:  Thank you for taking the time to speak with me today.   PATIENT GOALS:  Goals Addressed             This Visit's Progress    Track and Manage My Blood Pressure-Hypertension   On track    Timeframe:  Long-Range Goal Priority:  High Start Date:  02/27/2021                           Expected End Date:   09/22/2020              Follow Up Date 07/09/2021  - Continue to check blood pressure 2-3 times per month and record results ( take blood pressure readings to provider appointment.  Send provider message through [place] for consistent blood pressures of 150/90 and above -  Continue to take your medications as prescribed and refill timely - Follow up with your doctor as recommended - Notify your doctor of new or ongoing symptoms as soon as possible  - continue to incorporate exercise or some form of activity in your day.  - Plan to eat low salt and heart healthy meals full of fruits, vegetables, whole grains, lean protein and limit fat and sugars.    Why is this important?   You won't feel high blood pressure, but it can still hurt your blood vessels.  High blood pressure can cause heart or kidney problems. It can also cause a stroke.  Making lifestyle changes like losing a little weight or eating less salt will help.  Checking your blood pressure at home and at different times of the day can help to control blood pressure.  If the doctor prescribes medicine remember to take it the way the doctor ordered.  Call the office if you cannot afford the medicine or if there are questions about it.             Patient verbalizes understanding of instructions provided today and agrees to view in [place].   The patient has been provided with contact information for the care management team and has been advised to call with any health related questions or concerns.  The care management team will reach out to the patient again over the next 2 months.   Quinn Plowman  RN,BSN,CCM RN Case Manager [place]  [phone]

## 2021-05-23 ENCOUNTER — Emergency Department
Admission: EM | Admit: 2021-05-23 | Discharge: 2021-05-23 | Disposition: A | Discharge status: Home / Self Care | Payer: Medicare HMO | Attending: Emergency Medicine | Admitting: Emergency Medicine

## 2021-05-23 ENCOUNTER — Emergency Department: Payer: Medicare HMO

## 2021-05-23 ENCOUNTER — Other Ambulatory Visit: Payer: Self-pay

## 2021-05-23 ENCOUNTER — Encounter: Payer: Self-pay | Admitting: Emergency Medicine

## 2021-05-23 DIAGNOSIS — K439 Ventral hernia without obstruction or gangrene: Secondary | ICD-10-CM

## 2021-05-23 DIAGNOSIS — Z85828 Personal history of other malignant neoplasm of skin: Secondary | ICD-10-CM | POA: Diagnosis not present

## 2021-05-23 DIAGNOSIS — Z96651 Presence of right artificial knee joint: Secondary | ICD-10-CM | POA: Insufficient documentation

## 2021-05-23 DIAGNOSIS — E119 Type 2 diabetes mellitus without complications: Secondary | ICD-10-CM | POA: Insufficient documentation

## 2021-05-23 DIAGNOSIS — K5730 Diverticulosis of large intestine without perforation or abscess without bleeding: Secondary | ICD-10-CM | POA: Diagnosis not present

## 2021-05-23 DIAGNOSIS — J45909 Unspecified asthma, uncomplicated: Secondary | ICD-10-CM | POA: Diagnosis not present

## 2021-05-23 DIAGNOSIS — I10 Essential (primary) hypertension: Secondary | ICD-10-CM | POA: Insufficient documentation

## 2021-05-23 DIAGNOSIS — K429 Umbilical hernia without obstruction or gangrene: Secondary | ICD-10-CM | POA: Diagnosis not present

## 2021-05-23 DIAGNOSIS — K8020 Calculus of gallbladder without cholecystitis without obstruction: Secondary | ICD-10-CM | POA: Diagnosis not present

## 2021-05-23 DIAGNOSIS — R1032 Left lower quadrant pain: Secondary | ICD-10-CM | POA: Diagnosis present

## 2021-05-23 LAB — URINALYSIS, ROUTINE W REFLEX MICROSCOPIC
Bacteria, UA: NONE SEEN
Bilirubin Urine: NEGATIVE
Glucose, UA: NEGATIVE mg/dL
Ketones, ur: NEGATIVE mg/dL
Leukocytes,Ua: NEGATIVE
Nitrite: NEGATIVE
Protein, ur: NEGATIVE mg/dL
Specific Gravity, Urine: 1.017 (ref 1.005–1.030)
pH: 5 (ref 5.0–8.0)

## 2021-05-23 LAB — COMPREHENSIVE METABOLIC PANEL
ALT: 15 U/L (ref 0–44)
AST: 17 U/L (ref 15–41)
Albumin: 3.8 g/dL (ref 3.5–5.0)
Alkaline Phosphatase: 77 U/L (ref 38–126)
Anion gap: 7 (ref 5–15)
BUN: 16 mg/dL (ref 8–23)
CO2: 26 mmol/L (ref 22–32)
Calcium: 8.7 mg/dL — ABNORMAL LOW (ref 8.9–10.3)
Chloride: 106 mmol/L (ref 98–111)
Creatinine, Ser: 0.77 mg/dL (ref 0.44–1.00)
GFR, Estimated: >60 mL/min (ref >60)
Glucose, Bld: 123 mg/dL — ABNORMAL HIGH (ref 70–99)
Potassium: 3.9 mmol/L (ref 3.5–5.1)
Sodium: 139 mmol/L (ref 135–145)
Total Bilirubin: 0.8 mg/dL (ref 0.3–1.2)
Total Protein: 6.9 g/dL (ref 6.5–8.1)

## 2021-05-23 LAB — CBC
HCT: 43.8 % (ref 36.0–46.0)
Hemoglobin: 15 g/dL (ref 12.0–15.0)
MCH: 32.8 pg (ref 26.0–34.0)
MCHC: 34.2 g/dL (ref 30.0–36.0)
MCV: 95.6 fL (ref 80.0–100.0)
Platelets: 250 10*3/uL (ref 150–400)
RBC: 4.58 MIL/uL (ref 3.87–5.11)
RDW: 13.3 % (ref 11.5–15.5)
WBC: 7.3 10*3/uL (ref 4.0–10.5)
nRBC: 0 % (ref 0.0–0.2)

## 2021-05-23 LAB — LIPASE, BLOOD: Lipase: 30 U/L (ref 11–51)

## 2021-05-23 MED ORDER — IOHEXOL 300 MG/ML  SOLN
100.0000 mL | Freq: Once | INTRAMUSCULAR | Status: AC | PRN
  Administered 2021-05-23: 100 mL via INTRAVENOUS
  Filled 2021-05-23: qty 100

## 2021-05-23 MED ORDER — SODIUM CHLORIDE 0.9 % IV BOLUS
500.0000 mL | Freq: Once | INTRAVENOUS | Status: AC
  Administered 2021-05-23: 500 mL via INTRAVENOUS

## 2021-05-23 NOTE — ED Provider Notes (Signed)
[facility] Emergency Department Provider Note  ____________________________________________  Time seen: Approximately 4:06 PM  I have reviewed the triage vital signs and the nursing notes.   HISTORY  Chief Complaint Abdominal Pain    HPI Cheryl Joseph is a 80 y.o. female who presents emergency department complaining of sharp abdominal pain.  Patient has a known periumbilical hernia, states that it is been increasing in pain.  Patient states that she tried calling her primary care doctor, however given the fact that it was the weekend, she cannot reach them.  She states that she had been told that if she had increasing pain over the hernia she should present for evaluation to ensure there was no incarceration.  Patient states that she is also having some extension into the left lower quadrant radiating into her back.  She does have a history of diverticulitis and states that a lot of times her symptoms will presents similarly.  Given the fact however that she is most tender over her hernia she is most concerned for hernia at this time.  No emesis, diarrhea or constipation.  Patient had informed triage that she felt like she was urinating less, however she states that she is still urinating without difficulty, no dysuria, polyuria or hematuria.  History of angina, rheumatoid arthritis, asthma, diabetes, hypertension.       Past Medical History:  Diagnosis Date   Anginal pain ([place])    Arthritis    RA hands(seronegative)   Asthma    Back pain    Cancer ([place])    skin   Diabetes mellitus without complication ([place])    HPV in female 1996   HPV with colposcopy (all neg paps since)   Hyperlipidemia    Hypertension    Obesity    Osteopenia    Shortness of breath dyspnea    Shoulder pain     Patient Active Problem List   Diagnosis Date Noted   Umbilical hernia 76/54/6503   Frequent stools 01/04/2020   Pre-operative general physical examination 07/06/2019    Lower abdominal pain 08/16/2018   Stress reaction 09/05/2017   Estrogen deficiency 05/25/2016   Routine general medical examination at a health care facility 11/23/2015   [place] 10/28/2015   Frequent loose stools 02/14/2015   Diverticulosis of colon without hemorrhage 02/14/2015   Colon cancer screening 08/19/2014   Essential hypertension 02/25/2014   Encounter for Medicare annual wellness exam 07/16/2013   Prediabetes 05/14/2009   Rheumatoid arthritis with positive rheumatoid factor ([place]) 05/13/2008   Osteopenia 03/07/2007   Hyperlipidemia 01/03/2007   Obesity (BMI 30-39.9) 01/03/2007   ALLERGIC RHINITIS, SEASONAL 01/03/2007   ASTHMA 01/03/2007   OVERACTIVE BLADDER 01/03/2007    Past Surgical History:  Procedure Laterality Date   COLONOSCOPY WITH PROPOFOL N/A 05/01/2015   Procedure: COLONOSCOPY WITH PROPOFOL;  Surgeon: Manya Silvas, MD;  Location: [place];  Service: Endoscopy;  Laterality: N/A;   EYE SURGERY     JOINT REPLACEMENT Right    right knee replacement January 2021   pelvic organ prolapse surgery   2015   SQUAMOUS CELL CARCINOMA EXCISION Left 12/2017   left arm   TONSILLECTOMY     VAGINAL HYSTERECTOMY  2015   with pelvic organ prolapse surgery     Prior to Admission medications   Medication Sig Start Date End Date Taking? Authorizing Provider  Acetaminophen (TYLENOL EXTRA STRENGTH PO) Take 1 capsule 2 (two) times daily by mouth.    [provider]  albuterol (  VENTOLIN HFA) 108 (90 Base) MCG/ACT inhaler INHALE TWO PUFFS BY MOUTH EVERY 4 HOURS AS NEEDED FOR WHEEZING AND 2 PUFFS BEFORE EXERCISE OR EXPOSURE TO COLD AIR 03/19/21   Tower, Marne A, MD  amLODipine (NORVASC) 5 MG tablet Take 1 tablet (5 mg total) by mouth daily. 10/29/20   Tower, Wynelle Fanny, MD  b complex vitamins tablet Take 1 tablet by mouth daily. Reported on 12/18/2015    [provider]  Calcium Carbonate-Vitamin D (CALCIUM-VITAMIN D3 PO) Take 1 capsule by mouth daily.  Reported on 12/18/2015    [provider]  Cholecalciferol (VITAMIN D3 PO) Take 5,000 Units daily by mouth.    [provider]  glucosamine-chondroitin 500-400 MG tablet Take 1 tablet by mouth daily. Reported on 12/18/2015 Patient not taking: Reported on 02/27/2021    [provider]  Hyaluronic Acid-Vitamin C (HYALURONIC ACID PO) Take 1 tablet by mouth daily. Reported on 12/18/2015    [provider]  IBUPROFEN PO Take 1 capsule 2 (two) times daily by mouth.    [provider]  montelukast (SINGULAIR) 10 MG tablet Take 1 tablet (10 mg total) by mouth at bedtime. 10/29/20   Tower, Wynelle Fanny, MD  Multiple Vitamin (MULTIVITAMIN) capsule Take 1 capsule by mouth daily. Reported on 12/18/2015    [provider]  Omega-3 Fatty Acids (FISH OIL PO) Take 1 capsule by mouth daily. Reported on 12/18/2015    [provider]  OVER THE COUNTER MEDICATION BONE UP: 2 Capsules once daily    [provider]  oxybutynin (OXYTROL) 3.9 MG/24HR Place 1 patch onto the skin every 3 (three) days. 10/29/20   Tower, Wynelle Fanny, MD  Probiotic Product (PROBIOTIC DAILY PO) Take 1 tablet by mouth daily.    [provider]  simvastatin (ZOCOR) 40 MG tablet TAKE 1 TABLET BY MOUTH AT BEDTIME 04/07/21   Tower, Wynelle Fanny, MD  TURMERIC PO Take 1 capsule daily by mouth.    [provider]  Ubiquinol 100 MG CAPS Take 1 capsule by mouth 3 (three) times a week. Reported on 12/18/2015    [provider]    Allergies Ezetimibe-simvastatin, Tolterodine tartrate, and Vesicare [solifenacin succinate]  Family History  Problem Relation Age of Onset   Diabetes Mother    Hyperlipidemia Mother    Heart disease Mother    Hypertension Mother    Obesity Mother    Kyphosis Mother    Heart disease Father    Hypertension Father    Cancer Father        lung Cancer smoker   Diabetes Daughter    Kyphosis Sister    Breast cancer Neg Hx     Social History Social  History   Tobacco Use   Smoking status: Never   Smokeless tobacco: Never  Vaping Use   Vaping Use: Never used  Substance Use Topics   Alcohol use: No    Alcohol/week: 0.0 standard drinks   Drug use: No     Review of Systems  Constitutional: No fever/chills Eyes: No visual changes. No discharge ENT: No upper respiratory complaints. Cardiovascular: no chest pain. Respiratory: no cough. No SOB. Gastrointestinal: Abdominal wall pain around known hernia, left lower quadrant pain.  No nausea, no vomiting.  No diarrhea.  No constipation. Genitourinary: Negative for dysuria. No hematuria.  Feels like she is peeing less than normal Musculoskeletal: Negative for musculoskeletal pain. Skin: Negative for rash, abrasions, lacerations, ecchymosis. Neurological: Negative for headaches, focal weakness or numbness.  10  System ROS otherwise negative.  ____________________________________________   PHYSICAL EXAM:  VITAL SIGNS: ED Triage Vitals  Enc Vitals Group     BP 05/23/21 1008 137/71     Pulse Rate 05/23/21 1008 92     Resp 05/23/21 1008 20     Temp 05/23/21 1008 98.3 F (36.8 C)     Temp Source 05/23/21 1008 Oral     SpO2 05/23/21 1008 94 %     Weight 05/23/21 1008 195 lb (88.5 kg)     Height 05/23/21 1008 5\' 3"  (1.6 m)     Head Circumference --      Peak Flow --      Pain Score 05/23/21 1025 6     Pain Loc --      Pain Edu? --      Excl. in [place]? --      Constitutional: Alert and oriented. Well appearing and in no acute distress. Eyes: Conjunctivae are normal. PERRL. EOMI. Head: Atraumatic. ENT:      Ears:       Nose: No congestion/rhinnorhea.      Mouth/Throat: Mucous membranes are moist.  Neck: No stridor.   Cardiovascular: Normal rate, regular rhythm. Normal S1 and S2.  Good peripheral circulation. Respiratory: Normal respiratory effort without tachypnea or retractions. Lungs CTAB. Good air entry to the bases with no decreased or absent breath  sounds. Gastrointestinal: Findings on physical exam reveals what appears to be mass/hernia along the left periumbilical region.  Palpation of this area reveals palpable lesion consistent with her known hernia.  This is very tender to palpation.  There is another lesion inferior but also feels consistent with hernia.  This is relatively nontender to palpation.  Bowel sounds 4 quadrants.  Soft to palpation throughout the rest of the abdomen.  There is tenderness extending from her hernia into the left lower quadrant..  Guarding with palpation around her hernia.  No other guarding.  No rigidity. No palpable masses. No distention. No CVA tenderness. Musculoskeletal: Full range of motion to all extremities. No gross deformities appreciated. Neurologic:  Normal speech and language. No gross focal neurologic deficits are appreciated.  Skin:  Skin is warm, dry and intact. No rash noted. Psychiatric: Mood and affect are normal. Speech and behavior are normal. Patient exhibits appropriate insight and judgement.   ____________________________________________   LABS (all labs ordered are listed, but only abnormal results are displayed)  Labs Reviewed  COMPREHENSIVE METABOLIC PANEL - Abnormal; Notable for the following components:      Result Value   Glucose, Bld 123 (*)    Calcium 8.7 (*)    All other components within normal limits  URINALYSIS, ROUTINE W REFLEX MICROSCOPIC - Abnormal; Notable for the following components:   Color, Urine YELLOW (*)    APPearance HAZY (*)    Hgb urine dipstick SMALL (*)    All other components within normal limits  LIPASE, BLOOD  CBC   ____________________________________________  EKG   ____________________________________________  RADIOLOGY I personally viewed and evaluated these images as part of my medical decision making, as well as reviewing the written report by the radiologist.  ED Provider Interpretation: 2 hernias identified, superior portion  containing inflamed fat is noted to be tender on exam.  No bowel entrapment.  Inferior hernia with bowel but no evidence of incarceration or obstruction  CT ABDOMEN PELVIS W CONTRAST  Result Date: 05/23/2021 CLINICAL DATA:  80 year old female with acute abdominal and pelvic pain. EXAM: CT ABDOMEN AND PELVIS WITH  CONTRAST TECHNIQUE: Multidetector CT imaging of the abdomen and pelvis was performed using the standard protocol following bolus administration of intravenous contrast. CONTRAST:  173mL OMNIPAQUE IOHEXOL 300 MG/ML  SOLN COMPARISON:  None. FINDINGS: Lower chest: No acute abnormality. Hepatobiliary: The liver is unremarkable. Cholelithiasis noted without CT evidence of acute cholecystitis. No biliary dilatation. Pancreas: Unremarkable Spleen: Unremarkable Adrenals/Urinary Tract: The kidneys, adrenal glands and bladder are unremarkable. Stomach/Bowel: Stomach is within normal limits. No evidence of bowel wall thickening, distention, or inflammatory changes. Colonic diverticulosis noted without evidence of acute diverticulitis. Vascular/Lymphatic: Aortic atherosclerosis. No enlarged abdominal or pelvic lymph nodes. Reproductive: Status post hysterectomy. No adnexal masses. Other: A moderate to large midline ventral periumbilical hernia containing inflamed fat is noted. A moderate to large LEFT paramedian ventral hernia is noted just below the above hernia and contains a portion of transverse colon, without evidence of bowel obstruction. No ascites, focal collection or pneumoperitoneum. Musculoskeletal: No acute or suspicious bony abnormalities are noted. IMPRESSION: 1. Moderate to large midline ventral periumbilical hernia containing inflamed fat. Moderate to large LEFT paramedian ventral hernia just below the above hernia containing a portion of transverse colon, without evidence of bowel obstruction. 2. Cholelithiasis without CT evidence of acute cholecystitis. 3. Aortic Atherosclerosis (ICD10-I70.0).  Electronically Signed   By: Margarette Canada M.D.   On: 05/23/2021 17:50    ____________________________________________    PROCEDURES  Procedure(s) performed:    Procedures    Medications  sodium chloride 0.9 % bolus 500 mL (0 mLs Intravenous Stopped 05/23/21 1741)  iohexol (OMNIPAQUE) 300 MG/ML solution 100 mL (100 mLs Intravenous Contrast Given 05/23/21 1722)     ____________________________________________   INITIAL IMPRESSION / ASSESSMENT AND PLAN / ED COURSE  Pertinent labs & imaging results that were available during my care of the patient were reviewed by me and considered in my medical decision making (see chart for details).  Review of the [place] CSRS was performed in accordance of the [place] prior to dispensing any controlled drugs.           Patient's diagnosis is consistent with periumbilical and ventral hernias.  Patient presented to the emergency department with abdominal pain described more as abdominal wall pain.  She has a history of a periumbilical hernia.  This area was very tender to palpation and there was 2 palpable findings which appear to be both in superior and inferior hernia.  Patient had pain over the superior aspect, no tenderness over the inferior hernia.  Superior hernia is fat-containing.  There is no evidence of bowel incarceration though there is stranding consistent with inflammation over the fat-containing hernia.  Inferior hernia contains bowel loops with no evidence of inflammation or obstruction.  No evidence of incarceration.  At this time labs are reassuring, with her findings on CT scan I have recommended close follow-up with general surgery outpatient for hernia repair.  Given the proximity of the tube with inflammation around the fat-containing when if patient has increasing abdominal pain, vomiting, constipation return to the ED.  Otherwise follow-up with general surgery.. Patient is given ED precautions to return to the ED for any worsening or  new symptoms.     ____________________________________________  FINAL CLINICAL IMPRESSION(S) / ED DIAGNOSES  Final diagnoses:  Periumbilical hernia  Ventral hernia without obstruction or gangrene      NEW MEDICATIONS STARTED DURING THIS VISIT:  ED Discharge Orders     None           This chart was dictated using voice  recognition software/Dragon. Despite best efforts to proofread, errors can occur which can change the meaning. Any change was purely unintentional.    Darletta Moll, PA-C 05/23/21 1944    Vladimir Crofts, MD 05/23/21 2149

## 2021-05-23 NOTE — ED Triage Notes (Signed)
Pt via POV from home. Pt c/o generalized lower abd pain since yesterday, pt states it radiates to her back. Pt was dx with a umbilical hernia but pt states the pain has never been constant. Pt endorse diarrhea. Denies NV. Denies urinary symptoms. Pt is A&Ox4 and NAD.

## 2021-05-23 NOTE — ED Provider Notes (Signed)
Emergency Medicine Provider Triage Evaluation Note  Cheryl Joseph , a 80 y.o. female  was evaluated in triage.  Pt complains of presents to the ER with complaint of not feeling well, abdominal pain and urinary retention decreased urinary output.  This started 2 days ago.  History of umbilical hernia diagnosed 10/2020.  She describes the pain as achy.  The pain is radiating into her back.  She denies urinary urgency, frequency, dysuria or blood in her urine.  She denies vaginal complaints.  She reports she had some diarrhea yesterday but otherwise typically has normal stools.  Review of Systems  Positive: Malaise, abdominal pain, decreased urinary output Negative: Headache, dizziness, visual changes, chest pain, chest tightness, nausea, vomiting, reflux, constipation, blood in her stool, urinary or vaginal complaints  Physical Exam  BP 137/71 (BP Location: Left Arm)   Pulse 92   Temp 98.3 F (36.8 C) (Oral)   Resp 20   Ht 5\' 3"  (1.6 m)   Wt 88.5 kg   SpO2 94%   BMI 34.54 kg/m  Gen:   Awake, no distress   Resp:  Normal effort  Abd:  Umbilical hernia noted, easily reducible but tender with palpation  Medical Decision Making  Medically screening exam initiated at 10:32 AM.  Appropriate orders placed.  Russ Halo was informed that the remainder of the evaluation will be completed by another provider, this initial triage assessment does not replace that evaluation, and the importance of remaining in the ED until their evaluation is complete.  Malaise, Abdominal Pain, Decreased Urinary Output, Umbilical Hernia CBC, CMET, Lipase, Urinalysis Will likely need [place] abd vs CT abd pending labs   Jearld Fenton, NP 05/23/21 1038    Naaman Plummer, MD 05/27/21 [phone]

## 2021-05-25 ENCOUNTER — Telehealth: Payer: Self-pay | Admitting: *Deleted

## 2021-05-25 DIAGNOSIS — K429 Umbilical hernia without obstruction or gangrene: Secondary | ICD-10-CM

## 2021-05-25 NOTE — Telephone Encounter (Signed)
PLEASE NOTE: All timestamps contained within this report are represented as [place] Standard Time. CONFIDENTIALTY NOTICE: This fax transmission is intended only for the addressee. It contains information that is legally privileged, confidential or otherwise protected from use or disclosure. If you are not the intended recipient, you are strictly prohibited from reviewing, disclosing, copying using or disseminating any of this information or taking any action in reliance on or regarding this information. If you have received this fax in error, please notify us immediately by telephone so that we can arrange for its return to [place]. Phone: [phone], Toll-Free: [phone], Fax: [phone] Page: 1 of 2 Call Id: [id number] [place] Night - Client TELEPHONE ADVICE RECORD [facility] Patient Name: Cheryl Joseph Gender: Female DOB: 1941-01-01 Age: 80 Y 36 M 7 D Return Phone Number: [phone] (Primary) Address: City/ State/ Zip: [place]  [address] Client [place] Night - Client Client Site [place] [facility] Tower, Roque Lias - MD Contact Type Call Who Is Calling Patient / Member / Family / Caregiver Call Type Triage / Clinical Relationship To Patient Self Return Phone Number [phone] (Primary) Chief Complaint Abdominal Pain Reason for Call Request to Schedule Office Appointment Initial Comment Caller states she has a hernia. She states her bakc hurts as well. Translation No Nurse Assessment Nurse: Rolin Barry, RN, Levada Dy Date/Time (Eastern Time): 05/23/2021 9:01:16 AM Confirm and document reason for call. If symptomatic, describe symptoms. ---Caller states she has a hernia. She states her back hurts as well. Caller advised that the hernia is on her left side. Abdominal pain. No temp. Chills at times. Abdominal pain increased in frequency since yesterday. Feels nauseated. Lower back  pain. Does the patient have any new or worsening symptoms? ---Yes Will a triage be completed? ---Yes Related visit to physician within the last 2 weeks? ---No Does the PT have any chronic conditions? (i.e. diabetes, asthma, this includes High risk factors for pregnancy, etc.) ---Yes List chronic conditions. ---umbilical hernia HTN Asthma Is this a behavioral health or substance abuse call? ---No Guidelines Guideline Title Affirmed Question Affirmed Notes Nurse Date/Time (Eastern Time) Hernia Hernia is painful or tender to touch Deaton, RN, Levada Dy 05/23/2021 9:05:47 AM Disp. Time Eilene Ghazi Time) Disposition Final User 05/23/2021 9:08:11 AM Go to ED Now Yes Deaton, RN, Levada Dy PLEASE NOTE: All timestamps contained within this report are represented as [place] Standard Time. CONFIDENTIALTY NOTICE: This fax transmission is intended only for the addressee. It contains information that is legally privileged, confidential or otherwise protected from use or disclosure. If you are not the intended recipient, you are strictly prohibited from reviewing, disclosing, copying using or disseminating any of this information or taking any action in reliance on or regarding this information. If you have received this fax in error, please notify us immediately by telephone so that we can arrange for its return to [place]. Phone: [phone], Toll-Free: [phone], Fax: [phone] Page: 2 of 2 Call Id: [id number] [place] Disagree/Comply Comply Caller Understands Yes PreDisposition Did not know what to do Care Advice Given Per Guideline GO TO ED NOW: * You need to be seen in the Emergency Department. * Go to the ED at ___________ [place] now. Drive carefully. CARE ADVICE given per Hernia (Adult) guideline NOTHING BY MOUTH: * Do not eat or drink anything for now. * Reason: Condition may need surgery and general anesthesia. ANOTHER ADULT SHOULD DRIVE: * It is better and safer if another adult drives  instead  of you. Referrals [facility] - ED

## 2021-05-25 NOTE — Telephone Encounter (Signed)
Dr. Glori Bickers sent me a message saying:  I reviewed ER note re: hernia. How is she doing? Does she have an appt with a surgeon or need a referral?

## 2021-05-25 NOTE — Telephone Encounter (Signed)
Patient notified by telephone that order has been placed per Dr. Glori Bickers.

## 2021-05-25 NOTE — Telephone Encounter (Signed)
Referral and notes faxed to requested provider  Nothing further needed.

## 2021-05-25 NOTE — Telephone Encounter (Signed)
The order is in, thanks

## 2021-05-25 NOTE — Telephone Encounter (Signed)
Called pt and she said thanked Dr. Glori Bickers for calling and checking on her. She said the ER found 2 hernia's she needs repaired. She  does have an appt on Thursday 05/28/21 with Candis Musa Daz, MD. Pt said she did call her insurance and they said that pt's PCP needs to put the referral in for this appt in order for it to be covered. Pt is asking if PCP will place referral.   Pt said she is still having some abd pain but it's not as bad and it's manageable until her appt on Thursday

## 2021-05-28 ENCOUNTER — Ambulatory Visit: Payer: Self-pay | Admitting: General Surgery

## 2021-05-28 DIAGNOSIS — K439 Ventral hernia without obstruction or gangrene: Secondary | ICD-10-CM | POA: Diagnosis not present

## 2021-05-28 NOTE — H&P (Signed)
PATIENT PROFILE: Cheryl Joseph is a 80 y.o. female who presents to the Clinic for consultation at the request of Dr. Tamala Julian for evaluation of abdominal pain.  PCP:  Tower, Carmell [place], MD  HISTORY OF PRESENT ILLNESS: Cheryl Joseph reports she has been having abdominal pain for the last few months.  She was told that she has a ventral hernia.  She was told that if the pain does not improve she should go to the emergency room.  She went to the emergency room last Friday.  In the emergency room she had a CT scan that shows 2 small hernias on the ventral area.  1 hernia has incarcerated fat and the other hernia has large intestine.  No sign of obstruction.  Patient evaluated the images of the CT scan.  Patient denies any nausea or vomiting.  Pain localized to the anterior abdominal wall.  Pain aggravated by certain abdominal wall movement.  There has been no alleviating factors.  Patient denies any previous surgeries in the midline.  Patient denies any chest pain.  Patient denies any shortness of breath.  Patient is able to do 4 METS without chest pain or shortness of breath.   PROBLEM LIST: Problem List  Date Reviewed: 03/26/2019          Noted   Routine general medical examination at a health care facility 11/23/2015   Overview    Last Assessment & Plan:  Reviewed health habits including diet and exercise and skin cancer prevention Reviewed appropriate screening tests for age  Also reviewed health mt list, fam hx and immunization status , as well as social and family history   See HPI amw rev Disc fall prevention  dexa ordered Pt will schedule her own mammogram  Enc better diet and wt loss      Diverticulosis of colon without hemorrhage 02/14/2015   Overview    Last Assessment & Plan:  I do wonder if this could be causing intermittent low abd pain /gas issues Disc fiber intake and avoidance of nuts and seeds  Will continue to follow      Diverticulitis of large intestine without perforation  or abscess without bleeding 05/25/2014   Overview    Last Assessment & Plan:  Actually much better today. Is given paper copies of cipro and flagyl to take if she worsens, encouraged to increase hydration and eat a bland diet for a few day.s start a probiotic and take meds only if worsens again      Lower abdominal pain 03/18/2014   Overview    Last Assessment & Plan:  Suspect multifactorial Intermittent - better now  Some IBS symptoms  Pt thinks worse since pelvic surgery- ? Poss adhesions Also hx of diverticulosis   Will begin daily fiber supplement  Ref to GI   Update if worse in meantime or if fever or blood in stool  Last Assessment & Plan:  Intermittent bilateral gas like pain -comes and goes Hx of diverticulosis and IBS in the past Enc fiber/more fluids in diet  Also avoidance of nuts and seeds Pelvic exam -noted rectocele (no tenderness)- doubt related  If worse or no imp consider imaging or GI ref      Essential hypertension 02/25/2014   Overview    Last Assessment & Plan:  Formatting of this note might be different from the original. bp in fair control at this time  BP Readings from Last 1 Encounters:  08/16/18 126/72   No changes needed Most recent  labs reviewed  Disc lifstyle change with low sodium diet and exercise      Postop check 12/27/2013   Rectocele 12/27/2013   Prolapse of female pelvic organs 10/30/2013   Preop examination 10/30/2013   Asthma 10/30/2013   Hyperglycemia 05/14/2009   Overview    Overview:  Qualifier: Diagnosis of  By: Glori Bickers MD, [place]:  Formatting of this note may be different from the original. Lab Results  Component Value Date   HGBA1C 6.1 08/14/2014   Disc imp of wt loss to prevent DM She does well with low sugar diet most of the time       Prediabetes 05/14/2009   Overview    Qualifier: Diagnosis of  By: Glori Bickers MD, [place]:  Formatting of this note might be different  from the original. Lab Results  Component Value Date   HGBA1C 5.9 07/31/2018   Previously diabetic-much better with diet and exercise now  No medications disc imp of low glycemic diet and wt loss to prevent DM2      Other specified disorders of bone density and structure, unspecified site 03/07/2007   Overview    Overview:  Qualifier: Diagnosis of  By: Glori Bickers MD, Carmell Austria   Last Assessment & Plan:  She is not ready to schedule dexa yet  Disc ca and D- inc D to 3000 iu daily for def  Disc need for calcium/ vitamin D/ wt bearing exercise and bone density test every 2 y to monitor Disc safety/ fracture risk in detail   No falls or fx       Osteopenia 03/07/2007   Overview    Qualifier: Diagnosis of  By: Glori Bickers MD, Carmell [place]   Last Assessment & Plan:  Due for 2y dexa- ordered Pt decided not to try the alendronate Continues ca and D One fall and no fx this year  Good exercise regimen as well      Hyperlipidemia 01/03/2007   Overview    Qualifier: Diagnosis of  By: Marcelino Scot CMA, [place]    Last Assessment & Plan:  Disc goals for lipids and reasons to control them Rev last labs with pt Rev low sat fat diet in detail  LDL and trig up a bit Disc watching red meat and fatty pork in diet  Continue simvastatin      Class 2 severe obesity due to excess calories with serious comorbidity and body mass index (BMI) of 35.0 to 35.9 in adult (CMS-HCC) 01/03/2007   Overview    Qualifier: History of  By: Marcelino Scot CMA, [place]    Last Assessment & Plan:  Discussed how this problem influences overall health and the risks it imposes  Reviewed plan for weight loss with lower calorie diet (via better food choices and also portion control or program like weight watchers) and exercise building up to or more than 30 minutes 5 days per week including some aerobic activity          GENERAL REVIEW OF SYSTEMS:   General ROS: negative for - chills, fatigue, fever, weight gain  or weight loss Allergy and Immunology ROS: negative for - hives  Hematological and Lymphatic ROS: negative for - bleeding problems or bruising, negative for palpable nodes Endocrine ROS: negative for - heat or cold intolerance, hair changes Respiratory ROS: negative for - cough, shortness of breath or wheezing Cardiovascular ROS: no chest pain or palpitations GI ROS: negative for  nausea, vomiting, diarrhea, constipation.  Positive for abdominal pain Musculoskeletal ROS: negative for - joint swelling or muscle pain Neurological ROS: negative for - confusion, syncope Dermatological ROS: negative for pruritus and rash Psychiatric: negative for anxiety, depression, difficulty sleeping and memory loss  MEDICATIONS: Current Outpatient Medications  Medication Sig Dispense Refill   ACETAMINOPHEN (TYLENOL ORAL) Take by mouth 2 (two) times daily.     albuterol 90 mcg/actuation inhaler INHALE TWO PUFFS BY MOUTH INTO THE LUNGS EVERY 4 HOURS AS NEEDED FOR WHEEZING AND SHORTNESS OF BREATH     amLODIPine (NORVASC) 5 MG tablet Take by mouth     b complex vitamins tablet Take by mouth.     CALCIUM CITRATE/VITAMIN D3 (CALCIUM CITRATE + ORAL) Take 1 tablet by mouth once daily.     co-enzyme Q-10, ubiquinone, 100 mg capsule Take 100 mg by mouth once daily.     GLUCOSAMINE/CHONDROITIN SULF A (GLUCOSAMINE-CHONDROITIN ORAL) Take 1 tablet by mouth once daily.     hyaluronate sodium-vit C 20-60 mg Cap Take by mouth.     LACTOBACILLUS ACIDOPHILUS (PROBIOTIC ORAL) Take by mouth.     montelukast (SINGULAIR) 10 mg tablet Take 10 mg by mouth nightly.      multivitamin capsule Take by mouth.     omega-3 fatty acids-vitamin E 1,000 mg Take 1 capsule by mouth once daily.     oxybutynin (OXYTROL) 3.9 mg/24 hr patch Place 1 patch onto the skin once a week.     SIMETHICONE (GAS RELIEF EXTRA STRENGTH ORAL) as needed.      simvastatin (ZOCOR) 40 MG tablet Take 40 mg by mouth nightly.      TURMERIC ROOT EXTRACT ORAL Take by  mouth.     ibuprofen (ADVIL,MOTRIN) 200 MG tablet Take 200 mg by mouth 2 (two) times daily. (Patient not taking: Reported on 05/28/2021)     ketoconazole (NIZORAL) 2 % cream Apply topically. (Patient not taking: Reported on 05/28/2021)     No current facility-administered medications for this visit.    ALLERGIES: Ezetimibe-simvastatin, Solifenacin, and Tolterodine tartrate  PAST MEDICAL HISTORY: Past Medical History:  Diagnosis Date   Arthritis    Hemorrhoids    Obesity     PAST SURGICAL HISTORY: Past Surgical History:  Procedure Laterality Date   CATARACT EXTRACTION     COLONOSCOPY  06/01/2004   Int Hemorrhoids, Diverticulosis: CBF 05/2014   COLONOSCOPY  05/01/2015   Int Hemorrhoids, Diverticulosis: No repeat due to age per RTE   COLPORRHAPHY FOR REPAIR RECTOCELE POSTERIOR N/A 11/13/2013   Procedure: COLPORRHAPHY FOR REPAIR [place];  Surgeon: Delanna Notice, MD;  Location: [facility];  Service: Gynecology;  Laterality: N/A;   CYSTOURETHROSCOPY N/A 11/13/2013   Procedure: CYSTOURETHROSCOPY;  Surgeon: Delanna Notice, MD;  Location: [facility];  Service: Gynecology;  Laterality: N/A;   LAPAROSCOPIC COLPOPEXY FOR SUSPENSION VAGINAL ROBOTIC N/A 11/13/2013   Procedure: ROBOT ASSISTED COLPOPEXY FOR SUSPENSION VAGINAL;  Surgeon: Delanna Notice, MD;  Location: [facility];  Service: Gynecology;  Laterality: N/A;   TONSILLECTOMY & ADENOIDECTOMY     wisdom teeth extraction       FAMILY HISTORY: Family History  Problem Relation Age of Onset   High blood pressure (Hypertension) Mother    Diabetes Mother    Stroke Mother    Heart disease Mother    Myocardial Infarction (Heart attack) Mother    Lung cancer Father    Heart disease Brother    Myocardial Infarction (Heart attack) Brother  Diabetes Maternal Aunt    Angina Maternal Grandfather    Anesthesia problems Neg Hx    Malignant hyperthermia Neg Hx      SOCIAL HISTORY: Social History   Socioeconomic  History   Marital status: Married  Tobacco Use   Smoking status: Never Smoker   Smokeless tobacco: Never Used  Substance and Sexual Activity   Alcohol use: No   Drug use: No   Sexual activity: Yes    Partners: Male    PHYSICAL EXAM: Vitals:   05/28/21 1044  BP: 139/75  Pulse: 66   Body mass index is 35.53 kg/m. Weight: 93.9 kg (207 lb)   GENERAL: Alert, active, oriented x3  HEENT: Pupils equal reactive to light. Extraocular movements are intact. Sclera clear. Palpebral conjunctiva normal red color.Pharynx clear.  NECK: Supple with no palpable mass and no adenopathy.  LUNGS: Sound clear with no rales rhonchi or wheezes.  HEART: Regular rhythm S1 and S2 without murmur.  ABDOMEN: Soft and depressible, nontender with no palpable mass, no hepatomegaly.  Palpable hernia in the midline.  Nonreducible.  No skin changes.  Nontender to palpation.  EXTREMITIES: Well-developed well-nourished symmetrical with no dependent edema.  NEUROLOGICAL: Awake alert oriented, facial expression symmetrical, moving all extremities.  REVIEW OF DATA: I have reviewed the following data today: No visits with results within 3 Month(s) from this visit.  Latest known visit with results is:  Office Visit on 05/19/2017  Component Date Value   Vent Rate (bpm) 05/19/2017 53    PR Interval (msec) 05/19/2017 126    QRS Interval (msec) 05/19/2017 84    QT Interval (msec) 05/19/2017 412    QTc (msec) 05/19/2017 386      ASSESSMENT: Ms. Molden is a 80 y.o. female presenting for consultation for incarcerated ventral hernia.  Patient with incarcerated ventral hernia seen on CT scan.  Physical exam consistent with incarcerated hernia.  No sign of strangulation at this moment.  Patient was oriented about the implication of having a incarcerated ventral hernia.  Patient oriented about recommendation of surgical management.  Patient oriented about robotic assist laparoscopic ventral hernia repair with mesh.   Patient oriented about the risk of surgery includes bleeding, infection, injury to other organs, infection of mesh, pain, intestinal obstruction, among others.  Patient reports he understood and agreed to proceed.  Ventral hernia without obstruction or gangrene [K43.9]  PLAN: 1.  Robotic assisted laparoscopic ventral hernia repair with mesh ([id number]) 2.  CBC, CMP done 05/23/2021 3.  Avoid taking aspirin 5 days before the surgery 4.  Contact us if you have any concern  Patient verbalized understanding, all questions were answered, and were agreeable with the plan outlined above.    Herbert Pun, MD  Electronically signed by Herbert Pun, MD

## 2021-05-28 NOTE — H&P (View-Only) (Signed)
PATIENT PROFILE: Cheryl Joseph is a 80 y.o. female who presents to the Clinic for consultation at the request of Dr. Tamala Julian for evaluation of abdominal pain.  PCP:  Tower, Carmell [place], MD  HISTORY OF PRESENT ILLNESS: Cheryl Joseph reports she has been having abdominal pain for the last few months.  She was told that she has a ventral hernia.  She was told that if the pain does not improve she should go to the emergency room.  She went to the emergency room last Friday.  In the emergency room she had a CT scan that shows 2 small hernias on the ventral area.  1 hernia has incarcerated fat and the other hernia has large intestine.  No sign of obstruction.  Patient evaluated the images of the CT scan.  Patient denies any nausea or vomiting.  Pain localized to the anterior abdominal wall.  Pain aggravated by certain abdominal wall movement.  There has been no alleviating factors.  Patient denies any previous surgeries in the midline.  Patient denies any chest pain.  Patient denies any shortness of breath.  Patient is able to do 4 METS without chest pain or shortness of breath.   PROBLEM LIST: Problem List  Date Reviewed: 03/26/2019          Noted   Routine general medical examination at a health care facility 11/23/2015   Overview    Last Assessment & Plan:  Reviewed health habits including diet and exercise and skin cancer prevention Reviewed appropriate screening tests for age  Also reviewed health mt list, fam hx and immunization status , as well as social and family history   See HPI amw rev Disc fall prevention  dexa ordered Pt will schedule her own mammogram  Enc better diet and wt loss      Diverticulosis of colon without hemorrhage 02/14/2015   Overview    Last Assessment & Plan:  I do wonder if this could be causing intermittent low abd pain /gas issues Disc fiber intake and avoidance of nuts and seeds  Will continue to follow      Diverticulitis of large intestine without perforation  or abscess without bleeding 05/25/2014   Overview    Last Assessment & Plan:  Actually much better today. Is given paper copies of cipro and flagyl to take if she worsens, encouraged to increase hydration and eat a bland diet for a few day.s start a probiotic and take meds only if worsens again      Lower abdominal pain 03/18/2014   Overview    Last Assessment & Plan:  Suspect multifactorial Intermittent - better now  Some IBS symptoms  Pt thinks worse since pelvic surgery- ? Poss adhesions Also hx of diverticulosis   Will begin daily fiber supplement  Ref to GI   Update if worse in meantime or if fever or blood in stool  Last Assessment & Plan:  Intermittent bilateral gas like pain -comes and goes Hx of diverticulosis and IBS in the past Enc fiber/more fluids in diet  Also avoidance of nuts and seeds Pelvic exam -noted rectocele (no tenderness)- doubt related  If worse or no imp consider imaging or GI ref      Essential hypertension 02/25/2014   Overview    Last Assessment & Plan:  Formatting of this note might be different from the original. bp in fair control at this time  BP Readings from Last 1 Encounters:  08/16/18 126/72   No changes needed Most recent  labs reviewed  Disc lifstyle change with low sodium diet and exercise      Postop check 12/27/2013   Rectocele 12/27/2013   Prolapse of female pelvic organs 10/30/2013   Preop examination 10/30/2013   Asthma 10/30/2013   Hyperglycemia 05/14/2009   Overview    Overview:  Qualifier: Diagnosis of  By: Glori Bickers MD, [place]:  Formatting of this note may be different from the original. Lab Results  Component Value Date   HGBA1C 6.1 08/14/2014   Disc imp of wt loss to prevent DM She does well with low sugar diet most of the time       Prediabetes 05/14/2009   Overview    Qualifier: Diagnosis of  By: Glori Bickers MD, [place]:  Formatting of this note might be different  from the original. Lab Results  Component Value Date   HGBA1C 5.9 07/31/2018   Previously diabetic-much better with diet and exercise now  No medications disc imp of low glycemic diet and wt loss to prevent DM2      Other specified disorders of bone density and structure, unspecified site 03/07/2007   Overview    Overview:  Qualifier: Diagnosis of  By: Glori Bickers MD, Carmell Austria   Last Assessment & Plan:  She is not ready to schedule dexa yet  Disc ca and D- inc D to 3000 iu daily for def  Disc need for calcium/ vitamin D/ wt bearing exercise and bone density test every 2 y to monitor Disc safety/ fracture risk in detail   No falls or fx       Osteopenia 03/07/2007   Overview    Qualifier: Diagnosis of  By: Glori Bickers MD, Carmell Austria   Last Assessment & Plan:  Due for 2y dexa- ordered Pt decided not to try the alendronate Continues ca and D One fall and no fx this year  Good exercise regimen as well      Hyperlipidemia 01/03/2007   Overview    Qualifier: Diagnosis of  By: Marcelino Scot CMA, [place]    Last Assessment & Plan:  Disc goals for lipids and reasons to control them Rev last labs with pt Rev low sat fat diet in detail  LDL and trig up a bit Disc watching red meat and fatty pork in diet  Continue simvastatin      Class 2 severe obesity due to excess calories with serious comorbidity and body mass index (BMI) of 35.0 to 35.9 in adult (CMS-HCC) 01/03/2007   Overview    Qualifier: History of  By: Marcelino Scot CMA, [place]    Last Assessment & Plan:  Discussed how this problem influences overall health and the risks it imposes  Reviewed plan for weight loss with lower calorie diet (via better food choices and also portion control or program like weight watchers) and exercise building up to or more than 30 minutes 5 days per week including some aerobic activity          GENERAL REVIEW OF SYSTEMS:   General ROS: negative for - chills, fatigue, fever, weight gain  or weight loss Allergy and Immunology ROS: negative for - hives  Hematological and Lymphatic ROS: negative for - bleeding problems or bruising, negative for palpable nodes Endocrine ROS: negative for - heat or cold intolerance, hair changes Respiratory ROS: negative for - cough, shortness of breath or wheezing Cardiovascular ROS: no chest pain or palpitations GI ROS: negative for  nausea, vomiting, diarrhea, constipation.  Positive for abdominal pain Musculoskeletal ROS: negative for - joint swelling or muscle pain Neurological ROS: negative for - confusion, syncope Dermatological ROS: negative for pruritus and rash Psychiatric: negative for anxiety, depression, difficulty sleeping and memory loss  MEDICATIONS: Current Outpatient Medications  Medication Sig Dispense Refill   ACETAMINOPHEN (TYLENOL ORAL) Take by mouth 2 (two) times daily.     albuterol 90 mcg/actuation inhaler INHALE TWO PUFFS BY MOUTH INTO THE LUNGS EVERY 4 HOURS AS NEEDED FOR WHEEZING AND SHORTNESS OF BREATH     amLODIPine (NORVASC) 5 MG tablet Take by mouth     b complex vitamins tablet Take by mouth.     CALCIUM CITRATE/VITAMIN D3 (CALCIUM CITRATE + ORAL) Take 1 tablet by mouth once daily.     co-enzyme Q-10, ubiquinone, 100 mg capsule Take 100 mg by mouth once daily.     GLUCOSAMINE/CHONDROITIN SULF A (GLUCOSAMINE-CHONDROITIN ORAL) Take 1 tablet by mouth once daily.     hyaluronate sodium-vit C 20-60 mg Cap Take by mouth.     LACTOBACILLUS ACIDOPHILUS (PROBIOTIC ORAL) Take by mouth.     montelukast (SINGULAIR) 10 mg tablet Take 10 mg by mouth nightly.      multivitamin capsule Take by mouth.     omega-3 fatty acids-vitamin E 1,000 mg Take 1 capsule by mouth once daily.     oxybutynin (OXYTROL) 3.9 mg/24 hr patch Place 1 patch onto the skin once a week.     SIMETHICONE (GAS RELIEF EXTRA STRENGTH ORAL) as needed.      simvastatin (ZOCOR) 40 MG tablet Take 40 mg by mouth nightly.      TURMERIC ROOT EXTRACT ORAL Take by  mouth.     ibuprofen (ADVIL,MOTRIN) 200 MG tablet Take 200 mg by mouth 2 (two) times daily. (Patient not taking: Reported on 05/28/2021)     ketoconazole (NIZORAL) 2 % cream Apply topically. (Patient not taking: Reported on 05/28/2021)     No current facility-administered medications for this visit.    ALLERGIES: Ezetimibe-simvastatin, Solifenacin, and Tolterodine tartrate  PAST MEDICAL HISTORY: Past Medical History:  Diagnosis Date   Arthritis    Hemorrhoids    Obesity     PAST SURGICAL HISTORY: Past Surgical History:  Procedure Laterality Date   CATARACT EXTRACTION     COLONOSCOPY  06/01/2004   Int Hemorrhoids, Diverticulosis: CBF 05/2014   COLONOSCOPY  05/01/2015   Int Hemorrhoids, Diverticulosis: No repeat due to age per RTE   COLPORRHAPHY FOR REPAIR RECTOCELE POSTERIOR N/A 11/13/2013   Procedure: COLPORRHAPHY FOR REPAIR [place];  Surgeon: Delanna Notice, MD;  Location: [place];  Service: Gynecology;  Laterality: N/A;   CYSTOURETHROSCOPY N/A 11/13/2013   Procedure: CYSTOURETHROSCOPY;  Surgeon: Delanna Notice, MD;  Location: [place];  Service: Gynecology;  Laterality: N/A;   LAPAROSCOPIC COLPOPEXY FOR SUSPENSION VAGINAL ROBOTIC N/A 11/13/2013   Procedure: ROBOT ASSISTED COLPOPEXY FOR SUSPENSION VAGINAL;  Surgeon: Delanna Notice, MD;  Location: [place];  Service: Gynecology;  Laterality: N/A;   TONSILLECTOMY & ADENOIDECTOMY     wisdom teeth extraction       FAMILY HISTORY: Family History  Problem Relation Age of Onset   High blood pressure (Hypertension) Mother    Diabetes Mother    Stroke Mother    Heart disease Mother    Myocardial Infarction (Heart attack) Mother    Lung cancer Father    Heart disease Brother    Myocardial Infarction (Heart attack) Brother  Diabetes Maternal Aunt    Angina Maternal Grandfather    Anesthesia problems Neg Hx    Malignant hyperthermia Neg Hx      SOCIAL HISTORY: Social History   Socioeconomic  History   Marital status: Married  Tobacco Use   Smoking status: Never Smoker   Smokeless tobacco: Never Used  Substance and Sexual Activity   Alcohol use: No   Drug use: No   Sexual activity: Yes    Partners: Male    PHYSICAL EXAM: Vitals:   05/28/21 1044  BP: 139/75  Pulse: 66   Body mass index is 35.53 kg/m. Weight: 93.9 kg (207 lb)   GENERAL: Alert, active, oriented x3  HEENT: Pupils equal reactive to light. Extraocular movements are intact. Sclera clear. Palpebral conjunctiva normal red color.Pharynx clear.  NECK: Supple with no palpable mass and no adenopathy.  LUNGS: Sound clear with no rales rhonchi or wheezes.  HEART: Regular rhythm S1 and S2 without murmur.  ABDOMEN: Soft and depressible, nontender with no palpable mass, no hepatomegaly.  Palpable hernia in the midline.  Nonreducible.  No skin changes.  Nontender to palpation.  EXTREMITIES: Well-developed well-nourished symmetrical with no dependent edema.  NEUROLOGICAL: Awake alert oriented, facial expression symmetrical, moving all extremities.  REVIEW OF DATA: I have reviewed the following data today: No visits with results within 3 Month(s) from this visit.  Latest known visit with results is:  Office Visit on 05/19/2017  Component Date Value   Vent Rate (bpm) 05/19/2017 53    PR Interval (msec) 05/19/2017 126    QRS Interval (msec) 05/19/2017 84    QT Interval (msec) 05/19/2017 412    QTc (msec) 05/19/2017 386      ASSESSMENT: Ms. Cabell is a 80 y.o. female presenting for consultation for incarcerated ventral hernia.  Patient with incarcerated ventral hernia seen on CT scan.  Physical exam consistent with incarcerated hernia.  No sign of strangulation at this moment.  Patient was oriented about the implication of having a incarcerated ventral hernia.  Patient oriented about recommendation of surgical management.  Patient oriented about robotic assist laparoscopic ventral hernia repair with mesh.   Patient oriented about the risk of surgery includes bleeding, infection, injury to other organs, infection of mesh, pain, intestinal obstruction, among others.  Patient reports he understood and agreed to proceed.  Ventral hernia without obstruction or gangrene [K43.9]  PLAN: 1.  Robotic assisted laparoscopic ventral hernia repair with mesh ([id number]) 2.  CBC, CMP done 05/23/2021 3.  Avoid taking aspirin 5 days before the surgery 4.  Contact us if you have any concern  Patient verbalized understanding, all questions were answered, and were agreeable with the plan outlined above.    Herbert Pun, MD  Electronically signed by Herbert Pun, MD

## 2021-06-04 ENCOUNTER — Encounter
Admission: RE | Admit: 2021-06-04 | Discharge: 2021-06-04 | Disposition: A | Discharge status: Home / Self Care | Payer: Medicare HMO | Source: Ambulatory Visit | Attending: General Surgery | Admitting: General Surgery

## 2021-06-04 ENCOUNTER — Other Ambulatory Visit: Payer: Medicare HMO

## 2021-06-04 ENCOUNTER — Other Ambulatory Visit: Payer: Self-pay

## 2021-06-04 DIAGNOSIS — I10 Essential (primary) hypertension: Secondary | ICD-10-CM | POA: Diagnosis not present

## 2021-06-04 DIAGNOSIS — Z01818 Encounter for other preprocedural examination: Secondary | ICD-10-CM | POA: Diagnosis not present

## 2021-06-04 DIAGNOSIS — Z01810 Encounter for preprocedural cardiovascular examination: Secondary | ICD-10-CM | POA: Insufficient documentation

## 2021-06-04 DIAGNOSIS — R54 Age-related physical debility: Secondary | ICD-10-CM | POA: Diagnosis not present

## 2021-06-04 HISTORY — DX: Unspecified cataract: H26.9

## 2021-06-04 HISTORY — DX: Other and unspecified ventral hernia with obstruction, without gangrene: K43.6

## 2021-06-04 HISTORY — DX: Prediabetes: R73.03

## 2021-06-04 NOTE — Patient Instructions (Addendum)
Your procedure is scheduled on: Wednesday, November 16 Report to the Registration Desk on the 1st floor of the [facility]. To find out your arrival time, please call [phone] between 1PM - 3PM on: Tuesday, November 15  REMEMBER: Instructions that are not followed completely may result in serious medical risk, up to and including death; or upon the discretion of your surgeon and anesthesiologist your surgery may need to be rescheduled.  Do not eat food after midnight the night before surgery.  No gum chewing, lozengers or hard candies.  You may however, drink CLEAR liquids up to 2 hours before you are scheduled to arrive for your surgery. Do not drink anything within 2 hours of your scheduled arrival time.  Clear liquids include: - water  - apple juice without pulp - gatorade (not RED, PURPLE, OR BLUE) - black coffee or tea (Do NOT add milk or creamers to the coffee or tea) Do NOT drink anything that is not on this list.  TAKE THESE MEDICATIONS THE MORNING OF SURGERY WITH A SIP OF WATER:  Albuterol inhaler  Use inhalers on the day of surgery and bring to the hospital.  One week prior to surgery: starting November 9 Stop Anti-inflammatories (NSAIDS) such as Advil, Aleve, Ibuprofen, Motrin, Naproxen, Naprosyn and Aspirin based products such as Excedrin, Goodys Powder, BC Powder. Stop ALL OVER THE COUNTER supplements until after surgery. STOP B complex vitamins, calcium, vitamin D, Hyaluronic acid, fish oil, bone up, probiotic, AZO bladder, turmeric, ubiquinol You may however, continue to take Tylenol if needed for pain up until the day of surgery.  No Alcohol for 24 hours before or after surgery.  No Smoking including e-cigarettes for 24 hours prior to surgery.   On the morning of surgery brush your teeth with toothpaste and water, you may rinse your mouth with mouthwash if you wish. Do not swallow any toothpaste or mouthwash.  Use CHG Soap as directed on instruction  sheet.  Do not wear jewelry, make-up, hairpins, clips or nail polish.  Do not wear lotions, powders, or perfumes.   Do not shave body from the neck down 48 hours prior to surgery just in case you cut yourself which could leave a site for infection.  Also, freshly shaved skin may become irritated if using the CHG soap.  Contact lenses, hearing aids and dentures may not be worn into surgery.  Do not bring valuables to the hospital. [facility] is not responsible for any missing/lost belongings or valuables.   Notify your doctor if there is any change in your medical condition (cold, fever, infection).  Wear comfortable clothing (specific to your surgery type) to the hospital.  After surgery, you can help prevent lung complications by doing breathing exercises.  Take deep breaths and cough every 1-2 hours. Your doctor may order a device called an Incentive Spirometer to help you take deep breaths. When coughing or sneezing, hold a pillow firmly against your incision with both hands. This is called "splinting." Doing this helps protect your incision. It also decreases belly discomfort.  If you are being discharged the day of surgery, you will not be allowed to drive home. You will need a responsible adult (18 years or older) to drive you home and stay with you that night.   If you are taking public transportation, you will need to have a responsible adult (18 years or older) with you. Please confirm with your physician that it is acceptable to use public transportation.   Please  call the [facility] at [phone] if you have any questions about these instructions.  Surgery Visitation Policy:  Patients undergoing a surgery or procedure may have one family member or support person with them as long as that person is not COVID-19 positive or experiencing its symptoms.  That person may remain in the waiting area during the procedure and may rotate out with other people.

## 2021-06-08 ENCOUNTER — Ambulatory Visit (INDEPENDENT_AMBULATORY_CARE_PROVIDER_SITE_OTHER): Payer: Medicare HMO

## 2021-06-08 DIAGNOSIS — K429 Umbilical hernia without obstruction or gangrene: Secondary | ICD-10-CM

## 2021-06-08 DIAGNOSIS — I10 Essential (primary) hypertension: Secondary | ICD-10-CM

## 2021-06-08 DIAGNOSIS — E7849 Other hyperlipidemia: Secondary | ICD-10-CM

## 2021-06-08 DIAGNOSIS — K5730 Diverticulosis of large intestine without perforation or abscess without bleeding: Secondary | ICD-10-CM

## 2021-06-08 NOTE — Patient Instructions (Signed)
Visit Information:  Thank you for taking the time to speak with me today.   Patient will self administer medications as prescribed as evidenced by self report/primary caregiver report  Patient will attend all scheduled provider appointments as evidenced by clinician review of documented attendance to scheduled appointments and patient/caregiver report Patient will call pharmacy for medication refills as evidenced by patient report and review of pharmacy fill history as appropriate Patient will call provider office for new concerns or questions as evidenced by review of documented incoming telephone call notes and patient report - check blood pressure weekly - take blood pressure log to all doctor appointments - call doctor for signs and symptoms of high blood pressure - keep all doctor appointments - take medications for blood pressure exactly as prescribed - report new symptoms to your doctor - eat more whole grains, fruits and vegetables, lean meats and healthy fats - Follow pre- surgery advisement given to you by surgeon.  Contact their office if you have questions.   Patient verbalizes understanding of instructions provided today and agrees to view in [place].   The patient has been provided with contact information for the care management team and has been advised to call with any health related questions or concerns.  The care management team will reach out to the patient on July 02, 2021 at 12:00 noon.    Quinn Plowman RN,BSN,CCM RN Case Manager [place]  [phone]

## 2021-06-08 NOTE — Chronic Care Management (AMB) (Signed)
Chronic Care Management   CCM RN Visit Note  06/08/2021 Name: Cheryl Joseph MRN: [id number] DOB: 10/27/1940  Subjective: Cheryl Joseph is a 80 y.o. year old female who is a primary care patient of Tower, Wynelle Fanny, MD. The care management team was consulted for assistance with disease management and care coordination needs.    Engaged with patient by telephone for follow up visit in response to provider referral for case management and/or care coordination services.   Consent to Services:  The patient was given information about Chronic Care Management services, agreed to services, and gave verbal consent prior to initiation of services.  Please see initial visit note for detailed documentation.   Patient agreed to services and verbal consent obtained.   Assessment: Review of patient past medical history, allergies, medications, health status, including review of consultants reports, laboratory and other test data, was performed as part of comprehensive evaluation and provision of chronic care management services.   SDOH (Social Determinants of Health) assessments and interventions performed:    CCM Care Plan  Allergies  Allergen Reactions   Tolterodine Tartrate Other (See Comments)    Unknown (back ache)   Vesicare [Solifenacin Succinate]     Eye problems     Outpatient Encounter Medications as of 06/08/2021  Medication Sig   acetaminophen (TYLENOL) 500 MG tablet Take 1,000 mg by mouth every 6 (six) hours as needed for mild pain.   albuterol (VENTOLIN HFA) 108 (90 Base) MCG/ACT inhaler INHALE TWO PUFFS BY MOUTH EVERY 4 HOURS AS NEEDED FOR WHEEZING AND 2 PUFFS BEFORE EXERCISE OR EXPOSURE TO COLD AIR   amLODipine (NORVASC) 5 MG tablet Take 1 tablet (5 mg total) by mouth daily. (Patient taking differently: Take 5 mg by mouth at bedtime.)   b complex vitamins tablet Take 1 tablet by mouth every Monday, Wednesday, and Friday.   calcium carbonate (OS-CAL - DOSED IN MG OF  ELEMENTAL CALCIUM) 1250 (500 Ca) MG tablet Take 2 tablets by mouth daily with breakfast.   Cholecalciferol (VITAMIN D3 PO) Take 2 tablets by mouth daily.   Hyaluronic Acid-Vitamin C (HYALURONIC ACID PO) Take 1 tablet by mouth every Monday, Wednesday, and Friday.   montelukast (SINGULAIR) 10 MG tablet Take 1 tablet (10 mg total) by mouth at bedtime.   Multiple Vitamin (MULTIVITAMIN) capsule Take 1 capsule by mouth daily. Reported on 12/18/2015   Omega-3 Fatty Acids (FISH OIL PO) Take 1 capsule by mouth daily. Reported on 12/18/2015   OVER THE COUNTER MEDICATION Take 2 capsules by mouth daily. BONE UP   oxybutynin (OXYTROL) 3.9 MG/24HR Place 1 patch onto the skin every 3 (three) days. (Patient taking differently: Place 1 patch onto the skin 2 (two) times a week. Wed and Sun)   Probiotic Product (PROBIOTIC DAILY PO) Take 1 tablet by mouth daily.   psyllium (METAMUCIL SMOOTH TEXTURE) 28 % packet Take 1 packet by mouth daily.   Pumpkin Seed-Soy Germ (AZO BLADDER CONTROL/GO-LESS PO) Take 1 capsule by mouth at bedtime.   simvastatin (ZOCOR) 40 MG tablet TAKE 1 TABLET BY MOUTH AT BEDTIME   TURMERIC PO Take 720 mg by mouth every Monday, Wednesday, and Friday.   Ubiquinol 100 MG CAPS Take 1 capsule by mouth every Monday, Wednesday, and Friday.   No facility-administered encounter medications on file as of 06/08/2021.    Patient Active Problem List   Diagnosis Date Noted   Umbilical hernia 91/50/4136   Frequent stools 01/04/2020   Pre-operative general physical examination 07/06/2019  Lower abdominal pain 08/16/2018   Stress reaction 09/05/2017   Estrogen deficiency 05/25/2016   Routine general medical examination at a health care facility 11/23/2015   Hammertoe 10/28/2015   Frequent loose stools 02/14/2015   Diverticulosis of colon without hemorrhage 02/14/2015   Colon cancer screening 08/19/2014   Essential hypertension 02/25/2014   Encounter for Medicare annual wellness exam 07/16/2013    Prediabetes 05/14/2009   Rheumatoid arthritis with positive rheumatoid factor ([place]) 05/13/2008   Osteopenia 03/07/2007   Hyperlipidemia 01/03/2007   Obesity (BMI 30-39.9) 01/03/2007   ALLERGIC RHINITIS, SEASONAL 01/03/2007   ASTHMA 01/03/2007   OVERACTIVE BLADDER 01/03/2007    Conditions to be addressed/monitored:HTN, HLD, and Pre-surgical education   Care Plan : [facility] plan of care  Updates made by Dannielle Karvonen, RN since 06/08/2021 12:00 AM     Problem: Chronic disease management education and care coordination needs ( Hypertension, Hyperlipidemia)   Priority: High     Long-Range Goal: Development of plan of care to address chronic disease management and care coordination needs ( Hypertension, hyperlipidemia)   Start Date: 06/08/2021  Expected End Date: 09/22/2021  Priority: High  Note:   Current Barriers:  Knowledge Deficits related to plan of care for management of HTN and HLD  Chronic Disease Management support and education needs related to HTN and HLD Patient reports her blood pressures are ranging from 119/67 to 143/61.  Patient reports having an ED visit on 05/23/2021 due to hernia pain.  She states she is now scheduled to have hernia surgery on 05/26/2021.  Patient expressed some anxiety related to surgery.  RNCM provided reassurance and allowed patient to discuss her pre-surgical advisement from the surgeon.  RNCM Clinical Goal(s):  Patient will verbalize understanding of plan for management of HTN and HLD through collaboration with RN case manager, provider and care team.  take all medications exactly as prescribed and will call provider for medication related questions attend all scheduled medical appointments continue to work with RN Care Manager to address care management and care coordination needs related to  HTN and HLD through collaboration with RN Care manager, provider, and care team.   Interventions: 1:1 collaboration with primary care provider regarding  development and update of comprehensive plan of care as evidenced by provider attestation and co-signature Inter-disciplinary care team collaboration (see longitudinal plan of care) Evaluation of current treatment plan related to  self management and patient's adherence to plan as established by provider  Hypertension Interventions:  On Track Last practice recorded BP readings:  BP Readings from Last 3 Encounters:  05/23/21 (!) 152/69  01/05/21 136/74  10/29/20 122/66  Most recent eGFR/CrCl: No results found for: EGFR  No components found for: CRCL  Evaluation of current treatment plan related to hypertension self management and patient's adherence to plan as established by provider; Reviewed medications with patient and discussed importance of compliance; Discussed plans with patient for ongoing care management follow up and provided patient with direct contact information for care management team; Advised patient, providing education and rationale, to monitor blood pressure daily and record, calling PCP for findings outside established parameters;  Reviewed scheduled/upcoming provider appointments including:  Hyperlipidemia Interventions:  On Track Medication review performed; medication list updated in electronic medical record.  Provider established cholesterol goals reviewed; Counseled on importance of regular laboratory monitoring as prescribed; Reviewed importance of limiting foods high in cholesterol; Reviewed exercise goals and target of 150 minutes per week;   Pre- procedure education New goal. Evaluation of current treatment  plan related to  Hernia surgery scheduled for 06/10/2021 ,   Discussed plans with patient for ongoing care management follow up and provided patient with direct contact information for care management team Advised patient to contact surgeon's office for questions/ concerns;  Allowed patient to express her feelings regarding upcoming surgery Allowed patient  opportunity to discuss pre- surgical advisement given to her by surgeon.   Patient Goals/Self-Care Activities: Patient will self administer medications as prescribed as evidenced by self report/primary caregiver report  Patient will attend all scheduled provider appointments as evidenced by clinician review of documented attendance to scheduled appointments and patient/caregiver report Patient will call pharmacy for medication refills as evidenced by patient report and review of pharmacy fill history as appropriate Patient will call provider office for new concerns or questions as evidenced by review of documented incoming telephone call notes and patient report - check blood pressure weekly - take blood pressure log to all doctor appointments - call doctor for signs and symptoms of high blood pressure - keep all doctor appointments - take medications for blood pressure exactly as prescribed - report new symptoms to your doctor - eat more whole grains, fruits and vegetables, lean meats and healthy fats - Follow pre- surgery advisement given to you by surgeon.  Contact their office if you have questions.            Plan:The patient has been provided with contact information for the care management team and has been advised to call with any health related questions or concerns.  The care management team will reach out to the patient again over the next 1 month . Quinn Plowman RN,BSN,CCM RN Case Manager [place]  [phone]

## 2021-06-10 ENCOUNTER — Encounter: Payer: Self-pay | Admitting: General Surgery

## 2021-06-10 ENCOUNTER — Ambulatory Visit: Payer: Medicare HMO | Admitting: Anesthesiology

## 2021-06-10 ENCOUNTER — Encounter
Admission: RE | Disposition: A | Discharge status: Home / Self Care | Payer: Self-pay | Source: Home / Self Care | Attending: General Surgery

## 2021-06-10 ENCOUNTER — Other Ambulatory Visit: Payer: Self-pay

## 2021-06-10 ENCOUNTER — Ambulatory Visit
Admission: RE | Admit: 2021-06-10 | Discharge: 2021-06-10 | Disposition: A | Discharge status: Home / Self Care | Payer: Medicare HMO | Attending: General Surgery | Admitting: General Surgery

## 2021-06-10 DIAGNOSIS — I10 Essential (primary) hypertension: Secondary | ICD-10-CM | POA: Diagnosis not present

## 2021-06-10 DIAGNOSIS — R7303 Prediabetes: Secondary | ICD-10-CM | POA: Insufficient documentation

## 2021-06-10 DIAGNOSIS — Z6835 Body mass index (BMI) 35.0-35.9, adult: Secondary | ICD-10-CM | POA: Diagnosis not present

## 2021-06-10 DIAGNOSIS — K436 Other and unspecified ventral hernia with obstruction, without gangrene: Secondary | ICD-10-CM | POA: Insufficient documentation

## 2021-06-10 DIAGNOSIS — M1990 Unspecified osteoarthritis, unspecified site: Secondary | ICD-10-CM | POA: Diagnosis not present

## 2021-06-10 DIAGNOSIS — K439 Ventral hernia without obstruction or gangrene: Secondary | ICD-10-CM | POA: Diagnosis not present

## 2021-06-10 DIAGNOSIS — E669 Obesity, unspecified: Secondary | ICD-10-CM | POA: Insufficient documentation

## 2021-06-10 DIAGNOSIS — K430 Incisional hernia with obstruction, without gangrene: Secondary | ICD-10-CM | POA: Diagnosis not present

## 2021-06-10 DIAGNOSIS — K432 Incisional hernia without obstruction or gangrene: Secondary | ICD-10-CM | POA: Diagnosis not present

## 2021-06-10 DIAGNOSIS — J45909 Unspecified asthma, uncomplicated: Secondary | ICD-10-CM | POA: Diagnosis not present

## 2021-06-10 HISTORY — PX: XI ROBOTIC ASSISTED VENTRAL HERNIA: SHX6789

## 2021-06-10 SURGERY — REPAIR, HERNIA, VENTRAL, ROBOT-ASSISTED
Anesthesia: General | Site: Abdomen

## 2021-06-10 MED ORDER — LIDOCAINE HCL (CARDIAC) PF 100 MG/5ML IV SOSY
PREFILLED_SYRINGE | INTRAVENOUS | Status: DC | PRN
  Administered 2021-06-10: 60 mg via INTRAVENOUS

## 2021-06-10 MED ORDER — BUPIVACAINE-EPINEPHRINE (PF) 0.25% -1:200000 IJ SOLN
INTRAMUSCULAR | Status: DC | PRN
  Administered 2021-06-10: 30 mL

## 2021-06-10 MED ORDER — DEXMEDETOMIDINE (PRECEDEX) IN NS 20 MCG/5ML (4 MCG/ML) IV SYRINGE
PREFILLED_SYRINGE | INTRAVENOUS | Status: DC | PRN
  Administered 2021-06-10 (×5): 4 ug via INTRAVENOUS

## 2021-06-10 MED ORDER — SUGAMMADEX SODIUM 200 MG/2ML IV SOLN
INTRAVENOUS | Status: DC | PRN
  Administered 2021-06-10: 200 mg via INTRAVENOUS

## 2021-06-10 MED ORDER — ACETAMINOPHEN 10 MG/ML IV SOLN
INTRAVENOUS | Status: DC | PRN
  Administered 2021-06-10: 1000 mg via INTRAVENOUS

## 2021-06-10 MED ORDER — FENTANYL CITRATE (PF) 100 MCG/2ML IJ SOLN
INTRAMUSCULAR | Status: DC | PRN
  Administered 2021-06-10 (×4): 50 ug via INTRAVENOUS

## 2021-06-10 MED ORDER — OXYCODONE HCL 5 MG/5ML PO SOLN: 5.0000 mg | Freq: Once | ORAL | Status: AC | PRN

## 2021-06-10 MED ORDER — ACETAMINOPHEN 10 MG/ML IV SOLN: 1000.0000 mg | Freq: Once | INTRAVENOUS | Status: DC | PRN

## 2021-06-10 MED ORDER — FAMOTIDINE 20 MG PO TABS: 20.0000 mg | ORAL_TABLET | Freq: Once | ORAL | Status: AC

## 2021-06-10 MED ORDER — DEXMEDETOMIDINE (PRECEDEX) IN NS 20 MCG/5ML (4 MCG/ML) IV SYRINGE
PREFILLED_SYRINGE | INTRAVENOUS | Status: AC
  Filled 2021-06-10: qty 5

## 2021-06-10 MED ORDER — PROPOFOL 500 MG/50ML IV EMUL
INTRAVENOUS | Status: DC | PRN
  Administered 2021-06-10: 125 ug/kg/min via INTRAVENOUS

## 2021-06-10 MED ORDER — OXYCODONE HCL 5 MG PO TABS
5.0000 mg | ORAL_TABLET | Freq: Once | ORAL | Status: AC | PRN
  Administered 2021-06-10: 5 mg via ORAL

## 2021-06-10 MED ORDER — ONDANSETRON HCL 4 MG/2ML IJ SOLN
INTRAMUSCULAR | Status: AC
  Filled 2021-06-10: qty 2

## 2021-06-10 MED ORDER — ROCURONIUM BROMIDE 10 MG/ML (PF) SYRINGE
PREFILLED_SYRINGE | INTRAVENOUS | Status: AC
  Filled 2021-06-10: qty 10

## 2021-06-10 MED ORDER — FENTANYL CITRATE (PF) 100 MCG/2ML IJ SOLN
INTRAMUSCULAR | Status: AC
  Administered 2021-06-10: 50 ug via INTRAVENOUS
  Filled 2021-06-10: qty 2

## 2021-06-10 MED ORDER — DEXAMETHASONE SODIUM PHOSPHATE 10 MG/ML IJ SOLN
INTRAMUSCULAR | Status: AC
  Filled 2021-06-10: qty 1

## 2021-06-10 MED ORDER — FENTANYL CITRATE (PF) 100 MCG/2ML IJ SOLN
INTRAMUSCULAR | Status: AC
  Filled 2021-06-10: qty 2

## 2021-06-10 MED ORDER — KETOROLAC TROMETHAMINE 30 MG/ML IJ SOLN
INTRAMUSCULAR | Status: DC | PRN
  Administered 2021-06-10: 30 mg via INTRAVENOUS

## 2021-06-10 MED ORDER — LACTATED RINGERS IV SOLN: INTRAVENOUS | Status: DC

## 2021-06-10 MED ORDER — PHENYLEPHRINE HCL (PRESSORS) 10 MG/ML IV SOLN
INTRAVENOUS | Status: DC | PRN
  Administered 2021-06-10: 80 ug via INTRAVENOUS
  Administered 2021-06-10: 160 ug via INTRAVENOUS
  Administered 2021-06-10 (×2): 80 ug via INTRAVENOUS
  Administered 2021-06-10: 160 ug via INTRAVENOUS
  Administered 2021-06-10 (×3): 80 ug via INTRAVENOUS

## 2021-06-10 MED ORDER — CHLORHEXIDINE GLUCONATE 0.12 % MT SOLN
OROMUCOSAL | Status: AC
  Administered 2021-06-10: 15 mL via OROMUCOSAL
  Filled 2021-06-10: qty 15

## 2021-06-10 MED ORDER — CEFAZOLIN SODIUM-DEXTROSE 2-4 GM/100ML-% IV SOLN
INTRAVENOUS | Status: AC
  Filled 2021-06-10: qty 100

## 2021-06-10 MED ORDER — ONDANSETRON HCL 4 MG/2ML IJ SOLN
4.0000 mg | Freq: Once | INTRAMUSCULAR | Status: AC | PRN
  Administered 2021-06-10: 4 mg via INTRAVENOUS

## 2021-06-10 MED ORDER — PROPOFOL 10 MG/ML IV BOLUS
INTRAVENOUS | Status: AC
  Filled 2021-06-10: qty 20

## 2021-06-10 MED ORDER — OXYCODONE HCL 5 MG PO TABS
ORAL_TABLET | ORAL | Status: AC
  Filled 2021-06-10: qty 1

## 2021-06-10 MED ORDER — PROPOFOL 500 MG/50ML IV EMUL
INTRAVENOUS | Status: AC
  Filled 2021-06-10: qty 50

## 2021-06-10 MED ORDER — CEFAZOLIN SODIUM-DEXTROSE 2-4 GM/100ML-% IV SOLN
2.0000 g | INTRAVENOUS | Status: AC
  Administered 2021-06-10: 2 g via INTRAVENOUS

## 2021-06-10 MED ORDER — PHENYLEPHRINE HCL-NACL 20-0.9 MG/250ML-% IV SOLN
INTRAVENOUS | Status: DC | PRN
  Administered 2021-06-10: 20 ug/min via INTRAVENOUS

## 2021-06-10 MED ORDER — FENTANYL CITRATE (PF) 100 MCG/2ML IJ SOLN
25.0000 ug | INTRAMUSCULAR | Status: DC | PRN
  Administered 2021-06-10 (×2): 25 ug via INTRAVENOUS

## 2021-06-10 MED ORDER — ROCURONIUM BROMIDE 100 MG/10ML IV SOLN
INTRAVENOUS | Status: DC | PRN
  Administered 2021-06-10: 20 mg via INTRAVENOUS
  Administered 2021-06-10: 50 mg via INTRAVENOUS

## 2021-06-10 MED ORDER — FAMOTIDINE 20 MG PO TABS
ORAL_TABLET | ORAL | Status: AC
  Administered 2021-06-10: 20 mg via ORAL
  Filled 2021-06-10: qty 1

## 2021-06-10 MED ORDER — ACETAMINOPHEN 10 MG/ML IV SOLN
INTRAVENOUS | Status: AC
  Filled 2021-06-10: qty 100

## 2021-06-10 MED ORDER — HYDROCODONE-ACETAMINOPHEN 5-325 MG PO TABS
1.0000 | ORAL_TABLET | ORAL | 0 refills | Status: AC | PRN
Start: 2021-06-10 — End: 2021-06-13

## 2021-06-10 MED ORDER — DEXAMETHASONE SODIUM PHOSPHATE 10 MG/ML IJ SOLN
INTRAMUSCULAR | Status: DC | PRN
Start: 2021-06-10 — End: 2021-06-10
  Administered 2021-06-10: 10 mg via INTRAVENOUS

## 2021-06-10 MED ORDER — ONDANSETRON HCL 4 MG/2ML IJ SOLN
INTRAMUSCULAR | Status: DC | PRN
  Administered 2021-06-10: 4 mg via INTRAVENOUS

## 2021-06-10 MED ORDER — ORAL CARE MOUTH RINSE: 15.0000 mL | Freq: Once | OROMUCOSAL | Status: AC

## 2021-06-10 MED ORDER — SODIUM CHLORIDE 0.9 % IR SOLN
Status: DC | PRN
  Administered 2021-06-10: 500 mL

## 2021-06-10 MED ORDER — BUPIVACAINE-EPINEPHRINE (PF) 0.25% -1:200000 IJ SOLN
INTRAMUSCULAR | Status: AC
  Filled 2021-06-10: qty 30

## 2021-06-10 MED ORDER — CHLORHEXIDINE GLUCONATE 0.12 % MT SOLN: 15.0000 mL | Freq: Once | OROMUCOSAL | Status: AC

## 2021-06-10 MED ORDER — BUPIVACAINE LIPOSOME 1.3 % IJ SUSP
INTRAMUSCULAR | Status: AC
  Filled 2021-06-10: qty 20

## 2021-06-10 MED ORDER — LIDOCAINE HCL (PF) 2 % IJ SOLN
INTRAMUSCULAR | Status: AC
  Filled 2021-06-10: qty 5

## 2021-06-10 MED ORDER — PROPOFOL 10 MG/ML IV BOLUS
INTRAVENOUS | Status: DC | PRN
  Administered 2021-06-10: 200 mg via INTRAVENOUS

## 2021-06-10 MED ORDER — PHENYLEPHRINE HCL-NACL 20-0.9 MG/250ML-% IV SOLN
INTRAVENOUS | Status: AC
  Filled 2021-06-10: qty 250

## 2021-06-10 SURGICAL SUPPLY — 52 items
ADH SKN CLS APL DERMABOND .7 (GAUZE/BANDAGES/DRESSINGS) ×1
APL PRP STRL LF DISP 70% ISPRP (MISCELLANEOUS)
BLADE SURG SZ11 CARB STEEL (BLADE) ×2 IMPLANT
CHLORAPREP W/TINT 26 (MISCELLANEOUS) ×1 IMPLANT
COVER TIP SHEARS 8 DVNC (MISCELLANEOUS) ×1 IMPLANT
COVER TIP SHEARS 8MM DA VINCI (MISCELLANEOUS) ×2
COVER WAND RF STERILE (DRAPES) ×2 IMPLANT
DEFOGGER SCOPE WARMER CLEARIFY (MISCELLANEOUS) ×2 IMPLANT
DERMABOND ADVANCED (GAUZE/BANDAGES/DRESSINGS) ×1
DERMABOND ADVANCED .7 DNX12 (GAUZE/BANDAGES/DRESSINGS) ×1 IMPLANT
DRAPE ARM DVNC X/XI (DISPOSABLE) ×3 IMPLANT
DRAPE COLUMN DVNC XI (DISPOSABLE) ×1 IMPLANT
DRAPE DA VINCI XI ARM (DISPOSABLE) ×6
DRAPE DA VINCI XI COLUMN (DISPOSABLE) ×2
ELECT REM PT RETURN 9FT ADLT (ELECTROSURGICAL) ×2
ELECTRODE REM PT RTRN 9FT ADLT (ELECTROSURGICAL) ×1 IMPLANT
GAUZE 4X4 16PLY ~~LOC~~+RFID DBL (SPONGE) ×2 IMPLANT
GLOVE SURG ENC MOIS LTX SZ6.5 (GLOVE) ×5 IMPLANT
GLOVE SURG UNDER POLY LF SZ6.5 (GLOVE) ×5 IMPLANT
GOWN STRL REUS W/ TWL LRG LVL3 (GOWN DISPOSABLE) ×3 IMPLANT
GOWN STRL REUS W/TWL LRG LVL3 (GOWN DISPOSABLE) ×8
GRASPER SUT TROCAR 14GX15 (MISCELLANEOUS) ×1 IMPLANT
IRRIGATOR SUCT 8 DISP DVNC XI (IRRIGATION / IRRIGATOR) IMPLANT
IRRIGATOR SUCTION 8MM XI DISP (IRRIGATION / IRRIGATOR)
IV NS 1000ML (IV SOLUTION)
IV NS 1000ML BAXH (IV SOLUTION) IMPLANT
KIT PINK PAD W/HEAD ARE REST (MISCELLANEOUS) ×2
KIT PINK PAD W/HEAD ARM REST (MISCELLANEOUS) ×1 IMPLANT
LABEL OR SOLS (LABEL) ×2 IMPLANT
MANIFOLD NEPTUNE II (INSTRUMENTS) ×2 IMPLANT
MESH VENTRALIGHT ST 6X8 (Mesh Specialty) ×2 IMPLANT
MESH VENTRLGHT ELLIPSE 8X6XMFL (Mesh Specialty) IMPLANT
NDL INSUFFLATION 14GA 120MM (NEEDLE) ×1 IMPLANT
NEEDLE HYPO 22GX1.5 SAFETY (NEEDLE) ×2 IMPLANT
NEEDLE INSUFFLATION 14GA 120MM (NEEDLE) ×2 IMPLANT
NS IRRIG 500ML POUR BTL (IV SOLUTION) ×1 IMPLANT
OBTURATOR OPTICAL STANDARD 8MM (TROCAR) ×2
OBTURATOR OPTICAL STND 8 DVNC (TROCAR) ×1
OBTURATOR OPTICALSTD 8 DVNC (TROCAR) ×1 IMPLANT
PACK LAP CHOLECYSTECTOMY (MISCELLANEOUS) ×2 IMPLANT
SEAL CANN UNIV 5-8 DVNC XI (MISCELLANEOUS) ×3 IMPLANT
SEAL XI 5MM-8MM UNIVERSAL (MISCELLANEOUS) ×6
SET TUBE SMOKE EVAC HIGH FLOW (TUBING) ×2 IMPLANT
SOLUTION ELECTROLUBE (MISCELLANEOUS) ×2 IMPLANT
SUT MNCRL AB 4-0 PS2 18 (SUTURE) ×3 IMPLANT
SUT STRATAFIX PDS 30 CT-1 (SUTURE) ×3 IMPLANT
SUT VICRYL 0 27 CT2 27 ABS (SUTURE) ×1 IMPLANT
SUT VLOC 90 2/L VL 12 GS22 (SUTURE) ×5 IMPLANT
TAPE TRANSPORE STRL 2 31045 (GAUZE/BANDAGES/DRESSINGS) ×1 IMPLANT
TRAY FOLEY MTR SLVR 16FR STAT (SET/KITS/TRAYS/PACK) IMPLANT
TROCAR XCEL 12X100 BLDLESS (ENDOMECHANICALS) ×1 IMPLANT
WATER STERILE IRR 500ML POUR (IV SOLUTION) ×1 IMPLANT

## 2021-06-10 NOTE — Anesthesia Preprocedure Evaluation (Addendum)
Anesthesia Evaluation  Patient identified by MRN, date of birth, ID band Patient awake    Reviewed: Allergy & Precautions, NPO status , Patient's Chart, lab work & pertinent test results  History of Anesthesia Complications (+) PONV and history of anesthetic complications  Airway Mallampati: I   Neck ROM: Full    Dental  (+) Upper Dentures   Pulmonary asthma ,    Pulmonary exam normal breath sounds clear to auscultation       Cardiovascular hypertension, Normal cardiovascular exam Rhythm:Regular Rate:Normal  ECG 06/04/21: normal   Neuro/Psych negative neurological ROS     GI/Hepatic negative GI ROS,   Endo/Other  Obesity; prediabetes  Renal/GU negative Renal ROS     Musculoskeletal  (+) Arthritis , Skin CA   Abdominal   Peds  Hematology negative hematology ROS (+)   Anesthesia Other Findings   Reproductive/Obstetrics                            Anesthesia Physical Anesthesia Plan  ASA: 2  Anesthesia Plan: General   Post-op Pain Management:    Induction: Intravenous  PONV Risk Score and Plan: 3 and Ondansetron, Dexamethasone and Treatment may vary due to age or medical condition  Airway Management Planned: Oral ETT  Additional Equipment:   Intra-op Plan:   Post-operative Plan: Extubation in OR  Informed Consent: I have reviewed the patients History and Physical, chart, labs and discussed the procedure including the risks, benefits and alternatives for the proposed anesthesia with the patient or authorized representative who has indicated his/her understanding and acceptance.     Dental advisory given  Plan Discussed with: CRNA  Anesthesia Plan Comments: (Patient consented for risks of anesthesia including but not limited to:  - adverse reactions to medications - damage to eyes, teeth, lips or other oral mucosa - nerve damage due to positioning  - sore throat or  hoarseness - damage to heart, brain, nerves, lungs, other parts of body or loss of life  Informed patient about role of CRNA in peri- and intra-operative care.  Patient voiced understanding.)        Anesthesia Quick Evaluation

## 2021-06-10 NOTE — Anesthesia Procedure Notes (Signed)
Procedure Name: Intubation Date/Time: 06/10/2021 8:42 AM Performed by: Debe Coder, CRNA Pre-anesthesia Checklist: Patient identified, Emergency Drugs available, Suction available and Patient being monitored Patient Re-evaluated:Patient Re-evaluated prior to induction Oxygen Delivery Method: Circle system utilized Preoxygenation: Pre-oxygenation with 100% oxygen Induction Type: IV induction Ventilation: Mask ventilation without difficulty Laryngoscope Size: Mac and 3 Grade View: Grade I Tube type: Oral Tube size: 6.5 mm Number of attempts: 1 Airway Equipment and Method: Stylet and Oral airway Placement Confirmation: ETT inserted through vocal cords under direct vision, positive ETCO2 and breath sounds checked- equal and bilateral Secured at: 21 cm Tube secured with: Tape Dental Injury: Teeth and Oropharynx as per pre-operative assessment

## 2021-06-10 NOTE — Discharge Instructions (Addendum)
  Diet: Resume home heart healthy regular diet.   Activity: No heavy lifting >20 pounds (children, pets, laundry, garbage) or strenuous activity until follow-up, but light activity and walking are encouraged. Do not drive or drink alcohol if taking narcotic pain medications.  Wound care: May shower with soapy water and pat dry (do not rub incisions), but no baths or submerging incision underwater until follow-up. (no swimming)   Medications: Resume all home medications. For mild to moderate pain: acetaminophen (Tylenol) ***or ibuprofen (if no kidney disease). Combining Tylenol with alcohol can substantially increase your risk of causing liver disease. Narcotic pain medications, if prescribed, can be used for severe pain, though may cause nausea, constipation, and drowsiness. Do not combine Tylenol and Norco within a 6 hour period as Norco contains Tylenol. If you do not need the narcotic pain medication, you do not need to fill the prescription.  Call office ([phone]) at any time if any questions, worsening pain, fevers/chills, bleeding, drainage from incision site, or other concerns.   AMBULATORY SURGERY  DISCHARGE INSTRUCTIONS   The drugs that you were given will stay in your system until tomorrow so for the next 24 hours you should not:  Drive an automobile Make any legal decisions Drink any alcoholic beverage   You may resume regular meals tomorrow.  Today it is better to start with liquids and gradually work up to solid foods.  You may eat anything you prefer, but it is better to start with liquids, then soup and crackers, and gradually work up to solid foods.   Please notify your doctor immediately if you have any unusual bleeding, trouble breathing, redness and pain at the surgery site, drainage, fever, or pain not relieved by medication.    Additional Instructions:        Please contact your physician with any problems or Same Day Surgery at [phone], Monday  through Friday 6 am to 4 pm, or [place] [facility] [place] Main number at [phone].  

## 2021-06-10 NOTE — Op Note (Addendum)
Preoperative diagnosis: Ventral Hernia  Postoperative diagnosis: Incisional and Ventral Hernia  Procedure: Robotic assisted laparoscopic incisional and ventral hernia repair with mesh  Anesthesia: General  Surgeon: Dr. Windell Moment  Wound Classification: Clean  Specimen: None  Complications: None  Estimated Blood Loss: 42ml  Indications: Patient is a 80 y.o. female developed a ventral hernia. This was symptomatic and incarcerated and repair was indicated.   Pictures: Incarcerated ventral and incisional hernia Reduced incisional hernia and incarcerated ventral hernia Reducing ventral hernia         Findings: A 3 x 4 cm incisional infraumbilical hernia and a 2 cm x 2 cm ventral hernia 2. Repair achieved with closure of the anterior fascia at midline and 20 x 15 cm Ventralight Bard mesh 3. Adequate hemostasis  Description of procedure: The patient was brought to the operating room and general anesthesia was induced. A time-out was completed verifying correct patient, procedure, site, positioning, and implant(s) and/or special equipment prior to beginning this procedure. Antibiotics were administered prior to making the incision. SCDs placed. The anterior abdominal wall was prepped and draped in the standard sterile fashion.   Palmer's point chosen for entry.  Veress needle placed and abdomen insufflated to 15cm without any dramatic increase in pressure.  Needle removed and optiview technique used to place 67mm port at same point.  No injury noted during placement. Exparel was infused in a TAP block. Two additional ports, 50mm x2 along left lateral aspect placed.  Xi robot then docked into place.  Hernia contents noted and reduced with combination of blunt, sharp dissection with scissors and fenestrated forceps.  Hemostasis achieved throughout this portion.  Once all hernia contents reduced, there was noted to be two hernias. One infraumbilical incisional hernia and one ventral  hernia as described above hernia.    Insufflation dropped to 74mm and transfacial suture with two 0 stratafix used to primarily close the defects under minimal tension. Bard Ventralight protected 20 x 15 cm mesh was placed within the abdominal cavity through 51mm port and secured to the abdominal wall centered over the defect using the 0 stratafix previously used to primarily close defect.  The mesh was then circumferentially sutured into the anterior abdominal wall using 2-0 VLock x3.  Any bleeding noted during this portion was no longer actively bleeding by end of securing mesh and tightening the suture.    Robot was undocked.  The 57mm cannula was removed and port site was closed using PMI device and 0 vicryl suture, ensuring no bowels were injured during this process.  Abdomen then desufflated while camera within abdomen to ensure no signs of new bleed prior to removing camera and rest of ports completely.  All skin incisions closed with runninrg 4-0 Monocryl in a subcuticular fashion.  All wounds then dressed with Dermabond.  Patient was then successfully awakened and transferred to PACU in stable condition.  At the end of the procedure sponge and instrument counts were correct.

## 2021-06-10 NOTE — Anesthesia Postprocedure Evaluation (Signed)
Anesthesia Post Note  Patient: Cheryl Joseph  Procedure(s) Performed: XI ROBOTIC ASSISTED VENTRAL HERNIA (Abdomen)  Patient location during evaluation: PACU Anesthesia Type: General Level of consciousness: awake and alert, oriented and patient cooperative Pain management: pain level controlled Vital Signs Assessment: post-procedure vital signs reviewed and stable Respiratory status: spontaneous breathing, nonlabored ventilation and respiratory function stable Cardiovascular status: blood pressure returned to baseline and stable Postop Assessment: adequate PO intake Anesthetic complications: no   No notable events documented.   Last Vitals:  Vitals:   06/10/21 1130 06/10/21 1135  BP: 126/73   Pulse: 77 80  Resp: 15 15  Temp:    SpO2: 98% (!) 87%    Last Pain:  Vitals:   06/10/21 1130  TempSrc:   PainSc: [place]

## 2021-06-10 NOTE — Interval H&P Note (Signed)
History and Physical Interval Note:  06/10/2021 7:04 AM  Cheryl Joseph  has presented today for surgery, with the diagnosis of K43.9 Ventral hernia w/o obstruction or gangrene.  The various methods of treatment have been discussed with the patient and family. After consideration of risks, benefits and other options for treatment, the patient has consented to  Procedure(s): XI ROBOTIC [place] (N/A) as a surgical intervention.  The patient's history has been reviewed, patient examined, no change in status, stable for surgery.  I have reviewed the patient's chart and labs.  Questions were answered to the patient's satisfaction.     Herbert Pun

## 2021-06-10 NOTE — Transfer of Care (Signed)
Immediate Anesthesia Transfer of Care Note  Patient: Cheryl Joseph  Procedure(s) Performed: XI ROBOTIC ASSISTED VENTRAL HERNIA (Abdomen)  Patient Location: PACU  Anesthesia Type:General  Level of Consciousness: awake, alert  and oriented  Airway & Oxygen Therapy: Patient Spontanous Breathing, Patient connected to nasal cannula oxygen and Patient connected to face mask oxygen  Post-op Assessment: Report given to RN and Post -op Vital signs reviewed and stable  Post vital signs: Reviewed and stable  Last Vitals:  Vitals Value Taken Time  BP 121/65 06/10/21 1103  Temp    Pulse 64 06/10/21 1108  Resp 11 06/10/21 1108  SpO2 79 % 06/10/21 1108  Vitals shown include unvalidated device data.  Last Pain:  Vitals:   06/10/21 0625  TempSrc: Temporal  PainSc: 0-No pain         Complications: No notable events documented.

## 2021-06-17 ENCOUNTER — Other Ambulatory Visit
Admission: RE | Admit: 2021-06-17 | Discharge: 2021-06-17 | Disposition: A | Discharge status: Home / Self Care | Payer: Medicare HMO | Source: Ambulatory Visit | Attending: General Surgery | Admitting: General Surgery

## 2021-06-17 DIAGNOSIS — R197 Diarrhea, unspecified: Secondary | ICD-10-CM | POA: Diagnosis not present

## 2021-06-17 LAB — C DIFFICILE QUICK SCREEN W PCR REFLEX
C Diff antigen: NEGATIVE
C Diff interpretation: NOT DETECTED
C Diff toxin: NEGATIVE

## 2021-06-24 DIAGNOSIS — E785 Hyperlipidemia, unspecified: Secondary | ICD-10-CM | POA: Diagnosis not present

## 2021-06-24 DIAGNOSIS — I10 Essential (primary) hypertension: Secondary | ICD-10-CM

## 2021-07-02 ENCOUNTER — Ambulatory Visit: Payer: Medicare HMO

## 2021-07-02 DIAGNOSIS — E785 Hyperlipidemia, unspecified: Secondary | ICD-10-CM

## 2021-07-02 DIAGNOSIS — K5730 Diverticulosis of large intestine without perforation or abscess without bleeding: Secondary | ICD-10-CM

## 2021-07-02 DIAGNOSIS — I10 Essential (primary) hypertension: Secondary | ICD-10-CM

## 2021-07-02 NOTE — Patient Instructions (Signed)
Visit Information  Thank you for taking time to visit with me today. Please don't hesitate to contact me if I can be of assistance to you before our next scheduled telephone appointment.  Patient Goals/Self-Care Activities: Patient will self administer medications as prescribed as evidenced by self report/primary caregiver report  Patient will attend all scheduled provider appointments as evidenced by clinician review of documented attendance to scheduled appointments and patient/caregiver report Patient will call pharmacy for medication refills as evidenced by patient report and review of pharmacy fill history as appropriate Patient will call provider office for new concerns or questions as evidenced by review of documented incoming telephone call notes and patient report - check blood pressure weekly - take blood pressure log to all doctor appointments - call doctor for signs and symptoms of high blood pressure - keep all doctor appointments - take medications for blood pressure exactly as prescribed - report new symptoms to your doctor - eat more whole grains, fruits and vegetables, lean meats and healthy fats - Allow yourself some grace and rest when needed while healing from your surgery Continue to follow a low salt diet.   Our next appointment is by telephone on September 17, 2021 at 11:00 am  Please call the care guide team at [phone] if you need to cancel or reschedule your appointment.   If you are experiencing a Mental Health or [place] or need someone to talk to, please call the Suicide and Crisis Lifeline: 988 call the [place] National Suicide Prevention Lifeline: 408-218-5712 or TTY: 803-212-4774 TTY 336-517-0763) to talk to a trained counselor call 1-800-273-TALK (toll free, 24 hour hotline)   Patient verbalizes understanding of instructions provided today and agrees to view in [place].   Quinn Plowman RN,BSN,CCM RN Case Manager [place]   [phone]

## 2021-07-02 NOTE — Chronic Care Management (AMB) (Signed)
Chronic Care Management   CCM RN Visit Note  07/02/2021 Name: Cheryl Joseph MRN: [id number] DOB: 1941/03/24  Subjective: Cheryl Joseph is a 80 y.o. year old female who is a primary care patient of Tower, Wynelle Fanny, MD. The care management team was consulted for assistance with disease management and care coordination needs.    Engaged with patient by telephone for follow up visit in response to provider referral for case management and/or care coordination services.   Consent to Services:  The patient was given information about Chronic Care Management services, agreed to services, and gave verbal consent prior to initiation of services.  Please see initial visit note for detailed documentation.   Patient agreed to services and verbal consent obtained.   Assessment: Review of patient past medical history, allergies, medications, health status, including review of consultants reports, laboratory and other test data, was performed as part of comprehensive evaluation and provision of chronic care management services.   SDOH (Social Determinants of Health) assessments and interventions performed:    CCM Care Plan  Allergies  Allergen Reactions   Tolterodine Tartrate Other (See Comments)    Unknown (back ache)   Vesicare [Solifenacin Succinate]     Eye problems     Outpatient Encounter Medications as of 07/02/2021  Medication Sig   acetaminophen (TYLENOL) 500 MG tablet Take 1,000 mg by mouth every 6 (six) hours as needed for mild pain.   albuterol (VENTOLIN HFA) 108 (90 Base) MCG/ACT inhaler INHALE TWO PUFFS BY MOUTH EVERY 4 HOURS AS NEEDED FOR WHEEZING AND 2 PUFFS BEFORE EXERCISE OR EXPOSURE TO COLD AIR   amLODipine (NORVASC) 5 MG tablet Take 1 tablet (5 mg total) by mouth daily. (Patient taking differently: Take 5 mg by mouth at bedtime.)   b complex vitamins tablet Take 1 tablet by mouth every Monday, Wednesday, and Friday.   calcium carbonate (OS-CAL - DOSED IN MG OF ELEMENTAL  CALCIUM) 1250 (500 Ca) MG tablet Take 2 tablets by mouth daily with breakfast.   Cholecalciferol (VITAMIN D3 PO) Take 2 tablets by mouth daily.   Hyaluronic Acid-Vitamin C (HYALURONIC ACID PO) Take 1 tablet by mouth every Monday, Wednesday, and Friday.   montelukast (SINGULAIR) 10 MG tablet Take 1 tablet (10 mg total) by mouth at bedtime.   Multiple Vitamin (MULTIVITAMIN) capsule Take 1 capsule by mouth daily. Reported on 12/18/2015   Omega-3 Fatty Acids (FISH OIL PO) Take 1 capsule by mouth daily. Reported on 12/18/2015   OVER THE COUNTER MEDICATION Take 2 capsules by mouth daily. BONE UP   oxybutynin (OXYTROL) 3.9 MG/24HR Place 1 patch onto the skin every 3 (three) days. (Patient taking differently: Place 1 patch onto the skin 2 (two) times a week. Wed and Sun)   Probiotic Product (PROBIOTIC DAILY PO) Take 1 tablet by mouth daily.   psyllium (METAMUCIL SMOOTH TEXTURE) 28 % packet Take 1 packet by mouth daily.   Pumpkin Seed-Soy Germ (AZO BLADDER CONTROL/GO-LESS PO) Take 1 capsule by mouth at bedtime.   simvastatin (ZOCOR) 40 MG tablet TAKE 1 TABLET BY MOUTH AT BEDTIME   TURMERIC PO Take 720 mg by mouth every Monday, Wednesday, and Friday.   Ubiquinol 100 MG CAPS Take 1 capsule by mouth every Monday, Wednesday, and Friday.   No facility-administered encounter medications on file as of 07/02/2021.    Patient Active Problem List   Diagnosis Date Noted   Umbilical hernia 67/06/4579   Frequent stools 01/04/2020   Pre-operative general physical examination 07/06/2019  Chronic Care Management   CCM RN Visit Note  07/02/2021 Name: Cheryl Joseph MRN: [id number] DOB: 1941/03/24  Subjective: Cheryl Joseph is a 80 y.o. year old female who is a primary care patient of Tower, Wynelle Fanny, MD. The care management team was consulted for assistance with disease management and care coordination needs.    Engaged with patient by telephone for follow up visit in response to provider referral for case management and/or care coordination services.   Consent to Services:  The patient was given information about Chronic Care Management services, agreed to services, and gave verbal consent prior to initiation of services.  Please see initial visit note for detailed documentation.   Patient agreed to services and verbal consent obtained.   Assessment: Review of patient past medical history, allergies, medications, health status, including review of consultants reports, laboratory and other test data, was performed as part of comprehensive evaluation and provision of chronic care management services.   SDOH (Social Determinants of Health) assessments and interventions performed:    CCM Care Plan  Allergies  Allergen Reactions   Tolterodine Tartrate Other (See Comments)    Unknown (back ache)   Vesicare [Solifenacin Succinate]     Eye problems     Outpatient Encounter Medications as of 07/02/2021  Medication Sig   acetaminophen (TYLENOL) 500 MG tablet Take 1,000 mg by mouth every 6 (six) hours as needed for mild pain.   albuterol (VENTOLIN HFA) 108 (90 Base) MCG/ACT inhaler INHALE TWO PUFFS BY MOUTH EVERY 4 HOURS AS NEEDED FOR WHEEZING AND 2 PUFFS BEFORE EXERCISE OR EXPOSURE TO COLD AIR   amLODipine (NORVASC) 5 MG tablet Take 1 tablet (5 mg total) by mouth daily. (Patient taking differently: Take 5 mg by mouth at bedtime.)   b complex vitamins tablet Take 1 tablet by mouth every Monday, Wednesday, and Friday.   calcium carbonate (OS-CAL - DOSED IN MG OF ELEMENTAL  CALCIUM) 1250 (500 Ca) MG tablet Take 2 tablets by mouth daily with breakfast.   Cholecalciferol (VITAMIN D3 PO) Take 2 tablets by mouth daily.   Hyaluronic Acid-Vitamin C (HYALURONIC ACID PO) Take 1 tablet by mouth every Monday, Wednesday, and Friday.   montelukast (SINGULAIR) 10 MG tablet Take 1 tablet (10 mg total) by mouth at bedtime.   Multiple Vitamin (MULTIVITAMIN) capsule Take 1 capsule by mouth daily. Reported on 12/18/2015   Omega-3 Fatty Acids (FISH OIL PO) Take 1 capsule by mouth daily. Reported on 12/18/2015   OVER THE COUNTER MEDICATION Take 2 capsules by mouth daily. BONE UP   oxybutynin (OXYTROL) 3.9 MG/24HR Place 1 patch onto the skin every 3 (three) days. (Patient taking differently: Place 1 patch onto the skin 2 (two) times a week. Wed and Sun)   Probiotic Product (PROBIOTIC DAILY PO) Take 1 tablet by mouth daily.   psyllium (METAMUCIL SMOOTH TEXTURE) 28 % packet Take 1 packet by mouth daily.   Pumpkin Seed-Soy Germ (AZO BLADDER CONTROL/GO-LESS PO) Take 1 capsule by mouth at bedtime.   simvastatin (ZOCOR) 40 MG tablet TAKE 1 TABLET BY MOUTH AT BEDTIME   TURMERIC PO Take 720 mg by mouth every Monday, Wednesday, and Friday.   Ubiquinol 100 MG CAPS Take 1 capsule by mouth every Monday, Wednesday, and Friday.   No facility-administered encounter medications on file as of 07/02/2021.    Patient Active Problem List   Diagnosis Date Noted   Umbilical hernia 67/06/4579   Frequent stools 01/04/2020   Pre-operative general physical examination 07/06/2019  Chronic Care Management   CCM RN Visit Note  07/02/2021 Name: Cheryl Joseph MRN: [id number] DOB: 1941/03/24  Subjective: Cheryl Joseph is a 80 y.o. year old female who is a primary care patient of Tower, Wynelle Fanny, MD. The care management team was consulted for assistance with disease management and care coordination needs.    Engaged with patient by telephone for follow up visit in response to provider referral for case management and/or care coordination services.   Consent to Services:  The patient was given information about Chronic Care Management services, agreed to services, and gave verbal consent prior to initiation of services.  Please see initial visit note for detailed documentation.   Patient agreed to services and verbal consent obtained.   Assessment: Review of patient past medical history, allergies, medications, health status, including review of consultants reports, laboratory and other test data, was performed as part of comprehensive evaluation and provision of chronic care management services.   SDOH (Social Determinants of Health) assessments and interventions performed:    CCM Care Plan  Allergies  Allergen Reactions   Tolterodine Tartrate Other (See Comments)    Unknown (back ache)   Vesicare [Solifenacin Succinate]     Eye problems     Outpatient Encounter Medications as of 07/02/2021  Medication Sig   acetaminophen (TYLENOL) 500 MG tablet Take 1,000 mg by mouth every 6 (six) hours as needed for mild pain.   albuterol (VENTOLIN HFA) 108 (90 Base) MCG/ACT inhaler INHALE TWO PUFFS BY MOUTH EVERY 4 HOURS AS NEEDED FOR WHEEZING AND 2 PUFFS BEFORE EXERCISE OR EXPOSURE TO COLD AIR   amLODipine (NORVASC) 5 MG tablet Take 1 tablet (5 mg total) by mouth daily. (Patient taking differently: Take 5 mg by mouth at bedtime.)   b complex vitamins tablet Take 1 tablet by mouth every Monday, Wednesday, and Friday.   calcium carbonate (OS-CAL - DOSED IN MG OF ELEMENTAL  CALCIUM) 1250 (500 Ca) MG tablet Take 2 tablets by mouth daily with breakfast.   Cholecalciferol (VITAMIN D3 PO) Take 2 tablets by mouth daily.   Hyaluronic Acid-Vitamin C (HYALURONIC ACID PO) Take 1 tablet by mouth every Monday, Wednesday, and Friday.   montelukast (SINGULAIR) 10 MG tablet Take 1 tablet (10 mg total) by mouth at bedtime.   Multiple Vitamin (MULTIVITAMIN) capsule Take 1 capsule by mouth daily. Reported on 12/18/2015   Omega-3 Fatty Acids (FISH OIL PO) Take 1 capsule by mouth daily. Reported on 12/18/2015   OVER THE COUNTER MEDICATION Take 2 capsules by mouth daily. BONE UP   oxybutynin (OXYTROL) 3.9 MG/24HR Place 1 patch onto the skin every 3 (three) days. (Patient taking differently: Place 1 patch onto the skin 2 (two) times a week. Wed and Sun)   Probiotic Product (PROBIOTIC DAILY PO) Take 1 tablet by mouth daily.   psyllium (METAMUCIL SMOOTH TEXTURE) 28 % packet Take 1 packet by mouth daily.   Pumpkin Seed-Soy Germ (AZO BLADDER CONTROL/GO-LESS PO) Take 1 capsule by mouth at bedtime.   simvastatin (ZOCOR) 40 MG tablet TAKE 1 TABLET BY MOUTH AT BEDTIME   TURMERIC PO Take 720 mg by mouth every Monday, Wednesday, and Friday.   Ubiquinol 100 MG CAPS Take 1 capsule by mouth every Monday, Wednesday, and Friday.   No facility-administered encounter medications on file as of 07/02/2021.    Patient Active Problem List   Diagnosis Date Noted   Umbilical hernia 67/06/4579   Frequent stools 01/04/2020   Pre-operative general physical examination 07/06/2019

## 2021-07-08 ENCOUNTER — Encounter: Payer: Self-pay | Admitting: General Surgery

## 2021-07-09 ENCOUNTER — Telehealth: Payer: Medicare HMO

## 2021-09-17 ENCOUNTER — Ambulatory Visit (INDEPENDENT_AMBULATORY_CARE_PROVIDER_SITE_OTHER): Payer: Medicare HMO

## 2021-09-17 DIAGNOSIS — I10 Essential (primary) hypertension: Secondary | ICD-10-CM

## 2021-09-17 DIAGNOSIS — E785 Hyperlipidemia, unspecified: Secondary | ICD-10-CM

## 2021-09-17 NOTE — Patient Instructions (Signed)
Visit Information  Thank you for taking time to visit with me today. Please don't hesitate to contact me if I can be of assistance to you before our next scheduled telephone appointment.  Following are the goals we discussed today:  Patient will self administer medications as prescribed as evidenced by self report/primary caregiver report  Patient will attend all scheduled provider appointments as evidenced by clinician review of documented attendance to scheduled appointments and patient/caregiver report Patient will call pharmacy for medication refills as evidenced by patient report and review of pharmacy fill history as appropriate Patient will call provider office for new concerns or questions  - check blood pressure weekly - take blood pressure log to all doctor appointments - call doctor for signs and symptoms of high blood pressure - keep all doctor appointments - take medications for blood pressure exactly as prescribed - Call your surgeon's office to report pain and knot at hernia surgical site.  - Continue to eat more whole grains, fruits and vegetables, lean meats and healthy fats Continue to follow a low salt diet.   Our next appointment is by telephone on 11/12/21 at 9:30 am  Please call the care guide team at [phone] if you need to cancel or reschedule your appointment.   If you are experiencing a Mental Health or [place] or need someone to talk to, please call the Suicide and Crisis Lifeline: 988 call 1-800-273-TALK (toll free, 24 hour hotline)   Patient verbalizes understanding of instructions and care plan provided today and agrees to view in [place]. Active MyChart status confirmed with patient.    Quinn Plowman RN,BSN,CCM RN Case Manager [place]  [phone]

## 2021-09-17 NOTE — Chronic Care Management (AMB) (Signed)
Chronic Care Management   CCM RN Visit Note  09/17/2021 Name: Cheryl Joseph MRN: [id number] DOB: February 29, 1941  Subjective: Cheryl Joseph is a 81 y.o. year old female who is a primary care patient of Tower, Wynelle Fanny, MD. The care management team was consulted for assistance with disease management and care coordination needs.    Engaged with patient by telephone for follow up visit in response to provider referral for case management and/or care coordination services.   Consent to Services:  The patient was given information about Chronic Care Management services, agreed to services, and gave verbal consent prior to initiation of services.  Please see initial visit note for detailed documentation.   Patient agreed to services and verbal consent obtained.   Assessment: Review of patient past medical history, allergies, medications, health status, including review of consultants reports, laboratory and other test data, was performed as part of comprehensive evaluation and provision of chronic care management services.   SDOH (Social Determinants of Health) assessments and interventions performed:    CCM Care Plan  Allergies  Allergen Reactions   Tolterodine Tartrate Other (See Comments)    Unknown (back ache)   Vesicare [Solifenacin Succinate]     Eye problems     Outpatient Encounter Medications as of 09/17/2021  Medication Sig   albuterol (VENTOLIN HFA) 108 (90 Base) MCG/ACT inhaler INHALE TWO PUFFS BY MOUTH EVERY 4 HOURS AS NEEDED FOR WHEEZING AND 2 PUFFS BEFORE EXERCISE OR EXPOSURE TO COLD AIR   amLODipine (NORVASC) 5 MG tablet Take 1 tablet (5 mg total) by mouth daily. (Patient taking differently: Take 5 mg by mouth at bedtime.)   b complex vitamins tablet Take 1 tablet by mouth every Monday, Wednesday, and Friday.   calcium carbonate (OS-CAL - DOSED IN MG OF ELEMENTAL CALCIUM) 1250 (500 Ca) MG tablet Take 2 tablets by mouth daily with breakfast.   Cholecalciferol (VITAMIN  D3 PO) Take 2 tablets by mouth daily.   Hyaluronic Acid-Vitamin C (HYALURONIC ACID PO) Take 1 tablet by mouth every Monday, Wednesday, and Friday.   montelukast (SINGULAIR) 10 MG tablet Take 1 tablet (10 mg total) by mouth at bedtime.   Multiple Vitamin (MULTIVITAMIN) capsule Take 1 capsule by mouth daily. Reported on 12/18/2015   Omega-3 Fatty Acids (FISH OIL PO) Take 1 capsule by mouth daily. Reported on 12/18/2015   oxybutynin (OXYTROL) 3.9 MG/24HR Place 1 patch onto the skin every 3 (three) days. (Patient taking differently: Place 1 patch onto the skin 2 (two) times a week. Wed and Sun)   Probiotic Product (PROBIOTIC DAILY PO) Take 1 tablet by mouth daily.   psyllium (METAMUCIL SMOOTH TEXTURE) 28 % packet Take 1 packet by mouth daily.   Pumpkin Seed-Soy Germ (AZO BLADDER CONTROL/GO-LESS PO) Take 1 capsule by mouth at bedtime.   simvastatin (ZOCOR) 40 MG tablet TAKE 1 TABLET BY MOUTH AT BEDTIME   TURMERIC PO Take 720 mg by mouth every Monday, Wednesday, and Friday.   Ubiquinol 100 MG CAPS Take 1 capsule by mouth every Monday, Wednesday, and Friday.   acetaminophen (TYLENOL) 500 MG tablet Take 1,000 mg by mouth every 6 (six) hours as needed for mild pain.   OVER THE COUNTER MEDICATION Take 2 capsules by mouth daily. BONE UP   No facility-administered encounter medications on file as of 09/17/2021.    Patient Active Problem List   Diagnosis Date Noted   Umbilical hernia 75/43/6067   Frequent stools 01/04/2020   Pre-operative general physical examination 07/06/2019  Lower abdominal pain 08/16/2018   Stress reaction 09/05/2017   Estrogen deficiency 05/25/2016   Routine general medical examination at a health care facility 11/23/2015   [place]amm[place]oe 10/28/2015   Frequent loose stools 02/14/2015   Diverticulosis of colon without hemorrhage 02/14/2015   Colon cancer screening 08/19/2014   Essential hypertension 02/25/2014   Encounter for Medicare annual wellness exam 07/16/2013   Prediabetes  05/14/2009   Rheumatoid arthritis with positive rheumatoid factor ([place]) 05/13/2008   Osteopenia 03/07/2007   Hyperlipidemia 01/03/2007   Obesity (BMI 30-39.9) 01/03/2007   ALLERGIC RHINITIS, SEASONAL 01/03/2007   ASTHMA 01/03/2007   OVERACTIVE BLADDER 01/03/2007    Conditions to be addressed/monitored:HTN and HLD  Care Plan : [facility] plan of care  Updates made by Dannielle Karvonen, RN since 09/17/2021 12:00 AM     Problem: Chronic disease management education and care coordination needs ( Hypertension, Hyperlipidemia)   Priority: High     Long-Range Goal: Development of plan of care to address chronic disease management and care coordination needs ( Hypertension, hyperlipidemia)   Start Date: 06/08/2021  Expected End Date: 11/20/2021  Priority: High  Note:   Current Barriers:  Knowledge Deficits related to plan of care for management of HTN and HLD  Chronic Disease Management support and education needs related to HTN and HLD Patient states she continues to monitor blood pressure. Blood pressure readings reported:  149/56, 131/72, 141/65, 145/59, 127/73.   Patient states she has her annual wellness visit scheduled for 11/02/2021.  Patient states she still has some discomfort/ pain at her hernia surgical site and notes a knot at the site when she lays down.  RNCM advised patient to call the surgeon's office to report these symptoms.   Patient verbalized understanding and agreement.  RNCM Clinical Goal(s):  Patient will verbalize understanding of plan for management of HTN and HLD through collaboration with RN case manager, provider and care team.  take all medications exactly as prescribed and will call provider for medication related questions attend all scheduled medical appointments continue to work with RN Care Manager to address care management and care coordination needs related to  HTN and HLD through collaboration with RN Care manager, provider, and care team.   Interventions: 1:1  collaboration with primary care provider regarding development and update of comprehensive plan of care as evidenced by provider attestation and co-signature Inter-disciplinary care team collaboration (see longitudinal plan of care) Evaluation of current treatment plan related to  self management and patient's adherence to plan as established by provider  Hypertension Interventions:  On Track Last practice recorded BP readings:  BP Readings from Last 3 Encounters:  06/10/21 131/76  05/23/21 (!) 152/69  01/05/21 136/74  Most recent eGFR/CrCl: No results found for: EGFR  No components found for: CRCL  Evaluation of current treatment plan related to hypertension self management and patient's adherence to plan as established by provider Reviewed medications with patient and discussed importance of compliance Discussed plans with patient for ongoing care management follow up and provided patient with direct contact information for care management team Advised patient, providing education and rationale, to monitor blood pressure daily and record, calling PCP for findings outside established parameters Reviewed scheduled / upcoming provider appointments Advised to follow low salt diet.   Hyperlipidemia Interventions:  On Track Medication review performed; medication list updated in electronic medical record.  Provider established cholesterol goals reviewed; Counseled on importance of regular laboratory monitoring as recommended Reviewed importance of limiting foods high in cholesterol; Reviewed  exercise goals and target of 150 minutes per week;  Advised to follow heart healthy diet.    Patient Goals/Self-Care Activities: Patient will self administer medications as prescribed as evidenced by self report/primary caregiver report  Patient will attend all scheduled provider appointments as evidenced by clinician review of documented attendance to scheduled appointments and patient/caregiver  report Patient will call pharmacy for medication refills as evidenced by patient report and review of pharmacy fill history as appropriate Patient will call provider office for new concerns or questions  - check blood pressure weekly - take blood pressure log to all doctor appointments - call doctor for signs and symptoms of high blood pressure - keep all doctor appointments - take medications for blood pressure exactly as prescribed - Call your surgeon's office to report pain and knot at hernia surgical site.  - Continue to eat more whole grains, fruits and vegetables, lean meats and healthy fats Continue to follow a low salt diet.            Plan:The patient has been provided with contact information for the care management team and has been advised to call with any health related questions or concerns.  The care management team will reach out to the patient again over the next 2 months . Quinn Plowman RN,BSN,CCM RN Case Manager [place]  [phone]

## 2021-09-22 DIAGNOSIS — I10 Essential (primary) hypertension: Secondary | ICD-10-CM

## 2021-09-22 DIAGNOSIS — E785 Hyperlipidemia, unspecified: Secondary | ICD-10-CM | POA: Diagnosis not present

## 2021-09-22 DIAGNOSIS — L7634 Postprocedural seroma of skin and subcutaneous tissue following other procedure: Secondary | ICD-10-CM | POA: Diagnosis not present

## 2021-10-02 ENCOUNTER — Encounter: Payer: Self-pay | Admitting: General Surgery

## 2021-10-13 ENCOUNTER — Telehealth: Payer: Self-pay | Admitting: Family Medicine

## 2021-10-13 DIAGNOSIS — R7303 Prediabetes: Secondary | ICD-10-CM

## 2021-10-13 DIAGNOSIS — I10 Essential (primary) hypertension: Secondary | ICD-10-CM

## 2021-10-13 DIAGNOSIS — E785 Hyperlipidemia, unspecified: Secondary | ICD-10-CM

## 2021-10-13 NOTE — Telephone Encounter (Signed)
-----   Message from Velna Hatchet, RT sent at 10/13/2021  4:43 PM EDT ----- ?Regarding: Lab order for Wednesday, 10/21/21 ?Patient is scheduled for cpx, please order future labs.  Thanks, Anda Kraft  ? ?

## 2021-10-21 ENCOUNTER — Other Ambulatory Visit: Payer: Self-pay

## 2021-10-21 ENCOUNTER — Other Ambulatory Visit (INDEPENDENT_AMBULATORY_CARE_PROVIDER_SITE_OTHER): Payer: Medicare HMO

## 2021-10-21 ENCOUNTER — Ambulatory Visit (INDEPENDENT_AMBULATORY_CARE_PROVIDER_SITE_OTHER): Payer: Medicare HMO

## 2021-10-21 VITALS — Ht 63.0 in | Wt 205.0 lb

## 2021-10-21 DIAGNOSIS — R7303 Prediabetes: Secondary | ICD-10-CM

## 2021-10-21 DIAGNOSIS — E785 Hyperlipidemia, unspecified: Secondary | ICD-10-CM | POA: Diagnosis not present

## 2021-10-21 DIAGNOSIS — Z0000 Encounter for general adult medical examination without abnormal findings: Secondary | ICD-10-CM

## 2021-10-21 DIAGNOSIS — I10 Essential (primary) hypertension: Secondary | ICD-10-CM

## 2021-10-21 LAB — CBC WITH DIFFERENTIAL/PLATELET
Basophils Absolute: 0 10*3/uL (ref 0.0–0.1)
Basophils Relative: 0.9 % (ref 0.0–3.0)
Eosinophils Absolute: 0.2 10*3/uL (ref 0.0–0.7)
Eosinophils Relative: 3.3 % (ref 0.0–5.0)
HCT: 44.7 % (ref 36.0–46.0)
Hemoglobin: 14.8 g/dL (ref 12.0–15.0)
Lymphocytes Relative: 34.4 % (ref 12.0–46.0)
Lymphs Abs: 1.8 10*3/uL (ref 0.7–4.0)
MCHC: 33.2 g/dL (ref 30.0–36.0)
MCV: 94.1 fl (ref 78.0–100.0)
Monocytes Absolute: 0.5 10*3/uL (ref 0.1–1.0)
Monocytes Relative: 9.3 % (ref 3.0–12.0)
Neutro Abs: 2.7 10*3/uL (ref 1.4–7.7)
Neutrophils Relative %: 52.1 % (ref 43.0–77.0)
Platelets: 221 10*3/uL (ref 150.0–400.0)
RBC: 4.75 Mil/uL (ref 3.87–5.11)
RDW: 14 % (ref 11.5–15.5)
WBC: 5.2 10*3/uL (ref 4.0–10.5)

## 2021-10-21 LAB — LIPID PANEL
Cholesterol: 181 mg/dL (ref 0–200)
HDL: 48.5 mg/dL (ref >39.00)
LDL Cholesterol: 95 mg/dL (ref 0–99)
NonHDL: 132.7
Total CHOL/HDL Ratio: 4
Triglycerides: 188 mg/dL — ABNORMAL HIGH (ref 0.0–149.0)
VLDL: 37.6 mg/dL (ref 0.0–40.0)

## 2021-10-21 LAB — COMPREHENSIVE METABOLIC PANEL
ALT: 12 U/L (ref 0–35)
AST: 16 U/L (ref 0–37)
Albumin: 3.8 g/dL (ref 3.5–5.2)
Alkaline Phosphatase: 82 U/L (ref 39–117)
BUN: 18 mg/dL (ref 6–23)
CO2: 28 mEq/L (ref 19–32)
Calcium: 9 mg/dL (ref 8.4–10.5)
Chloride: 107 mEq/L (ref 96–112)
Creatinine, Ser: 0.82 mg/dL (ref 0.40–1.20)
GFR: 66.92 mL/min (ref >60.00)
Glucose, Bld: 138 mg/dL — ABNORMAL HIGH (ref 70–99)
Potassium: 4.5 mEq/L (ref 3.5–5.1)
Sodium: 142 mEq/L (ref 135–145)
Total Bilirubin: 0.6 mg/dL (ref 0.2–1.2)
Total Protein: 6.3 g/dL (ref 6.0–8.3)

## 2021-10-21 LAB — HEMOGLOBIN A1C: Hgb A1c MFr Bld: 6.4 % (ref 4.6–6.5)

## 2021-10-21 LAB — TSH: TSH: 1.95 u[IU]/mL (ref 0.35–5.50)

## 2021-10-21 NOTE — Progress Notes (Signed)
? ?Subjective:  ? Cheryl Joseph is a 81 y.o. female who presents for an Initial Medicare Annual Wellness Visit. ?Virtual Visit via Telephone Note ? ?I connected with  Cheryl Joseph on 10/21/21 at 11:15 AM EDT by telephone and verified that I am speaking with the correct person using two identifiers. ? ?Location: ?Patient: HOME ?Provider: [facility] ?Persons participating in the virtual visit: patient/Nurse Health Advisor ?  ?I discussed the limitations, risks, security and privacy concerns of performing an evaluation and management service by telephone and the availability of in person appointments. The patient expressed understanding and agreed to proceed. ? ?Interactive audio and video telecommunications were attempted between this nurse and patient, however failed, due to patient having technical difficulties OR patient did not have access to video capability.  We continued and completed visit with audio only. ? ?Some vital signs may be absent or patient reported.  ? ?Chriss Driver, LPN ? ?Review of Systems    ? ?Cardiac Risk Factors include: advanced age (>70mn, >>51women);dyslipidemia;sedentary lifestyle;obesity (BMI >30kg/m2) ? ?   ?Objective:  ?  ?Today's Vitals  ? 10/21/21 1113  ?Weight: 205 lb (93 kg)  ?Height: '5\' 3"'$  (1.6 m)  ? ?Body mass index is 36.31 kg/m?. ? ? ?  10/21/2021  ? 11:21 AM 06/10/2021  ?  6:15 AM 06/04/2021  ?  2:58 PM 05/23/2021  ? 10:26 AM 02/27/2021  ?  9:06 AM 10/10/2020  ?  8:29 AM 09/14/2019  ?  9:37 AM  ?Advanced Directives  ?Does Patient Have a Medical Advance Directive? No No No No No No No  ?Would patient like information on creating a medical advance directive? No - Patient declined  No - Patient declined  Yes (MAU/Ambulatory/Procedural Areas - Information given) No - Patient declined No - Patient declined  ? ? ?Current Medications (verified) ?Outpatient Encounter Medications as of 10/21/2021  ?Medication Sig  ? acetaminophen (TYLENOL) 500 MG tablet Take 1,000 mg by mouth  every 6 (six) hours as needed for mild pain.  ? albuterol (VENTOLIN HFA) 108 (90 Base) MCG/ACT inhaler INHALE TWO PUFFS BY MOUTH EVERY 4 HOURS AS NEEDED FOR WHEEZING AND 2 PUFFS BEFORE EXERCISE OR EXPOSURE TO COLD AIR  ? amLODipine (NORVASC) 5 MG tablet Take 1 tablet (5 mg total) by mouth daily. (Patient taking differently: Take 5 mg by mouth at bedtime.)  ? b complex vitamins tablet Take 1 tablet by mouth every Monday, Wednesday, and Friday.  ? calcium carbonate (OS-CAL - DOSED IN MG OF ELEMENTAL CALCIUM) 1250 (500 Ca) MG tablet Take 2 tablets by mouth daily with breakfast.  ? Cholecalciferol (VITAMIN D3 PO) Take 2 tablets by mouth daily.  ? FLUZONE HIGH-DOSE QUADRIVALENT 0.7 ML SUSY   ? Hyaluronic Acid-Vitamin C (HYALURONIC ACID PO) Take 1 tablet by mouth every Monday, Wednesday, and Friday.  ? montelukast (SINGULAIR) 10 MG tablet Take 1 tablet (10 mg total) by mouth at bedtime.  ? Multiple Vitamin (MULTIVITAMIN) capsule Take 1 capsule by mouth daily. Reported on 12/18/2015  ? Omega-3 Fatty Acids (FISH OIL PO) Take 1 capsule by mouth daily. Reported on 12/18/2015  ? ondansetron (ZOFRAN-ODT) 4 MG disintegrating tablet Take 4 mg by mouth every 8 (eight) hours as needed.  ? OVER THE COUNTER MEDICATION Take 2 capsules by mouth daily. BONE UP  ? oxybutynin (OXYTROL) 3.9 MG/24HR Place 1 patch onto the skin every 3 (three) days. (Patient taking differently: Place 1 patch onto the skin 2 (two) times a week. Wed  and Sun)  ? Probiotic Product (PROBIOTIC DAILY PO) Take 1 tablet by mouth daily.  ? psyllium (METAMUCIL SMOOTH TEXTURE) 28 % packet Take 1 packet by mouth daily.  ? Pumpkin Seed-Soy Germ (AZO BLADDER CONTROL/GO-LESS PO) Take 1 capsule by mouth at bedtime.  ? simvastatin (ZOCOR) 40 MG tablet TAKE 1 TABLET BY MOUTH AT BEDTIME  ? TURMERIC PO Take 720 mg by mouth every Monday, Wednesday, and Friday.  ? Ubiquinol 100 MG CAPS Take 1 capsule by mouth every Monday, Wednesday, and Friday.  ? [DISCONTINUED] Ubiquinol 100 MG  CAPS Take by mouth.  ? ?No facility-administered encounter medications on file as of 10/21/2021.  ? ? ?Allergies (verified) ?Tolterodine tartrate and Vesicare [solifenacin succinate]  ? ?History: ?Past Medical History:  ?Diagnosis Date  ? Anginal pain ([place])   ? Arthritis   ? RA hands(seronegative)  ? Asthma   ? Back pain   ? Cancer [facility])   ? skin  ? Cataract   ? HPV in female 20  ? HPV with colposcopy (all neg paps since)  ? Hyperlipidemia   ? Hypertension   ? Incarcerated ventral hernia   ? Obesity   ? Osteopenia   ? Pre-diabetes   ? Shortness of breath dyspnea   ? Shoulder pain   ? ?Past Surgical History:  ?Procedure Laterality Date  ? CATARACT EXTRACTION, BILATERAL Bilateral   ? COLONOSCOPY  06/01/2004  ? COLONOSCOPY WITH PROPOFOL N/A 05/01/2015  ? Procedure: COLONOSCOPY WITH PROPOFOL;  Surgeon: Manya Silvas, MD;  Location[facility] ENDOSCOPY;  Service: Endoscopy;  Laterality: N/A;  ? CORRECTION HAMMER TOE Right   ? 2nd toe  ? JOINT REPLACEMENT Right   ? right knee replacement January 2021  ? SQUAMOUS CELL CARCINOMA EXCISION Left 12/2017  ? left arm  ? TONSILLECTOMY    ? VAGINAL HYSTERECTOMY  2015  ? with pelvic organ prolapse surgery   ? XI ROBOTIC ASSISTED VENTRAL HERNIA N/A 06/10/2021  ? Procedure: XI ROBOTIC ASSISTED VENTRAL HERNIA;  Surgeon: Herbert Pun, MD;  Location: [facility] ORS;  Service: General;  Laterality: N/A;  ? ?Family History  ?Problem Relation Age of Onset  ? Diabetes Mother   ? Hyperlipidemia Mother   ? Heart disease Mother   ? Hypertension Mother   ? Obesity Mother   ? Kyphosis Mother   ? Heart disease Father   ? Hypertension Father   ? Cancer Father   ?     lung Cancer smoker  ? Diabetes Daughter   ? Kyphosis Sister   ? Breast cancer Neg Hx   ? ?Social History  ? ?Socioeconomic History  ? Marital status: Married  ?  Spouse name: Nada Boozer  ? Number of children: 3  ? Years of education: Not on file  ? Highest education level: Not on file  ?Occupational History  ? Not on file  ?Tobacco Use  ?  Smoking status: Never  ? Smokeless tobacco: Never  ?Vaping Use  ? Vaping Use: Never used  ?Substance and Sexual Activity  ? Alcohol use: No  ?  Alcohol/week: 0.0 standard drinks  ? Drug use: No  ? Sexual activity: Yes  ?Other Topics Concern  ? Not on file  ?Social History Narrative  ? Remarried.  ? 19 grandchildren between pt and husband.  ? 7 great grandchildren.  ? ?Social Determinants of Health  ? ?Financial Resource Strain: Low Risk   ? Difficulty of Paying Living Expenses: Not hard at all  ?Food Insecurity: No Food Insecurity  ?  Worried About Charity fundraiser in the Last Year: Never true  ? Ran Out of Food in the Last Year: Never true  ?Transportation Needs: No Transportation Needs  ? Lack of Transportation (Medical): No  ? Lack of Transportation (Non-Medical): No  ?Physical Activity: Sufficiently Active  ? Days of Exercise per Week: 5 days  ? Minutes of Exercise per Session: 30 min  ?Stress: No Stress Concern Present  ? Feeling of Stress : Not at all  ?Social Connections: Socially Integrated  ? Frequency of Communication with Friends and Family: More than three times a week  ? Frequency of Social Gatherings with Friends and Family: More than three times a week  ? Attends Religious Services: More than 4 times per year  ? Active Member of Clubs or Organizations: Yes  ? Attends Archivist Meetings: More than 4 times per year  ? Marital Status: Married  ? ? ?Tobacco Counseling ?Counseling given: Not Answered ? ? ?Clinical Intake: ? ?Pre-visit preparation completed: Yes ? ?Pain : No/denies pain ? ?  ? ?BMI - recorded: 36.31 ?Nutritional Status: BMI > 30  Obese ?Nutritional Risks: None ?Diabetes: No ? ?How often do you need to have someone help you when you read instructions, pamphlets, or other written materials from your doctor or pharmacy?: 1 - Never ? ?Diabetic?NO ? ?Interpreter Needed?: No ? ?Information entered by :: mj Saphyre Cillo, lpn ? ? ?Activities of Daily Living ? ?  10/21/2021  ? 11:33 AM  06/04/2021  ?  2:45 PM  ?In your present state of health, do you have any difficulty performing the following activities:  ?Hearing? 0 0  ?Vision? 0 0  ?Difficulty concentrating or making decisions? 1 0  ?Comme

## 2021-11-02 ENCOUNTER — Ambulatory Visit (INDEPENDENT_AMBULATORY_CARE_PROVIDER_SITE_OTHER): Payer: Medicare HMO | Admitting: Family Medicine

## 2021-11-02 ENCOUNTER — Encounter: Payer: Self-pay | Admitting: Family Medicine

## 2021-11-02 VITALS — BP 126/68 | HR 64 | Temp 97.7°F | Ht 62.5 in | Wt 205.0 lb

## 2021-11-02 DIAGNOSIS — I10 Essential (primary) hypertension: Secondary | ICD-10-CM

## 2021-11-02 DIAGNOSIS — E669 Obesity, unspecified: Secondary | ICD-10-CM

## 2021-11-02 DIAGNOSIS — M059 Rheumatoid arthritis with rheumatoid factor, unspecified: Secondary | ICD-10-CM | POA: Diagnosis not present

## 2021-11-02 DIAGNOSIS — Z1211 Encounter for screening for malignant neoplasm of colon: Secondary | ICD-10-CM

## 2021-11-02 DIAGNOSIS — K429 Umbilical hernia without obstruction or gangrene: Secondary | ICD-10-CM

## 2021-11-02 DIAGNOSIS — M8589 Other specified disorders of bone density and structure, multiple sites: Secondary | ICD-10-CM | POA: Diagnosis not present

## 2021-11-02 DIAGNOSIS — R7303 Prediabetes: Secondary | ICD-10-CM | POA: Diagnosis not present

## 2021-11-02 DIAGNOSIS — Z0000 Encounter for general adult medical examination without abnormal findings: Secondary | ICD-10-CM | POA: Diagnosis not present

## 2021-11-02 DIAGNOSIS — E785 Hyperlipidemia, unspecified: Secondary | ICD-10-CM

## 2021-11-02 MED ORDER — SIMVASTATIN 40 MG PO TABS: 40.0000 mg | ORAL_TABLET | Freq: Every day | ORAL | 3 refills | Status: DC

## 2021-11-02 MED ORDER — AMLODIPINE BESYLATE 5 MG PO TABS: 5.0000 mg | ORAL_TABLET | Freq: Every day | ORAL | 3 refills | Status: DC

## 2021-11-02 MED ORDER — MONTELUKAST SODIUM 10 MG PO TABS: 10.0000 mg | ORAL_TABLET | Freq: Every day | ORAL | 3 refills | Status: DC

## 2021-11-02 NOTE — Assessment & Plan Note (Signed)
dexa 12/2020 reviewed ?slt worse ?Pt declines tx with alendronate ?No falls or fractures  ?Taking ca and D with good exercise ?Re check in 2024 ?

## 2021-11-02 NOTE — Patient Instructions (Addendum)
If you are interested in the new shingles vaccine (Shingrix) - call your local pharmacy to check on coverage and availability  ?If affordable, get on a wait list at your pharmacy to get the vaccine. ? ?Healthy diet: ?Avoid red meat/ fried foods/ egg yolks/ fatty breakfast meats/ butter, cheese and high fat dairy/ and shellfish   ?Low glycemic diet  ?Try to get most of your carbohydrates from produce (with the exception of white potatoes)  ?Eat less bread/pasta/rice/snack foods/cereals/sweets and other items from the middle of the grocery store (processed carbs) ? ?No change in medication  ? ?Follow up with cardiology as planned  ?

## 2021-11-02 NOTE — Assessment & Plan Note (Signed)
Disc goals for lipids and reasons to control them ?Rev last labs with pt ?Rev low sat fat diet in detail ?Stable with simvastatin 40 mg daily  ?Tolerates it well  ? ?

## 2021-11-02 NOTE — Assessment & Plan Note (Signed)
Colonoscopy 2016 ?Declines further screening  ?

## 2021-11-02 NOTE — Assessment & Plan Note (Signed)
Scar tissue palp just below/L  Of umbilicus ?Pt states this is baseline since surg and her surgeon is aware  ?

## 2021-11-02 NOTE — Assessment & Plan Note (Signed)
Discussed how this problem influences overall health and the risks it imposes  Reviewed plan for weight loss with lower calorie diet (via better food choices and also portion control or program like weight watchers) and exercise building up to or more than 30 minutes 5 days per week including some aerobic activity    

## 2021-11-02 NOTE — Progress Notes (Signed)
? ?Subjective:  ? ? Patient ID: Cheryl Joseph, female    DOB: March 21, 1941, 81 y.o.   MRN: [id number] ? ?HPI ?Here for health maintenance exam and to review chronic medical problems   ? ?Amw was done on 3/29 and reviewed  ? ?See scanned forms.  Routine anticipatory guidance given to patient.  See health maintenance. ?Colon cancer screening: had colonoscopy 2016  , declines further screening  ?Takes fiber for diverticulosis /occ diarrhea  ?Probiotic also  ?Occ immodium  ?Breast cancer screening  mammogram 12/2020 ?Self breast exam: no lumps  ?Flu vaccine: had in the fall  ?Tetanus vaccine  postponed for ins  ?Pneumovax-completed ?Zoster vaccine interested if covered/will check  ?Dexa 12/2020 osteopenia   ?Declines tx/alendronate  ?Falls: none ?Fractures: none  ?Supplements: is on ca and D  ?Exercise : 20 min on bike and 10 min walking daily  ? ?Some pain upper arms on/off  ?H/o RA (long time ago) -then was told she did not have it  ? ?Cognition is excellent  ? ?PMH and SH reviewed ? ?Meds, vitals, and allergies reviewed.  ? ?ROS: See HPI.  Otherwise negative.   ? ?Weight : ?Wt Readings from Last 3 Encounters:  ?11/02/21 205 lb (93 kg)  ?10/21/21 205 lb (93 kg)  ?06/10/21 207 lb (93.9 kg)  ? ?36.90 kg/m? ?Doing well  ? ?Another great grandchild on the way  ?Re married-big family now ?Really enjoys it  ? ?Hearing/vision: ?Hearing Screening  ? '1000Hz'$  '2000Hz'$  '4000Hz'$   ?Right ear 40 40 40  ?Left ear 40 40 40  ? ?Vision Screening  ? Right eye Left eye Both eyes  ?Without correction     ?With correction '20/20 20/25 20/20 '$  ? ? ? ?PHQ: ? ?  11/02/2021  ? 10:01 AM 10/21/2021  ? 11:18 AM 02/27/2021  ?  9:23 AM 10/10/2020  ?  8:30 AM 09/14/2019  ?  9:38 AM  ?Depression screen PHQ 2/9  ?Decreased Interest 0 0 0 0 0  ?Down, Depressed, Hopeless 0 0 0 0 0  ?PHQ - 2 Score 0 0 0 0 0  ?Altered sleeping    0 0  ?Tired, decreased energy    0 0  ?Change in appetite    0 0  ?Feeling bad or failure about yourself     0 0  ?Trouble concentrating    0  0  ?Moving slowly or fidgety/restless    0 0  ?Suicidal thoughts    0 0  ?PHQ-9 Score    0 0  ?Difficult doing work/chores    Not difficult at all Not difficult at all  ? ? ? ?HTN ?bp is stable today  ?No cp or palpitations or headaches or edema  ?No side effects to medicines  ?BP Readings from Last 3 Encounters:  ?11/02/21 126/68  ?06/10/21 131/76  ?05/23/21 (!) 152/69  ?  ? ?Amlodipine 5 mg daily  ?Her bp at home was in 130s/140  ? ? ?Lab Results  ?Component Value Date  ? CREATININE 0.82 10/21/2021  ? BUN 18 10/21/2021  ? NA 142 10/21/2021  ? K 4.5 10/21/2021  ? CL 107 10/21/2021  ? CO2 28 10/21/2021  ? ? ?Hyperlipidemia ?Lab Results  ?Component Value Date  ? CHOL 181 10/21/2021  ? CHOL 172 10/08/2020  ? CHOL 181 07/06/2019  ? ?Lab Results  ?Component Value Date  ? HDL 48.50 10/21/2021  ? HDL 49.70 10/08/2020  ? HDL 44.10 07/06/2019  ? ?Lab Results  ?  Component Value Date  ? [place] 95 10/21/2021  ? [place] 90 10/08/2020  ? LDLCALC 102 (H) 08/14/2014  ? ?Lab Results  ?Component Value Date  ? TRIG 188.0 (H) 10/21/2021  ? TRIG 161.0 (H) 10/08/2020  ? TRIG 251.0 (H) 07/06/2019  ? ?Lab Results  ?Component Value Date  ? CHOLHDL 4 10/21/2021  ? CHOLHDL 3 10/08/2020  ? CHOLHDL 4 07/06/2019  ? ?Lab Results  ?Component Value Date  ? LDLDIRECT 97.0 07/06/2019  ? LDLDIRECT 124.0 07/31/2018  ? LDLDIRECT 114.0 05/27/2017  ? ?Simvastatin 40 mg daily  ?Eating -not perfect  ? ?Lab Results  ?Component Value Date  ? ALT 12 10/21/2021  ? AST 16 10/21/2021  ? ALKPHOS 82 10/21/2021  ? BILITOT 0.6 10/21/2021  ?  ? ?Prediabetes ?Lab Results  ?Component Value Date  ? HGBA1C 6.4 10/21/2021  ?Diet is not great  ?A lot of socialization  ? ?Last few months occ has pain in the center of her chest ?Feet swell on and off  ?Sees her cardiologist next week /Dr Calwood  ? ?She had surgery on her hernia (ventral)  ?Had fluid collection afterwards -was drained  ? ?Patient Active Problem List  ? Diagnosis Date Noted  ? Umbilical hernia 95/62/1308  ?  Frequent stools 01/04/2020  ? Pre-operative general physical examination 07/06/2019  ? Lower abdominal pain 08/16/2018  ? Stress reaction 09/05/2017  ? Estrogen deficiency 05/25/2016  ? Routine general medical examination at a health care facility 11/23/2015  ? Hammertoe 10/28/2015  ? Frequent loose stools 02/14/2015  ? Diverticulosis of colon without hemorrhage 02/14/2015  ? Colon cancer screening 08/19/2014  ? Essential hypertension 02/25/2014  ? Encounter for Medicare annual wellness exam 07/16/2013  ? Prediabetes 05/14/2009  ? Rheumatoid arthritis with positive rheumatoid factor ([place]) 05/13/2008  ? Osteopenia 03/07/2007  ? Hyperlipidemia 01/03/2007  ? Obesity (BMI 30-39.9) 01/03/2007  ? ALLERGIC RHINITIS, SEASONAL 01/03/2007  ? ASTHMA 01/03/2007  ? OVERACTIVE BLADDER 01/03/2007  ? ?Past Medical History:  ?Diagnosis Date  ? Anginal pain ([place])   ? Arthritis   ? RA hands(seronegative)  ? Asthma   ? Back pain   ? Cancer [facility])   ? skin  ? Cataract   ? HPV in female 61  ? HPV with colposcopy (all neg paps since)  ? Hyperlipidemia   ? Hypertension   ? Incarcerated ventral hernia   ? Obesity   ? Osteopenia   ? Pre-diabetes   ? Shortness of breath dyspnea   ? Shoulder pain   ? ?Past Surgical History:  ?Procedure Laterality Date  ? CATARACT EXTRACTION, BILATERAL Bilateral   ? COLONOSCOPY  06/01/2004  ? COLONOSCOPY WITH PROPOFOL N/A 05/01/2015  ? Procedure: COLONOSCOPY WITH PROPOFOL;  Surgeon: Manya Silvas, MD;  Location: [facility] ENDOSCOPY;  Service: Endoscopy;  Laterality: N/A;  ? CORRECTION HAMMER TOE Right   ? 2nd toe  ? JOINT REPLACEMENT Right   ? right knee replacement January 2021  ? SQUAMOUS CELL CARCINOMA EXCISION Left 12/2017  ? left arm  ? TONSILLECTOMY    ? VAGINAL HYSTERECTOMY  2015  ? with pelvic organ prolapse surgery   ? XI ROBOTIC ASSISTED VENTRAL HERNIA N/A 06/10/2021  ? Procedure: XI ROBOTIC ASSISTED VENTRAL HERNIA;  Surgeon: Herbert Pun, MD;  Location: [facility] ORS;  Service: General;   Laterality: N/A;  ? ?Social History  ? ?Tobacco Use  ? Smoking status: Never  ? Smokeless tobacco: Never  ?Vaping Use  ? Vaping Use: Never used  ?Substance Use  Topics  ? Alcohol use: No  ?  Alcohol/week: 0.0 standard drinks  ? Drug use: No  ? ?Family History  ?Problem Relation Age of Onset  ? Diabetes Mother   ? Hyperlipidemia Mother   ? Heart disease Mother   ? Hypertension Mother   ? Obesity Mother   ? Kyphosis Mother   ? Heart disease Father   ? Hypertension Father   ? Cancer Father   ?     lung Cancer smoker  ? Diabetes Daughter   ? Kyphosis Sister   ? Breast cancer Neg Hx   ? ?Allergies  ?Allergen Reactions  ? Tolterodine Tartrate Other (See Comments)  ?  Unknown (back ache)  ? Vesicare [Solifenacin Succinate]   ?  Eye problems   ? ?Current Outpatient Medications on File Prior to Visit  ?Medication Sig Dispense Refill  ? acetaminophen (TYLENOL) 500 MG tablet Take 1,000 mg by mouth every 6 (six) hours as needed for mild pain.    ? albuterol (VENTOLIN HFA) 108 (90 Base) MCG/ACT inhaler INHALE TWO PUFFS BY MOUTH EVERY 4 HOURS AS NEEDED FOR WHEEZING AND 2 PUFFS BEFORE EXERCISE OR EXPOSURE TO COLD AIR 18 each 2  ? b complex vitamins tablet Take 1 tablet by mouth every Monday, Wednesday, and Friday.    ? calcium carbonate (OS-CAL - DOSED IN MG OF ELEMENTAL CALCIUM) 1250 (500 Ca) MG tablet Take 2 tablets by mouth daily with breakfast.    ? Cholecalciferol (VITAMIN D3 PO) Take 2 tablets by mouth daily.    ? FLUZONE HIGH-DOSE QUADRIVALENT 0.7 ML SUSY     ? Hyaluronic Acid-Vitamin C (HYALURONIC ACID PO) Take 1 tablet by mouth every Monday, Wednesday, and Friday.    ? Multiple Vitamin (MULTIVITAMIN) capsule Take 1 capsule by mouth daily. Reported on 12/18/2015    ? Omega-3 Fatty Acids (FISH OIL PO) Take 1 capsule by mouth daily. Reported on 12/18/2015    ? ondansetron (ZOFRAN-ODT) 4 MG disintegrating tablet Take 4 mg by mouth every 8 (eight) hours as needed.    ? OVER THE COUNTER MEDICATION Take 2 capsules by mouth daily.  BONE UP    ? oxybutynin (OXYTROL) 3.9 MG/24HR Place 1 patch onto the skin every 3 (three) days. (Patient taking differently: Place 1 patch onto the skin 2 (two) times a week. Wed and Sun) 8 patch 12  ? Probioti

## 2021-11-02 NOTE — Assessment & Plan Note (Signed)
Reviewed health habits including diet and exercise and skin cancer prevention ?Reviewed appropriate screening tests for age  ?Also reviewed health mt list, fam hx and immunization status , as well as social and family history   ?See HPI ?Labs reviewed  ?Declines colon screen (colonscopy 2016) ?Mammogram due in June  ?Interested in shingrix and will check on coverage ?dexa utd, no falls or fx ?pHq 0 ? ?

## 2021-11-02 NOTE — Assessment & Plan Note (Signed)
bp in fair control at this time  ?BP Readings from Last 1 Encounters:  ?11/02/21 126/68  ? ?No changes needed ?Most recent labs reviewed  ?Disc lifstyle change with low sodium diet and exercise  ?Taking amlodipine 5 mg daily, some pedal edema with this but tolerates it  ?

## 2021-11-02 NOTE — Assessment & Plan Note (Signed)
Lab Results  ?Component Value Date  ? HGBA1C 6.4 10/21/2021  ? ?This is up  ?Discussed low glycemic diet goals as well as wt loss  ?

## 2021-11-02 NOTE — Assessment & Plan Note (Signed)
occ shoulder/UE pain (not all the time) ?No medications  ?

## 2021-11-11 DIAGNOSIS — I208 Other forms of angina pectoris: Secondary | ICD-10-CM | POA: Diagnosis not present

## 2021-11-11 DIAGNOSIS — R0602 Shortness of breath: Secondary | ICD-10-CM | POA: Diagnosis not present

## 2021-11-11 DIAGNOSIS — R011 Cardiac murmur, unspecified: Secondary | ICD-10-CM | POA: Diagnosis not present

## 2021-11-12 ENCOUNTER — Ambulatory Visit (INDEPENDENT_AMBULATORY_CARE_PROVIDER_SITE_OTHER): Payer: Medicare HMO

## 2021-11-12 DIAGNOSIS — K5730 Diverticulosis of large intestine without perforation or abscess without bleeding: Secondary | ICD-10-CM

## 2021-11-12 DIAGNOSIS — I10 Essential (primary) hypertension: Secondary | ICD-10-CM

## 2021-11-12 DIAGNOSIS — E785 Hyperlipidemia, unspecified: Secondary | ICD-10-CM

## 2021-11-12 NOTE — Chronic Care Management (AMB) (Signed)
?Chronic Care Management  ? ?CCM RN Visit Note ? ?11/12/2021 ?Name: Cheryl Joseph MRN: [id number] DOB: [birth year] ? ?Subjective: ?Cheryl Joseph is a 81 y.o. year old female who is a primary care patient of Tower, Wynelle Fanny, MD. The care management team was consulted for assistance with disease management and care coordination needs.   ? ?Engaged with patient by telephone for follow up visit in response to provider referral for case management and/or care coordination services.  ? ?Consent to Services:  ?The patient was given information about Chronic Care Management services, agreed to services, and gave verbal consent prior to initiation of services.  Please see initial visit note for detailed documentation.  ? ?Patient agreed to services and verbal consent obtained.  ? ?Assessment: Review of patient past medical history, allergies, medications, health status, including review of consultants reports, laboratory and other test data, was performed as part of comprehensive evaluation and provision of chronic care management services.  ? ?SDOH (Social Determinants of Health) assessments and interventions performed:   ? ?CCM Care Plan ? ?Allergies  ?Allergen Reactions  ? Tolterodine Tartrate Other (See Comments)  ?  Unknown (back ache)  ? Vesicare [Solifenacin Succinate]   ?  Eye problems   ? ? ?Outpatient Encounter Medications as of 11/12/2021  ?Medication Sig  ? acetaminophen (TYLENOL) 500 MG tablet Take 1,000 mg by mouth every 6 (six) hours as needed for mild pain.  ? albuterol (VENTOLIN HFA) 108 (90 Base) MCG/ACT inhaler INHALE TWO PUFFS BY MOUTH EVERY 4 HOURS AS NEEDED FOR WHEEZING AND 2 PUFFS BEFORE EXERCISE OR EXPOSURE TO COLD AIR  ? amLODipine (NORVASC) 5 MG tablet Take 1 tablet (5 mg total) by mouth daily.  ? b complex vitamins tablet Take 1 tablet by mouth every Monday, Wednesday, and Friday.  ? calcium carbonate (OS-CAL - DOSED IN MG OF ELEMENTAL CALCIUM) 1250 (500 Ca) MG tablet Take 2 tablets by mouth  daily with breakfast.  ? Cholecalciferol (VITAMIN D3 PO) Take 2 tablets by mouth daily.  ? FLUZONE HIGH-DOSE QUADRIVALENT 0.7 ML SUSY   ? Hyaluronic Acid-Vitamin C (HYALURONIC ACID PO) Take 1 tablet by mouth every Monday, Wednesday, and Friday.  ? montelukast (SINGULAIR) 10 MG tablet Take 1 tablet (10 mg total) by mouth at bedtime.  ? Multiple Vitamin (MULTIVITAMIN) capsule Take 1 capsule by mouth daily. Reported on 12/18/2015  ? Omega-3 Fatty Acids (FISH OIL PO) Take 1 capsule by mouth daily. Reported on 12/18/2015  ? ondansetron (ZOFRAN-ODT) 4 MG disintegrating tablet Take 4 mg by mouth every 8 (eight) hours as needed.  ? OVER THE COUNTER MEDICATION Take 2 capsules by mouth daily. BONE UP  ? oxybutynin (OXYTROL) 3.9 MG/24HR Place 1 patch onto the skin every 3 (three) days. (Patient taking differently: Place 1 patch onto the skin 2 (two) times a week. Wed and Sun)  ? Probiotic Product (PROBIOTIC DAILY PO) Take 1 tablet by mouth daily.  ? psyllium (METAMUCIL SMOOTH TEXTURE) 28 % packet Take 1 packet by mouth daily.  ? Pumpkin Seed-Soy Germ (AZO BLADDER CONTROL/GO-LESS PO) Take 1 capsule by mouth at bedtime.  ? simvastatin (ZOCOR) 40 MG tablet Take 1 tablet (40 mg total) by mouth at bedtime.  ? TURMERIC PO Take 720 mg by mouth every Monday, Wednesday, and Friday.  ? Ubiquinol 100 MG CAPS Take 1 capsule by mouth every Monday, Wednesday, and Friday.  ? ?No facility-administered encounter medications on file as of 11/12/2021.  ? ? ?Patient Active Problem List  ?  Diagnosis Date Noted  ? Umbilical hernia 31/54/0086  ? Frequent stools 01/04/2020  ? Pre-operative general physical examination 07/06/2019  ? Lower abdominal pain 08/16/2018  ? Stress reaction 09/05/2017  ? Estrogen deficiency 05/25/2016  ? Routine general medical examination at a health care facility 11/23/2015  ? Hammertoe 10/28/2015  ? Frequent loose stools 02/14/2015  ? Diverticulosis of colon without hemorrhage 02/14/2015  ? Colon cancer screening 08/19/2014   ? Essential hypertension 02/25/2014  ? Encounter for Medicare annual wellness exam 07/16/2013  ? Prediabetes 05/14/2009  ? Rheumatoid arthritis with positive rheumatoid factor ([place]) 05/13/2008  ? Osteopenia 03/07/2007  ? Hyperlipidemia 01/03/2007  ? Obesity (BMI 30-39.9) 01/03/2007  ? ALLERGIC RHINITIS, SEASONAL 01/03/2007  ? ASTHMA 01/03/2007  ? OVERACTIVE BLADDER 01/03/2007  ? ? ?Conditions to be addressed/monitored:HTN and HLD ? ?Care Plan : RNCM plan of care  ?Updates made by Dannielle Karvonen, RN since 11/12/2021 12:00 AM  ?  ? ?Problem: Chronic disease management education and care coordination needs ( Hypertension, Hyperlipidemia)   ?Priority: High  ?  ? ?Long-Range Goal: Development of plan of care to address chronic disease management and care coordination needs ( Hypertension, hyperlipidemia)   ?Start Date: 06/08/2021  ?Expected End Date: 01/22/2022  ?Priority: High  ?Note:   ?Current Barriers:  ?Knowledge Deficits related to plan of care for management of HTN and HLD  ?Chronic Disease Management support and education needs related to HTN and HLD ?Patient states she saw the general surgeon regarding the knot at her hernia repair site. She states surgeon drained the area. Denies and pain/ discomfort.   RNCM reviewed signs/ symptoms of infection. Advised to notify surgeon of these symptoms.  Patient reports her blood pressures ranged from 120's/ 60's to 130/80's.  Patient reports having annual wellness visit on 11/02/21. She reports having follow up visit with cardiologist. Patient states she is scheduled to have a stress test and echocardiogram in May 2023 and follow up with cardiologist in June 2023.    ?RNCM Clinical Goal(s):  ?Patient will verbalize understanding of plan for management of HTN and HLD through collaboration with RN case manager, provider and care team.  ?take all medications exactly as prescribed and will call provider for medication related questions ?attend all scheduled medical  appointments ?continue to work with RN Care Manager to address care management and care coordination needs related to  HTN and HLD through collaboration with RN Care manager, provider, and care team.  ? ?Interventions: ?1:1 collaboration with primary care provider regarding development and update of comprehensive plan of care as evidenced by provider attestation and co-signature ?Inter-disciplinary care team collaboration (see longitudinal plan of care) ?Evaluation of current treatment plan related to  self management and patient's adherence to plan as established by provider ? ?Hypertension Interventions: Goal:  On Track,  Long term  ?Last practice recorded BP readings:  ?BP Readings from Last 3 Encounters:  ?11/02/21 126/68  ?06/10/21 131/76  ?05/23/21 (!) 152/69  ?Most recent eGFR/CrCl: No results found for: EGFR  No components found for: CRCL ? ?Evaluation of current treatment plan related to hypertension self management and patient's adherence to plan as established by provider ?Reviewed medications with patient and discussed importance of compliance ?Discussed plans with patient for ongoing care management follow up and provided patient with direct contact information for care management team ?Advised patient, providing education and rationale, to monitor blood pressure daily and record, calling PCP for findings outside established parameters ?Reviewed scheduled / upcoming provider appointments ?Advised to  follow low salt diet.  ?Advised to continue exercising at least 3 times per week  ? ?Hyperlipidemia Interventions:  Goal: On Track,  Long term ?Medication review performed; medication list updated in electronic medical record.  ?Reviewed importance of limiting foods high in cholesterol ?Reviewed exercise goals and target of 150 minutes per week;  ?Advised to follow heart healthy diet.  ? ? ?Patient Goals/Self-Care Activities: ? Continue to take medications as prescribed and refill timely. ? Attend all  scheduled provider appointments  ?Call provider office for new concerns or questions  ?- check blood pressure weekly ?- take blood monitor to your provider office to be calibrated ?- Notify your surgeon for sign/symptoms o

## 2021-11-12 NOTE — Patient Instructions (Signed)
Visit Information ? ?Thank you for taking time to visit with me today. Please don't hesitate to contact me if I can be of assistance to you before our next scheduled telephone appointment. ? ?Following are the goals we discussed today:  ? Continue to take medications as prescribed and refill timely. ? Attend all scheduled provider appointments  ?Call provider office for new concerns or questions  ?- check blood pressure weekly ?- take blood monitor to your provider office to be calibrated ?- Notify your surgeon for sign/symptoms of infection  ?- Continue to eat more whole grains, fruits and vegetables, lean meats and healthy fats ?Continue to follow a low salt diet. ? ?Our next appointment is by telephone on 01/18/22 at 9:30 am ? ?Please call the care guide team at [phone] if you need to cancel or reschedule your appointment.  ? ?If you are experiencing a Mental Health or [place] or need someone to talk to, please call the Suicide and Crisis Lifeline: 988 ?call 1-800-273-TALK (toll free, 24 hour hotline)  ? ?Patient verbalizes understanding of instructions and care plan provided today and agrees to view in [place]. Active MyChart status confirmed with patient.   ? ?Quinn Plowman RN,BSN,CCM ?RN Case Manager ?[place]  ?[phone] ? ?

## 2021-11-22 DIAGNOSIS — I10 Essential (primary) hypertension: Secondary | ICD-10-CM | POA: Diagnosis not present

## 2021-11-22 DIAGNOSIS — E785 Hyperlipidemia, unspecified: Secondary | ICD-10-CM | POA: Diagnosis not present

## 2021-12-04 ENCOUNTER — Other Ambulatory Visit: Payer: Self-pay | Admitting: Family Medicine

## 2021-12-04 DIAGNOSIS — Z1231 Encounter for screening mammogram for malignant neoplasm of breast: Secondary | ICD-10-CM

## 2021-12-10 DIAGNOSIS — R0602 Shortness of breath: Secondary | ICD-10-CM | POA: Diagnosis not present

## 2021-12-10 DIAGNOSIS — R011 Cardiac murmur, unspecified: Secondary | ICD-10-CM | POA: Diagnosis not present

## 2021-12-10 DIAGNOSIS — I208 Other forms of angina pectoris: Secondary | ICD-10-CM | POA: Diagnosis not present

## 2021-12-18 DIAGNOSIS — Z85828 Personal history of other malignant neoplasm of skin: Secondary | ICD-10-CM | POA: Diagnosis not present

## 2021-12-18 DIAGNOSIS — L821 Other seborrheic keratosis: Secondary | ICD-10-CM | POA: Diagnosis not present

## 2021-12-18 DIAGNOSIS — D2261 Melanocytic nevi of right upper limb, including shoulder: Secondary | ICD-10-CM | POA: Diagnosis not present

## 2021-12-18 DIAGNOSIS — H43813 Vitreous degeneration, bilateral: Secondary | ICD-10-CM | POA: Diagnosis not present

## 2021-12-18 DIAGNOSIS — D225 Melanocytic nevi of trunk: Secondary | ICD-10-CM | POA: Diagnosis not present

## 2021-12-18 DIAGNOSIS — L570 Actinic keratosis: Secondary | ICD-10-CM | POA: Diagnosis not present

## 2021-12-28 DIAGNOSIS — J45909 Unspecified asthma, uncomplicated: Secondary | ICD-10-CM | POA: Diagnosis not present

## 2021-12-28 DIAGNOSIS — Z6835 Body mass index (BMI) 35.0-35.9, adult: Secondary | ICD-10-CM | POA: Diagnosis not present

## 2021-12-28 DIAGNOSIS — R600 Localized edema: Secondary | ICD-10-CM | POA: Diagnosis not present

## 2021-12-28 DIAGNOSIS — I208 Other forms of angina pectoris: Secondary | ICD-10-CM | POA: Diagnosis not present

## 2021-12-28 DIAGNOSIS — I10 Essential (primary) hypertension: Secondary | ICD-10-CM | POA: Diagnosis not present

## 2021-12-28 DIAGNOSIS — R011 Cardiac murmur, unspecified: Secondary | ICD-10-CM | POA: Diagnosis not present

## 2021-12-28 DIAGNOSIS — R0602 Shortness of breath: Secondary | ICD-10-CM | POA: Diagnosis not present

## 2021-12-28 DIAGNOSIS — E782 Mixed hyperlipidemia: Secondary | ICD-10-CM | POA: Diagnosis not present

## 2021-12-31 ENCOUNTER — Observation Stay: Payer: Medicare HMO

## 2021-12-31 ENCOUNTER — Emergency Department: Payer: Medicare HMO

## 2021-12-31 ENCOUNTER — Observation Stay
Admission: EM | Admit: 2021-12-31 | Discharge: 2022-01-01 | Disposition: A | Discharge status: Home / Self Care | Payer: Medicare HMO | Attending: Hospitalist | Admitting: Hospitalist

## 2021-12-31 ENCOUNTER — Other Ambulatory Visit: Payer: Self-pay

## 2021-12-31 ENCOUNTER — Encounter: Payer: Self-pay | Admitting: Emergency Medicine

## 2021-12-31 DIAGNOSIS — I639 Cerebral infarction, unspecified: Secondary | ICD-10-CM | POA: Diagnosis not present

## 2021-12-31 DIAGNOSIS — I10 Essential (primary) hypertension: Secondary | ICD-10-CM | POA: Insufficient documentation

## 2021-12-31 DIAGNOSIS — I672 Cerebral atherosclerosis: Secondary | ICD-10-CM | POA: Diagnosis not present

## 2021-12-31 DIAGNOSIS — R29818 Other symptoms and signs involving the nervous system: Secondary | ICD-10-CM | POA: Diagnosis not present

## 2021-12-31 DIAGNOSIS — Z96651 Presence of right artificial knee joint: Secondary | ICD-10-CM | POA: Diagnosis not present

## 2021-12-31 DIAGNOSIS — R531 Weakness: Secondary | ICD-10-CM | POA: Diagnosis not present

## 2021-12-31 DIAGNOSIS — Z79899 Other long term (current) drug therapy: Secondary | ICD-10-CM | POA: Insufficient documentation

## 2021-12-31 DIAGNOSIS — Z85828 Personal history of other malignant neoplasm of skin: Secondary | ICD-10-CM | POA: Diagnosis not present

## 2021-12-31 DIAGNOSIS — I700 Atherosclerosis of aorta: Secondary | ICD-10-CM | POA: Diagnosis not present

## 2021-12-31 DIAGNOSIS — J45909 Unspecified asthma, uncomplicated: Secondary | ICD-10-CM | POA: Diagnosis not present

## 2021-12-31 DIAGNOSIS — I6523 Occlusion and stenosis of bilateral carotid arteries: Secondary | ICD-10-CM | POA: Diagnosis not present

## 2021-12-31 DIAGNOSIS — Z8673 Personal history of transient ischemic attack (TIA), and cerebral infarction without residual deficits: Secondary | ICD-10-CM | POA: Diagnosis present

## 2021-12-31 DIAGNOSIS — J3489 Other specified disorders of nose and nasal sinuses: Secondary | ICD-10-CM | POA: Diagnosis not present

## 2021-12-31 DIAGNOSIS — R200 Anesthesia of skin: Secondary | ICD-10-CM | POA: Diagnosis present

## 2021-12-31 DIAGNOSIS — R001 Bradycardia, unspecified: Secondary | ICD-10-CM | POA: Diagnosis not present

## 2021-12-31 LAB — URINALYSIS, ROUTINE W REFLEX MICROSCOPIC
Bilirubin Urine: NEGATIVE
Glucose, UA: NEGATIVE mg/dL
Hgb urine dipstick: NEGATIVE
Ketones, ur: NEGATIVE mg/dL
Leukocytes,Ua: NEGATIVE
Nitrite: NEGATIVE
Protein, ur: NEGATIVE mg/dL
Specific Gravity, Urine: 1.006 (ref 1.005–1.030)
pH: 6 (ref 5.0–8.0)

## 2021-12-31 LAB — COMPREHENSIVE METABOLIC PANEL
ALT: 13 U/L (ref 0–44)
AST: 21 U/L (ref 15–41)
Albumin: 3.7 g/dL (ref 3.5–5.0)
Alkaline Phosphatase: 74 U/L (ref 38–126)
Anion gap: 4 — ABNORMAL LOW (ref 5–15)
BUN: 18 mg/dL (ref 8–23)
CO2: 26 mmol/L (ref 22–32)
Calcium: 9.1 mg/dL (ref 8.9–10.3)
Chloride: 111 mmol/L (ref 98–111)
Creatinine, Ser: 0.68 mg/dL (ref 0.44–1.00)
GFR, Estimated: >60 mL/min (ref >60)
Glucose, Bld: 121 mg/dL — ABNORMAL HIGH (ref 70–99)
Potassium: 4 mmol/L (ref 3.5–5.1)
Sodium: 141 mmol/L (ref 135–145)
Total Bilirubin: 0.3 mg/dL (ref 0.3–1.2)
Total Protein: 7 g/dL (ref 6.5–8.1)

## 2021-12-31 LAB — APTT: aPTT: 27 seconds (ref 24–36)

## 2021-12-31 LAB — CBC
HCT: 46.4 % — ABNORMAL HIGH (ref 36.0–46.0)
Hemoglobin: 15.1 g/dL — ABNORMAL HIGH (ref 12.0–15.0)
MCH: 30.8 pg (ref 26.0–34.0)
MCHC: 32.5 g/dL (ref 30.0–36.0)
MCV: 94.7 fL (ref 80.0–100.0)
Platelets: 235 10*3/uL (ref 150–400)
RBC: 4.9 MIL/uL (ref 3.87–5.11)
RDW: 13.3 % (ref 11.5–15.5)
WBC: 7.1 10*3/uL (ref 4.0–10.5)
nRBC: 0 % (ref 0.0–0.2)

## 2021-12-31 LAB — DIFFERENTIAL
Abs Immature Granulocytes: 0.03 10*3/uL (ref 0.00–0.07)
Basophils Absolute: 0.1 10*3/uL (ref 0.0–0.1)
Basophils Relative: 1 %
Eosinophils Absolute: 0.1 10*3/uL (ref 0.0–0.5)
Eosinophils Relative: 1 %
Immature Granulocytes: 0 %
Lymphocytes Relative: 27 %
Lymphs Abs: 1.9 10*3/uL (ref 0.7–4.0)
Monocytes Absolute: 0.6 10*3/uL (ref 0.1–1.0)
Monocytes Relative: 9 %
Neutro Abs: 4.4 10*3/uL (ref 1.7–7.7)
Neutrophils Relative %: 62 %

## 2021-12-31 LAB — PROTIME-INR
INR: 1 (ref 0.8–1.2)
Prothrombin Time: 12.7 seconds (ref 11.4–15.2)

## 2021-12-31 MED ORDER — IOHEXOL 350 MG/ML SOLN
75.0000 mL | Freq: Once | INTRAVENOUS | Status: AC | PRN
Start: 2021-12-31 — End: 2021-12-31
  Administered 2021-12-31: 75 mL via INTRAVENOUS

## 2021-12-31 MED ORDER — DOCUSATE SODIUM 100 MG PO CAPS: 100.0000 mg | ORAL_CAPSULE | Freq: Two times a day (BID) | ORAL | Status: DC | PRN

## 2021-12-31 MED ORDER — SIMVASTATIN 10 MG PO TABS: 40.0000 mg | ORAL_TABLET | Freq: Every day | ORAL | Status: DC

## 2021-12-31 MED ORDER — ACETAMINOPHEN 500 MG PO TABS: 1000.0000 mg | ORAL_TABLET | Freq: Three times a day (TID) | ORAL | Status: DC | PRN

## 2021-12-31 MED ORDER — ASPIRIN 81 MG PO CHEW
325.0000 mg | CHEWABLE_TABLET | Freq: Once | ORAL | Status: AC
  Administered 2021-12-31: 325 mg via ORAL
  Filled 2021-12-31: qty 5

## 2021-12-31 MED ORDER — STROKE: EARLY STAGES OF RECOVERY BOOK: Freq: Once | Status: DC

## 2021-12-31 MED ORDER — POLYETHYLENE GLYCOL 3350 17 G PO PACK: 17.0000 g | PACK | Freq: Two times a day (BID) | ORAL | Status: DC | PRN

## 2021-12-31 MED ORDER — ENOXAPARIN SODIUM 40 MG/0.4ML IJ SOSY
40.0000 mg | PREFILLED_SYRINGE | INTRAMUSCULAR | Status: DC
  Administered 2022-01-01: 40 mg via SUBCUTANEOUS
  Filled 2021-12-31: qty 0.4

## 2021-12-31 MED ORDER — ATORVASTATIN CALCIUM 20 MG PO TABS
80.0000 mg | ORAL_TABLET | Freq: Every day | ORAL | Status: DC
  Administered 2021-12-31 – 2022-01-01 (×2): 80 mg via ORAL
  Filled 2021-12-31 (×2): qty 4

## 2021-12-31 NOTE — ED Notes (Signed)
Pt in bed, pt has slight weakness in R leg but is able to lift it and hold it up without drift.  Reports equal sensation bilaterally, no facial droop noted.

## 2021-12-31 NOTE — ED Notes (Signed)
Pt sitting up in bed, pt oriented and answering questions appropriately, pt has some R sided weakness, no drift noted, pt has L sided facial fold flattening, no slurred speech noted.

## 2021-12-31 NOTE — H&P (Signed)
History and Physical    Cheryl Joseph ZDG:[id number] DOB: [birth year] DOA: 12/31/2021  PCP: Abner Greenspan, MD  Patient coming from: home  I have personally briefly reviewed patient's old medical records in [place]  Chief Complaint: right leg weakness and right arm numbness  HPI: Cheryl Joseph is a 81 y.o. female with medical history significant of HTN, HLD, hypertriglyceridemia, and asthma who presented with right-sided neurological symptoms.    Pt reported going to bed normal last night.  Woke up around 3 am and noticed right leg dragging and right arm numbness.  This morning, pt was having difficulty using her right hand to comb her hair, and could not grasp.  Symptoms still present after arrival to the ED but improved.  Prior to this episode, pt was at her baseline health.  Generally healthy.  No personal hx of stroke, but mother had multiple strokes.  Has hx of elevated triglycerides.     ED Course: initial vitals: afebrile, pulse 67, BP 157/70, RR 14, sating 100% on room air.  Labs unremarkable.  MRI brain showed Small acute perforator infarct in the left basal ganglia.    Assessment/Plan Principal Problem:   Stroke [facility])  # Acute stroke --MRI brain showed Small acute perforator infarct in the left basal ganglia.   --LDL 95 in March 2023.  Takes simvastatin 40 mg daily at home.  Not on ASA. Plan: --ASA 325 x1 now --increase statin to Lipitor 80 mg daily --Hold home amlodipine for permissive HTN --CTA head/neck --Echo --monitor on tele --Neuro consult tomorrow morning  # HTN --Hold home amlodipine for permissive HTN  # Asthma --stable   DVT prophylaxis: Lovenox SQ Code Status: Full code  Family Communication: husband updated at bedside on admission  Disposition Plan: home  Consults called:  Level of care: Med-Surg   Review of Systems: As per HPI otherwise complete review of systems negative.   Past Medical History:  Diagnosis Date   Anginal  pain ([place])    Arthritis    RA hands(seronegative)   Asthma    Back pain    Cancer (HCC)    skin   Cataract    HPV in female 1996   HPV with colposcopy (all neg paps since)   Hyperlipidemia    Hypertension    Incarcerated ventral hernia    Obesity    Osteopenia    Pre-diabetes    Shortness of breath dyspnea    Shoulder pain     Past Surgical History:  Procedure Laterality Date   CATARACT EXTRACTION, BILATERAL Bilateral    COLONOSCOPY  06/01/2004   COLONOSCOPY WITH PROPOFOL N/A 05/01/2015   Procedure: COLONOSCOPY WITH PROPOFOL;  Surgeon: Manya Silvas, MD;  Location: [facility];  Service: Endoscopy;  Laterality: N/A;   CORRECTION HAMMER TOE Right    2nd toe   JOINT REPLACEMENT Right    right knee replacement January 2021   SQUAMOUS CELL CARCINOMA EXCISION Left 12/2017   left arm   TONSILLECTOMY     VAGINAL HYSTERECTOMY  2015   with pelvic organ prolapse surgery    XI ROBOTIC ASSISTED VENTRAL HERNIA N/A 06/10/2021   Procedure: XI ROBOTIC ASSISTED VENTRAL HERNIA;  Surgeon: Herbert Pun, MD;  Location: [facility];  Service: General;  Laterality: N/A;     reports that she has never smoked. She has never used smokeless tobacco. She reports that she does not drink alcohol and does not use drugs.  Allergies  Allergen Reactions  Tolterodine Tartrate Other (See Comments)    Unknown (back ache)   Vesicare [Solifenacin Succinate]     Eye problems     Family History  Problem Relation Age of Onset   Diabetes Mother    Hyperlipidemia Mother    Heart disease Mother    Hypertension Mother    Obesity Mother    Kyphosis Mother    Heart disease Father    Hypertension Father    Cancer Father        lung Cancer smoker   Diabetes Daughter    Kyphosis Sister    Breast cancer Neg Hx     Prior to Admission medications   Medication Sig Start Date End Date Taking? Authorizing Provider  acetaminophen (TYLENOL) 500 MG tablet Take 1,000 mg by mouth every 6 (six)  hours as needed for mild pain.    [provider]  albuterol (VENTOLIN HFA) 108 (90 Base) MCG/ACT inhaler INHALE TWO PUFFS BY MOUTH EVERY 4 HOURS AS NEEDED FOR WHEEZING AND 2 PUFFS BEFORE EXERCISE OR EXPOSURE TO COLD AIR 03/19/21   Tower, Marne A, MD  amLODipine (NORVASC) 5 MG tablet Take 1 tablet (5 mg total) by mouth daily. 11/02/21   Tower, Wynelle Fanny, MD  b complex vitamins tablet Take 1 tablet by mouth every Monday, Wednesday, and Friday.    [provider]  calcium carbonate (OS-CAL - DOSED IN MG OF ELEMENTAL CALCIUM) 1250 (500 Ca) MG tablet Take 2 tablets by mouth daily with breakfast.    [provider]  Cholecalciferol (VITAMIN D3 PO) Take 2 tablets by mouth daily.    [provider]  FLUZONE HIGH-DOSE QUADRIVALENT 0.7 ML SUSY  05/07/21   [provider]  Hyaluronic Acid-Vitamin C (HYALURONIC ACID PO) Take 1 tablet by mouth every Monday, Wednesday, and Friday.    [provider]  montelukast (SINGULAIR) 10 MG tablet Take 1 tablet (10 mg total) by mouth at bedtime. 11/02/21   Tower, Wynelle Fanny, MD  Multiple Vitamin (MULTIVITAMIN) capsule Take 1 capsule by mouth daily. Reported on 12/18/2015    [provider]  Omega-3 Fatty Acids (FISH OIL PO) Take 1 capsule by mouth daily. Reported on 12/18/2015    [provider]  ondansetron (ZOFRAN-ODT) 4 MG disintegrating tablet Take 4 mg by mouth every 8 (eight) hours as needed. 06/16/21   [provider]  OVER THE COUNTER MEDICATION Take 2 capsules by mouth daily. BONE UP    [provider]  oxybutynin (OXYTROL) 3.9 MG/24HR Place 1 patch onto the skin every 3 (three) days. Patient taking differently: Place 1 patch onto the skin 2 (two) times a week. Wed and Sun 10/29/20   Tower, Wynelle Fanny, MD  Probiotic Product (PROBIOTIC DAILY PO) Take 1 tablet by mouth daily.    [provider]  psyllium (METAMUCIL SMOOTH TEXTURE) 28 % packet Take 1 packet by mouth daily.    [provider]  Pumpkin Seed-Soy Germ (AZO BLADDER CONTROL/GO-LESS PO) Take 1 capsule by mouth at bedtime.    [provider]  simvastatin (ZOCOR) 40 MG tablet Take 1 tablet (40 mg total) by mouth at bedtime. 11/02/21   Tower, Wynelle Fanny, MD  TURMERIC PO Take 720 mg by mouth every Monday, Wednesday, and Friday.    [provider]  Ubiquinol 100 MG CAPS Take 1 capsule by mouth every Monday, Wednesday, and Friday.    [provider]    Physical Exam: Vitals:   12/31/21 1157 12/31/21 1158 12/31/21  1555  BP: (!) 157/70  (!) 172/77  Pulse: 67  67  Resp: 14  16  Temp: 97.8 F (36.6 C)    TempSrc: Oral    SpO2: 100%  96%  Weight:  91.6 kg   Height:  '5\' 2"'$  (1.575 m)     Constitutional: NAD, AAOx3 HEENT: conjunctivae and lids normal, EOMI CV: No cyanosis.   RESP: normal respiratory effort, on RA Extremities: No effusions, edema in BLE SKIN: warm, dry Neuro: II - XII grossly intact.   Psych: Normal mood and affect.  Appropriate judgement and reason   Labs on Admission: I have personally reviewed following labs and imaging studies  CBC: Recent Labs  Lab 12/31/21 1202  WBC 7.1  NEUTROABS 4.4  HGB 15.1*  HCT 46.4*  MCV 94.7  PLT 094   Basic Metabolic Panel: Recent Labs  Lab 12/31/21 1202  NA 141  K 4.0  CL 111  CO2 26  GLUCOSE 121*  BUN 18  CREATININE 0.68  CALCIUM 9.1   GFR: Estimated Creatinine Clearance: 58.1 mL/min (by C-G formula based on SCr of 0.68 mg/dL). Liver Function Tests: Recent Labs  Lab 12/31/21 1202  AST 21  ALT 13  ALKPHOS 74  BILITOT 0.3  PROT 7.0  ALBUMIN 3.7   No results for input(s): "LIPASE", "AMYLASE" in the last 168 hours. No results for input(s): "AMMONIA" in the last 168 hours. Coagulation Profile: Recent Labs  Lab 12/31/21 1202  INR 1.0   Cardiac Enzymes: No results for input(s): "CKTOTAL", "CKMB", "CKMBINDEX", "TROPONINI" in the last 168 hours. BNP (last 3 results) No results for input(s): "PROBNP"  in the last 8760 hours. HbA1C: No results for input(s): "HGBA1C" in the last 72 hours. CBG: No results for input(s): "GLUCAP" in the last 168 hours. Lipid Profile: No results for input(s): "CHOL", "HDL", "LDLCALC", "TRIG", "CHOLHDL", "LDLDIRECT" in the last 72 hours. Thyroid Function Tests: No results for input(s): "TSH", "T4TOTAL", "FREET4", "T3FREE", "THYROIDAB" in the last 72 hours. Anemia Panel: No results for input(s): "VITAMINB12", "FOLATE", "FERRITIN", "TIBC", "IRON", "RETICCTPCT" in the last 72 hours. Urine analysis:    Component Value Date/Time   COLORURINE STRAW (A) 12/31/2021 1554   APPEARANCEUR CLEAR (A) 12/31/2021 1554   LABSPEC 1.006 12/31/2021 1554   PHURINE 6.0 12/31/2021 1554   GLUCOSEU NEGATIVE 12/31/2021 1554   HGBUR NEGATIVE 12/31/2021 1554   BILIRUBINUR NEGATIVE 12/31/2021 1554   BILIRUBINUR Negative 01/05/2021 1041   KETONESUR NEGATIVE 12/31/2021 1554   PROTEINUR NEGATIVE 12/31/2021 1554   UROBILINOGEN 0.2 01/05/2021 1041   NITRITE NEGATIVE 12/31/2021 1554   LEUKOCYTESUR NEGATIVE 12/31/2021 1554    Radiological Exams on Admission: MR BRAIN WO CONTRAST  Result Date: 12/31/2021 CLINICAL DATA:  Neuro deficit, acute, stroke suspected Right arm/face symptoms now resolved EXAM: MRI HEAD WITHOUT CONTRAST TECHNIQUE: Multiplanar, multiecho pulse sequences of the brain and surrounding structures were obtained without intravenous contrast. COMPARISON:  CT head from the same day. FINDINGS: Brain: Small acute perforator infarct in the left basal ganglia. No significant edema or mass effect. Moderate additional patchy and confluent T2/FLAIR hyperintensities in the white matter, nonspecific but compatible with chronic microvascular ischemic disease. No evidence of acute hemorrhage, mass lesion, midline shift, hydrocephalus, or extra-axial fluid collection. Prominent benign dilated perivascular spaces in bilateral basal ganglia. Vascular: Major arterial flow voids are maintained  at the skull base. Skull and upper cervical spine: Normal marrow signal. Sinuses/Orbits: Minimal paranasal sinus mucosal thickening. No acute orbital findings. Other: Trace left mastoid effusion. IMPRESSION: Small acute  perforator infarct in the left basal ganglia. Electronically Signed   By: Margaretha Sheffield M.D.   On: 12/31/2021 15:40   CT HEAD WO CONTRAST  Result Date: 12/31/2021 CLINICAL DATA:  Neuro deficit, acute, stroke suspected. Right-sided weakness beginning today. EXAM: CT HEAD WITHOUT CONTRAST TECHNIQUE: Contiguous axial images were obtained from the base of the skull through the vertex without intravenous contrast. RADIATION DOSE REDUCTION: This exam was performed according to the departmental dose-optimization program which includes automated exposure control, adjustment of the mA and/or kV according to patient size and/or use of iterative reconstruction technique. COMPARISON:  None FINDINGS: Brain: Age related volume loss. Mild chronic small-vessel ischemic changes of the white matter. No sign of acute infarction, mass lesion, hemorrhage, hydrocephalus or extra-axial collection. Vascular: There is atherosclerotic calcification of the major vessels at the base of the brain. Skull: Negative Sinuses/Orbits: Clear/normal Other: None IMPRESSION: No acute CT finding. Chronic small-vessel ischemic changes of the cerebral hemispheric white matter. Electronically Signed   By: Nelson Chimes M.D.   On: 12/31/2021 12:34      Enzo Bi MD [facility]  If 7PM-7AM, please contact night-coverage 12/31/2021, 5:23 PM

## 2021-12-31 NOTE — ED Notes (Signed)
Pt to [facility]

## 2021-12-31 NOTE — ED Triage Notes (Signed)
Says got up at 3am to go to bathroom and noticed right leg dragging and right arm numbness.  Went to bed normal at 10 pm.  This am says her right arm feels heavy and difficult to make a fist.  Also right leg feels weak as well.

## 2021-12-31 NOTE — ED Provider Notes (Signed)
[facility] Provider Note    Event Date/Time   First MD Initiated Contact with Patient 12/31/21 1431     (approximate)  History   Chief Complaint: Right arm weakness HPI  IDALIZ TINKLE is a 81 y.o. female with a past medical history of arthritis, hypertension, hyperlipidemia, presents to the emergency department for right-sided weakness.  According to the patient around 3 AM early this morning when she went to go the bathroom she noticed that her right leg seem to be somewhat weak and she also noticed some right arm weakness.  Last known normal was yesterday.  Patient states she feels like the right arm feels a little heavy although her symptoms are improving.  Patient states most the weakness is gone but she feels a general sense of fatigue.  Patient states she has had similar symptoms in the past.  Denies any prior strokes.  Physical Exam   Triage Vital Signs: ED Triage Vitals  Enc Vitals Group     BP 12/31/21 1157 (!) 157/70     Pulse Rate 12/31/21 1157 67     Resp 12/31/21 1157 14     Temp 12/31/21 1157 97.8 F (36.6 C)     Temp Source 12/31/21 1157 Oral     SpO2 12/31/21 1157 100 %     Weight 12/31/21 1158 202 lb (91.6 kg)     Height 12/31/21 1158 '5\' 2"'$  (1.575 m)     Head Circumference --      Peak Flow --      Pain Score 12/31/21 1158 0     Pain Loc --      Pain Edu? --      Excl. in [place]? --     Most recent vital signs: Vitals:   12/31/21 1157  BP: (!) 157/70  Pulse: 67  Resp: 14  Temp: 97.8 F (36.6 C)  SpO2: 100%    General: Awake, no distress.  CV:  Good peripheral perfusion.  Regular rate and rhythm  Resp:  Normal effort.  Equal breath sounds bilaterally.  Abd:  No distention.  Soft, nontender.  No rebound or guarding. Other:  Patient has an intact and normal neurological exam on my evaluation.  NIH stroke scale of 0.   ED Results / Procedures / Treatments   EKG  EKG viewed and interpreted by myself shows sinus  bradycardia 56 bpm with a narrow QRS, normal axis, normal intervals, no concerning ST changes.  RADIOLOGY  I reviewed the CT scan images.  No acute bleed seen on my evaluation/interpretation  CT scan of the head is negative for acute abnormality.   MEDICATIONS ORDERED IN ED: Medications - No data to display   IMPRESSION / MDM / [place] / ED COURSE  I reviewed the triage vital signs and the nursing notes.  Patient's presentation is most consistent with acute presentation with potential threat to life or bodily function.  Patient presents to the emergency department for right-sided weakness and generalized fatigue.  Patient states she thinks her right side was weak this morning when going to the restroom.  States she felt like her right arm was somewhat heavy.  Patient states her symptoms are improving/improved but she has a general sense of weakness/fatigue.  States she has felt this way previously at times.  No chest pain.  No shortness of breath no abdominal pain.  Patient has a normal neurological exam on my evaluation.  So far patient has a reassuring  work-up.  CT scan of the head is negative.  EKG is reassuring.  CBC and CMP showed no concerning findings.  Normal neurological exam on my evaluation.  We will proceed with MR imaging of the brain to rule out intracranial abnormality.  If the MRI is negative I believe patient could be discharged home otherwise patient will require admission to the hospital service for ongoing work-up and treatment.  Patient care signed out to oncoming provider.  FINAL CLINICAL IMPRESSION(S) / ED DIAGNOSES   Weakness  Note:  This document was prepared using Dragon voice recognition software and may include unintentional dictation errors.   Harvest Dark, MD 12/31/21 1513

## 2022-01-01 ENCOUNTER — Observation Stay
Admit: 2022-01-01 | Discharge: 2022-01-01 | Disposition: A | Discharge status: Home / Self Care | Payer: Medicare HMO | Attending: Hospitalist | Admitting: Hospitalist

## 2022-01-01 DIAGNOSIS — I10 Essential (primary) hypertension: Secondary | ICD-10-CM | POA: Diagnosis not present

## 2022-01-01 DIAGNOSIS — I639 Cerebral infarction, unspecified: Secondary | ICD-10-CM | POA: Diagnosis not present

## 2022-01-01 LAB — BASIC METABOLIC PANEL
Anion gap: 4 — ABNORMAL LOW (ref 5–15)
BUN: 14 mg/dL (ref 8–23)
CO2: 29 mmol/L (ref 22–32)
Calcium: 8.7 mg/dL — ABNORMAL LOW (ref 8.9–10.3)
Chloride: 109 mmol/L (ref 98–111)
Creatinine, Ser: 0.71 mg/dL (ref 0.44–1.00)
GFR, Estimated: >60 mL/min (ref >60)
Glucose, Bld: 122 mg/dL — ABNORMAL HIGH (ref 70–99)
Potassium: 3.9 mmol/L (ref 3.5–5.1)
Sodium: 142 mmol/L (ref 135–145)

## 2022-01-01 LAB — ECHOCARDIOGRAM COMPLETE
AR max vel: 1.28 cm2
AV Area VTI: 1.08 cm2
AV Area mean vel: 1.14 cm2
AV Mean grad: 5 mmHg
AV Peak grad: 9.1 mmHg
Ao pk vel: 1.51 m/s
Area-P 1/2: 2.69 cm2
Height: 62 in
MV VTI: 1.52 cm2
S' Lateral: 2.28 cm
Weight: 3232 oz

## 2022-01-01 LAB — CBC
HCT: 42.7 % (ref 36.0–46.0)
Hemoglobin: 13.7 g/dL (ref 12.0–15.0)
MCH: 30.2 pg (ref 26.0–34.0)
MCHC: 32.1 g/dL (ref 30.0–36.0)
MCV: 94.1 fL (ref 80.0–100.0)
Platelets: 213 10*3/uL (ref 150–400)
RBC: 4.54 MIL/uL (ref 3.87–5.11)
RDW: 13.4 % (ref 11.5–15.5)
WBC: 6.9 10*3/uL (ref 4.0–10.5)
nRBC: 0 % (ref 0.0–0.2)

## 2022-01-01 LAB — LIPID PANEL
Cholesterol: 165 mg/dL (ref 0–200)
HDL: 46 mg/dL (ref >40)
LDL Cholesterol: 88 mg/dL (ref 0–99)
Total CHOL/HDL Ratio: 3.6 RATIO
Triglycerides: 156 mg/dL — ABNORMAL HIGH (ref <150)
VLDL: 31 mg/dL (ref 0–40)

## 2022-01-01 LAB — MAGNESIUM: Magnesium: 2.3 mg/dL (ref 1.7–2.4)

## 2022-01-01 MED ORDER — CLOPIDOGREL BISULFATE 75 MG PO TABS: 75.0000 mg | ORAL_TABLET | Freq: Every day | ORAL | Status: DC

## 2022-01-01 MED ORDER — CLOPIDOGREL BISULFATE 75 MG PO TABS: 75.0000 mg | ORAL_TABLET | Freq: Every day | ORAL | 0 refills | Status: AC

## 2022-01-01 MED ORDER — AMLODIPINE BESYLATE 5 MG PO TABS: ORAL_TABLET | ORAL | 3 refills | Status: DC

## 2022-01-01 MED ORDER — OXYBUTYNIN 3.9 MG/24HR TD PTTW: 1.0000 | MEDICATED_PATCH | TRANSDERMAL | Status: AC

## 2022-01-01 MED ORDER — ATORVASTATIN CALCIUM 80 MG PO TABS: 80.0000 mg | ORAL_TABLET | Freq: Every day | ORAL | 2 refills | Status: DC

## 2022-01-01 MED ORDER — ASPIRIN 81 MG PO TBEC: 81.0000 mg | DELAYED_RELEASE_TABLET | Freq: Every day | ORAL | Status: DC

## 2022-01-01 MED ORDER — ASPIRIN 81 MG PO TBEC: 81.0000 mg | DELAYED_RELEASE_TABLET | Freq: Every day | ORAL | Status: AC

## 2022-01-01 NOTE — Care Management (Signed)
Patient was discharge, prior to patient being seen by [facility]

## 2022-01-01 NOTE — Progress Notes (Signed)
*  PRELIMINARY RESULTS* Echocardiogram 2D Echocardiogram has been performed.  Cheryl Joseph 01/01/2022, 8:38 AM

## 2022-01-01 NOTE — Therapy (Signed)
Occupational Therapy * Physical Therapy * Speech Therapy          DATE ___09 Jun 2023________________ PATIENT NAME___[name]__________________ PATIENT MRN_______MRN:  [id number]_____________  DIAGNOSIS/DIAGNOSIS CODE _____ Stroke ([place]) I63.9  _________________ DATE OF DISCHARGE: ___09 Jun 2023___________  PRIMARY CARE PHYSICIAN __________ Abner Greenspan, MD       General - Family Medicine    [phone]  _     Dear Provider (Name: __________________   Fax: ___________________________):   I certify that I have examined this patient and that occupational/physical/speech therapy is necessary on an outpatient basis.    The patient has expressed interest in completing their recommended course of therapy at your location.  Once a formal order from the patient's primary care physician has been obtained, please contact him/her to schedule an appointment for evaluation at your earliest convenience.   [ x ]  Physical Therapy Evaluate and Treat          [ x ]  Occupational Therapy Evaluate and Treat                                    [  ]  Speech Therapy Evaluate and Treat       The patient's primary care physician (listed above) must furnish and be responsible for a formal order such that the recommended services may be furnished while under the primary physician's care, and that the plan of care will be established and reviewed every 30 days (or more often if condition necessitates).

## 2022-01-01 NOTE — Evaluation (Signed)
Occupational Therapy Evaluation Patient Details Name: Cheryl Joseph MRN: [id number] DOB: 01/14/1941 Today's Date: 01/01/2022   History of Present Illness Pt is an 81 yo female that presented to the ED for R arm and leg weakness, numbness. MRI showed small acute perforator infarct in the left basal ganglia.   Clinical Impression   Ms. Coghill was seen for OT evaluation this date. Prior to hospital admission, pt was independent in all aspects of ADL/IADL, and denies falls history in past 12 months. Pt lives with her spouse in a 1 story home with 3 steps to enter. Currently pt reporting symptoms have improved, but she continues to have decreased strength and functional use of her RUE. P presents generally near baseline level of ADL management with good safety awareness and awareness of deficits t/o standing grooming task. RUE strength WFL, but decreased as compared to LUE and pt reported baseline  (grossly 4- to 4/5 t/o). Pt endorses mild decreased functional use of her R (non-dominant) UE. Do not anticipate the need for OT while acutely hospitalized. Recommend OP OT to address [facility] and functional RUE concerns.     Recommendations for follow up therapy are one component of a multi-disciplinary discharge planning process, led by the attending physician.  Recommendations may be updated based on patient status, additional functional criteria and insurance authorization.   Follow Up Recommendations  Outpatient OT    Assistance Recommended at Discharge PRN  Patient can return home with the following      Functional Status Assessment     Equipment Recommendations  None recommended by OT    Recommendations for Other Services       Precautions / Restrictions Precautions Precautions: Fall Restrictions Weight Bearing Restrictions: No      Mobility Bed Mobility Overal bed mobility: Independent                  Transfers Overall transfer level: Independent                         Balance Overall balance assessment: No apparent balance deficits (not formally assessed)                                         ADL either performed or assessed with clinical judgement   ADL Overall ADL's : At baseline                                       General ADL Comments: Pt presents generally near baseline level of ADL management with good safety awareness and awareness of deficits t/o standing grooming task. Pt endorses mild decreased functional use of her R (non-dominant) UE.     Vision Baseline Vision/History: 1 Wears glasses Ability to See in Adequate Light: 1 Impaired Patient Visual Report: No change from baseline       Perception     Praxis      Pertinent Vitals/Pain Pain Assessment Pain Assessment: No/denies pain     Hand Dominance Left   Extremity/Trunk Assessment Upper Extremity Assessment Upper Extremity Assessment: LUE deficits/detail LUE Deficits / Details: Overall WFL, pt endorses difficulty with [facility] and decreased shoulder horizontal ADduction/shoulder flexion. 4- to 4/5 t/o shoulder, biceps, tricep, supination, pronation. LUE Sensation: decreased proprioception LUE Coordination: decreased fine motor  Lower Extremity Assessment Lower Extremity Assessment: Defer to PT evaluation;Overall [facility] for tasks assessed   Cervical / Trunk Assessment Cervical / Trunk Assessment: Normal   Communication Communication Communication: No difficulties   Cognition Arousal/Alertness: Awake/alert Behavior During Therapy: WFL for tasks assessed/performed Overall Cognitive Status: Within Functional Limits for tasks assessed                                       General Comments       Exercises Other Exercises Other Exercises: Pt educated on strategies to promote improved functional use of her RUE, BE FAST stroke recognition and response, as well as DC rec for OP OT.   Shoulder Instructions      Home  Living Family/patient expects to be discharged to:: Private residence Living Arrangements: Spouse/significant other Available Help at Discharge: Family;Available 24 hours/day Type of Home: House Home Access: Stairs to enter [employer] of Steps: 3 Entrance Stairs-Rails: Right Home Layout: One level     Bathroom Shower/Tub: Occupational psychologist: Handicapped height Bathroom Accessibility: Yes   Home Equipment: Conservation officer, nature (2 wheels);Shower seat;Grab bars - tub/shower;Grab bars - toilet          Prior Functioning/Environment Prior Level of Function : Independent/Modified Independent             Mobility Comments: Denies falls history          OT Problem List: Decreased coordination;Decreased strength;Impaired UE functional use      OT Treatment/Interventions:      OT Goals(Current goals can be found in the care plan section) Acute Rehab OT Goals Patient Stated Goal: To go home today OT Goal Formulation: All assessment and education complete, DC therapy Time For Goal Achievement: 01/01/22 Potential to Achieve Goals: Good  OT Frequency:      Co-evaluation              AM-PAC OT "6 Clicks" Daily Activity     Outcome Measure Help from another person eating meals?: None Help from another person taking care of personal grooming?: None Help from another person toileting, which includes using toliet, bedpan, or urinal?: None Help from another person bathing (including washing, rinsing, drying)?: None Help from another person to put on and taking off regular upper body clothing?: None Help from another person to put on and taking off regular lower body clothing?: None 6 Click Score: 24   End of Session    Activity Tolerance: Patient tolerated treatment well Patient left: in bed;with call bell/phone within reach;with family/visitor present  OT Visit Diagnosis: Other abnormalities of gait and mobility (R26.89);Hemiplegia and  hemiparesis Hemiplegia - Right/Left: Right Hemiplegia - dominant/non-dominant: Non-Dominant Hemiplegia - caused by: Cerebral infarction                Time: 3546-5681 OT Time Calculation (min): 16 min Charges:  OT General Charges $OT Visit: 1 Visit OT Evaluation $OT Eval Low Complexity: 1 Low  Shara Blazing, M.S., OTR/L Ascom: [phone] 01/01/22, 12:30 PM

## 2022-01-01 NOTE — Evaluation (Signed)
Physical Therapy Evaluation Patient Details Name: Cheryl Joseph MRN: [id number] DOB: 28-Sep-1940 Today's Date: 01/01/2022  History of Present Illness  Pt is an 81 yo female that presented to the ED for R arm and leg weakness, numbness. MRI showed small acute perforator infarct in the left basal ganglia.   Clinical Impression  Patient alert, oriented x4, denied pain. She reported at baseline she is independent, lives with her husband, no recent falls.  The patient demonstrated BLE WFLs, denied any LE sensation deficits. Gross motor WFLs, able to don shoes independently as well as bed mobility, transferred, and ambulated ~28f independently as well. Supervision provided during ambulation with balance assessment. Pt scored 18 on the DGI, indicating an increased risk of falls. The patient would benefit from outpatient PT follow up to address balance deficits and maximize safety.        Recommendations for follow up therapy are one component of a multi-disciplinary discharge planning process, led by the attending physician.  Recommendations may be updated based on patient status, additional functional criteria and insurance authorization.  Follow Up Recommendations Outpatient PT    Assistance Recommended at Discharge None  Patient can return home with the following  Assist for transportation    Equipment Recommendations None recommended by PT  Recommendations for Other Services       Functional Status Assessment Patient has had a recent decline in their functional status and demonstrates the ability to make significant improvements in function in a reasonable and predictable amount of time.     Precautions / Restrictions Precautions Precautions: Fall Restrictions Weight Bearing Restrictions: No      Mobility  Bed Mobility Overal bed mobility: Independent                  Transfers Overall transfer level: Independent                       Ambulation/Gait Ambulation/Gait assistance: Supervision, Independent Gait Distance (Feet): 180 Feet Assistive device: None         General Gait Details: wide base of support, no true LOB noted  Stairs            Wheelchair Mobility    Modified Rankin (Stroke Patients Only)       Balance Overall balance assessment: Needs assistance Sitting-balance support: Feet supported Sitting balance-Leahy Scale: Normal       Standing balance-Leahy Scale: Good                   Standardized Balance Assessment Standardized Balance Assessment : Dynamic Gait Index   Dynamic Gait Index Level Surface: Normal Change in Gait Speed: Normal Gait with Horizontal Head Turns: Mild Impairment Gait with Vertical Head Turns: Mild Impairment Gait and Pivot Turn: Mild Impairment Step Over Obstacle: Mild Impairment Step Around Obstacles: Mild Impairment Steps: Mild Impairment (clinical judgement) Total Score: 18       Pertinent Vitals/Pain Pain Assessment Pain Assessment: No/denies pain    Home Living Family/patient expects to be discharged to:: Private residence Living Arrangements: Spouse/significant other Available Help at Discharge: Family;Available 24 hours/day Type of Home: House Home Access: Stairs to enter Entrance Stairs-Rails: Right Entrance Stairs-Number of Steps: 3   Home Layout: One level Home Equipment: RConservation officer, nature(2 wheels);Shower seat;Grab bars - tub/shower;Grab bars - toilet      Prior Function Prior Level of Function : Independent/Modified Independent  Hand Dominance   Dominant Hand: Left    Extremity/Trunk Assessment   Upper Extremity Assessment Upper Extremity Assessment: Defer to OT evaluation (some weakness of LUE noted, shoulder flexion/abduction)    Lower Extremity Assessment Lower Extremity Assessment: Overall WFL for tasks assessed    Cervical / Trunk Assessment Cervical / Trunk Assessment: Normal   Communication   Communication: No difficulties  Cognition Arousal/Alertness: Awake/alert Behavior During Therapy: WFL for tasks assessed/performed Overall Cognitive Status: Within Functional Limits for tasks assessed                                          General Comments      Exercises     Assessment/Plan    PT Assessment Patient needs continued PT services  PT Problem List         PT Treatment Interventions DME instruction;Therapeutic exercise;Gait training;Balance training;Stair training;Neuromuscular re-education;Functional mobility training;Therapeutic activities;Patient/family education    PT Goals (Current goals can be found in the Care Plan section)  Acute Rehab PT Goals Patient Stated Goal: to go home PT Goal Formulation: With patient Time For Goal Achievement: 01/15/22 Potential to Achieve Goals: Good Additional Goals Additional Goal #1: The patient will be able to ambulate independently >1023f with the  6MWT indicating unlimited community ambulator    Frequency Min 2X/week     Co-evaluation               AM-PAC PT "6 Clicks" Mobility  Outcome Measure Help needed turning from your back to your side while in a flat bed without using bedrails?: None Help needed moving from lying on your back to sitting on the side of a flat bed without using bedrails?: None Help needed moving to and from a bed to a chair (including a wheelchair)?: None Help needed standing up from a chair using your arms (e.g., wheelchair or bedside chair)?: None Help needed to walk in hospital room?: None Help needed climbing 3-5 steps with a railing? : None 6 Click Score: 24    End of Session Equipment Utilized During Treatment: Gait belt Activity Tolerance: Patient tolerated treatment well Patient left: with call bell/phone within reach;in bed;with family/visitor present Nurse Communication: Mobility status PT Visit Diagnosis: Other abnormalities of gait and  mobility (R26.89)    Time: 06578-4696PT Time Calculation (min) (ACUTE ONLY): 14 min   Charges:   PT Evaluation $PT Eval Low Complexity: 1 Low PT Treatments $Therapeutic Activity: 8-22 mins        DLieutenant DiegoPT, DPT 11:58 AM,01/01/22

## 2022-01-01 NOTE — Plan of Care (Signed)
Neurology plan of care  This is a 81 yo woman with hx HTN, HL who presented with R sided weakness and numbness x12 hrs and was found to have small acute ischemic infarct in L basal ganglia on MRI. CTA H&N no hemodynamically-signficiant stenosis. Was not on antiplatelet prior to admission. TTE pending.  - Stroke workup complete with exception of TTE results. If TTE shows no intracardiac clot or other significant abnl, OK to discharge on ASA '81mg'$  daily + plavix '75mg'$  daily x21 days f/b ASA '81mg'$  daily monotherapy after that - Atorvastatin '80mg'$  daily - Since stroke workup was complete prior to neurology consultation, patient does not need formal consult prior to discharge unless there are additional neurologic concerns - Permissive HTN x48 hrs after stroke, after that goal is normotension. Patient has been [id number] systolic overnight and we certainly don't want her lower than this if she goes home today. Consider holding amlodipine at hospital discharge with plan to f/u with PCP within one week for vitals check and restarting antihypertensives - I will arrange outpatient neuro f/u  Please page neurology with any additional questions.  Su Monks, MD [facility] [phone]  If 7pm- 7am, please page neurology on call as listed in [place].

## 2022-01-01 NOTE — Discharge Summary (Signed)
Physician Discharge Summary   Cheryl Joseph  female DOB: 08-13-1940  DXA:[id number]  PCP: Abner Greenspan, MD  Admit date: 12/31/2021 Discharge date: 01/01/2022  Admitted From: home Disposition:  home CODE STATUS: Full code  Discharge Instructions     Ambulatory referral to Neurology   Complete by: As directed    An appointment is requested in approximately: 6 wks   Discharge instructions   Complete by: As directed    You had a small stroke.  Neurologist recommended taking Aspirin 81 mg and Plavix 75 mg daily together for 21 days, then after that, just Aspirin by itself indefinitely.  We also upgraded your cholesterol medication to Lipitor 80 mg daily  Neurologist wants your blood pressure to run higher after a stroke, so please hold your amlodipine until followup with your PCP.   Dr. Enzo Bi [facility] Course:  For full details, please see H&P, progress notes, consult notes and ancillary notes.  Briefly,  Cheryl Joseph is a 81 y.o. female with medical history significant of HTN, HLD, hypertriglyceridemia, and asthma who presented with right leg weakness and right arm numbness.  # Acute stroke --MRI brain showed Small acute perforator infarct in the left basal ganglia.   --LDL 95 in March 2023.  Takes simvastatin 40 mg daily at home.  Not on ASA PTA. --CTA head/neck without significant stenosis.  Echo neg for intra-cardiac thrombus or shunt. --increase statin to Lipitor 80 mg daily --Per neuro, Aspirin 81 mg and Plavix 75 mg daily together for 21 days, then after that, just Aspirin alone. --outpatient neuro referral   # HTN --Per neuro rec, continue to hold home amlodipine after discharge for permissive HTN.  May resume at outpatient f/u.   # Asthma --stable   Discharge Diagnoses:  Principal Problem:   Stroke [facility])     Discharge Instructions:  Allergies as of 01/01/2022       Reactions   Tolterodine Tartrate Other (See Comments)   Unknown  (back ache)   Vesicare [solifenacin Succinate]    Eye problems         Medication List     STOP taking these medications    ondansetron 4 MG disintegrating tablet Commonly known as: ZOFRAN-ODT   simvastatin 40 MG tablet Commonly known as: ZOCOR       TAKE these medications    acetaminophen 500 MG tablet Commonly known as: TYLENOL Take 1,000 mg by mouth every 6 (six) hours as needed for mild pain.   albuterol 108 (90 Base) MCG/ACT inhaler Commonly known as: VENTOLIN HFA INHALE TWO PUFFS BY MOUTH EVERY 4 HOURS AS NEEDED FOR WHEEZING AND 2 PUFFS BEFORE EXERCISE OR EXPOSURE TO COLD AIR   amLODipine 5 MG tablet Commonly known as: NORVASC Hold until followup with PCP. What changed:  how much to take how to take this when to take this additional instructions   aspirin EC 81 MG tablet Take 1 tablet (81 mg total) by mouth daily. Swallow whole.   atorvastatin 80 MG tablet Commonly known as: LIPITOR Take 1 tablet (80 mg total) by mouth daily.   AZO BLADDER CONTROL/GO-LESS PO Take 1 capsule by mouth at bedtime.   b complex vitamins tablet Take 1 tablet by mouth every Monday, Wednesday, and Friday.   calcium carbonate 1250 (500 Ca) MG tablet Commonly known as: OS-CAL - dosed in mg of elemental calcium Take 2 tablets by mouth daily with breakfast.   clopidogrel 75  MG tablet Commonly known as: PLAVIX Take 1 tablet (75 mg total) by mouth daily for 21 days.   FISH OIL PO Take 1 capsule by mouth daily. Reported on 12/18/2015   Fluzone High-Dose Quadrivalent 0.7 ML Susy Generic drug: Influenza Vac High-Dose Quad   HYALURONIC ACID PO Take 1 tablet by mouth every Monday, Wednesday, and Friday.   montelukast 10 MG tablet Commonly known as: SINGULAIR Take 1 tablet (10 mg total) by mouth at bedtime.   multivitamin capsule Take 1 capsule by mouth daily. Reported on 12/18/2015   OVER THE COUNTER MEDICATION Take 2 capsules by mouth daily. BONE UP   oxybutynin 3.9  MG/24HR Commonly known as: OXYTROL Place 1 patch onto the skin once a week. Sunday.  Home med. What changed:  when to take this additional instructions   PROBIOTIC DAILY PO Take 1 tablet by mouth daily.   psyllium 28 % packet Commonly known as: METAMUCIL SMOOTH TEXTURE Take 1 packet by mouth daily.   TURMERIC PO Take 720 mg by mouth every Monday, Wednesday, and Friday.   Ubiquinol 100 MG Caps Take 1 capsule by mouth every Monday, Wednesday, and Friday.   VITAMIN D3 PO Take 2 tablets by mouth daily.         Follow-up Information     Tower, Wynelle Fanny, MD Follow up in 1 week(s).   Specialties: Family Medicine, Radiology Contact information: [place] [address] [phone]                 Allergies  Allergen Reactions   Tolterodine Tartrate Other (See Comments)    Unknown (back ache)   Vesicare [Solifenacin Succinate]     Eye problems      The results of significant diagnostics from this hospitalization (including imaging, microbiology, ancillary and laboratory) are listed below for reference.   Consultations:   Procedures/Studies: CT ANGIO HEAD NECK W WO CM  Result Date: 12/31/2021 CLINICAL DATA:  Right-sided neurologic deficits EXAM: CT ANGIOGRAPHY HEAD AND NECK TECHNIQUE: Multidetector CT imaging of the head and neck was performed using the standard protocol during bolus administration of intravenous contrast. Multiplanar CT image reconstructions and MIPs were obtained to evaluate the vascular anatomy. Carotid stenosis measurements (when applicable) are obtained utilizing NASCET criteria, using the distal internal carotid diameter as the denominator. RADIATION DOSE REDUCTION: This exam was performed according to the departmental dose-optimization program which includes automated exposure control, adjustment of the mA and/or kV according to patient size and/or use of iterative reconstruction technique. CONTRAST:  59m OMNIPAQUE  IOHEXOL 350 MG/ML SOLN COMPARISON:  None Available. FINDINGS: CTA NECK FINDINGS SKELETON: There is no bony spinal canal stenosis. No lytic or blastic lesion. OTHER NECK: Normal pharynx, larynx and major salivary glands. No cervical lymphadenopathy. Unremarkable thyroid gland. UPPER CHEST: No pneumothorax or pleural effusion. No nodules or masses. AORTIC ARCH: There is calcific atherosclerosis of the aortic arch. There is no aneurysm, dissection or hemodynamically significant stenosis of the visualized portion of the aorta. Conventional 3 vessel aortic branching pattern. The visualized proximal subclavian arteries are widely patent. RIGHT CAROTID SYSTEM: Normal without aneurysm, dissection or stenosis. LEFT CAROTID SYSTEM: Normal without aneurysm, dissection or stenosis. VERTEBRAL ARTERIES: Left dominant configuration. Both origins are clearly patent. There is no dissection, occlusion or flow-limiting stenosis to the skull base (V1-V3 segments). CTA HEAD FINDINGS POSTERIOR CIRCULATION: --Vertebral arteries: Normal V4 segments. --Inferior cerebellar arteries: Normal. --Basilar artery: Normal. --Superior cerebellar arteries: Normal. --Posterior cerebral arteries (PCA): Normal. ANTERIOR CIRCULATION: --  Intracranial internal carotid arteries: Atherosclerotic calcification of the internal carotid arteries at the skull base without hemodynamically significant stenosis. --Anterior cerebral arteries (ACA): Normal. Both A1 segments are present. Patent anterior communicating artery (a-comm). --Middle cerebral arteries (MCA): Normal. VENOUS SINUSES: As permitted by contrast timing, patent. ANATOMIC VARIANTS: None Review of the MIP images confirms the above findings. IMPRESSION: 1. No emergent large vessel occlusion or high-grade stenosis of the intracranial or cervical arteries. 2. Aortic Atherosclerosis (ICD10-I70.0). Electronically Signed   By: Ulyses Jarred M.D.   On: 12/31/2021 19:26   MR BRAIN WO CONTRAST  Result Date:  12/31/2021 CLINICAL DATA:  Neuro deficit, acute, stroke suspected Right arm/face symptoms now resolved EXAM: MRI HEAD WITHOUT CONTRAST TECHNIQUE: Multiplanar, multiecho pulse sequences of the brain and surrounding structures were obtained without intravenous contrast. COMPARISON:  CT head from the same day. FINDINGS: Brain: Small acute perforator infarct in the left basal ganglia. No significant edema or mass effect. Moderate additional patchy and confluent T2/FLAIR hyperintensities in the white matter, nonspecific but compatible with chronic microvascular ischemic disease. No evidence of acute hemorrhage, mass lesion, midline shift, hydrocephalus, or extra-axial fluid collection. Prominent benign dilated perivascular spaces in bilateral basal ganglia. Vascular: Major arterial flow voids are maintained at the skull base. Skull and upper cervical spine: Normal marrow signal. Sinuses/Orbits: Minimal paranasal sinus mucosal thickening. No acute orbital findings. Other: Trace left mastoid effusion. IMPRESSION: Small acute perforator infarct in the left basal ganglia. Electronically Signed   By: Margaretha Sheffield M.D.   On: 12/31/2021 15:40   CT HEAD WO CONTRAST  Result Date: 12/31/2021 CLINICAL DATA:  Neuro deficit, acute, stroke suspected. Right-sided weakness beginning today. EXAM: CT HEAD WITHOUT CONTRAST TECHNIQUE: Contiguous axial images were obtained from the base of the skull through the vertex without intravenous contrast. RADIATION DOSE REDUCTION: This exam was performed according to the departmental dose-optimization program which includes automated exposure control, adjustment of the mA and/or kV according to patient size and/or use of iterative reconstruction technique. COMPARISON:  None FINDINGS: Brain: Age related volume loss. Mild chronic small-vessel ischemic changes of the white matter. No sign of acute infarction, mass lesion, hemorrhage, hydrocephalus or extra-axial collection. Vascular: There is  atherosclerotic calcification of the major vessels at the base of the brain. Skull: Negative Sinuses/Orbits: Clear/normal Other: None IMPRESSION: No acute CT finding. Chronic small-vessel ischemic changes of the cerebral hemispheric white matter. Electronically Signed   By: Nelson Chimes M.D.   On: 12/31/2021 12:34      Labs: BNP (last 3 results) No results for input(s): "BNP" in the last 8760 hours. Basic Metabolic Panel: Recent Labs  Lab 12/31/21 1202 01/01/22 0553  NA 141 142  K 4.0 3.9  CL 111 109  CO2 26 29  GLUCOSE 121* 122*  BUN 18 14  CREATININE 0.68 0.71  CALCIUM 9.1 8.7*  MG  --  2.3   Liver Function Tests: Recent Labs  Lab 12/31/21 1202  AST 21  ALT 13  ALKPHOS 74  BILITOT 0.3  PROT 7.0  ALBUMIN 3.7   No results for input(s): "LIPASE", "AMYLASE" in the last 168 hours. No results for input(s): "AMMONIA" in the last 168 hours. CBC: Recent Labs  Lab 12/31/21 1202 01/01/22 0553  WBC 7.1 6.9  NEUTROABS 4.4  --   HGB 15.1* 13.7  HCT 46.4* 42.7  MCV 94.7 94.1  PLT 235 213   Cardiac Enzymes: No results for input(s): "CKTOTAL", "CKMB", "CKMBINDEX", "TROPONINI" in the last 168 hours. BNP: Invalid input(s): "POCBNP"  CBG: No results for input(s): "GLUCAP" in the last 168 hours. D-Dimer No results for input(s): "DDIMER" in the last 72 hours. Hgb A1c No results for input(s): "HGBA1C" in the last 72 hours. Lipid Profile Recent Labs    01/01/22 0553  CHOL 165  HDL 46  LDLCALC 88  TRIG 156*  CHOLHDL 3.6   Thyroid function studies No results for input(s): "TSH", "T4TOTAL", "T3FREE", "THYROIDAB" in the last 72 hours.  Invalid input(s): "FREET3" Anemia work up No results for input(s): "VITAMINB12", "FOLATE", "FERRITIN", "TIBC", "IRON", "RETICCTPCT" in the last 72 hours. Urinalysis    Component Value Date/Time   COLORURINE STRAW (A) 12/31/2021 1554   APPEARANCEUR CLEAR (A) 12/31/2021 1554   LABSPEC 1.006 12/31/2021 1554   PHURINE 6.0 12/31/2021 1554    GLUCOSEU NEGATIVE 12/31/2021 1554   HGBUR NEGATIVE 12/31/2021 1554   BILIRUBINUR NEGATIVE 12/31/2021 1554   BILIRUBINUR Negative 01/05/2021 1041   KETONESUR NEGATIVE 12/31/2021 1554   PROTEINUR NEGATIVE 12/31/2021 1554   UROBILINOGEN 0.2 01/05/2021 1041   NITRITE NEGATIVE 12/31/2021 1554   LEUKOCYTESUR NEGATIVE 12/31/2021 1554   Sepsis Labs Recent Labs  Lab 12/31/21 1202 01/01/22 0553  WBC 7.1 6.9   Microbiology No results found for this or any previous visit (from the past 240 hour(s)).   Total time spend on discharging this patient, including the last patient exam, discussing the hospital stay, instructions for ongoing care as it relates to all pertinent caregivers, as well as preparing the medical discharge records, prescriptions, and/or referrals as applicable, is 35 minutes.    Enzo Bi, MD  [facility] 01/01/2022, 9:34 AM

## 2022-01-05 ENCOUNTER — Ambulatory Visit
Admission: RE | Admit: 2022-01-05 | Discharge: 2022-01-05 | Disposition: A | Discharge status: Home / Self Care | Payer: Medicare HMO | Source: Ambulatory Visit | Attending: Family Medicine | Admitting: Family Medicine

## 2022-01-05 DIAGNOSIS — Z1231 Encounter for screening mammogram for malignant neoplasm of breast: Secondary | ICD-10-CM | POA: Diagnosis not present

## 2022-01-06 ENCOUNTER — Encounter: Payer: Self-pay | Admitting: Family Medicine

## 2022-01-06 ENCOUNTER — Ambulatory Visit (INDEPENDENT_AMBULATORY_CARE_PROVIDER_SITE_OTHER): Payer: Medicare HMO | Admitting: Family Medicine

## 2022-01-06 VITALS — BP 142/78 | HR 61 | Temp 98.2°F | Resp 16 | Ht 62.0 in | Wt 201.5 lb

## 2022-01-06 DIAGNOSIS — Z8673 Personal history of transient ischemic attack (TIA), and cerebral infarction without residual deficits: Secondary | ICD-10-CM | POA: Diagnosis not present

## 2022-01-06 DIAGNOSIS — E785 Hyperlipidemia, unspecified: Secondary | ICD-10-CM

## 2022-01-06 DIAGNOSIS — I10 Essential (primary) hypertension: Secondary | ICD-10-CM

## 2022-01-06 DIAGNOSIS — R7303 Prediabetes: Secondary | ICD-10-CM | POA: Diagnosis not present

## 2022-01-06 NOTE — Assessment & Plan Note (Signed)
Recent hospitalization  Reviewed hospital records, lab results and studies in detail  Noted small infarct in L basal ganglia  No source found (carotid or cardiac) Asa 81 mg dna plavix 75 mg daily for 21 days then just asa alone  Ref to neuro/pt would like to see Dr Manuella Ghazi ER precautions disc in detail  Reassuring exam Planning PT/OT to help R hand dexterity  Doing very well  Disc diet low in processed foods

## 2022-01-06 NOTE — Assessment & Plan Note (Signed)
Will start back on amlodipine 5 mg daily now that she is stable after hosp for CVA BP: (!) 142/78    Good health habits  Her home cuff ran high today/ she plans to get a new one

## 2022-01-06 NOTE — Progress Notes (Signed)
Subjective:    Patient ID: Cheryl Joseph, female    DOB: October 30, 1940, 81 y.o.   MRN: [id number]  HPI Pt presents for f/u of hospitalization 6/8-01/01/22 for CVA  Wt Readings from Last 3 Encounters:  01/06/22 201 lb 8 oz (91.4 kg)  12/31/21 202 lb (91.6 kg)  11/02/21 205 lb (93 kg)   36.85 kg/m   She presented with R leg weakness and R arm numbness Could not hold her brush    [facility] course:  Hospital Course:  For full details, please see H&P, progress notes, consult notes and ancillary notes.  Briefly,  Cheryl Joseph is a 81 y.o. female with medical history significant of HTN, HLD, hypertriglyceridemia, and asthma who presented with right leg weakness and right arm numbness.    # Acute stroke --MRI brain showed Small acute perforator infarct in the left basal ganglia.   --LDL 95 in March 2023.  Takes simvastatin 40 mg daily at home.  Not on ASA PTA. --CTA head/neck without significant stenosis.  Echo neg for intra-cardiac thrombus or shunt. --increase statin to Lipitor 80 mg daily --Per neuro, Aspirin 81 mg and Plavix 75 mg daily together for 21 days, then after that, just Aspirin alone. --outpatient neuro referral  Per pt - is interested in PT-waiting for opening in the hospital  Occ word finding Leans a little to the R with walking  Working with her R hand (she is L handed)   Very slight headache ever since  Not bad at all     Had a referral to neurology  She does not want to go to [facility]  Is interested in Dr Manuella Ghazi at [facility]   # HTN --Per neuro rec, continue to hold home amlodipine after discharge for permissive HTN.  May resume at outpatient f/u.  BP Readings from Last 3 Encounters:  01/06/22 (!) 142/78  01/01/22 (!) 119/59  11/02/21 126/68   Pulse Readings from Last 3 Encounters:  01/06/22 61  01/01/22 66  11/02/21 64   Need to go back to amlodipine 5 mg  Has not taken bp since coming from hospital    # Asthma --stable     Discharge Diagnoses:   Principal Problem:   Stroke [facility])   Lab Results  Component Value Date   CREATININE 0.71 01/01/2022   BUN 14 01/01/2022   NA 142 01/01/2022   K 3.9 01/01/2022   CL 109 01/01/2022   CO2 29 01/01/2022   Lab Results  Component Value Date   WBC 6.9 01/01/2022   HGB 13.7 01/01/2022   HCT 42.7 01/01/2022   MCV 94.1 01/01/2022   PLT 213 01/01/2022   Lab Results  Component Value Date   INR 1.0 12/31/2021   Lab Results  Component Value Date   CHOL 165 01/01/2022   HDL 46 01/01/2022   LDLCALC 88 01/01/2022   LDLDIRECT 97.0 07/06/2019   TRIG 156 (H) 01/01/2022   CHOLHDL 3.6 01/01/2022   Lab Results  Component Value Date   HGBA1C 6.4 10/21/2021   Prediabetes  Lab Results  Component Value Date   ALT 13 12/31/2021   AST 21 12/31/2021   ALKPHOS 74 12/31/2021   BILITOT 0.3 12/31/2021   Patient Active Problem List   Diagnosis Date Noted   History of stroke 03/08/4817   Umbilical hernia 56/31/4970   Frequent stools 01/04/2020   Pre-operative general physical examination 07/06/2019   Lower abdominal pain 08/16/2018   Stress reaction 09/05/2017   Estrogen deficiency  05/25/2016   Routine general medical examination at a health care facility 11/23/2015   Hammertoe 10/28/2015   Frequent loose stools 02/14/2015   Diverticulosis of colon without hemorrhage 02/14/2015   Colon cancer screening 08/19/2014   Essential hypertension 02/25/2014   Encounter for Medicare annual wellness exam 07/16/2013   Prediabetes 05/14/2009   Rheumatoid arthritis with positive rheumatoid factor ([place]) 05/13/2008   Osteopenia 03/07/2007   Hyperlipidemia 01/03/2007   Obesity (BMI 30-39.9) 01/03/2007   ALLERGIC RHINITIS, SEASONAL 01/03/2007   ASTHMA 01/03/2007   OVERACTIVE BLADDER 01/03/2007   Past Medical History:  Diagnosis Date   Anginal pain (HCC)    Arthritis    RA hands(seronegative)   Asthma    Back pain    Cancer ([place])    skin   Cataract    HPV in female 1996   HPV with  colposcopy (all neg paps since)   Hyperlipidemia    Hypertension    Incarcerated ventral hernia    Obesity    Osteopenia    Pre-diabetes    Shortness of breath dyspnea    Shoulder pain    Past Surgical History:  Procedure Laterality Date   CATARACT EXTRACTION, BILATERAL Bilateral    COLONOSCOPY  06/01/2004   COLONOSCOPY WITH PROPOFOL N/A 05/01/2015   Procedure: COLONOSCOPY WITH PROPOFOL;  Surgeon: Cheryl Silvas, MD;  Location: [place];  Service: Endoscopy;  Laterality: N/A;   CORRECTION HAMMER TOE Right    2nd toe   JOINT REPLACEMENT Right    right knee replacement January 2021   SQUAMOUS CELL CARCINOMA EXCISION Left 12/2017   left arm   TONSILLECTOMY     VAGINAL HYSTERECTOMY  2015   with pelvic organ prolapse surgery    XI ROBOTIC ASSISTED VENTRAL HERNIA N/A 06/10/2021   Procedure: XI ROBOTIC ASSISTED VENTRAL HERNIA;  Surgeon: Cheryl Pun, MD;  Location: [facility];  Service: General;  Laterality: N/A;   Social History   Tobacco Use   Smoking status: Never   Smokeless tobacco: Never  Vaping Use   Vaping Use: Never used  Substance Use Topics   Alcohol use: No    Alcohol/week: 0.0 standard drinks of alcohol   Drug use: No   Family History  Problem Relation Age of Onset   Diabetes Mother    Hyperlipidemia Mother    Heart disease Mother    Hypertension Mother    Obesity Mother    Kyphosis Mother    Heart disease Father    Hypertension Father    Cancer Father        lung Cancer smoker   Diabetes Daughter    Kyphosis Sister    Breast cancer Neg Hx    Allergies  Allergen Reactions   Tolterodine Tartrate Other (See Comments)    Unknown (back ache)   Vesicare [Solifenacin Succinate]     Eye problems    Current Outpatient Medications on File Prior to Visit  Medication Sig Dispense Refill   acetaminophen (TYLENOL) 500 MG tablet Take 1,000 mg by mouth every 6 (six) hours as needed for mild pain.     albuterol (VENTOLIN HFA) 108 (90 Base)  MCG/ACT inhaler INHALE TWO PUFFS BY MOUTH EVERY 4 HOURS AS NEEDED FOR WHEEZING AND 2 PUFFS BEFORE EXERCISE OR EXPOSURE TO COLD AIR 18 each 2   amLODipine (NORVASC) 5 MG tablet Hold until followup with PCP. 90 tablet 3   aspirin EC 81 MG tablet Take 1 tablet (81 mg total) by mouth daily. Swallow whole.  atorvastatin (LIPITOR) 80 MG tablet Take 1 tablet (80 mg total) by mouth daily. 30 tablet 2   b complex vitamins tablet Take 1 tablet by mouth every Monday, Wednesday, and Friday.     calcium carbonate (OS-CAL - DOSED IN MG OF ELEMENTAL CALCIUM) 1250 (500 Ca) MG tablet Take 2 tablets by mouth daily with breakfast.     Cholecalciferol (VITAMIN D3 PO) Take 2 tablets by mouth daily.     clopidogrel (PLAVIX) 75 MG tablet Take 1 tablet (75 mg total) by mouth daily for 21 days. 21 tablet 0   FLUZONE HIGH-DOSE QUADRIVALENT 0.7 ML SUSY      Hyaluronic Acid-Vitamin C (HYALURONIC ACID PO) Take 1 tablet by mouth every Monday, Wednesday, and Friday.     montelukast (SINGULAIR) 10 MG tablet Take 1 tablet (10 mg total) by mouth at bedtime. 90 tablet 3   Multiple Vitamin (MULTIVITAMIN) capsule Take 1 capsule by mouth daily. Reported on 12/18/2015     Omega-3 Fatty Acids (FISH OIL PO) Take 1 capsule by mouth daily. Reported on 12/18/2015     OVER THE COUNTER MEDICATION Take 2 capsules by mouth daily. BONE UP     oxybutynin (OXYTROL) 3.9 MG/24HR Place 1 patch onto the skin once a week. Sunday.  Home med.     Probiotic Product (PROBIOTIC DAILY PO) Take 1 tablet by mouth daily.     psyllium (METAMUCIL SMOOTH TEXTURE) 28 % packet Take 1 packet by mouth daily.     Pumpkin Seed-Soy Germ (AZO BLADDER CONTROL/GO-LESS PO) Take 1 capsule by mouth at bedtime.     TURMERIC PO Take 720 mg by mouth every Monday, Wednesday, and Friday.     Ubiquinol 100 MG CAPS Take 1 capsule by mouth every Monday, Wednesday, and Friday.     No current facility-administered medications on file prior to visit.     Review of Systems   Constitutional:  Negative for activity change, appetite change, fatigue, fever and unexpected weight change.  HENT:  Negative for congestion, ear pain, rhinorrhea, sinus pressure and sore throat.   Eyes:  Negative for pain, redness and visual disturbance.  Respiratory:  Negative for cough, shortness of breath and wheezing.   Cardiovascular:  Negative for chest pain and palpitations.  Gastrointestinal:  Negative for abdominal pain, blood in stool, constipation and diarrhea.  Endocrine: Negative for polydipsia and polyuria.  Genitourinary:  Negative for dysuria, frequency and urgency.  Musculoskeletal:  Negative for arthralgias, back pain and myalgias.  Skin:  Negative for pallor and rash.  Allergic/Immunologic: Negative for environmental allergies.  Neurological:  Positive for weakness and headaches. Negative for dizziness, syncope and facial asymmetry.  Hematological:  Negative for adenopathy. Does not bruise/bleed easily.  Psychiatric/Behavioral:  Negative for decreased concentration and dysphoric mood. The patient is not nervous/anxious.        Objective:   Physical Exam Constitutional:      General: She is not in acute distress.    Appearance: Normal appearance. She is well-developed. She is obese. She is not ill-appearing or diaphoretic.  HENT:     Head: Normocephalic and atraumatic.  Eyes:     Conjunctiva/sclera: Conjunctivae normal.     Pupils: Pupils are equal, round, and reactive to light.  Neck:     Thyroid: No thyromegaly.     Vascular: No carotid bruit or JVD.  Cardiovascular:     Rate and Rhythm: Normal rate and regular rhythm.     Heart sounds: Normal heart sounds.  No gallop.  Pulmonary:     Effort: Pulmonary effort is normal. No respiratory distress.     Breath sounds: Normal breath sounds. No wheezing or rales.  Abdominal:     General: There is no distension or abdominal bruit.     Palpations: Abdomen is soft.  Musculoskeletal:     Cervical back: Normal  range of motion and neck supple.     Right lower leg: No edema.     Left lower leg: No edema.  Lymphadenopathy:     Cervical: No cervical adenopathy.  Skin:    General: Skin is warm and dry.     Coloration: Skin is not pale.     Findings: No rash.  Neurological:     Mental Status: She is alert.     Cranial Nerves: No cranial nerve deficit.     Coordination: Coordination normal.     Gait: Gait normal.     Deep Tendon Reflexes: Reflexes are normal and symmetric. Reflexes normal.     Comments: L hand is more clumsy than R but her grip is almost symmetric Speech is close to baseline Nl gait   Psychiatric:        Mood and Affect: Mood normal.           Assessment & Plan:   Problem List Items Addressed This Visit       Cardiovascular and Mediastinum   Essential hypertension    Will start back on amlodipine 5 mg daily now that she is stable after hosp for CVA BP: (!) 142/78    Good health habits  Her home cuff ran high today/ she plans to get a new one        Other   History of stroke - Primary    Recent hospitalization  Reviewed hospital records, lab results and studies in detail  Noted small infarct in L basal ganglia  No source found (carotid or cardiac) Asa 81 mg dna plavix 75 mg daily for 21 days then just asa alone  Ref to neuro/pt would like to see Dr Manuella Ghazi ER precautions disc in detail  Reassuring exam Planning PT/OT to help R hand dexterity  Doing very well  Disc diet low in processed foods       Relevant Orders   Ambulatory referral to Neurology   Hyperlipidemia    With new h/o CVA Disc goals for lipids and reasons to control them Rev last labs with pt Rev low sat fat diet in detail  Was changed to atorvastatin 80 mg daily  Tolerating so far Re check fasting lipid in a month      Relevant Orders   ALT   AST   Lipid panel   Prediabetes    Lab Results  Component Value Date   HGBA1C 6.4 10/21/2021  disc imp of low glycemic diet and wt loss  to prevent DM2

## 2022-01-06 NOTE — Assessment & Plan Note (Signed)
With new h/o CVA Disc goals for lipids and reasons to control them Rev last labs with pt Rev low sat fat diet in detail  Was changed to atorvastatin 80 mg daily  Tolerating so far Re check fasting lipid in a month

## 2022-01-06 NOTE — Patient Instructions (Addendum)
Please let us know if you don't hear from our referral coordinator in a week   You can start calling Dr Trena Platt office to make the appt as well   If symptoms return-call 911 If headache worsens or does not improve please let us know   Eat healthy  Take care of yourself   Schedule fasting labs in a month for cholesterol

## 2022-01-06 NOTE — Assessment & Plan Note (Signed)
Lab Results  Component Value Date   HGBA1C 6.4 10/21/2021   disc imp of low glycemic diet and wt loss to prevent DM2

## 2022-01-13 DIAGNOSIS — I10 Essential (primary) hypertension: Secondary | ICD-10-CM | POA: Diagnosis not present

## 2022-01-13 DIAGNOSIS — R4189 Other symptoms and signs involving cognitive functions and awareness: Secondary | ICD-10-CM | POA: Diagnosis not present

## 2022-01-13 DIAGNOSIS — E785 Hyperlipidemia, unspecified: Secondary | ICD-10-CM | POA: Diagnosis not present

## 2022-01-13 DIAGNOSIS — Z8673 Personal history of transient ischemic attack (TIA), and cerebral infarction without residual deficits: Secondary | ICD-10-CM | POA: Diagnosis not present

## 2022-01-18 ENCOUNTER — Ambulatory Visit (INDEPENDENT_AMBULATORY_CARE_PROVIDER_SITE_OTHER): Payer: Medicare HMO

## 2022-01-18 DIAGNOSIS — I10 Essential (primary) hypertension: Secondary | ICD-10-CM

## 2022-01-18 DIAGNOSIS — Z8673 Personal history of transient ischemic attack (TIA), and cerebral infarction without residual deficits: Secondary | ICD-10-CM

## 2022-01-18 DIAGNOSIS — E785 Hyperlipidemia, unspecified: Secondary | ICD-10-CM

## 2022-01-18 DIAGNOSIS — K5730 Diverticulosis of large intestine without perforation or abscess without bleeding: Secondary | ICD-10-CM

## 2022-01-21 ENCOUNTER — Telehealth: Payer: Self-pay | Admitting: Family Medicine

## 2022-01-21 NOTE — Telephone Encounter (Signed)
Called and spoke with pt she is feeling bad for the last 2 day , she said that headache when away and come back. She has tried taking tylenol it helped some but the headache is still there. She is having some nausea no vomiting, feeling weak. She as checked he b/p 128/70 . She is not sure if it come for the new medication that she started Norvasc .

## 2022-01-21 NOTE — Telephone Encounter (Signed)
I'm glad bp is ok.   If she thinks norvasc is causing the headache she can hold it for a few days (watching bp) . I am out of the office but think she will need to follow up with someone while I am out if symptoms do not improve.  She should let neurology know also.  Please keep me posted. Go to ER if symptoms become severe or if any new stroke symptoms

## 2022-01-21 NOTE — Telephone Encounter (Signed)
Patient called because she saw Dr Glori Bickers on 01/06/22 and Dr Glori Bickers told her to call back if she still had her headache and she said she does, she said it had went away a little bit but came back and is about the same as before. She said she does feel weak as well. Call back is [phone]

## 2022-01-21 NOTE — Telephone Encounter (Signed)
Called and spoke w/ pt give her  recommendation , pt verbalize that she understood ,she said that she will  try this and will follow up US to let us know.

## 2022-01-22 DIAGNOSIS — I10 Essential (primary) hypertension: Secondary | ICD-10-CM

## 2022-01-22 DIAGNOSIS — E785 Hyperlipidemia, unspecified: Secondary | ICD-10-CM | POA: Diagnosis not present

## 2022-01-27 ENCOUNTER — Ambulatory Visit: Payer: Medicare HMO

## 2022-01-27 ENCOUNTER — Ambulatory Visit: Payer: Medicare HMO | Attending: Family Medicine | Admitting: Physical Therapy

## 2022-01-27 ENCOUNTER — Encounter: Payer: Self-pay | Admitting: Physical Therapy

## 2022-01-27 DIAGNOSIS — R269 Unspecified abnormalities of gait and mobility: Secondary | ICD-10-CM | POA: Insufficient documentation

## 2022-01-27 DIAGNOSIS — R278 Other lack of coordination: Secondary | ICD-10-CM | POA: Insufficient documentation

## 2022-01-27 DIAGNOSIS — R41841 Cognitive communication deficit: Secondary | ICD-10-CM | POA: Insufficient documentation

## 2022-01-27 DIAGNOSIS — R262 Difficulty in walking, not elsewhere classified: Secondary | ICD-10-CM | POA: Insufficient documentation

## 2022-01-27 DIAGNOSIS — M6281 Muscle weakness (generalized): Secondary | ICD-10-CM | POA: Diagnosis not present

## 2022-01-27 NOTE — Therapy (Signed)
OUTPATIENT OCCUPATIONAL THERAPY NEURO EVALUATION  Patient Name: Cheryl Joseph MRN: [id number] DOB:1940/12/03, 81 y.o., female Today's Date: 01/27/2022  PCP: Dr. Loura Pardon, PCP  REFERRING PROVIDER: Dr. Jennings Books   OT End of Session - 01/27/22 2234     Visit Number 1    Number of Visits 1    OT Start Time 1100    OT Stop Time 1150    OT Time Calculation (min) 50 min    Activity Tolerance Patient tolerated treatment well    Behavior During Therapy [facility] for tasks assessed/performed             Past Medical History:  Diagnosis Date   Anginal pain ([place])    Arthritis    RA hands(seronegative)   Asthma    Back pain    Cancer ([place])    skin   Cataract    HPV in female 1996   HPV with colposcopy (all neg paps since)   Hyperlipidemia    Hypertension    Incarcerated ventral hernia    Obesity    Osteopenia    Pre-diabetes    Shortness of breath dyspnea    Shoulder pain    Past Surgical History:  Procedure Laterality Date   CATARACT EXTRACTION, BILATERAL Bilateral    COLONOSCOPY  06/01/2004   COLONOSCOPY WITH PROPOFOL N/A 05/01/2015   Procedure: COLONOSCOPY WITH PROPOFOL;  Surgeon: Manya Silvas, MD;  Location: [place];  Service: Endoscopy;  Laterality: N/A;   CORRECTION HAMMER TOE Right    2nd toe   JOINT REPLACEMENT Right    right knee replacement January 2021   SQUAMOUS CELL CARCINOMA EXCISION Left 12/2017   left arm   TONSILLECTOMY     VAGINAL HYSTERECTOMY  2015   with pelvic organ prolapse surgery    XI ROBOTIC ASSISTED VENTRAL HERNIA N/A 06/10/2021   Procedure: XI ROBOTIC ASSISTED VENTRAL HERNIA;  Surgeon: Herbert Pun, MD;  Location: [facility];  Service: General;  Laterality: N/A;   Patient Active Problem List   Diagnosis Date Noted   History of stroke 09/73/5329   Umbilical hernia 92/42/6834   Frequent stools 01/04/2020   Pre-operative general physical examination 07/06/2019   Lower abdominal pain 08/16/2018   Stress reaction  09/05/2017   Estrogen deficiency 05/25/2016   Routine general medical examination at a health care facility 11/23/2015   Hammertoe 10/28/2015   Frequent loose stools 02/14/2015   Diverticulosis of colon without hemorrhage 02/14/2015   Colon cancer screening 08/19/2014   Essential hypertension 02/25/2014   Encounter for Medicare annual wellness exam 07/16/2013   Prediabetes 05/14/2009   Rheumatoid arthritis with positive rheumatoid factor ([place]) 05/13/2008   Osteopenia 03/07/2007   Hyperlipidemia 01/03/2007   Obesity (BMI 30-39.9) 01/03/2007   ALLERGIC RHINITIS, SEASONAL 01/03/2007   ASTHMA 01/03/2007   OVERACTIVE BLADDER 01/03/2007    ONSET DATE: 12/31/21  REFERRING DIAG: CVA  THERAPY DIAG:  Muscle weakness (generalized)  Other lack of coordination  Rationale for Evaluation and Treatment Rehabilitation  SUBJECTIVE:   SUBJECTIVE STATEMENT: "I feel like I'm doing a lot better, but my hand feels a little weak."  Pt accompanied by: self  PERTINENT HISTORY:  Per chart, SABRIAH Joseph is an 81 y.o. female with medical history significant of HTN, HLD, hypertriglyceridemia, and asthma who presented with right-sided neurological symptoms.     Pt reported going to bed normal last night.  Woke up around 3 am and noticed right leg dragging and right arm numbness.  This morning, pt  was having difficulty using her right hand to comb her hair, and could not grasp.  Symptoms still present after arrival to the ED but improved.   PRECAUTIONS: None  WEIGHT BEARING RESTRICTIONS No  PAIN:  Are you having pain? Yes: NPRS scale: 2/10 Pain location: head Pain description: headache, pt reports she's had a mild headache since her stroke Aggravating factors: stress worsens headache Relieving factors: otc pain meds  FALLS: Has patient fallen in last 6 months? No  LIVING ENVIRONMENT: Lives with: lives with their spouse Lives in: house Stairs: Yes: External: 3 steps; can reach both Has  following equipment at home:  Single point cane, shower chair, and Grab bars PLOF: Independent  PATIENT GOALS:  Learn exercises for strengthening R hand   OBJECTIVE:   HAND DOMINANCE: Left, pt quite ambidextrous.  Pt reports that she writes and eats with L hand, but brushes teeth and combs hair with the R  ADLs: Overall ADLs: indep Transfers/ambulation related to ADLs: indep Eating: indep, including cutting food  Grooming: indep UB Dressing: indep LB Dressing: indep Toileting: indep Bathing: modified indep (uses grab bars but does not require shower chair) Tub Shower transfers: modified indep Equipment: Walk in shower   IADLs: Shopping: feels more comfortable pushing a cart and didn't have to before CVA  Light housekeeping: indep Meal Prep: indep Community mobility: indep-modified I (pushes cart for stability in grocery store) Medication management: indep and uses pill Gaffer: indep   MOBILITY STATUS: Independent  POSTURE COMMENTS:  No Significant postural limitations Sitting balance:  normal   FUNCTIONAL OUTCOME MEASURES: FOTO: 77  UPPER EXTREMITY ROM    BUE ROM WNL        UPPER EXTREMITY MMT:                BUEs grossly 4+/5  HAND FUNCTION: Grip strength: Right: 25 lbs; Left: 32 lbs, Lateral pinch: Right: 8 lbs, Left: 7 lbs, and 3 point pinch: Right: 8 lbs, Left: 7 lbs  COORDINATION: Finger Nose Finger test: WNL bilat 9 Hole Peg test: Right: 21 sec; Left: 21 sec  SENSATION: Light touch: Impaired , pt reports some numbness in R hand 4th and 5th digits.    EDEMA: none  MUSCLE TONE: RUE: Within functional limits and LUE: Within functional limits  COGNITION: Overall cognitive status:  Pt reports that she's noticed some word finding problems since her CVA; SLP eval will be requested  VISION: No changes from baseline; pt wears glasses  PERCEPTION: WFL  PRAXIS: WFL   TODAY'S TREATMENT:  Therapeutic Exercise: Issued pink  theraputty and instructed pt in strengthening and coordination exercises for R/L hands, including gross grasping, lateral/2 point/3 point pinching, and digit abd/add.  Able to return demo with good quality of movement.  Issued written instructions for carry over at home.  Pt's bilat hand strength is equal, but pt with hx of arthritis in bilat hands, so encouraged pt to use putty regularly to maintain strength and flexibility in then hands.  Pt verbalized understanding.   PATIENT EDUCATION: Education details: Theraputty HEP; Educated on considerations with diminished light touch in R hand digits, including using extra caution and visual attention to the R hand when using sharp objects in the kitchen, and making sure to avoid testing water temps with the R hand.  Pt verbalized understanding.  Person educated: Patient Education method: Explanation, Demonstration, Verbal cues, and Handouts Education comprehension: verbalized understanding and returned demonstration   HOME EXERCISE PROGRAM: Theraputty, see above  GOALS: Goals reviewed with patient? Yes  SHORT TERM GOALS: Target date: 01/27/22  Pt will be indep to perform HEP for bilat hand strengthening.  Baseline: HEP initiated at eval with pt able to demo indep with written handout.  Goal status: MET  ASSESSMENT:  CLINICAL IMPRESSION: Patient is an 81 y.o. female who was seen today for occupational therapy evaluation for RUE weakness post CVA.   PERFORMANCE DEFICITS in functional skills including sensation and strength IMPAIRMENTS are limiting patient from ADLs and IADLs.   COMORBIDITIES may have co-morbidities  that affects occupational performance. Patient will benefit from skilled OT to address above impairments and improve overall function.  MODIFICATION OR ASSISTANCE TO COMPLETE EVALUATION: No modification of tasks or assist necessary to complete an evaluation.  OT OCCUPATIONAL PROFILE AND HISTORY: Problem focused assessment:  Including review of records relating to presenting problem.  CLINICAL DECISION MAKING: LOW - limited treatment options, no task modification necessary  REHAB POTENTIAL: Excellent  EVALUATION COMPLEXITY: Low    PLAN: OT FREQUENCY: one time visit  OT DURATION: 1 week  PLANNED INTERVENTIONS: self care/ADL training, therapeutic exercise, and patient/family education  RECOMMENDED OTHER SERVICES: SLP to eval and treat for wording finding deficits   CONSULTED AND AGREED WITH PLAN OF CARE: Patient   Leta Speller, MS, OTR/L  Darleene Cleaver, OT 01/27/2022, 10:37 PM

## 2022-01-27 NOTE — Telephone Encounter (Signed)
Patient is calling in per Dr.Tower's recommendation, wanting to know if it is safe to go back on medication.

## 2022-01-27 NOTE — Therapy (Signed)
OUTPATIENT PHYSICAL THERAPY NEURO EVALUATION   Patient Name: Cheryl Joseph MRN: [id number] DOB:Nov 20, 1940, 81 y.o., female Today's Date: 01/27/2022   PCP:   Abner Greenspan, MD   REFERRING PROVIDER: Vladimir Crofts, MD   PT End of Session - 01/27/22 1256     Visit Number 1    Number of Visits 24    Date for PT Re-Evaluation 04/21/22    PT Start Time 0867    PT Stop Time 1100    PT Time Calculation (min) 45 min    Equipment Utilized During Treatment Gait belt    Activity Tolerance Patient tolerated treatment well             Past Medical History:  Diagnosis Date   Anginal pain ([place])    Arthritis    RA hands(seronegative)   Asthma    Back pain    Cancer ([place])    skin   Cataract    HPV in female 1996   HPV with colposcopy (all neg paps since)   Hyperlipidemia    Hypertension    Incarcerated ventral hernia    Obesity    Osteopenia    Pre-diabetes    Shortness of breath dyspnea    Shoulder pain    Past Surgical History:  Procedure Laterality Date   CATARACT EXTRACTION, BILATERAL Bilateral    COLONOSCOPY  06/01/2004   COLONOSCOPY WITH PROPOFOL N/A 05/01/2015   Procedure: COLONOSCOPY WITH PROPOFOL;  Surgeon: Manya Silvas, MD;  Location: [facility];  Service: Endoscopy;  Laterality: N/A;   CORRECTION HAMMER TOE Right    2nd toe   JOINT REPLACEMENT Right    right knee replacement January 2021   SQUAMOUS CELL CARCINOMA EXCISION Left 12/2017   left arm   TONSILLECTOMY     VAGINAL HYSTERECTOMY  2015   with pelvic organ prolapse surgery    XI ROBOTIC ASSISTED VENTRAL HERNIA N/A 06/10/2021   Procedure: XI ROBOTIC ASSISTED VENTRAL HERNIA;  Surgeon: Herbert Pun, MD;  Location: [facility];  Service: General;  Laterality: N/A;   Patient Active Problem List   Diagnosis Date Noted   History of stroke 61/95/0932   Umbilical hernia 67/06/4579   Frequent stools 01/04/2020   Pre-operative general physical examination 07/06/2019   Lower abdominal  pain 08/16/2018   Stress reaction 09/05/2017   Estrogen deficiency 05/25/2016   Routine general medical examination at a health care facility 11/23/2015   Hammertoe 10/28/2015   Frequent loose stools 02/14/2015   Diverticulosis of colon without hemorrhage 02/14/2015   Colon cancer screening 08/19/2014   Essential hypertension 02/25/2014   Encounter for Medicare annual wellness exam 07/16/2013   Prediabetes 05/14/2009   Rheumatoid arthritis with positive rheumatoid factor ([place]) 05/13/2008   Osteopenia 03/07/2007   Hyperlipidemia 01/03/2007   Obesity (BMI 30-39.9) 01/03/2007   ALLERGIC RHINITIS, SEASONAL 01/03/2007   ASTHMA 01/03/2007   OVERACTIVE BLADDER 01/03/2007    ONSET DATE: 12/31/21  REFERRING DIAG: D98.33 (ICD-10-CM) - Personal history of transient ischemic attack (TIA), and cerebral infarction without residual deficits  THERAPY DIAG:  Abnormality of gait and mobility  Difficulty in walking, not elsewhere classified  Muscle weakness (generalized)  Rationale for Evaluation and Treatment Rehabilitation  SUBJECTIVE:  SUBJECTIVE STATEMENT: Pt reports her biggest concern is her balance at the moment. She reports she is not dizzy necessarily but she is off. Pt reports no falls in the last 6 months and she does not utilize an AD at this time. Pt has had PT in the past but balance was not the main focus, pt also has had PT for her R TKA. Pt had stroke on 12/31/21 and has experienced these deficits ( although they have been improving) since then.  With her balance pt feels when she gets up to walk she feels like she needs to hold onto something.She also experiences this in most novel situations such as walking on unfamiliar terrain or areas.  Pt has 20 grandchildren/ great grandchildren who range from  2 years to 61 years and is also expecting a new great grandchild soon. Pt also enjoys travelling with her husband and tries to exercise for 20 minutes per day.   Pt accompanied by: self  PERTINENT HISTORY: Pt reports he biggest concern is her balance at the moment. She reports she is not dizzy necessarily but she is off. Pt reports no falls in the last 6 months and she does not utilize an AD at this time. Pt has had PT in the past but balance was not the main focus, pt also has had PT for her R TKA.  With her balance pt feels when she gets up to walk she feels like she needs to hold onto something. Pt has 20 grandchildren who range from 2 years to 70 years and is also expecting a new great grandchild soon. Pt also enjoys travelling with her husband and tries to exercise for 20 minutes per day with walking or other light activities.   PAIN:  Are you having pain? No  PRECAUTIONS: Fall  WEIGHT BEARING RESTRICTIONS No  FALLS: Has patient fallen in last 6 months? No  LIVING ENVIRONMENT: Lives with: lives with their family and lives with their spouse Lives in: House/apartment Stairs: Yes: External: 3 steps; on right going up Has following equipment at home: Single point cane and Walker - 2 wheeled  PLOF: Independent and Independent with basic ADLs  PATIENT GOALS Improve strength, balance, and confidence with activities  OBJECTIVE:   DIAGNOSTIC FINDINGS: Small acute perforator infarct in the left basal ganglia.   COGNITION: Overall cognitive status: Within functional limits for tasks assessed   SENSATION: WFL   POSTURE: No Significant postural limitations   LOWER EXTREMITY MMT:    MMT Right Eval Left Eval  Hip flexion 4 4+  Hip extension    Hip abduction 4+ 4+  Hip adduction 5 5  Hip internal rotation    Hip external rotation    Knee flexion 4 4+  Knee extension 4 4+  Ankle dorsiflexion 5 5  Ankle plantarflexion 5 5  Ankle inversion    Ankle eversion    (Blank rows =  not tested)    GAIT: Gait pattern: WFL Distance walked: 100 feet  Assistive device utilized:  none Level of assistance: Complete Independence Comments: no LOB noted, no significant gait abnormalities   FUNCTIONAL TESTs:  5 times sit to stand: 17.3 Timed up and go (TUG): 11.87 Berg Balance Scale: 45  [facility] PT Assessment - 01/27/22 0001       Standardized Balance Assessment   Standardized Balance Assessment Berg Balance Test      Berg Balance Test   Sit to Stand Able to stand  independently using hands  Standing Unsupported Able to stand safely 2 minutes    Sitting with Back Unsupported but Feet Supported on Floor or Stool Able to sit safely and securely 2 minutes    Stand to Sit Controls descent by using hands    Transfers Able to transfer safely, minor use of hands    Standing Unsupported with Eyes Closed Able to stand 10 seconds with supervision    Standing Unsupported with Feet Together Able to place feet together independently and stand 1 minute safely    From Standing, Reach Forward with Outstretched Arm Can reach forward >12 cm safely (5")    From Standing Position, Pick up Object from [place] to pick up shoe safely and easily    From Standing Position, Turn to Look Behind Over each Shoulder Looks behind from both sides and weight shifts well    Turn 360 Degrees Able to turn 360 degrees safely but slowly    Standing Unsupported, Alternately Place Feet on Step/Stool Able to stand independently and complete 8 steps >20 seconds    Standing Unsupported, One Foot in Front Able to take small step independently and hold 30 seconds    Standing on One Leg Able to lift leg independently and hold equal to or more than 3 seconds    Total Score 45              PATIENT SURVEYS:  FOTO 63; Risk adjusted goal of 68  TODAY'S TREATMENT:  01/27/22  Eval only    PATIENT EDUCATION: Education details: POC, goals  Person educated: Patient Education method:  Explanation Education comprehension: verbalized understanding   HOME EXERCISE PROGRAM: To complete next session.     GOALS: Goals reviewed with patient? Yes  SHORT TERM GOALS: Target date: 02/24/2022  Patient will be independent in home exercise program to improve strength/mobility for better functional independence with ADLs. Baseline: Goal status: INITIAL    LONG TERM GOALS: Target date: 04/21/2022  Patient will increase FOTO score to equal to or greater than  68   to demonstrate statistically significant improvement in mobility and quality of life.  Baseline: 63 Goal status: INITIAL  2.  Patient (> 19 years old) will complete five times sit to stand test in < 15 seconds indicating an increased LE strength and improved balance. Baseline: 17.3 s Goal status: INITIAL  3.  Patient will increase Berg Balance score by > 6 points to demonstrate decreased fall risk during functional activities. Baseline: 45 Goal status: INITIAL  4.   Patient will reduce timed up and go to <11 seconds to reduce fall risk and demonstrate improved transfer/gait ability. Baseline: 11.87 Goal status: INITIAL  5.  Patient will increase BLE gross strength to 4+/5 as to improve functional strength for independent gait, increased standing tolerance and increased ADL ability. Baseline: see eval, R side weaker than left grossly  Goal status: INITIAL    ASSESSMENT:  CLINICAL IMPRESSION: Patient is a 81 y.o. female who was seen today for physical therapy evaluation and treatment for her balance and mobility following a CVA that occurred on 12/31/21. Pt presents with right sided weakness in her lower extremities. Pt also has deficits in muscular power and funcitonal strength AEB 5 X STS test which limites her mobility and also increases risk of falls. Pt also has increased risk of fals AEB BERG balance test scores and TUG. Pt  benefit from skilled physical therapy intervention to address their impairments,  improve his QOL, and attain his therapy goals.  OBJECTIVE IMPAIRMENTS decreased activity tolerance, decreased balance, and decreased strength.   ACTIVITY LIMITATIONS bending, squatting, and stairs  PARTICIPATION LIMITATIONS: cleaning, laundry, community activity, and activities going to events for her grandchildren   PERSONAL FACTORS Age and 1-2 comorbidities: HLD, HTN   are also affecting patient's functional outcome.   REHAB POTENTIAL: Excellent  CLINICAL DECISION MAKING: Stable/uncomplicated  EVALUATION COMPLEXITY: Low  PLAN: PT FREQUENCY: 2x/week  PT DURATION: 12 weeks  PLANNED INTERVENTIONS: Therapeutic exercises, Therapeutic activity, Neuromuscular re-education, Balance training, Gait training, Patient/Family education, Joint mobilization, and Stair training  PLAN FOR NEXT SESSION: HEP: balance, NBOS, initiate treatment plan    Particia Lather, PT 01/27/2022, 1:16 PM

## 2022-01-27 NOTE — Telephone Encounter (Signed)
Does she think the amlodipine (norvasc) bp med or the atorvastatin (cholesterol) medicine is causing the headache? Which one did she hold?  Thanks  I am out of the office all week

## 2022-01-27 NOTE — Telephone Encounter (Signed)
Pt has stopped taking the cholesterol medication  for the last few days, since the headache have stopped. Pt was suppose to follow up in  with last on 02/05/22 to see if the last will improve with medication, pt said was concerns she hasn't been taking  cholesterol for almost week. Pt want to know if she needs to have bloodwork and is need have medication changed.

## 2022-01-28 NOTE — Telephone Encounter (Signed)
I would like her to try crestor 20 mg - I pended to send to the pharmacy of choice  If headaches or any other side effects please stop it and let us know Schedule fasting labs for lipids in about 4 weeks

## 2022-01-28 NOTE — Telephone Encounter (Signed)
Left a detailed message on VM asking pt to verify which medication she thought was causing headaches.

## 2022-01-28 NOTE — Telephone Encounter (Signed)
Patient believes the Atorvastatin is causing her headaches.

## 2022-01-29 ENCOUNTER — Encounter: Payer: Self-pay | Admitting: Nurse Practitioner

## 2022-01-29 ENCOUNTER — Ambulatory Visit (INDEPENDENT_AMBULATORY_CARE_PROVIDER_SITE_OTHER)
Admission: RE | Admit: 2022-01-29 | Discharge: 2022-01-29 | Disposition: A | Discharge status: Home / Self Care | Payer: Medicare HMO | Source: Ambulatory Visit | Attending: Nurse Practitioner | Admitting: Nurse Practitioner

## 2022-01-29 ENCOUNTER — Ambulatory Visit (INDEPENDENT_AMBULATORY_CARE_PROVIDER_SITE_OTHER): Payer: Medicare HMO | Admitting: Nurse Practitioner

## 2022-01-29 ENCOUNTER — Telehealth: Payer: Self-pay

## 2022-01-29 VITALS — BP 120/70 | HR 65 | Temp 98.3°F | Resp 16 | Ht 62.0 in | Wt 200.0 lb

## 2022-01-29 DIAGNOSIS — R8290 Unspecified abnormal findings in urine: Secondary | ICD-10-CM | POA: Diagnosis not present

## 2022-01-29 DIAGNOSIS — M545 Low back pain, unspecified: Secondary | ICD-10-CM | POA: Insufficient documentation

## 2022-01-29 DIAGNOSIS — R1032 Left lower quadrant pain: Secondary | ICD-10-CM | POA: Diagnosis not present

## 2022-01-29 DIAGNOSIS — R3129 Other microscopic hematuria: Secondary | ICD-10-CM | POA: Insufficient documentation

## 2022-01-29 DIAGNOSIS — R109 Unspecified abdominal pain: Secondary | ICD-10-CM | POA: Diagnosis not present

## 2022-01-29 LAB — URINALYSIS, MICROSCOPIC ONLY: RBC / HPF: NONE SEEN (ref 0–?)

## 2022-01-29 LAB — CBC
HCT: 44.6 % (ref 36.0–46.0)
Hemoglobin: 14.8 g/dL (ref 12.0–15.0)
MCHC: 33.1 g/dL (ref 30.0–36.0)
MCV: 94 fl (ref 78.0–100.0)
Platelets: 271 10*3/uL (ref 150.0–400.0)
RBC: 4.75 Mil/uL (ref 3.87–5.11)
RDW: 14 % (ref 11.5–15.5)
WBC: 6.8 10*3/uL (ref 4.0–10.5)

## 2022-01-29 LAB — POCT URINALYSIS DIPSTICK
Bilirubin, UA: NEGATIVE
Glucose, UA: NEGATIVE
Ketones, UA: NEGATIVE
Leukocytes, UA: NEGATIVE
Nitrite, UA: NEGATIVE
Protein, UA: NEGATIVE
Spec Grav, UA: 1.02 (ref 1.010–1.025)
Urobilinogen, UA: 0.2 E.U./dL
pH, UA: 5 (ref 5.0–8.0)

## 2022-01-29 LAB — COMPREHENSIVE METABOLIC PANEL
ALT: 15 U/L (ref 0–35)
AST: 19 U/L (ref 0–37)
Albumin: 3.9 g/dL (ref 3.5–5.2)
Alkaline Phosphatase: 95 U/L (ref 39–117)
BUN: 15 mg/dL (ref 6–23)
CO2: 26 mEq/L (ref 19–32)
Calcium: 9 mg/dL (ref 8.4–10.5)
Chloride: 106 mEq/L (ref 96–112)
Creatinine, Ser: 0.74 mg/dL (ref 0.40–1.20)
GFR: 75.55 mL/min (ref >60.00)
Glucose, Bld: 125 mg/dL — ABNORMAL HIGH (ref 70–99)
Potassium: 4.3 mEq/L (ref 3.5–5.1)
Sodium: 140 mEq/L (ref 135–145)
Total Bilirubin: 0.6 mg/dL (ref 0.2–1.2)
Total Protein: 6.5 g/dL (ref 6.0–8.3)

## 2022-01-29 MED ORDER — ROSUVASTATIN CALCIUM 20 MG PO TABS: 20.0000 mg | ORAL_TABLET | Freq: Every day | ORAL | 3 refills | Status: DC

## 2022-01-29 NOTE — Assessment & Plan Note (Signed)
Microscopic hematuria send off for culture and microscopy.

## 2022-01-29 NOTE — Progress Notes (Signed)
Acute Office Visit  Subjective:     Patient ID: Cheryl Joseph, female    DOB: 06-12-1941, 81 y.o.   MRN: [id number]  Chief Complaint  Patient presents with   Back Pain    Off and on for 4 weeks. Started hurting worse last night.     Patient is in today for back apin  States that it started Monday with left lower abdominal pain . That was persistent. States that it was a dull aching. "Feels like her diverticulitis", it was constant no break in pain on Tuesday.  The following day Wednesday patient had no pain currently having no pain in office  Back pain is scribed as a constant ache States that she took tylenol that did help some. No discrete injury.  No hematuria but did have some blood on the pad. No vaginal symptoms. No history of kidney stone   Review of Systems  Constitutional:  Negative for chills and fever.  Gastrointestinal:  Positive for abdominal pain. Negative for nausea and vomiting.       Last BM yesterday morning that was loose  Yesterday morning had some diarrhea.  Genitourinary:  Negative for dysuria, flank pain, frequency and hematuria.  Musculoskeletal:  Positive for back pain.  Neurological:  Negative for tingling and weakness.        Objective:    BP 120/70   Pulse 65   Temp 98.3 F (36.8 C)   Resp 16   Ht '5\' 2"'$  (1.575 m)   Wt 200 lb (90.7 kg)   SpO2 96%   BMI 36.58 kg/m    Physical Exam Vitals and nursing note reviewed.  Constitutional:      Appearance: Normal appearance. She is obese.  Cardiovascular:     Rate and Rhythm: Normal rate and regular rhythm.     Heart sounds: Normal heart sounds.  Pulmonary:     Effort: Pulmonary effort is normal.     Breath sounds: Normal breath sounds.  Abdominal:     General: Bowel sounds are normal. There is no distension.     Palpations: There is no mass.     Tenderness: There is no abdominal tenderness.     Hernia: No hernia is present.  Musculoskeletal:     Lumbar back: No tenderness or  bony tenderness. Negative right straight leg raise test and negative left straight leg raise test.       Back:     Left hip: Normal. No tenderness, bony tenderness or crepitus. Normal range of motion. Normal strength.  Neurological:     General: No focal deficit present.     Mental Status: She is alert.     Results for orders placed or performed in visit on 01/29/22  POCT urinalysis dipstick  Result Value Ref Range   Color, UA yellow    Clarity, UA cloudy    Glucose, UA Negative Negative   Bilirubin, UA Negative    Ketones, UA Negative    Spec Grav, UA 1.020 1.010 - 1.025   Blood, UA small    pH, UA 5.0 5.0 - 8.0   Protein, UA Negative Negative   Urobilinogen, UA 0.2 0.2 or 1.0 E.U./dL   Nitrite, UA Negative    Leukocytes, UA Negative Negative   Appearance cloudy    Odor          Assessment & Plan:   Problem List Items Addressed This Visit       Genitourinary   Microscopic hematuria  1+ on UA in office today pending culture and microscopy      Relevant Orders   Urine Culture   Urine Microscopic     Other   Abdominal pain, left lower quadrant - Primary    Patient does have diverticulitis.  No pain on palpation today in office.  We will check labs and do a abdominal 2 view to make sure she not having a referral where pain in regards to kidney stone.  Patient has no history of the same and small amounts of blood on UA      Relevant Orders   CBC   Comprehensive metabolic panel   Urine Culture   Urine Microscopic   DG Abd 2 Views   POCT urinalysis dipstick (Completed)   Acute bilateral low back pain without sciatica    Nonspecific very ambiguous in nature.  No tenderness to palpation on skeletal structures.  Cannot elicit pain in office.  She continue using over-the-counter Tylenol as directed also add on Voltaren gel as needed follow-up if no improvement      Relevant Orders   CBC   Comprehensive metabolic panel   Abnormal urine    Microscopic hematuria  send off for culture and microscopy.      Relevant Orders   Urine Culture   Urine Microscopic    No orders of the defined types were placed in this encounter.   Return if symptoms worsen or fail to improve.  Romilda Garret, NP

## 2022-01-29 NOTE — Assessment & Plan Note (Signed)
Nonspecific very ambiguous in nature.  No tenderness to palpation on skeletal structures.  Cannot elicit pain in office.  She continue using over-the-counter Tylenol as directed also add on Voltaren gel as needed follow-up if no improvement

## 2022-01-29 NOTE — Assessment & Plan Note (Signed)
1+ on UA in office today pending culture and microscopy

## 2022-01-29 NOTE — Patient Instructions (Signed)
Nice to see you today I will be in touch with the labs and xray Follow up if you do not improve Continue taking tylenol. You can use voltaren gel over the counter for the back discomfort

## 2022-01-29 NOTE — Telephone Encounter (Signed)
Please cancel the Friday appt and re schedule lipid labs for about a month

## 2022-01-29 NOTE — Telephone Encounter (Signed)
Spoke to pt and advise pt that we do not have results to labs, and that someone would call her when we get the results.

## 2022-01-29 NOTE — Telephone Encounter (Signed)
Patient is calling in asking if the results can be resulted by the doctor today. Advised that we can not guarantee the results will have a comment by the doctor before the end of the day but would send a message.

## 2022-01-29 NOTE — Assessment & Plan Note (Signed)
Patient does have diverticulitis.  No pain on palpation today in office.  We will check labs and do a abdominal 2 view to make sure she not having a referral where pain in regards to kidney stone.  Patient has no history of the same and small amounts of blood on UA

## 2022-01-29 NOTE — Telephone Encounter (Signed)
Patient notified as instructed by telephone and verbalized understanding. Patient stated that she had to come to the office to see Romilda Garret NP today and several test were ordered. Patient stated that she would like the script sent to the pharmacy. Patient stated that she has not had any cholesterol medication in over a week and a 1/2. Script sent to [facility] per patient's request. Patient wants to know from Dr. Glori Bickers if she should cancel her upcoming lab appointment for next Friday. Patient stated that she would like a call from the office when her test results come back from today.

## 2022-01-30 LAB — URINE CULTURE
MICRO NUMBER:: 13617962
SPECIMEN QUALITY:: ADEQUATE

## 2022-02-01 NOTE — Telephone Encounter (Signed)
Spoke to patient by telephone and lab appointment cancelled for Friday. Lab appointment rescheduled for 03/04/22 at 8:30.

## 2022-02-01 NOTE — Therapy (Signed)
OUTPATIENT PHYSICAL THERAPY TREATMENT   Patient Name: Cheryl Joseph MRN: [id number] DOB:24-Oct-1940, 81 y.o., female Today's Date: 02/02/2022   PCP:   Abner Greenspan, MD   REFERRING PROVIDER: Vladimir Crofts, MD   PT End of Session - 02/02/22 1429     Visit Number 2    Number of Visits 24    Date for PT Re-Evaluation 04/21/22    PT Start Time 1430    PT Stop Time 1514    PT Time Calculation (min) 44 min    Equipment Utilized During Treatment Gait belt    Activity Tolerance Patient tolerated treatment well              Past Medical History:  Diagnosis Date   Anginal pain ([place])    Arthritis    RA hands(seronegative)   Asthma    Back pain    Cancer ([place])    skin   Cataract    HPV in female 1996   HPV with colposcopy (all neg paps since)   Hyperlipidemia    Hypertension    Incarcerated ventral hernia    Obesity    Osteopenia    Pre-diabetes    Shortness of breath dyspnea    Shoulder pain    Past Surgical History:  Procedure Laterality Date   CATARACT EXTRACTION, BILATERAL Bilateral    COLONOSCOPY  06/01/2004   COLONOSCOPY WITH PROPOFOL N/A 05/01/2015   Procedure: COLONOSCOPY WITH PROPOFOL;  Surgeon: Manya Silvas, MD;  Location: [facility];  Service: Endoscopy;  Laterality: N/A;   CORRECTION HAMMER TOE Right    2nd toe   JOINT REPLACEMENT Right    right knee replacement January 2021   SQUAMOUS CELL CARCINOMA EXCISION Left 12/2017   left arm   TONSILLECTOMY     VAGINAL HYSTERECTOMY  2015   with pelvic organ prolapse surgery    XI ROBOTIC ASSISTED VENTRAL HERNIA N/A 06/10/2021   Procedure: XI ROBOTIC ASSISTED VENTRAL HERNIA;  Surgeon: Herbert Pun, MD;  Location: [facility];  Service: General;  Laterality: N/A;   Patient Active Problem List   Diagnosis Date Noted   Acute bilateral low back pain without sciatica 01/29/2022   Microscopic hematuria 01/29/2022   Abnormal urine 01/29/2022   History of stroke 84/66/5993   Umbilical  hernia 57/07/7791   Frequent stools 01/04/2020   Pre-operative general physical examination 07/06/2019   Lower abdominal pain 08/16/2018   Stress reaction 09/05/2017   Estrogen deficiency 05/25/2016   Routine general medical examination at a health care facility 11/23/2015   Hammertoe 10/28/2015   Frequent loose stools 02/14/2015   Diverticulosis of colon without hemorrhage 02/14/2015   Colon cancer screening 08/19/2014   Abdominal pain, left lower quadrant 03/18/2014   Essential hypertension 02/25/2014   Encounter for Medicare annual wellness exam 07/16/2013   Prediabetes 05/14/2009   Rheumatoid arthritis with positive rheumatoid factor ([place]) 05/13/2008   Osteopenia 03/07/2007   Hyperlipidemia 01/03/2007   Obesity (BMI 30-39.9) 01/03/2007   ALLERGIC RHINITIS, SEASONAL 01/03/2007   ASTHMA 01/03/2007   OVERACTIVE BLADDER 01/03/2007    ONSET DATE: 12/31/21  REFERRING DIAG: J03.00 (ICD-10-CM) - Personal history of transient ischemic attack (TIA), and cerebral infarction without residual deficits  THERAPY DIAG:  Muscle weakness (generalized)  Other lack of coordination  Abnormality of gait and mobility  Difficulty in walking, not elsewhere classified  Rationale for Evaluation and Treatment Rehabilitation  SUBJECTIVE:  SUBJECTIVE STATEMENT: Patient reports having a good weekend. No falls or LOB since last session.   PERTINENT HISTORY: Pt reports he biggest concern is her balance at the moment. She reports she is not dizzy necessarily but she is off. Pt reports no falls in the last 6 months and she does not utilize an AD at this time. Pt has had PT in the past but balance was not the main focus, pt also has had PT for her R TKA.  With her balance pt feels when she gets up to walk she feels like  she needs to hold onto something. Pt has 20 grandchildren who range from 2 years to 10 years and is also expecting a new great grandchild soon. Pt also enjoys travelling with her husband and tries to exercise for 20 minutes per day with walking or other light activities.   PAIN:  Are you having pain? No  PRECAUTIONS: Fall  WEIGHT BEARING RESTRICTIONS No  FALLS: Has patient fallen in last 6 months? No  LIVING ENVIRONMENT: Lives with: lives with their family and lives with their spouse Lives in: House/apartment Stairs: Yes: External: 3 steps; on right going up Has following equipment at home: Single point cane and Walker - 2 wheeled  PLOF: Independent and Independent with basic ADLs  PATIENT GOALS Improve strength, balance, and confidence with activities  OBJECTIVE:   LOWER EXTREMITY MMT:    MMT Right Eval Left Eval  Hip flexion 4 4+  Hip extension    Hip abduction 4+ 4+  Hip adduction 5 5  Hip internal rotation    Hip external rotation    Knee flexion 4 4+  Knee extension 4 4+  Ankle dorsiflexion 5 5  Ankle plantarflexion 5 5  Ankle inversion    Ankle eversion    (Blank rows = not tested)   TODAY'S TREATMENT:  02/02/22  Neuro Re-ed: Standing with CGA next to support surface:  Airex pad: static stand 30 seconds x 2 trials, noticeable trembling of ankles/LE's with fatigue and challenge to maintain stability Airex pad: horizontal head turns 30 seconds scanning room 10x ; cueing for arc of motion  Airex pad: vertical head turns 30 seconds, cueing for arc of motion, noticeable sway with upward gaze increasing demand on ankle righting reaction musculature Airex pad: one foot on 6" step one foot on airex pad, hold position for 30 seconds, switch legs, 2x each LE;  Airex balance beam:  -lateral stepping 6x lenth of // bars -tandem walking 6x length of // bars  Bosu ball rounded side up: -lateral modified lunge 10x each LE  therEx:  10xSTS GTB march 15x into  theraband   Seated: GTB 15x (added to HEP) march  Seated LAQ with adduction ball squeeze 15x Seated heel toe raise  PATIENT EDUCATION: Education details: POC, goals  Person educated: Patient Education method: Explanation Education comprehension: verbalized understanding   HOME EXERCISE PROGRAM: To complete next session.     GOALS: Goals reviewed with patient? Yes  SHORT TERM GOALS: Target date: 02/24/2022  Patient will be independent in home exercise program to improve strength/mobility for better functional independence with ADLs. Baseline: Goal status: INITIAL    LONG TERM GOALS: Target date: 04/27/2022  Patient will increase FOTO score to equal to or greater than  68   to demonstrate statistically significant improvement in mobility and quality of life.  Baseline: 63 Goal status: INITIAL  2.  Patient (> 85 years old) will complete five times sit to stand  test in < 15 seconds indicating an increased LE strength and improved balance. Baseline: 17.3 s Goal status: INITIAL  3.  Patient will increase Berg Balance score by > 6 points to demonstrate decreased fall risk during functional activities. Baseline: 45 Goal status: INITIAL  4.   Patient will reduce timed up and go to <11 seconds to reduce fall risk and demonstrate improved transfer/gait ability. Baseline: 11.87 Goal status: INITIAL  5.  Patient will increase BLE gross strength to 4+/5 as to improve functional strength for independent gait, increased standing tolerance and increased ADL ability. Baseline: see eval, R side weaker than left grossly  Goal status: INITIAL    ASSESSMENT:  CLINICAL IMPRESSION: Patient presents with excellent motivation throughout physical therapy session. Improved ankle righting reactions with unstable surfaces with repetition.  Patient tolerates strengthening interventions with occasional rest breaks. Pt  benefit from skilled physical therapy intervention to address their  impairments, improve his QOL, and attain his therapy goals.     OBJECTIVE IMPAIRMENTS decreased activity tolerance, decreased balance, and decreased strength.   ACTIVITY LIMITATIONS bending, squatting, and stairs  PARTICIPATION LIMITATIONS: cleaning, laundry, community activity, and activities going to events for her grandchildren   PERSONAL FACTORS Age and 1-2 comorbidities: HLD, HTN   are also affecting patient's functional outcome.   REHAB POTENTIAL: Excellent  CLINICAL DECISION MAKING: Stable/uncomplicated  EVALUATION COMPLEXITY: Low  PLAN: PT FREQUENCY: 2x/week  PT DURATION: 12 weeks  PLANNED INTERVENTIONS: Therapeutic exercises, Therapeutic activity, Neuromuscular re-education, Balance training, Gait training, Patient/Family education, Joint mobilization, and Stair training  PLAN FOR NEXT SESSION: HEP: balance, NBOS, initiate treatment plan    Janna Arch, PT 02/02/2022, 3:15 PM

## 2022-02-02 ENCOUNTER — Encounter: Payer: Medicare HMO | Admitting: Occupational Therapy

## 2022-02-02 ENCOUNTER — Ambulatory Visit: Payer: Medicare HMO

## 2022-02-02 DIAGNOSIS — M6281 Muscle weakness (generalized): Secondary | ICD-10-CM | POA: Diagnosis not present

## 2022-02-02 DIAGNOSIS — R262 Difficulty in walking, not elsewhere classified: Secondary | ICD-10-CM

## 2022-02-02 DIAGNOSIS — R269 Unspecified abnormalities of gait and mobility: Secondary | ICD-10-CM | POA: Diagnosis not present

## 2022-02-02 DIAGNOSIS — R278 Other lack of coordination: Secondary | ICD-10-CM

## 2022-02-02 DIAGNOSIS — R41841 Cognitive communication deficit: Secondary | ICD-10-CM | POA: Diagnosis not present

## 2022-02-03 ENCOUNTER — Telehealth: Payer: Self-pay | Admitting: Nurse Practitioner

## 2022-02-03 NOTE — Telephone Encounter (Signed)
-----   Message from Central sent at 02/03/2022 11:57 AM EDT ----- Patient advised. Patient does states she is better- pain is once in a while has a twinge in the left abdominal area and still feel fatigue/weakness feeling- feels like that is from recovering from stroke she had in June and had diarrhea today with some nausea. But overall she is feeling better. No fever. No more blood in the urine. No vomiting.

## 2022-02-03 NOTE — Telephone Encounter (Signed)
Glad she is improving. She can follow up if she does not continue to improve or symptoms get worse

## 2022-02-03 NOTE — Telephone Encounter (Signed)
Patient was advised to let us know if symptoms continue or get worse

## 2022-02-04 ENCOUNTER — Ambulatory Visit: Payer: Medicare HMO | Admitting: Physical Therapy

## 2022-02-04 ENCOUNTER — Encounter: Payer: Self-pay | Admitting: Physical Therapy

## 2022-02-04 ENCOUNTER — Encounter: Payer: Medicare HMO | Admitting: Occupational Therapy

## 2022-02-04 DIAGNOSIS — R278 Other lack of coordination: Secondary | ICD-10-CM | POA: Diagnosis not present

## 2022-02-04 DIAGNOSIS — R269 Unspecified abnormalities of gait and mobility: Secondary | ICD-10-CM | POA: Diagnosis not present

## 2022-02-04 DIAGNOSIS — R262 Difficulty in walking, not elsewhere classified: Secondary | ICD-10-CM | POA: Diagnosis not present

## 2022-02-04 DIAGNOSIS — M6281 Muscle weakness (generalized): Secondary | ICD-10-CM | POA: Diagnosis not present

## 2022-02-04 DIAGNOSIS — R41841 Cognitive communication deficit: Secondary | ICD-10-CM | POA: Diagnosis not present

## 2022-02-04 NOTE — Therapy (Signed)
OUTPATIENT PHYSICAL THERAPY TREATMENT   Patient Name: Cheryl Joseph MRN: [id number] DOB:1941-03-06, 81 y.o., female Today's Date: 02/04/2022   PCP:   Abner Greenspan, MD   REFERRING PROVIDER: Vladimir Crofts, MD   PT End of Session - 02/04/22 0758     Visit Number 3    Number of Visits 24    Date for PT Re-Evaluation 04/21/22    PT Start Time 0803    PT Stop Time 0845    PT Time Calculation (min) 42 min    Equipment Utilized During Treatment Gait belt    Activity Tolerance Patient tolerated treatment well    Behavior During Therapy WFL for tasks assessed/performed              Past Medical History:  Diagnosis Date   Anginal pain ([place])    Arthritis    RA hands(seronegative)   Asthma    Back pain    Cancer ([place])    skin   Cataract    HPV in female 1996   HPV with colposcopy (all neg paps since)   Hyperlipidemia    Hypertension    Incarcerated ventral hernia    Obesity    Osteopenia    Pre-diabetes    Shortness of breath dyspnea    Shoulder pain    Past Surgical History:  Procedure Laterality Date   CATARACT EXTRACTION, BILATERAL Bilateral    COLONOSCOPY  06/01/2004   COLONOSCOPY WITH PROPOFOL N/A 05/01/2015   Procedure: COLONOSCOPY WITH PROPOFOL;  Surgeon: Manya Silvas, MD;  Location: [facility];  Service: Endoscopy;  Laterality: N/A;   CORRECTION HAMMER TOE Right    2nd toe   JOINT REPLACEMENT Right    right knee replacement January 2021   SQUAMOUS CELL CARCINOMA EXCISION Left 12/2017   left arm   TONSILLECTOMY     VAGINAL HYSTERECTOMY  2015   with pelvic organ prolapse surgery    XI ROBOTIC ASSISTED VENTRAL HERNIA N/A 06/10/2021   Procedure: XI ROBOTIC ASSISTED VENTRAL HERNIA;  Surgeon: Herbert Pun, MD;  Location: [facility];  Service: General;  Laterality: N/A;   Patient Active Problem List   Diagnosis Date Noted   Acute bilateral low back pain without sciatica 01/29/2022   Microscopic hematuria 01/29/2022   Abnormal urine  01/29/2022   History of stroke 59/93/5701   Umbilical hernia 77/93/9030   Frequent stools 01/04/2020   Pre-operative general physical examination 07/06/2019   Lower abdominal pain 08/16/2018   Stress reaction 09/05/2017   Estrogen deficiency 05/25/2016   Routine general medical examination at a health care facility 11/23/2015   Hammertoe 10/28/2015   Frequent loose stools 02/14/2015   Diverticulosis of colon without hemorrhage 02/14/2015   Colon cancer screening 08/19/2014   Abdominal pain, left lower quadrant 03/18/2014   Essential hypertension 02/25/2014   Encounter for Medicare annual wellness exam 07/16/2013   Prediabetes 05/14/2009   Rheumatoid arthritis with positive rheumatoid factor ([place]) 05/13/2008   Osteopenia 03/07/2007   Hyperlipidemia 01/03/2007   Obesity (BMI 30-39.9) 01/03/2007   ALLERGIC RHINITIS, SEASONAL 01/03/2007   ASTHMA 01/03/2007   OVERACTIVE BLADDER 01/03/2007    ONSET DATE: 12/31/21  REFERRING DIAG: S92.33 (ICD-10-CM) - Personal history of transient ischemic attack (TIA), and cerebral infarction without residual deficits  THERAPY DIAG:  Muscle weakness (generalized)  Abnormality of gait and mobility  Difficulty in walking, not elsewhere classified  Rationale for Evaluation and Treatment Rehabilitation  SUBJECTIVE:  SUBJECTIVE STATEMENT: Patient reports having a good weekend. No falls or LOB since last session.   PERTINENT HISTORY: Pt reports he biggest concern is her balance at the moment. She reports she is not dizzy necessarily but she is off. Pt reports no falls in the last 6 months and she does not utilize an AD at this time. Pt has had PT in the past but balance was not the main focus, pt also has had PT for her R TKA.  With her balance pt feels when she gets  up to walk she feels like she needs to hold onto something. Pt has 20 grandchildren who range from 2 years to 69 years and is also expecting a new great grandchild soon. Pt also enjoys travelling with her husband and tries to exercise for 20 minutes per day with walking or other light activities.   PAIN:  Are you having pain? No  PRECAUTIONS: Fall  WEIGHT BEARING RESTRICTIONS No  FALLS: Has patient fallen in last 6 months? No  LIVING ENVIRONMENT: Lives with: lives with their family and lives with their spouse Lives in: House/apartment Stairs: Yes: External: 3 steps; on right going up Has following equipment at home: Single point cane and Walker - 2 wheeled  PLOF: Independent and Independent with basic ADLs  PATIENT GOALS Improve strength, balance, and confidence with activities  OBJECTIVE:   LOWER EXTREMITY MMT:    MMT Right Eval Left Eval  Hip flexion 4 4+  Hip extension    Hip abduction 4+ 4+  Hip adduction 5 5  Hip internal rotation    Hip external rotation    Knee flexion 4 4+  Knee extension 4 4+  Ankle dorsiflexion 5 5  Ankle plantarflexion 5 5  Ankle inversion    Ankle eversion    (Blank rows = not tested)   TODAY'S TREATMENT:  02/04/22  Neuro Re-ed: therEx: Initiated and practiced initial HEP  Exercises - Sit to Stand  - 1 x daily - 7 x weekly - 2 sets - 8 reps - Standing March with Counter Support  - 1 x daily - 7 x weekly - 2 sets - 10 reps - Side Stepping with Counter Support  - 1 x daily - 7 x weekly - 2 sets - 10 reps - Standing in "bowling stance" with counter support   - 1 x daily - 7 x weekly - 2 reps - 30 seocnd hold  NMR:  Airex pad: one foot on 6" step one foot on airex pad, hold position for 30 seconds, switch legs, 2x each LE;  Airex balance beam:  -lateral stepping 4x lenth of // bars with UE Assist  -tandem walking 4x length of // bars with UE assist  -tandem walk with turn around on airex pad x 2 lengts of parallel bars with only UE  assist as needed  -lateral walking across airex beam with no UE x 1 length of // bars.   Bosu ball rounded side up: -lateral modified lunge 10x R LE   PATIENT EDUCATION: Education details: Initial HEP  Person educated: Patient Education method: Explanation Education comprehension: verbalized understanding   HOME EXERCISE PROGRAM: Access Code: [email or web address] URL: [email or web address] Date: 02/04/2022 Prepared by: Rivka Barbara  Exercises - Sit to Stand  - 1 x daily - 7 x weekly - 2 sets - 8 reps - Standing March with Counter Support  - 1 x daily - 7 x weekly - 2 sets - 10 reps - Side Stepping with  Counter Support  - 1 x daily - 7 x weekly - 2 sets - 10 reps - Standing in "bowling stance" with counter support   - 1 x daily - 7 x weekly - 2 reps - 30 seocnd hold    GOALS: Goals reviewed with patient? Yes  SHORT TERM GOALS: Target date: 02/24/2022  Patient will be independent in home exercise program to improve strength/mobility for better functional independence with ADLs. Baseline: Goal status: INITIAL    LONG TERM GOALS: Target date: 04/29/2022  Patient will increase FOTO score to equal to or greater than  68   to demonstrate statistically significant improvement in mobility and quality of life.  Baseline: 63 Goal status: INITIAL  2.  Patient (> 55 years old) will complete five times sit to stand test in < 15 seconds indicating an increased LE strength and improved balance. Baseline: 17.3 s Goal status: INITIAL  3.  Patient will increase Berg Balance score by > 6 points to demonstrate decreased fall risk during functional activities. Baseline: 45 Goal status: INITIAL  4.   Patient will reduce timed up and go to <11 seconds to reduce fall risk and demonstrate improved transfer/gait ability. Baseline: 11.87 Goal status: INITIAL  5.  Patient will increase BLE gross strength to 4+/5 as to improve functional strength for independent gait, increased  standing tolerance and increased ADL ability. Baseline: see eval, R side weaker than left grossly  Goal status: INITIAL    ASSESSMENT:  CLINICAL IMPRESSION: Continued with current plan of care as laid out in evaluation and recent prior sessions. Pt remains motivated to advance progress toward goals in order to maximize independence and safety at home. Pt requires high level assistance and cuing for completion of exercises in order to provide adequate level of stimulation and perturbation. Author allows pt as much opportunity as possible to perform independent righting strategies, only stepping in when pt is unable to prevent falling to floor. Pt continues to demonstrate progress toward goals AEB progression of some interventions this date either in volume or intensity.     OBJECTIVE IMPAIRMENTS decreased activity tolerance, decreased balance, and decreased strength.   ACTIVITY LIMITATIONS bending, squatting, and stairs  PARTICIPATION LIMITATIONS: cleaning, laundry, community activity, and activities going to events for her grandchildren   PERSONAL FACTORS Age and 1-2 comorbidities: HLD, HTN   are also affecting patient's functional outcome.   REHAB POTENTIAL: Excellent  CLINICAL DECISION MAKING: Stable/uncomplicated  EVALUATION COMPLEXITY: Low  PLAN: PT FREQUENCY: 2x/week  PT DURATION: 12 weeks  PLANNED INTERVENTIONS: Therapeutic exercises, Therapeutic activity, Neuromuscular re-education, Balance training, Gait training, Patient/Family education, Joint mobilization, and Stair training  PLAN FOR NEXT SESSION: continue POC     Particia Lather, PT 02/04/2022, 8:53 AM

## 2022-02-04 NOTE — Therapy (Signed)
OUTPATIENT PHYSICAL THERAPY TREATMENT   Patient Name: Cheryl Joseph MRN: [id number] DOB:13-Dec-1940, 81 y.o., female Today's Date: 02/08/2022   PCP:   Abner Greenspan, MD   REFERRING PROVIDER: Vladimir Crofts, MD   PT End of Session - 02/08/22 1334     Visit Number 4    Number of Visits 24    Date for PT Re-Evaluation 04/21/22    PT Start Time 4270    PT Stop Time 1429    PT Time Calculation (min) 44 min    Equipment Utilized During Treatment Gait belt    Activity Tolerance Patient tolerated treatment well    Behavior During Therapy WFL for tasks assessed/performed               Past Medical History:  Diagnosis Date   Anginal pain ([place])    Arthritis    RA hands(seronegative)   Asthma    Back pain    Cancer ([place])    skin   Cataract    HPV in female 1996   HPV with colposcopy (all neg paps since)   Hyperlipidemia    Hypertension    Incarcerated ventral hernia    Obesity    Osteopenia    Pre-diabetes    Shortness of breath dyspnea    Shoulder pain    Past Surgical History:  Procedure Laterality Date   CATARACT EXTRACTION, BILATERAL Bilateral    COLONOSCOPY  06/01/2004   COLONOSCOPY WITH PROPOFOL N/A 05/01/2015   Procedure: COLONOSCOPY WITH PROPOFOL;  Surgeon: Manya Silvas, MD;  Location: [facility];  Service: Endoscopy;  Laterality: N/A;   CORRECTION HAMMER TOE Right    2nd toe   JOINT REPLACEMENT Right    right knee replacement January 2021   SQUAMOUS CELL CARCINOMA EXCISION Left 12/2017   left arm   TONSILLECTOMY     VAGINAL HYSTERECTOMY  2015   with pelvic organ prolapse surgery    XI ROBOTIC ASSISTED VENTRAL HERNIA N/A 06/10/2021   Procedure: XI ROBOTIC ASSISTED VENTRAL HERNIA;  Surgeon: Herbert Pun, MD;  Location: [facility];  Service: General;  Laterality: N/A;   Patient Active Problem List   Diagnosis Date Noted   Acute bilateral low back pain without sciatica 01/29/2022   Microscopic hematuria 01/29/2022   Abnormal  urine 01/29/2022   History of stroke 62/37/6283   Umbilical hernia 15/17/6160   Frequent stools 01/04/2020   Pre-operative general physical examination 07/06/2019   Lower abdominal pain 08/16/2018   Stress reaction 09/05/2017   Estrogen deficiency 05/25/2016   Routine general medical examination at a health care facility 11/23/2015   Hammertoe 10/28/2015   Frequent loose stools 02/14/2015   Diverticulosis of colon without hemorrhage 02/14/2015   Colon cancer screening 08/19/2014   Abdominal pain, left lower quadrant 03/18/2014   Essential hypertension 02/25/2014   Encounter for Medicare annual wellness exam 07/16/2013   Prediabetes 05/14/2009   Rheumatoid arthritis with positive rheumatoid factor ([place]) 05/13/2008   Osteopenia 03/07/2007   Hyperlipidemia 01/03/2007   Obesity (BMI 30-39.9) 01/03/2007   ALLERGIC RHINITIS, SEASONAL 01/03/2007   ASTHMA 01/03/2007   OVERACTIVE BLADDER 01/03/2007    ONSET DATE: 12/31/21  REFERRING DIAG: V37.10 (ICD-10-CM) - Personal history of transient ischemic attack (TIA), and cerebral infarction without residual deficits  THERAPY DIAG:  Muscle weakness (generalized)  Abnormality of gait and mobility  Difficulty in walking, not elsewhere classified  Rationale for Evaluation and Treatment Rehabilitation  SUBJECTIVE:  SUBJECTIVE STATEMENT: Patient reports she is feeling better. Is walking better. Is going to [place] to visit family.    PERTINENT HISTORY: Pt reports he biggest concern is her balance at the moment. She reports she is not dizzy necessarily but she is off. Pt reports no falls in the last 6 months and she does not utilize an AD at this time. Pt has had PT in the past but balance was not the main focus, pt also has had PT for her R TKA.  With her  balance pt feels when she gets up to walk she feels like she needs to hold onto something. Pt has 20 grandchildren who range from 2 years to 71 years and is also expecting a new great grandchild soon. Pt also enjoys travelling with her husband and tries to exercise for 20 minutes per day with walking or other light activities.   PAIN:  Are you having pain? No  PRECAUTIONS: Fall  WEIGHT BEARING RESTRICTIONS No  FALLS: Has patient fallen in last 6 months? No  LIVING ENVIRONMENT: Lives with: lives with their family and lives with their spouse Lives in: House/apartment Stairs: Yes: External: 3 steps; on right going up Has following equipment at home: Single point cane and Walker - 2 wheeled  PLOF: Independent and Independent with basic ADLs  PATIENT GOALS Improve strength, balance, and confidence with activities  OBJECTIVE:   LOWER EXTREMITY MMT:    MMT Right Eval Left Eval  Hip flexion 4 4+  Hip extension    Hip abduction 4+ 4+  Hip adduction 5 5  Hip internal rotation    Hip external rotation    Knee flexion 4 4+  Knee extension 4 4+  Ankle dorsiflexion 5 5  Ankle plantarflexion 5 5  Ankle inversion    Ankle eversion    (Blank rows = not tested)   TODAY'S TREATMENT:  02/08/22   therEx: Initiated and practiced initial HEP  Sit to stand 10x GTB hamstring curl 15x each LE  RTB march with band around ankle 10x Seated heel toe rocking 10x  NMR:   Airex balance beam:  -lateral stepping 8x lenth of // bars with UE Assist  -tandem walking 4x length of // bars with UE assist   Airex pad: -dual task with word game with visual and reaching scanning x 4 minutes  Bosu ball rounded side up: -lateral modified lunge 10x each LE -modified forward lunge 10x each LE    PATIENT EDUCATION: Education details: Initial HEP  Person educated: Patient Education method: Explanation Education comprehension: verbalized understanding   HOME EXERCISE PROGRAM: Access Code:  [email or web address] URL: [email or web address] Date: 02/04/2022 Prepared by: Rivka Barbara  Exercises - Sit to Stand  - 1 x daily - 7 x weekly - 2 sets - 8 reps - Standing March with Counter Support  - 1 x daily - 7 x weekly - 2 sets - 10 reps - Side Stepping with Counter Support  - 1 x daily - 7 x weekly - 2 sets - 10 reps - Standing in "bowling stance" with counter support   - 1 x daily - 7 x weekly - 2 reps - 30 seocnd hold    GOALS: Goals reviewed with patient? Yes  SHORT TERM GOALS: Target date: 02/24/2022  Patient will be independent in home exercise program to improve strength/mobility for better functional independence with ADLs. Baseline: Goal status: INITIAL    LONG TERM GOALS: Target date: 05/03/2022  Patient will increase FOTO score to  equal to or greater than  68   to demonstrate statistically significant improvement in mobility and quality of life.  Baseline: 63 Goal status: INITIAL  2.  Patient (> 44 years old) will complete five times sit to stand test in < 15 seconds indicating an increased LE strength and improved balance. Baseline: 17.3 s Goal status: INITIAL  3.  Patient will increase Berg Balance score by > 6 points to demonstrate decreased fall risk during functional activities. Baseline: 45 Goal status: INITIAL  4.   Patient will reduce timed up and go to <11 seconds to reduce fall risk and demonstrate improved transfer/gait ability. Baseline: 11.87 Goal status: INITIAL  5.  Patient will increase BLE gross strength to 4+/5 as to improve functional strength for independent gait, increased standing tolerance and increased ADL ability. Baseline: see eval, R side weaker than left grossly  Goal status: INITIAL    ASSESSMENT:  CLINICAL IMPRESSION: Patient is highly motivated throughout session. Tolerates progressive stability and strengthening interventions with minimal LOB. Challenged with mental dual task with stabilization indicating area of  continued focus.  Pt continues to demonstrate progress toward goals AEB progression of some interventions this date either in volume or intensity.     OBJECTIVE IMPAIRMENTS decreased activity tolerance, decreased balance, and decreased strength.   ACTIVITY LIMITATIONS bending, squatting, and stairs  PARTICIPATION LIMITATIONS: cleaning, laundry, community activity, and activities going to events for her grandchildren   PERSONAL FACTORS Age and 1-2 comorbidities: HLD, HTN   are also affecting patient's functional outcome.   REHAB POTENTIAL: Excellent  CLINICAL DECISION MAKING: Stable/uncomplicated  EVALUATION COMPLEXITY: Low  PLAN: PT FREQUENCY: 2x/week  PT DURATION: 12 weeks  PLANNED INTERVENTIONS: Therapeutic exercises, Therapeutic activity, Neuromuscular re-education, Balance training, Gait training, Patient/Family education, Joint mobilization, and Stair training  PLAN FOR NEXT SESSION: continue POC     Janna Arch, PT 02/08/2022, 2:30 PM

## 2022-02-05 ENCOUNTER — Other Ambulatory Visit: Payer: Medicare HMO

## 2022-02-08 ENCOUNTER — Ambulatory Visit: Payer: Medicare HMO

## 2022-02-08 DIAGNOSIS — R262 Difficulty in walking, not elsewhere classified: Secondary | ICD-10-CM

## 2022-02-08 DIAGNOSIS — R269 Unspecified abnormalities of gait and mobility: Secondary | ICD-10-CM | POA: Diagnosis not present

## 2022-02-08 DIAGNOSIS — M6281 Muscle weakness (generalized): Secondary | ICD-10-CM | POA: Diagnosis not present

## 2022-02-08 DIAGNOSIS — R41841 Cognitive communication deficit: Secondary | ICD-10-CM | POA: Diagnosis not present

## 2022-02-08 DIAGNOSIS — R278 Other lack of coordination: Secondary | ICD-10-CM | POA: Diagnosis not present

## 2022-02-09 ENCOUNTER — Encounter: Payer: Medicare HMO | Admitting: Occupational Therapy

## 2022-02-11 ENCOUNTER — Ambulatory Visit: Payer: Medicare HMO

## 2022-02-11 ENCOUNTER — Encounter: Payer: Medicare HMO | Admitting: Occupational Therapy

## 2022-02-11 NOTE — Therapy (Signed)
OUTPATIENT PHYSICAL THERAPY TREATMENT   Patient Name: Cheryl Joseph MRN: [id number] DOB:1940-09-30, 81 y.o., female Today's Date: 02/15/2022   PCP:   Abner Greenspan, MD   REFERRING PROVIDER: Vladimir Crofts, MD   PT End of Session - 02/15/22 0757     Visit Number 5    Number of Visits 24    Date for PT Re-Evaluation 04/21/22    PT Start Time 0800    PT Stop Time 0844    PT Time Calculation (min) 44 min    Equipment Utilized During Treatment Gait belt    Activity Tolerance Patient tolerated treatment well    Behavior During Therapy WFL for tasks assessed/performed                Past Medical History:  Diagnosis Date   Anginal pain ([place])    Arthritis    RA hands(seronegative)   Asthma    Back pain    Cancer ([place])    skin   Cataract    HPV in female 1996   HPV with colposcopy (all neg paps since)   Hyperlipidemia    Hypertension    Incarcerated ventral hernia    Obesity    Osteopenia    Pre-diabetes    Shortness of breath dyspnea    Shoulder pain    Past Surgical History:  Procedure Laterality Date   CATARACT EXTRACTION, BILATERAL Bilateral    COLONOSCOPY  06/01/2004   COLONOSCOPY WITH PROPOFOL N/A 05/01/2015   Procedure: COLONOSCOPY WITH PROPOFOL;  Surgeon: Manya Silvas, MD;  Location: [facility];  Service: Endoscopy;  Laterality: N/A;   CORRECTION HAMMER TOE Right    2nd toe   JOINT REPLACEMENT Right    right knee replacement January 2021   SQUAMOUS CELL CARCINOMA EXCISION Left 12/2017   left arm   TONSILLECTOMY     VAGINAL HYSTERECTOMY  2015   with pelvic organ prolapse surgery    XI ROBOTIC ASSISTED VENTRAL HERNIA N/A 06/10/2021   Procedure: XI ROBOTIC ASSISTED VENTRAL HERNIA;  Surgeon: Herbert Pun, MD;  Location: [facility];  Service: General;  Laterality: N/A;   Patient Active Problem List   Diagnosis Date Noted   Acute bilateral low back pain without sciatica 01/29/2022   Microscopic hematuria 01/29/2022   Abnormal  urine 01/29/2022   History of stroke 59/93/5701   Umbilical hernia 77/93/9030   Frequent stools 01/04/2020   Pre-operative general physical examination 07/06/2019   Lower abdominal pain 08/16/2018   Stress reaction 09/05/2017   Estrogen deficiency 05/25/2016   Routine general medical examination at a health care facility 11/23/2015   Hammertoe 10/28/2015   Frequent loose stools 02/14/2015   Diverticulosis of colon without hemorrhage 02/14/2015   Colon cancer screening 08/19/2014   Abdominal pain, left lower quadrant 03/18/2014   Essential hypertension 02/25/2014   Encounter for Medicare annual wellness exam 07/16/2013   Prediabetes 05/14/2009   Rheumatoid arthritis with positive rheumatoid factor ([place]) 05/13/2008   Osteopenia 03/07/2007   Hyperlipidemia 01/03/2007   Obesity (BMI 30-39.9) 01/03/2007   ALLERGIC RHINITIS, SEASONAL 01/03/2007   ASTHMA 01/03/2007   OVERACTIVE BLADDER 01/03/2007    ONSET DATE: 12/31/21  REFERRING DIAG: S92.33 (ICD-10-CM) - Personal history of transient ischemic attack (TIA), and cerebral infarction without residual deficits  THERAPY DIAG:  Muscle weakness (generalized)  Abnormality of gait and mobility  Difficulty in walking, not elsewhere classified  Rationale for Evaluation and Treatment Rehabilitation  SUBJECTIVE:  SUBJECTIVE STATEMENT: Patient returning from her trip to see her great grandchild's birthday party. Had a good trip.   PERTINENT HISTORY: Pt reports he biggest concern is her balance at the moment. She reports she is not dizzy necessarily but she is off. Pt reports no falls in the last 6 months and she does not utilize an AD at this time. Pt has had PT in the past but balance was not the main focus, pt also has had PT for her R TKA.  With her  balance pt feels when she gets up to walk she feels like she needs to hold onto something. Pt has 20 grandchildren who range from 2 years to 78 years and is also expecting a new great grandchild soon. Pt also enjoys travelling with her husband and tries to exercise for 20 minutes per day with walking or other light activities.   PAIN:  Are you having pain? No  PRECAUTIONS: Fall  WEIGHT BEARING RESTRICTIONS No  FALLS: Has patient fallen in last 6 months? No  LIVING ENVIRONMENT: Lives with: lives with their family and lives with their spouse Lives in: House/apartment Stairs: Yes: External: 3 steps; on right going up Has following equipment at home: Single point cane and Walker - 2 wheeled  PLOF: Independent and Independent with basic ADLs  PATIENT GOALS Improve strength, balance, and confidence with activities  OBJECTIVE:   LOWER EXTREMITY MMT:    MMT Right Eval Left Eval  Hip flexion 4 4+  Hip extension    Hip abduction 4+ 4+  Hip adduction 5 5  Hip internal rotation    Hip external rotation    Knee flexion 4 4+  Knee extension 4 4+  Ankle dorsiflexion 5 5  Ankle plantarflexion 5 5  Ankle inversion    Ankle eversion    (Blank rows = not tested)   TODAY'S TREATMENT:  02/15/22   therEx: Initiated and practiced initial HEP  Nustep Lvl 3 seat position 8 RPM> 50 x4 minutes Sit to stand 10x with weighted ball press-terminated at 8 due to L knee pain  Standing with # 3 ankle weight: CGA for stability  -Hip extension with B upper extremity support, cueing for neutral hip alignment, upright posture for optimal muscle recruitment, and sequencing, 10x each LE,  -Hip abduction with B upper extremity support, cueing for neutral foot alignment for correct muscle activation, 10x each LE -Hip flexion with B upper extremity support, cueing for body mechanics, speed of muscle recruitment for optimal strengthening and stabilization 10x each LE   Seated with # 3 ankle weights   -Seated marches with upright posture, back away from back of chair for abdominal/trunk activation/stabilization, 10x each LE -Seated LAQ with 3 second holds, 10x each LE, cueing for muscle activation and sequencing for neutral alignment -Seated IR/ER with cueing for stabilizing knee placement with lateral foot movement for optimal muscle recruitment, 10x each LE   NMR:   Airex balance beam:  -lateral stepping 4x lenth of // bars with UE Assist  -tandem walking 4x length of // bars with UE assist  -weighted ball chest press 10x, weighted ball straight arm raise   [place] hurdle: -forward/backward step over and back 10x each LE, no UE support -lateral step 10x each LE    PATIENT EDUCATION: Education details: Initial HEP  Person educated: Patient Education method: Explanation Education comprehension: verbalized understanding   HOME EXERCISE PROGRAM: Access Code: [email or web address] URL: [email or web address] Date: 02/04/2022 Prepared by: Rivka Barbara  Exercises - Sit  to Stand  - 1 x daily - 7 x weekly - 2 sets - 8 reps - Standing March with Counter Support  - 1 x daily - 7 x weekly - 2 sets - 10 reps - Side Stepping with Counter Support  - 1 x daily - 7 x weekly - 2 sets - 10 reps - Standing in "bowling stance" with counter support   - 1 x daily - 7 x weekly - 2 reps - 30 seocnd hold    GOALS: Goals reviewed with patient? Yes  SHORT TERM GOALS: Target date: 02/24/2022  Patient will be independent in home exercise program to improve strength/mobility for better functional independence with ADLs. Baseline: Goal status: INITIAL    LONG TERM GOALS: Target date: 05/10/2022  Patient will increase FOTO score to equal to or greater than  68   to demonstrate statistically significant improvement in mobility and quality of life.  Baseline: 63 Goal status: INITIAL  2.  Patient (> 50 years old) will complete five times sit to stand test in < 15 seconds indicating an  increased LE strength and improved balance. Baseline: 17.3 s Goal status: INITIAL  3.  Patient will increase Berg Balance score by > 6 points to demonstrate decreased fall risk during functional activities. Baseline: 45 Goal status: INITIAL  4.   Patient will reduce timed up and go to <11 seconds to reduce fall risk and demonstrate improved transfer/gait ability. Baseline: 11.87 Goal status: INITIAL  5.  Patient will increase BLE gross strength to 4+/5 as to improve functional strength for independent gait, increased standing tolerance and increased ADL ability. Baseline: see eval, R side weaker than left grossly  Goal status: INITIAL    ASSESSMENT:  CLINICAL IMPRESSION: Patient requires cueing for upright posture as she has tendency to flex trunk and look at feet. Her stability on unstable surfaces has greatly improved with good ankle righting reactions and limited LOB. Patient will continue to benefit from skilled physical therapy to improve stability, mobility, and quality of life.      OBJECTIVE IMPAIRMENTS decreased activity tolerance, decreased balance, and decreased strength.   ACTIVITY LIMITATIONS bending, squatting, and stairs  PARTICIPATION LIMITATIONS: cleaning, laundry, community activity, and activities going to events for her grandchildren   PERSONAL FACTORS Age and 1-2 comorbidities: HLD, HTN   are also affecting patient's functional outcome.   REHAB POTENTIAL: Excellent  CLINICAL DECISION MAKING: Stable/uncomplicated  EVALUATION COMPLEXITY: Low  PLAN: PT FREQUENCY: 2x/week  PT DURATION: 12 weeks  PLANNED INTERVENTIONS: Therapeutic exercises, Therapeutic activity, Neuromuscular re-education, Balance training, Gait training, Patient/Family education, Joint mobilization, and Stair training  PLAN FOR NEXT SESSION: continue POC     Janna Arch, PT 02/15/2022, 8:45 AM

## 2022-02-15 ENCOUNTER — Ambulatory Visit: Payer: Medicare HMO

## 2022-02-15 ENCOUNTER — Telehealth: Payer: Self-pay | Admitting: Family Medicine

## 2022-02-15 ENCOUNTER — Encounter: Payer: Self-pay | Admitting: Speech Pathology

## 2022-02-15 ENCOUNTER — Ambulatory Visit: Payer: Medicare HMO | Admitting: Speech Pathology

## 2022-02-15 DIAGNOSIS — R262 Difficulty in walking, not elsewhere classified: Secondary | ICD-10-CM

## 2022-02-15 DIAGNOSIS — R41841 Cognitive communication deficit: Secondary | ICD-10-CM | POA: Diagnosis not present

## 2022-02-15 DIAGNOSIS — R278 Other lack of coordination: Secondary | ICD-10-CM | POA: Diagnosis not present

## 2022-02-15 DIAGNOSIS — M6281 Muscle weakness (generalized): Secondary | ICD-10-CM | POA: Diagnosis not present

## 2022-02-15 DIAGNOSIS — R269 Unspecified abnormalities of gait and mobility: Secondary | ICD-10-CM

## 2022-02-15 NOTE — Telephone Encounter (Signed)
Please cut the crestor in 1/2 and take half pill daily  Let me know if this helps -give it until the end of the week  Thanks for the heads up

## 2022-02-15 NOTE — Telephone Encounter (Signed)
Patient called in stating that rosuvastatin (CRESTOR) 20 MG tablet is giving her a headache. Headache is doing the night mostly, but it is a light headache. Patient stated she was informed to notify if she have any headaches. Please advise. Thank you!

## 2022-02-15 NOTE — Therapy (Signed)
OUTPATIENT SPEECH LANGUAGE PATHOLOGY EVALUATION   Patient Name: Cheryl Joseph MRN: [id number] DOB:Apr 21, 1941, 81 y.o., female Today's Date: 02/15/2022  PCP: Loura Pardon, MD REFERRING PROVIDER: Jennings Books, MD   End of Session - 02/15/22 0906     Visit Number 1    Number of Visits 17    Date for SLP Re-Evaluation 04/16/22    SLP Start Time 0900    SLP Stop Time  1000    SLP Time Calculation (min) 60 min    Activity Tolerance Patient tolerated treatment well             Past Medical History:  Diagnosis Date   Anginal pain ([place])    Arthritis    RA hands(seronegative)   Asthma    Back pain    Cancer ([place])    skin   Cataract    HPV in female 1996   HPV with colposcopy (all neg paps since)   Hyperlipidemia    Hypertension    Incarcerated ventral hernia    Obesity    Osteopenia    Pre-diabetes    Shortness of breath dyspnea    Shoulder pain    Past Surgical History:  Procedure Laterality Date   CATARACT EXTRACTION, BILATERAL Bilateral    COLONOSCOPY  06/01/2004   COLONOSCOPY WITH PROPOFOL N/A 05/01/2015   Procedure: COLONOSCOPY WITH PROPOFOL;  Surgeon: Manya Silvas, MD;  Location: [facility];  Service: Endoscopy;  Laterality: N/A;   CORRECTION HAMMER TOE Right    2nd toe   JOINT REPLACEMENT Right    right knee replacement January 2021   SQUAMOUS CELL CARCINOMA EXCISION Left 12/2017   left arm   TONSILLECTOMY     VAGINAL HYSTERECTOMY  2015   with pelvic organ prolapse surgery    XI ROBOTIC ASSISTED VENTRAL HERNIA N/A 06/10/2021   Procedure: XI ROBOTIC ASSISTED VENTRAL HERNIA;  Surgeon: Herbert Pun, MD;  Location: [facility];  Service: General;  Laterality: N/A;   Patient Active Problem List   Diagnosis Date Noted   Acute bilateral low back pain without sciatica 01/29/2022   Microscopic hematuria 01/29/2022   Abnormal urine 01/29/2022   History of stroke 74/02/1447   Umbilical hernia 18/56/3149   Frequent stools 01/04/2020    Pre-operative general physical examination 07/06/2019   Lower abdominal pain 08/16/2018   Stress reaction 09/05/2017   Estrogen deficiency 05/25/2016   Routine general medical examination at a health care facility 11/23/2015   Hammertoe 10/28/2015   Frequent loose stools 02/14/2015   Diverticulosis of colon without hemorrhage 02/14/2015   Colon cancer screening 08/19/2014   Abdominal pain, left lower quadrant 03/18/2014   Essential hypertension 02/25/2014   Encounter for Medicare annual wellness exam 07/16/2013   Prediabetes 05/14/2009   Rheumatoid arthritis with positive rheumatoid factor ([place]) 05/13/2008   Osteopenia 03/07/2007   Hyperlipidemia 01/03/2007   Obesity (BMI 30-39.9) 01/03/2007   ALLERGIC RHINITIS, SEASONAL 01/03/2007   ASTHMA 01/03/2007   OVERACTIVE BLADDER 01/03/2007    ONSET DATE: 12/31/2021   REFERRING DIAG: CVA  THERAPY DIAG:  Cognitive communication deficit  Rationale for Evaluation and Treatment Rehabilitation  SUBJECTIVE:   SUBJECTIVE STATEMENT:  "They (family) don't sense that it's any worse since the stroke but I feel like it is." Pt accompanied by: self  PERTINENT HISTORY: Patient is an 81 y.o. female with past medical history including HTN, HLD, and asthma who was admitted 12/31/21 with right leg weakness and right arm numbness and was found to have CVA. OT requested  SLP evaluation due to pt reports of wordfinding difficulties.   DIAGNOSTIC FINDINGS: MRI brain 12/31/2021: Small acute perforator infarct in the left basal ganglia.  PAIN:  Are you having pain? No  FALLS: Has patient fallen in last 6 months?  No  LIVING ENVIRONMENT: Lives with: lives with their family Lives in: House/apartment  PLOF:  Level of assistance: Independent with ADLs Employment: Retired   PATIENT GOALS Keep doing my teaching (bible study), not get so frustrated with words  OBJECTIVE:   COGNITION: Overall cognitive status: Within functional limits for tasks  assessed Functional deficits: reports occasionally looses train of thought  AUDITORY COMPREHENSION: Overall auditory comprehension: Appears intact YES/NO questions: Appears intact Following directions: Appears intact Conversation: Complex   READING COMPREHENSION: Intact  EXPRESSION: verbal  VERBAL EXPRESSION: Level of generative/spontaneous verbalization: conversation Automatic speech: name: intact and social response: intact  Repetition: Appears intact Naming: Confrontation: 76-100% and Divergent: 51-75% Pragmatics: Appears intact Comments: hesitation, self-monitors and corrects errors Interfering components:  time pressure, anxiety Effective technique:  extra time Non-verbal means of communication: N/A  WRITTEN EXPRESSION: Dominant hand: right  Written expression: Appears intact  MOTOR SPEECH: Overall motor speech: Appears intact Respiration:  WFL Phonation: normal Resonance: WFL Articulation: Appears intact Intelligibility: Intelligible Motor planning: Appears intact Motor speech errors:  N/a Interfering components:  n/a Effective technique:  n/a   ORAL MOTOR EXAMINATION Facial : WFL Lingual: WFL Velum: WFL Mandible: WFL Cough: WFL Voice: WFL   STANDARDIZED ASSESSMENTS: Boston Naming Test  The [employer] was administered. Pt scored 55/60. This is 0.97 SD above norm of 48.9 for age range of 37-79 (max age of norms).   Number of stimulus cues given: 4 Number of correct responses following a stimulus cue: 0 Number of phonemic cues: 4 Number of correct responses following a phonemic cue: 0 Number of multiple choices given: 1 Number of correct choices: 1  Paraphasias Phonological: 1 Verbal: 0 Neologistic: 0 Multi-word: 0 Perceptual:0     Mini-Addenbrooke's Cognitive Examination - ACE III The Addenbrooke's Cognitive Examination-III (ACE-III) is a brief cognitive test that assesses five cognitive domains. The total score is 100 with  higher scores indicating better cognitive functioning. Cut off scores of 88 and 82 are recommended for suspicion of dementia (88 has sensitivity of 1.00 and specificity of 0.96, 82 has sensitivity of 0.93 and specificity of 1.00). The Mini-ACE-III total score is 30, with higher scores indicating better cognitive performance. American Version B  Attention 4/4  Memory 7/7  Fluency 4/7  Clock Drawing 5/5  Memory Recall 7/7  TOTAL M-ACE- III Score 27/30      PATIENT REPORTED OUTCOME MEASURES (PROM):   The Communication Effectiveness Survey is a patient-reported outcome measure in which the patient rates their own effectiveness in different communication situations. A higher score indicates greater effectiveness.   Pt's self-rating was 23/32. Patient reported frustration with wordfinding difficulties.  The Neuro-QOLT Item Bank v2.0-Cognition Function-Short Form is an eight-item test designed to measure difficulties with cognitive functioning (e.g., memory, attention and decision making or in the application of such abilities to everyday tasks (e.g., planning, organizing, calculating, remembering and learning).  T-SCORE: 44.9; 2.7 SD below mean of 50     PATIENT EDUCATION: Education details: Activities to support cognitive-linguistic function at home, likely brief course of therapy to train pt in wordfinding strategies and compensations Person educated: Patient Education method: Verbal cues Education comprehension: verbalized understanding   GOALS: Goals reviewed with patient? Yes  SHORT TERM GOALS: Target date:  10 sessions  Patient will complete high-level wordfinding tasks >90% acc with rare min cues.  Baseline: Goal status: INITIAL  2.  Patient will use semantic feature analysis to generate 4+ descriptions for targets >90% acc.  Baseline:  Goal status: INITIAL  3.  Patient will use anomia compensations in 10 minutes mod complex conversation with rare min cues.  Baseline:   Goal status: INITIAL   LONG TERM GOALS: Target date: 04/16/2022  Patient will use anomia compensations in complex conversation of 15-20 minutes with modified independence. Baseline:  Goal status: INITIAL  2.  Patient will report successful use of strategies (anomia compensations, pre-planning) to improve communication in community activities over 3 consecutive visits. Baseline:  Goal status: INITIAL  3.  Pt will report improved communication effectiveness as measured by the Communication Effectiveness Survey.  Baseline:  Goal status: INITIAL    ASSESSMENT:  CLINICAL IMPRESSION: Cheryl Joseph presents with mild verbal expression impairment characterized by changes in wordfinding and thought organization abilities since her CVA. Patient scored within normal limits on standardized testing, although verbalized frustration with hesitations, and had lower fluency score than expected with divergent naming. 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   OBJECTIVE IMPAIRMENTS include expressive language. These impairments are limiting patient from effectively communicating at home and in community. Factors affecting potential to achieve goals and functional outcome are ability to learn/carryover information and previous level of function. Patient will benefit from skilled SLP services to address above impairments and improve overall function.  REHAB POTENTIAL: Excellent  PLAN: SLP FREQUENCY: 1-2x/week  SLP DURATION: 8 weeks  PLANNED INTERVENTIONS: Language facilitation, Environmental controls, Cueing hierachy, Cognitive reorganization, Internal/external aids, Functional tasks, Multimodal  communication approach, SLP instruction and feedback, Compensatory strategies, and Patient/family education      Deneise Lever, MS, Actor [phone]

## 2022-02-15 NOTE — Telephone Encounter (Signed)
Patient notified as instructed by telephone and verbalized understanding. 

## 2022-02-16 ENCOUNTER — Encounter: Payer: Medicare HMO | Admitting: Occupational Therapy

## 2022-02-16 ENCOUNTER — Telehealth: Payer: Self-pay

## 2022-02-16 NOTE — Therapy (Signed)
OUTPATIENT PHYSICAL THERAPY TREATMENT   Patient Name: Cheryl Joseph MRN: [id number] DOB:1941-05-20, 81 y.o., female Today's Date: 02/17/2022   PCP:   Abner Greenspan, MD   REFERRING PROVIDER: Vladimir Crofts, MD   PT End of Session - 02/17/22 1342     Visit Number 6    Number of Visits 24    Date for PT Re-Evaluation 04/21/22    PT Start Time 6440    PT Stop Time 1429    PT Time Calculation (min) 44 min    Equipment Utilized During Treatment Gait belt    Activity Tolerance Patient tolerated treatment well    Behavior During Therapy WFL for tasks assessed/performed                 Past Medical History:  Diagnosis Date   Anginal pain ([place])    Arthritis    RA hands(seronegative)   Asthma    Back pain    Cancer ([place])    skin   Cataract    HPV in female 1996   HPV with colposcopy (all neg paps since)   Hyperlipidemia    Hypertension    Incarcerated ventral hernia    Obesity    Osteopenia    Pre-diabetes    Shortness of breath dyspnea    Shoulder pain    Past Surgical History:  Procedure Laterality Date   CATARACT EXTRACTION, BILATERAL Bilateral    COLONOSCOPY  06/01/2004   COLONOSCOPY WITH PROPOFOL N/A 05/01/2015   Procedure: COLONOSCOPY WITH PROPOFOL;  Surgeon: Manya Silvas, MD;  Location: [facility];  Service: Endoscopy;  Laterality: N/A;   CORRECTION HAMMER TOE Right    2nd toe   JOINT REPLACEMENT Right    right knee replacement January 2021   SQUAMOUS CELL CARCINOMA EXCISION Left 12/2017   left arm   TONSILLECTOMY     VAGINAL HYSTERECTOMY  2015   with pelvic organ prolapse surgery    XI ROBOTIC ASSISTED VENTRAL HERNIA N/A 06/10/2021   Procedure: XI ROBOTIC ASSISTED VENTRAL HERNIA;  Surgeon: Herbert Pun, MD;  Location: [facility];  Service: General;  Laterality: N/A;   Patient Active Problem List   Diagnosis Date Noted   Acute bilateral low back pain without sciatica 01/29/2022   Microscopic hematuria 01/29/2022   Abnormal  urine 01/29/2022   History of stroke 34/74/2595   Umbilical hernia 63/87/5643   Frequent stools 01/04/2020   Pre-operative general physical examination 07/06/2019   Lower abdominal pain 08/16/2018   Stress reaction 09/05/2017   Estrogen deficiency 05/25/2016   Routine general medical examination at a health care facility 11/23/2015   Hammertoe 10/28/2015   Frequent loose stools 02/14/2015   Diverticulosis of colon without hemorrhage 02/14/2015   Colon cancer screening 08/19/2014   Abdominal pain, left lower quadrant 03/18/2014   Essential hypertension 02/25/2014   Encounter for Medicare annual wellness exam 07/16/2013   Prediabetes 05/14/2009   Rheumatoid arthritis with positive rheumatoid factor ([place]) 05/13/2008   Osteopenia 03/07/2007   Hyperlipidemia 01/03/2007   Obesity (BMI 30-39.9) 01/03/2007   ALLERGIC RHINITIS, SEASONAL 01/03/2007   ASTHMA 01/03/2007   OVERACTIVE BLADDER 01/03/2007    ONSET DATE: 12/31/21  REFERRING DIAG: P29.51 (ICD-10-CM) - Personal history of transient ischemic attack (TIA), and cerebral infarction without residual deficits  THERAPY DIAG:  Muscle weakness (generalized)  Abnormality of gait and mobility  Difficulty in walking, not elsewhere classified  Rationale for Evaluation and Treatment Rehabilitation  SUBJECTIVE:  SUBJECTIVE STATEMENT: Patient reports her stomach is bothering her.   PERTINENT HISTORY: Pt reports he biggest concern is her balance at the moment. She reports she is not dizzy necessarily but she is off. Pt reports no falls in the last 6 months and she does not utilize an AD at this time. Pt has had PT in the past but balance was not the main focus, pt also has had PT for her R TKA.  With her balance pt feels when she gets up to walk she feels  like she needs to hold onto something. Pt has 20 grandchildren who range from 2 years to 9 years and is also expecting a new great grandchild soon. Pt also enjoys travelling with her husband and tries to exercise for 20 minutes per day with walking or other light activities.   PAIN:  Are you having pain? No  PRECAUTIONS: Fall  WEIGHT BEARING RESTRICTIONS No  FALLS: Has patient fallen in last 6 months? No  LIVING ENVIRONMENT: Lives with: lives with their family and lives with their spouse Lives in: House/apartment Stairs: Yes: External: 3 steps; on right going up Has following equipment at home: Single point cane and Walker - 2 wheeled  PLOF: Independent and Independent with basic ADLs  PATIENT GOALS Improve strength, balance, and confidence with activities  OBJECTIVE:   LOWER EXTREMITY MMT:    MMT Right Eval Left Eval  Hip flexion 4 4+  Hip extension    Hip abduction 4+ 4+  Hip adduction 5 5  Hip internal rotation    Hip external rotation    Knee flexion 4 4+  Knee extension 4 4+  Ankle dorsiflexion 5 5  Ankle plantarflexion 5 5  Ankle inversion    Ankle eversion    (Blank rows = not tested)   TODAY'S TREATMENT:  02/17/22   therEx: Initiated and practiced initial HEP  Nustep Lvl 3 seat position 8 RPM> 50 x4 minutes Sit to stand 10x with weighted ball press-terminated at 8 due to L knee pain  Standing with # 4 ankle weight: CGA for stability  -Hip extension with B upper extremity support, cueing for neutral hip alignment, upright posture for optimal muscle recruitment, and sequencing, 10x each LE,  -Hip abduction with B upper extremity support, cueing for neutral foot alignment for correct muscle activation, 10x each LE -Hip flexion with B upper extremity support, cueing for body mechanics, speed of muscle recruitment for optimal strengthening and stabilization 10x each LE -hamstring curl 10x each LE; BUE support  Seated with # 4 ankle weights  -Seated  marches with upright posture, back away from back of chair for abdominal/trunk activation/stabilization, 10x each LE -Seated LAQ with 3 second holds, 10x each LE, cueing for muscle activation and sequencing for neutral alignment -Seated IR/ER with cueing for stabilizing knee placement with lateral foot movement for optimal muscle recruitment, 10x each LE  Seated adduction ball squeeze 15x  Seated adduction ball squeeze with LAQ 10x Seated hamstring lengthening stretch 30 seconds each LE Seated prostretch 20x  NMR:  Walking in hallway with vertical ball toss 2x 86 ft Walking hallway bouncing ball 2x 86 ft     PATIENT EDUCATION: Education details: Initial HEP  Person educated: Patient Education method: Explanation Education comprehension: verbalized understanding   HOME EXERCISE PROGRAM: Access Code: [email or web address] URL: [email or web address] Date: 02/04/2022 Prepared by: Rivka Barbara  Exercises - Sit to Stand  - 1 x daily - 7 x weekly - 2 sets - 8 reps -  Standing March with Counter Support  - 1 x daily - 7 x weekly - 2 sets - 10 reps - Side Stepping with Counter Support  - 1 x daily - 7 x weekly - 2 sets - 10 reps - Standing in "bowling stance" with counter support   - 1 x daily - 7 x weekly - 2 reps - 30 seocnd hold    GOALS: Goals reviewed with patient? Yes  SHORT TERM GOALS: Target date: 02/24/2022  Patient will be independent in home exercise program to improve strength/mobility for better functional independence with ADLs. Baseline: Goal status: INITIAL    LONG TERM GOALS: Target date: 05/12/2022  Patient will increase FOTO score to equal to or greater than  68   to demonstrate statistically significant improvement in mobility and quality of life.  Baseline: 63 Goal status: INITIAL  2.  Patient (> 43 years old) will complete five times sit to stand test in < 15 seconds indicating an increased LE strength and improved balance. Baseline: 17.3 s Goal  status: INITIAL  3.  Patient will increase Berg Balance score by > 6 points to demonstrate decreased fall risk during functional activities. Baseline: 45 Goal status: INITIAL  4.   Patient will reduce timed up and go to <11 seconds to reduce fall risk and demonstrate improved transfer/gait ability. Baseline: 11.87 Goal status: INITIAL  5.  Patient will increase BLE gross strength to 4+/5 as to improve functional strength for independent gait, increased standing tolerance and increased ADL ability. Baseline: see eval, R side weaker than left grossly  Goal status: INITIAL    ASSESSMENT:  CLINICAL IMPRESSION: Patient is challenged with dual task with ambulation resulting in increased instability. Patient is rapidly progressing towards goals at this time and is more independent and safe with mobility each session.  Progression of weight for ankle weights tolerated well. Patient will continue to benefit from skilled physical therapy to improve stability, mobility, and quality of life.      OBJECTIVE IMPAIRMENTS decreased activity tolerance, decreased balance, and decreased strength.   ACTIVITY LIMITATIONS bending, squatting, and stairs  PARTICIPATION LIMITATIONS: cleaning, laundry, community activity, and activities going to events for her grandchildren   PERSONAL FACTORS Age and 1-2 comorbidities: HLD, HTN   are also affecting patient's functional outcome.   REHAB POTENTIAL: Excellent  CLINICAL DECISION MAKING: Stable/uncomplicated  EVALUATION COMPLEXITY: Low  PLAN: PT FREQUENCY: 2x/week  PT DURATION: 12 weeks  PLANNED INTERVENTIONS: Therapeutic exercises, Therapeutic activity, Neuromuscular re-education, Balance training, Gait training, Patient/Family education, Joint mobilization, and Stair training  PLAN FOR NEXT SESSION: continue POC     Janna Arch, PT 02/17/2022, 2:29 PM

## 2022-02-16 NOTE — Telephone Encounter (Signed)
  Care Management   Follow Up Note   02/16/2022 Name: Cheryl Joseph MRN: [id number] DOB: [birth year]   Referred by: Tower, Wynelle Fanny, MD Reason for referral : Care Coordination   An unsuccessful telephone outreach was attempted today. The patient was referred to the case management team for assistance with care management and care coordination.   Follow Up Plan: The care management team will reach out to the patient again over the next 14 days.   Quinn Plowman RN,BSN,CCM RN Care Manager Coordinator [place] [phone]

## 2022-02-17 ENCOUNTER — Ambulatory Visit: Payer: Medicare HMO

## 2022-02-17 ENCOUNTER — Ambulatory Visit (INDEPENDENT_AMBULATORY_CARE_PROVIDER_SITE_OTHER): Payer: Medicare HMO

## 2022-02-17 DIAGNOSIS — K5730 Diverticulosis of large intestine without perforation or abscess without bleeding: Secondary | ICD-10-CM

## 2022-02-17 DIAGNOSIS — I10 Essential (primary) hypertension: Secondary | ICD-10-CM

## 2022-02-17 DIAGNOSIS — R269 Unspecified abnormalities of gait and mobility: Secondary | ICD-10-CM

## 2022-02-17 DIAGNOSIS — R278 Other lack of coordination: Secondary | ICD-10-CM | POA: Diagnosis not present

## 2022-02-17 DIAGNOSIS — R41841 Cognitive communication deficit: Secondary | ICD-10-CM | POA: Diagnosis not present

## 2022-02-17 DIAGNOSIS — E785 Hyperlipidemia, unspecified: Secondary | ICD-10-CM

## 2022-02-17 DIAGNOSIS — R262 Difficulty in walking, not elsewhere classified: Secondary | ICD-10-CM

## 2022-02-17 DIAGNOSIS — M6281 Muscle weakness (generalized): Secondary | ICD-10-CM

## 2022-02-17 DIAGNOSIS — Z8673 Personal history of transient ischemic attack (TIA), and cerebral infarction without residual deficits: Secondary | ICD-10-CM

## 2022-02-17 NOTE — Chronic Care Management (AMB) (Signed)
Care Management    RN Visit Note  02/17/2022 Name: KAOIR LOREE MRN: [id number] DOB: 1940-11-04  Subjective: SHALAH ESTELLE is a 81 y.o. year old female who is a primary care patient of Tower, Wynelle Fanny, MD. The care management team was consulted for assistance with disease management and care coordination needs.    Engaged with patient by telephone for follow up visit in response to provider referral for case management and/or care coordination services.   Consent to Services:   Ms. Deadwyler was given information about Care Management services today including:  Care Management services includes personalized support from designated clinical staff supervised by her physician, including individualized plan of care and coordination with other care providers 24/7 contact phone numbers for assistance for urgent and routine care needs. The patient may stop case management services at any time by phone call to the office staff.  Patient agreed to services and consent obtained.   Assessment: Review of patient past medical history, allergies, medications, health status, including review of consultants reports, laboratory and other test data, was performed as part of comprehensive evaluation and provision of chronic care management services.   SDOH (Social Determinants of Health) assessments and interventions performed:    Care Plan  Allergies  Allergen Reactions   Tolterodine Tartrate Other (See Comments)    Unknown (back ache)   Vesicare [Solifenacin Succinate]     Eye problems     Outpatient Encounter Medications as of 02/17/2022  Medication Sig Note   acetaminophen (TYLENOL) 500 MG tablet Take 1,000 mg by mouth every 6 (six) hours as needed for mild pain.    albuterol (VENTOLIN HFA) 108 (90 Base) MCG/ACT inhaler INHALE TWO PUFFS BY MOUTH EVERY 4 HOURS AS NEEDED FOR WHEEZING AND 2 PUFFS BEFORE EXERCISE OR EXPOSURE TO COLD AIR    amLODipine (NORVASC) 5 MG tablet Hold until followup  with PCP.    aspirin EC 81 MG tablet Take 1 tablet (81 mg total) by mouth daily. Swallow whole.    atorvastatin (LIPITOR) 80 MG tablet Take 1 tablet (80 mg total) by mouth daily. 01/27/2022: On hold currently 01/27/22   b complex vitamins tablet Take 1 tablet by mouth every Monday, Wednesday, and Friday.    calcium carbonate (OS-CAL - DOSED IN MG OF ELEMENTAL CALCIUM) 1250 (500 Ca) MG tablet Take 2 tablets by mouth daily with breakfast.    Cholecalciferol (VITAMIN D3 PO) Take 2 tablets by mouth daily.    FLUZONE HIGH-DOSE QUADRIVALENT 0.7 ML SUSY     Hyaluronic Acid-Vitamin C (HYALURONIC ACID PO) Take 1 tablet by mouth every Monday, Wednesday, and Friday.    montelukast (SINGULAIR) 10 MG tablet Take 1 tablet (10 mg total) by mouth at bedtime.    Multiple Vitamin (MULTIVITAMIN) capsule Take 1 capsule by mouth daily. Reported on 12/18/2015    Omega-3 Fatty Acids (FISH OIL PO) Take 1 capsule by mouth daily. Reported on 12/18/2015    OVER THE COUNTER MEDICATION Take 2 capsules by mouth daily. BONE UP    oxybutynin (OXYTROL) 3.9 MG/24HR Place 1 patch onto the skin once a week. Sunday.  Home med.    Probiotic Product (PROBIOTIC DAILY PO) Take 1 tablet by mouth daily.    psyllium (METAMUCIL SMOOTH TEXTURE) 28 % packet Take 1 packet by mouth daily.    Pumpkin Seed-Soy Germ (AZO BLADDER CONTROL/GO-LESS PO) Take 1 capsule by mouth at bedtime.    rosuvastatin (CRESTOR) 20 MG tablet Take 1 tablet (20 mg total) by   Care Management    RN Visit Note  02/17/2022 Name: Cortny P Manseau MRN: [id number] DOB: 07/02/1941  Subjective: Tearra P Tumolo is a 81 y.o. year old female who is a primary care patient of Tower, Marne A, MD. The care management team was consulted for assistance with disease management and care coordination needs.    Engaged with patient by telephone for follow up visit in response to provider referral for case management and/or care coordination services.   Consent to Services:   Ms. Rivere was given information about Care Management services today including:  Care Management services includes personalized support from designated clinical staff supervised by her physician, including individualized plan of care and coordination with other care providers 24/7 contact phone numbers for assistance for urgent and routine care needs. The patient may stop case management services at any time by phone call to the office staff.  Patient agreed to services and consent obtained.   Assessment: Review of patient past medical history, allergies, medications, health status, including review of consultants reports, laboratory and other test data, was performed as part of comprehensive evaluation and provision of chronic care management services.   SDOH (Social Determinants of Health) assessments and interventions performed:    Care Plan  Allergies  Allergen Reactions   Tolterodine Tartrate Other (See Comments)    Unknown (back ache)   Vesicare [Solifenacin Succinate]     Eye problems     Outpatient Encounter Medications as of 02/17/2022  Medication Sig Note   acetaminophen (TYLENOL) 500 MG tablet Take 1,000 mg by mouth every 6 (six) hours as needed for mild pain.    albuterol (VENTOLIN HFA) 108 (90 Base) MCG/ACT inhaler INHALE TWO PUFFS BY MOUTH EVERY 4 HOURS AS NEEDED FOR WHEEZING AND 2 PUFFS BEFORE EXERCISE OR EXPOSURE TO COLD AIR    amLODipine (NORVASC) 5 MG tablet Hold until followup  with PCP.    aspirin EC 81 MG tablet Take 1 tablet (81 mg total) by mouth daily. Swallow whole.    atorvastatin (LIPITOR) 80 MG tablet Take 1 tablet (80 mg total) by mouth daily. 01/27/2022: On hold currently 01/27/22   b complex vitamins tablet Take 1 tablet by mouth every Monday, Wednesday, and Friday.    calcium carbonate (OS-CAL - DOSED IN MG OF ELEMENTAL CALCIUM) 1250 (500 Ca) MG tablet Take 2 tablets by mouth daily with breakfast.    Cholecalciferol (VITAMIN D3 PO) Take 2 tablets by mouth daily.    FLUZONE HIGH-DOSE QUADRIVALENT 0.7 ML SUSY     Hyaluronic Acid-Vitamin C (HYALURONIC ACID PO) Take 1 tablet by mouth every Monday, Wednesday, and Friday.    montelukast (SINGULAIR) 10 MG tablet Take 1 tablet (10 mg total) by mouth at bedtime.    Multiple Vitamin (MULTIVITAMIN) capsule Take 1 capsule by mouth daily. Reported on 12/18/2015    Omega-3 Fatty Acids (FISH OIL PO) Take 1 capsule by mouth daily. Reported on 12/18/2015    OVER THE COUNTER MEDICATION Take 2 capsules by mouth daily. BONE UP    oxybutynin (OXYTROL) 3.9 MG/24HR Place 1 patch onto the skin once a week. Sunday.  Home med.    Probiotic Product (PROBIOTIC DAILY PO) Take 1 tablet by mouth daily.    psyllium (METAMUCIL SMOOTH TEXTURE) 28 % packet Take 1 packet by mouth daily.    Pumpkin Seed-Soy Germ (AZO BLADDER CONTROL/GO-LESS PO) Take 1 capsule by mouth at bedtime.    rosuvastatin (CRESTOR) 20 MG tablet Take 1 tablet (20 mg total) by    Care Management    RN Visit Note  02/17/2022 Name: Asia P Pizzo MRN: [id number] DOB: 05/26/1941  Subjective: Vegas P Blethen is a 81 y.o. year old female who is a primary care patient of Tower, Marne A, MD. The care management team was consulted for assistance with disease management and care coordination needs.    Engaged with patient by telephone for follow up visit in response to provider referral for case management and/or care coordination services.   Consent to Services:   Ms. Narciso was given information about Care Management services today including:  Care Management services includes personalized support from designated clinical staff supervised by her physician, including individualized plan of care and coordination with other care providers 24/7 contact phone numbers for assistance for urgent and routine care needs. The patient may stop case management services at any time by phone call to the office staff.  Patient agreed to services and consent obtained.   Assessment: Review of patient past medical history, allergies, medications, health status, including review of consultants reports, laboratory and other test data, was performed as part of comprehensive evaluation and provision of chronic care management services.   SDOH (Social Determinants of Health) assessments and interventions performed:    Care Plan  Allergies  Allergen Reactions   Tolterodine Tartrate Other (See Comments)    Unknown (back ache)   Vesicare [Solifenacin Succinate]     Eye problems     Outpatient Encounter Medications as of 02/17/2022  Medication Sig Note   acetaminophen (TYLENOL) 500 MG tablet Take 1,000 mg by mouth every 6 (six) hours as needed for mild pain.    albuterol (VENTOLIN HFA) 108 (90 Base) MCG/ACT inhaler INHALE TWO PUFFS BY MOUTH EVERY 4 HOURS AS NEEDED FOR WHEEZING AND 2 PUFFS BEFORE EXERCISE OR EXPOSURE TO COLD AIR    amLODipine (NORVASC) 5 MG tablet Hold until followup  with PCP.    aspirin EC 81 MG tablet Take 1 tablet (81 mg total) by mouth daily. Swallow whole.    atorvastatin (LIPITOR) 80 MG tablet Take 1 tablet (80 mg total) by mouth daily. 01/27/2022: On hold currently 01/27/22   b complex vitamins tablet Take 1 tablet by mouth every Monday, Wednesday, and Friday.    calcium carbonate (OS-CAL - DOSED IN MG OF ELEMENTAL CALCIUM) 1250 (500 Ca) MG tablet Take 2 tablets by mouth daily with breakfast.    Cholecalciferol (VITAMIN D3 PO) Take 2 tablets by mouth daily.    FLUZONE HIGH-DOSE QUADRIVALENT 0.7 ML SUSY     Hyaluronic Acid-Vitamin C (HYALURONIC ACID PO) Take 1 tablet by mouth every Monday, Wednesday, and Friday.    montelukast (SINGULAIR) 10 MG tablet Take 1 tablet (10 mg total) by mouth at bedtime.    Multiple Vitamin (MULTIVITAMIN) capsule Take 1 capsule by mouth daily. Reported on 12/18/2015    Omega-3 Fatty Acids (FISH OIL PO) Take 1 capsule by mouth daily. Reported on 12/18/2015    OVER THE COUNTER MEDICATION Take 2 capsules by mouth daily. BONE UP    oxybutynin (OXYTROL) 3.9 MG/24HR Place 1 patch onto the skin once a week. Sunday.  Home med.    Probiotic Product (PROBIOTIC DAILY PO) Take 1 tablet by mouth daily.    psyllium (METAMUCIL SMOOTH TEXTURE) 28 % packet Take 1 packet by mouth daily.    Pumpkin Seed-Soy Germ (AZO BLADDER CONTROL/GO-LESS PO) Take 1 capsule by mouth at bedtime.    rosuvastatin (CRESTOR) 20 MG tablet Take 1 tablet (20 mg total) by

## 2022-02-17 NOTE — Patient Instructions (Signed)
Visit Information  [facility] Congratulations on achieving your goals! It was a pleasure working with you, and I hope you continue to make great strides in improving your health.   Thank you for taking time to visit with me today. Please don't hesitate to contact me if I can be of further assistance to you In the future. Or contact your primary care provider office,  Please remember this advisement:   Continue to take medications as prescribed and refill timely.  Attend all scheduled provider appointments  Call provider office for new concerns or questions  - check blood pressure daily and record - Continue to eat more whole grains, fruits and vegetables, lean meats and healthy fats Continue to follow a low salt diet.  Contact your neurologist and notify him of your ongoing headaches and symptoms of " seeing things."  Remain active with exercise as tolerated and as recommended by provider:  reading, crossword puzzles, exercise   If you are experiencing a Mental Health or [place] or need someone to talk to, please call the Suicide and Crisis Lifeline: 988 call 1-800-273-TALK (toll free, 24 hour hotline)   Patient verbalizes understanding of instructions and care plan provided today and agrees to view in [place]. Active MyChart status and patient understanding of how to access instructions and care plan via MyChart confirmed with patient.      Quinn Plowman RN,BSN,CCM RN Care Manager Coordinator [place]  [phone]

## 2022-02-18 ENCOUNTER — Ambulatory Visit: Payer: Medicare HMO | Admitting: Physical Therapy

## 2022-02-18 ENCOUNTER — Telehealth: Payer: Medicare HMO

## 2022-02-18 ENCOUNTER — Encounter: Payer: Medicare HMO | Admitting: Occupational Therapy

## 2022-02-19 NOTE — Telephone Encounter (Signed)
Did you want see back for an appt.

## 2022-02-19 NOTE — Telephone Encounter (Signed)
Patient called back with update, she said that she is still having slight headache but its not bad, she said if she gets hot or stressed she immediately gets a headache but she dont know if its because of the medicine. She wants to know if you want her to continue hal a tablet or go back to whole. Please advise. Callback [phone]

## 2022-02-19 NOTE — Telephone Encounter (Signed)
Hold crestor entirely for the next 5 days. I want to know for sure this is the cause. Then let us know next week how the headache is so we can decide what to do from there  Thanks

## 2022-02-19 NOTE — Telephone Encounter (Signed)
Called and spoke with pt informed patient of this patient , is going to try this and call us back with an update.

## 2022-02-21 NOTE — Therapy (Signed)
OUTPATIENT PHYSICAL THERAPY TREATMENT   Patient Name: Cheryl Joseph MRN: [id number] DOB:May 15, 1941, 81 y.o., female Today's Date: 02/22/2022   PCP:   Abner Greenspan, MD   REFERRING PROVIDER: Vladimir Crofts, MD   PT End of Session - 02/22/22 1329     Visit Number 7    Number of Visits 24    Date for PT Re-Evaluation 04/21/22    PT Start Time 1340    PT Stop Time 1425    PT Time Calculation (min) 45 min    Equipment Utilized During Treatment Gait belt    Activity Tolerance Patient tolerated treatment well    Behavior During Therapy WFL for tasks assessed/performed                  Past Medical History:  Diagnosis Date   Anginal pain ([place])    Arthritis    RA hands(seronegative)   Asthma    Back pain    Cancer ([place])    skin   Cataract    HPV in female 1996   HPV with colposcopy (all neg paps since)   Hyperlipidemia    Hypertension    Incarcerated ventral hernia    Obesity    Osteopenia    Pre-diabetes    Shortness of breath dyspnea    Shoulder pain    Past Surgical History:  Procedure Laterality Date   CATARACT EXTRACTION, BILATERAL Bilateral    COLONOSCOPY  06/01/2004   COLONOSCOPY WITH PROPOFOL N/A 05/01/2015   Procedure: COLONOSCOPY WITH PROPOFOL;  Surgeon: Manya Silvas, MD;  Location: [facility];  Service: Endoscopy;  Laterality: N/A;   CORRECTION HAMMER TOE Right    2nd toe   JOINT REPLACEMENT Right    right knee replacement January 2021   SQUAMOUS CELL CARCINOMA EXCISION Left 12/2017   left arm   TONSILLECTOMY     VAGINAL HYSTERECTOMY  2015   with pelvic organ prolapse surgery    XI ROBOTIC ASSISTED VENTRAL HERNIA N/A 06/10/2021   Procedure: XI ROBOTIC ASSISTED VENTRAL HERNIA;  Surgeon: Herbert Pun, MD;  Location: [facility];  Service: General;  Laterality: N/A;   Patient Active Problem List   Diagnosis Date Noted   Acute bilateral low back pain without sciatica 01/29/2022   Microscopic hematuria 01/29/2022    Abnormal urine 01/29/2022   History of stroke 78/67/6720   Umbilical hernia 94/70/9628   Frequent stools 01/04/2020   Pre-operative general physical examination 07/06/2019   Lower abdominal pain 08/16/2018   Stress reaction 09/05/2017   Estrogen deficiency 05/25/2016   Routine general medical examination at a health care facility 11/23/2015   Hammertoe 10/28/2015   Frequent loose stools 02/14/2015   Diverticulosis of colon without hemorrhage 02/14/2015   Colon cancer screening 08/19/2014   Abdominal pain, left lower quadrant 03/18/2014   Essential hypertension 02/25/2014   Encounter for Medicare annual wellness exam 07/16/2013   Prediabetes 05/14/2009   Rheumatoid arthritis with positive rheumatoid factor ([place]) 05/13/2008   Osteopenia 03/07/2007   Hyperlipidemia 01/03/2007   Obesity (BMI 30-39.9) 01/03/2007   ALLERGIC RHINITIS, SEASONAL 01/03/2007   ASTHMA 01/03/2007   OVERACTIVE BLADDER 01/03/2007    ONSET DATE: 12/31/21  REFERRING DIAG: Z66.29 (ICD-10-CM) - Personal history of transient ischemic attack (TIA), and cerebral infarction without residual deficits  THERAPY DIAG:  Muscle weakness (generalized)  Abnormality of gait and mobility  Difficulty in walking, not elsewhere classified  Rationale for Evaluation and Treatment Rehabilitation  SUBJECTIVE:  SUBJECTIVE STATEMENT: Patient reports she overdid it this morning cooking and feeling like she was going to pass out.   PERTINENT HISTORY: Pt reports he biggest concern is her balance at the moment. She reports she is not dizzy necessarily but she is off. Pt reports no falls in the last 6 months and she does not utilize an AD at this time. Pt has had PT in the past but balance was not the main focus, pt also has had PT for her R TKA.   With her balance pt feels when she gets up to walk she feels like she needs to hold onto something. Pt has 20 grandchildren who range from 2 years to 36 years and is also expecting a new great grandchild soon. Pt also enjoys travelling with her husband and tries to exercise for 20 minutes per day with walking or other light activities.   PAIN:  Are you having pain? No  PRECAUTIONS: Fall  WEIGHT BEARING RESTRICTIONS No  FALLS: Has patient fallen in last 6 months? No  LIVING ENVIRONMENT: Lives with: lives with their family and lives with their spouse Lives in: House/apartment Stairs: Yes: External: 3 steps; on right going up Has following equipment at home: Single point cane and Walker - 2 wheeled  PLOF: Independent and Independent with basic ADLs  PATIENT GOALS Improve strength, balance, and confidence with activities  OBJECTIVE:   LOWER EXTREMITY MMT:    MMT Right Eval Left Eval  Hip flexion 4 4+  Hip extension    Hip abduction 4+ 4+  Hip adduction 5 5  Hip internal rotation    Hip external rotation    Knee flexion 4 4+  Knee extension 4 4+  Ankle dorsiflexion 5 5  Ankle plantarflexion 5 5  Ankle inversion    Ankle eversion    (Blank rows = not tested)   TODAY'S TREATMENT:  02/22/22 BP: seated 135/58 Standing: 128/68  therEx: Initiated and practiced initial HEP  Nustep Lvl 3 seat position 8 RPM> 50 x4 minutes Sit to stand 10x with weighted ball press-terminated at 8 due to L knee pain  Standing with # 4 ankle weight: CGA for stability -Hip flexion with B upper extremity support, cueing for body mechanics, speed of muscle recruitment for optimal strengthening and stabilization 10x each LE   Seated with # 4 ankle weights  -Seated marches with upright posture, back away from back of chair for abdominal/trunk activation/stabilization, 10x each LE -Seated LAQ with 3 second holds, 10x each LE, cueing for muscle activation and sequencing for neutral  alignment -Seated IR/ER with cueing for stabilizing knee placement with lateral foot movement for optimal muscle recruitment, 10x each LE  RTB march with band across toes 10x   NMR:  Walking with ice cream cone in hand: -walk with cone in hand 40 ft normal gait -40 ft horizontal head turns -40 ft vertical head turns -pass between hands 50 ft -ambulate with eyes closed with it in hand 40 ft   Lateral step over orange hurdle and back 15x each side Single leg step over orange hurdle 10x each LE  Airex pad: reaching for letters with dual task word game with visual scan x 3 minutes  PATIENT EDUCATION: Education details: Initial HEP  Person educated: Patient Education method: Explanation Education comprehension: verbalized understanding   HOME EXERCISE PROGRAM: Access Code: [email or web address] URL: [email or web address] Date: 02/04/2022 Prepared by: Rivka Barbara  Exercises - Sit to Stand  - 1 x daily - 7 x weekly -  2 sets - 8 reps - Standing March with Counter Support  - 1 x daily - 7 x weekly - 2 sets - 10 reps - Side Stepping with Counter Support  - 1 x daily - 7 x weekly - 2 sets - 10 reps - Standing in "bowling stance" with counter support   - 1 x daily - 7 x weekly - 2 reps - 30 seocnd hold    GOALS: Goals reviewed with patient? Yes  SHORT TERM GOALS: Target date: 02/24/2022  Patient will be independent in home exercise program to improve strength/mobility for better functional independence with ADLs. Baseline: Goal status: INITIAL    LONG TERM GOALS: Target date: 05/12/2022  Patient will increase FOTO score to equal to or greater than  68   to demonstrate statistically significant improvement in mobility and quality of life.  Baseline: 63 Goal status: INITIAL  2.  Patient (> 5 years old) will complete five times sit to stand test in < 15 seconds indicating an increased LE strength and improved balance. Baseline: 17.3 s Goal status: INITIAL  3.   Patient will increase Berg Balance score by > 6 points to demonstrate decreased fall risk during functional activities. Baseline: 45 Goal status: INITIAL  4.   Patient will reduce timed up and go to <11 seconds to reduce fall risk and demonstrate improved transfer/gait ability. Baseline: 11.87 Goal status: INITIAL  5.  Patient will increase BLE gross strength to 4+/5 as to improve functional strength for independent gait, increased standing tolerance and increased ADL ability. Baseline: see eval, R side weaker than left grossly  Goal status: INITIAL    ASSESSMENT:  CLINICAL IMPRESSION: Patient presents with excellent motivation despite her fatigue from this morning. Intermittent rest breaks required throughout session. Focus on stability due to patient reporting episodes of instability with church. Patient will continue to benefit from skilled physical therapy to improve stability, mobility, and quality of life.      OBJECTIVE IMPAIRMENTS decreased activity tolerance, decreased balance, and decreased strength.   ACTIVITY LIMITATIONS bending, squatting, and stairs  PARTICIPATION LIMITATIONS: cleaning, laundry, community activity, and activities going to events for her grandchildren   PERSONAL FACTORS Age and 1-2 comorbidities: HLD, HTN   are also affecting patient's functional outcome.   REHAB POTENTIAL: Excellent  CLINICAL DECISION MAKING: Stable/uncomplicated  EVALUATION COMPLEXITY: Low  PLAN: PT FREQUENCY: 2x/week  PT DURATION: 12 weeks  PLANNED INTERVENTIONS: Therapeutic exercises, Therapeutic activity, Neuromuscular re-education, Balance training, Gait training, Patient/Family education, Joint mobilization, and Stair training  PLAN FOR NEXT SESSION: continue POC     Janna Arch, PT 02/22/2022, 2:27 PM

## 2022-02-22 ENCOUNTER — Ambulatory Visit: Payer: Medicare HMO

## 2022-02-22 DIAGNOSIS — I10 Essential (primary) hypertension: Secondary | ICD-10-CM

## 2022-02-22 DIAGNOSIS — R262 Difficulty in walking, not elsewhere classified: Secondary | ICD-10-CM | POA: Diagnosis not present

## 2022-02-22 DIAGNOSIS — R278 Other lack of coordination: Secondary | ICD-10-CM | POA: Diagnosis not present

## 2022-02-22 DIAGNOSIS — E785 Hyperlipidemia, unspecified: Secondary | ICD-10-CM | POA: Diagnosis not present

## 2022-02-22 DIAGNOSIS — R269 Unspecified abnormalities of gait and mobility: Secondary | ICD-10-CM | POA: Diagnosis not present

## 2022-02-22 DIAGNOSIS — M6281 Muscle weakness (generalized): Secondary | ICD-10-CM

## 2022-02-22 DIAGNOSIS — R41841 Cognitive communication deficit: Secondary | ICD-10-CM | POA: Diagnosis not present

## 2022-02-23 ENCOUNTER — Encounter: Payer: Medicare HMO | Admitting: Occupational Therapy

## 2022-02-23 ENCOUNTER — Ambulatory Visit: Payer: Medicare HMO

## 2022-02-24 ENCOUNTER — Ambulatory Visit: Payer: Medicare HMO | Attending: Neurology | Admitting: Physical Therapy

## 2022-02-24 ENCOUNTER — Ambulatory Visit: Payer: Medicare HMO | Admitting: Speech Pathology

## 2022-02-24 DIAGNOSIS — R41841 Cognitive communication deficit: Secondary | ICD-10-CM | POA: Diagnosis not present

## 2022-02-24 DIAGNOSIS — R269 Unspecified abnormalities of gait and mobility: Secondary | ICD-10-CM | POA: Diagnosis not present

## 2022-02-24 DIAGNOSIS — R262 Difficulty in walking, not elsewhere classified: Secondary | ICD-10-CM | POA: Diagnosis not present

## 2022-02-24 DIAGNOSIS — M6281 Muscle weakness (generalized): Secondary | ICD-10-CM | POA: Insufficient documentation

## 2022-02-24 DIAGNOSIS — R278 Other lack of coordination: Secondary | ICD-10-CM | POA: Diagnosis not present

## 2022-02-24 NOTE — Telephone Encounter (Signed)
Patient called back and said that shes still having slight headaches but not as bad as when shes taking the medicine. Shes having tem mainly in the mornings when she wakes up.

## 2022-02-24 NOTE — Telephone Encounter (Signed)
Called and spoke w/ pt made an appt 02/25/22 @ 9

## 2022-02-24 NOTE — Telephone Encounter (Signed)
Please stay off of it for now.  Schedule f/u in office with me for headache this week or next week please

## 2022-02-24 NOTE — Therapy (Incomplete)
OUTPATIENT SPEECH LANGUAGE PATHOLOGY TREATMENT   Patient Name: Cheryl Joseph MRN: [id number] DOB:27-Nov-1940, 81 y.o., female Today's Date: 02/24/2022  PCP: Cheryl Pardon, MD REFERRING PROVIDER: Jennings Books, MD   End of Session - 02/24/22 1431     Visit Number 2    Number of Visits 17    Date for SLP Re-Evaluation 04/16/22    SLP Start Time 13    SLP Stop Time  1500    SLP Time Calculation (min) 60 min    Activity Tolerance Patient tolerated treatment well             Past Medical History:  Diagnosis Date   Anginal pain ([place])    Arthritis    RA hands(seronegative)   Asthma    Back pain    Cancer ([place])    skin   Cataract    HPV in female 1996   HPV with colposcopy (all neg paps since)   Hyperlipidemia    Hypertension    Incarcerated ventral hernia    Obesity    Osteopenia    Pre-diabetes    Shortness of breath dyspnea    Shoulder pain    Past Surgical History:  Procedure Laterality Date   CATARACT EXTRACTION, BILATERAL Bilateral    COLONOSCOPY  06/01/2004   COLONOSCOPY WITH PROPOFOL N/A 05/01/2015   Procedure: COLONOSCOPY WITH PROPOFOL;  Surgeon: Cheryl Silvas, MD;  Location: [facility];  Service: Endoscopy;  Laterality: N/A;   CORRECTION HAMMER TOE Right    2nd toe   JOINT REPLACEMENT Right    right knee replacement January 2021   SQUAMOUS CELL CARCINOMA EXCISION Left 12/2017   left arm   TONSILLECTOMY     VAGINAL HYSTERECTOMY  2015   with pelvic organ prolapse surgery    XI ROBOTIC ASSISTED VENTRAL HERNIA N/A 06/10/2021   Procedure: XI ROBOTIC ASSISTED VENTRAL HERNIA;  Surgeon: Cheryl Pun, MD;  Location: [facility];  Service: General;  Laterality: N/A;   Patient Active Problem List   Diagnosis Date Noted   Acute bilateral low back pain without sciatica 01/29/2022   Microscopic hematuria 01/29/2022   Abnormal urine 01/29/2022   History of stroke 03/00/9233   Umbilical hernia 00/76/2263   Frequent stools 01/04/2020    Pre-operative general physical examination 07/06/2019   Lower abdominal pain 08/16/2018   Stress reaction 09/05/2017   Estrogen deficiency 05/25/2016   Routine general medical examination at a health care facility 11/23/2015   Hammertoe 10/28/2015   Frequent loose stools 02/14/2015   Diverticulosis of colon without hemorrhage 02/14/2015   Colon cancer screening 08/19/2014   Abdominal pain, left lower quadrant 03/18/2014   Essential hypertension 02/25/2014   Encounter for Medicare annual wellness exam 07/16/2013   Prediabetes 05/14/2009   Rheumatoid arthritis with positive rheumatoid factor ([place]) 05/13/2008   Osteopenia 03/07/2007   Hyperlipidemia 01/03/2007   Obesity (BMI 30-39.9) 01/03/2007   ALLERGIC RHINITIS, SEASONAL 01/03/2007   ASTHMA 01/03/2007   OVERACTIVE BLADDER 01/03/2007    ONSET DATE: 12/31/2021   REFERRING DIAG: CVA  THERAPY DIAG:  Cognitive communication deficit  Rationale for Evaluation and Treatment Rehabilitation  SUBJECTIVE:   SUBJECTIVE STATEMENT:  Pt reports burning 2 batches of cookies. Pt accompanied by: self  PERTINENT HISTORY: Patient is an 81 y.o. female with past medical history including HTN, HLD, and asthma who was admitted 12/31/21 with right leg weakness and right arm numbness and was found to have CVA. OT requested SLP evaluation due to pt reports of wordfinding difficulties.  DIAGNOSTIC FINDINGS: MRI brain 12/31/2021: Small acute perforator infarct in the left basal ganglia.  PAIN:  Are you having pain? No   PATIENT GOALS Keep doing my teaching (bible study), not get so frustrated with words  OBJECTIVE:   TODAY'S TREATMENT: Patient reports feeling some anxiety about changes since stroke. SLP provided reassurance and worked with pt to generate strategies such as reducing multitasking, using timers to improve her focus on cooking tasks. Initiated training in semantic feature analysis to increase self-cuing for wordfinding and ability to  repair communication breakdowns. Pt generated 5-6 descriptors for less-common nouns with initial mod cues, fading to occasional min A. Pt reports being able to "see" what letter a word starts with at times when she is experiencing anomia. Education provided on communication modalities including using writing to facilitate word recall.   PATIENT EDUCATION: Education details: Activities to support cognitive-linguistic function at home, likely brief course of therapy to train pt in wordfinding strategies and compensations Person educated: Patient Education method: Verbal cues Education comprehension: verbalized understanding   GOALS: Goals reviewed with patient? Yes  SHORT TERM GOALS: Target date: 10 sessions  Patient will complete high-level wordfinding tasks >90% acc with rare min cues.  Baseline: Goal status: INITIAL  2.  Patient will use semantic feature analysis to generate 4+ descriptions for targets >90% acc.  Baseline:  Goal status: INITIAL  3.  Patient will use anomia compensations in 10 minutes mod complex conversation with rare min cues.  Baseline:  Goal status: INITIAL   LONG TERM GOALS: Target date: 04/16/2022  Patient will use anomia compensations in complex conversation of 15-20 minutes with modified independence. Baseline:  Goal status: INITIAL  2.  Patient will report successful use of strategies (anomia compensations, pre-planning) to improve communication in community activities over 3 consecutive visits. Baseline:  Goal status: INITIAL  3.  Pt will report improved communication effectiveness as measured by the Communication Effectiveness Survey.  Baseline:  Goal status: INITIAL    ASSESSMENT:  CLINICAL IMPRESSION: Cheryl Joseph presents with mild verbal expression impairment characterized by changes in wordfinding and thought organization abilities since her CVA. Patient reports some attentional difficulties (complex alternating/divergent). Pt receptive  to using strategies and demonstrating increasing ability for self-cuing as session progressed. Given her previously high level of function and enjoyment of language tasks (reads daily and studies Joseph with her husband, leads a Bible study), and reports of functional impairments leading to frustration during communication exchanges, she would benefit from brief course of ST with focus on training and generalization of strategies.   OBJECTIVE IMPAIRMENTS include expressive language. These impairments are limiting patient from effectively communicating at home and in community. Factors affecting potential to achieve goals and functional outcome are ability to learn/carryover information and previous level of function. Patient will benefit from skilled SLP services to address above impairments and improve overall function.  REHAB POTENTIAL: Excellent  PLAN: SLP FREQUENCY: 1-2x/week  SLP DURATION: 8 weeks  PLANNED INTERVENTIONS: Language facilitation, Environmental controls, Cueing hierachy, Cognitive reorganization, Internal/external aids, Functional tasks, Multimodal communication approach, SLP instruction and feedback, Compensatory strategies, and Patient/family education      Deneise Lever, MS, Actor [phone]

## 2022-02-24 NOTE — Telephone Encounter (Signed)
Pt has stopped medication for 5 days and call back with updated

## 2022-02-24 NOTE — Therapy (Signed)
OUTPATIENT PHYSICAL THERAPY TREATMENT   Patient Name: Cheryl Joseph MRN: [id number] DOB:[birth year], 81 y.o., female Today's Date: 02/24/2022   PCP:   Abner Greenspan, MD   REFERRING PROVIDER: Vladimir Crofts, MD   PT End of Session - 02/24/22 1449     Visit Number 8    Number of Visits 24    Date for PT Re-Evaluation 04/21/22    PT Start Time 0310    PT Stop Time 0351    PT Time Calculation (min) 41 min    Equipment Utilized During Treatment Gait belt    Activity Tolerance Patient tolerated treatment well    Behavior During Therapy WFL for tasks assessed/performed                   Past Medical History:  Diagnosis Date   Anginal pain ([place])    Arthritis    RA hands(seronegative)   Asthma    Back pain    Cancer ([place])    skin   Cataract    HPV in female 1996   HPV with colposcopy (all neg paps since)   Hyperlipidemia    Hypertension    Incarcerated ventral hernia    Obesity    Osteopenia    Pre-diabetes    Shortness of breath dyspnea    Shoulder pain    Past Surgical History:  Procedure Laterality Date   CATARACT EXTRACTION, BILATERAL Bilateral    COLONOSCOPY  06/01/2004   COLONOSCOPY WITH PROPOFOL N/A 05/01/2015   Procedure: COLONOSCOPY WITH PROPOFOL;  Surgeon: Manya Silvas, MD;  Location: [facility];  Service: Endoscopy;  Laterality: N/A;   CORRECTION HAMMER TOE Right    2nd toe   JOINT REPLACEMENT Right    right knee replacement January 2021   SQUAMOUS CELL CARCINOMA EXCISION Left 12/2017   left arm   TONSILLECTOMY     VAGINAL HYSTERECTOMY  2015   with pelvic organ prolapse surgery    XI ROBOTIC ASSISTED VENTRAL HERNIA N/A 06/10/2021   Procedure: XI ROBOTIC ASSISTED VENTRAL HERNIA;  Surgeon: Herbert Pun, MD;  Location: [facility];  Service: General;  Laterality: N/A;   Patient Active Problem List   Diagnosis Date Noted   Acute bilateral low back pain without sciatica 01/29/2022   Microscopic hematuria 01/29/2022    Abnormal urine 01/29/2022   History of stroke 67/59/1638   Umbilical hernia 46/65/9935   Frequent stools 01/04/2020   Pre-operative general physical examination 07/06/2019   Lower abdominal pain 08/16/2018   Stress reaction 09/05/2017   Estrogen deficiency 05/25/2016   Routine general medical examination at a health care facility 11/23/2015   Hammertoe 10/28/2015   Frequent loose stools 02/14/2015   Diverticulosis of colon without hemorrhage 02/14/2015   Colon cancer screening 08/19/2014   Abdominal pain, left lower quadrant 03/18/2014   Essential hypertension 02/25/2014   Encounter for Medicare annual wellness exam 07/16/2013   Prediabetes 05/14/2009   Rheumatoid arthritis with positive rheumatoid factor ([place]) 05/13/2008   Osteopenia 03/07/2007   Hyperlipidemia 01/03/2007   Obesity (BMI 30-39.9) 01/03/2007   ALLERGIC RHINITIS, SEASONAL 01/03/2007   ASTHMA 01/03/2007   OVERACTIVE BLADDER 01/03/2007    ONSET DATE: 12/31/21  REFERRING DIAG: T01.77 (ICD-10-CM) - Personal history of transient ischemic attack (TIA), and cerebral infarction without residual deficits  THERAPY DIAG:  Muscle weakness (generalized)  Abnormality of gait and mobility  Difficulty in walking, not elsewhere classified  Rationale for Evaluation and Treatment Rehabilitation  SUBJECTIVE:  SUBJECTIVE STATEMENT: Pt reports she has been busy with cooking for the "national night out" which is a big neighborhood party and she is preparing to cook for a big brunch next week. Pt reports first speech appointment went well and she has her assigned homework.    PERTINENT HISTORY: Pt reports he biggest concern is her balance at the moment. She reports she is not dizzy necessarily but she is off. Pt reports no falls in the last 6  months and she does not utilize an AD at this time. Pt has had PT in the past but balance was not the main focus, pt also has had PT for her R TKA.  With her balance pt feels when she gets up to walk she feels like she needs to hold onto something. Pt has 20 grandchildren who range from 2 years to 24 years and is also expecting a new great grandchild soon. Pt also enjoys travelling with her husband and tries to exercise for 20 minutes per day with walking or other light activities.   PAIN:  Are you having pain? No  PRECAUTIONS: Fall  WEIGHT BEARING RESTRICTIONS No  FALLS: Has patient fallen in last 6 months? No  LIVING ENVIRONMENT: Lives with: lives with their family and lives with their spouse Lives in: House/apartment Stairs: Yes: External: 3 steps; on right going up Has following equipment at home: Single point cane and Walker - 2 wheeled  PLOF: Independent and Independent with basic ADLs  PATIENT GOALS: Improve strength, balance, and confidence with activities  OBJECTIVE:   LOWER EXTREMITY MMT:    MMT Right Eval Left Eval  Hip flexion 4 4+  Hip extension    Hip abduction 4+ 4+  Hip adduction 5 5  Hip internal rotation    Hip external rotation    Knee flexion 4 4+  Knee extension 4 4+  Ankle dorsiflexion 5 5  Ankle plantarflexion 5 5  Ankle inversion    Ankle eversion    (Blank rows = not tested)   TODAY'S TREATMENT:  02/24/22   therEx:  Nustep Lvl 3 seat position 8 RPM> 50 x4 minutes  Sit to stand 10x with weighted ball press-terminated at 8 due to L knee pain w/4.4#weighted ball  Leg press  10 reps x 40# - " hard to get started but not hard following first rep"  10 reps x 55 # - moderate challenge  10 reps x 25# L LE only - come crepitus noted in knee but no pain   Step up to 6 in step with L LE 2 x 10 with UE assist -education regarding proper stair navigation with LE weakness ( up with good, down with bad and demonstration)     Seated with # 4  ankle weights  -Seated marches with upright posture, step over hurdle and back, x 10 ea with increased difficulty on R > L  -Seated LAQ with 3 second holds, 10x each LE, 2 sets cueing for muscle activation and sequencing for neutral alignment    NMR:  Walking with ice cream cone in hand:  Airex side step up/down x 10 ea direction   -walk with cone in hand 40 ft normal gait -40 ft horizontal head turns -40 ft vertical head turns -ambulating backwards and forwards alternating 15 feet x 4 reps.   PATIENT EDUCATION: Education details: Pt educated throughout session about proper posture and technique with exercises. Improved exercise technique, movement at target joints, use of target muscles after min  to mod verbal, visual, tactile cues.  Person educated: Patient Education method: Explanation Education comprehension: verbalized understanding   HOME EXERCISE PROGRAM: Access Code: [email or web address] URL: [email or web address] Date: 02/04/2022 Prepared by: Rivka Barbara  Exercises - Sit to Stand  - 1 x daily - 7 x weekly - 2 sets - 8 reps - Standing March with Counter Support  - 1 x daily - 7 x weekly - 2 sets - 10 reps - Side Stepping with Counter Support  - 1 x daily - 7 x weekly - 2 sets - 10 reps - Standing in "bowling stance" with counter support   - 1 x daily - 7 x weekly - 2 reps - 30 seocnd hold    GOALS: Goals reviewed with patient? Yes  SHORT TERM GOALS: Target date: 02/24/2022  Patient will be independent in home exercise program to improve strength/mobility for better functional independence with ADLs. Baseline: Goal status: INITIAL    LONG TERM GOALS: Target date: 05/12/2022  Patient will increase FOTO score to equal to or greater than  68   to demonstrate statistically significant improvement in mobility and quality of life.  Baseline: 63 Goal status: INITIAL  2.  Patient (> 15 years old) will complete five times sit to stand test in < 15 seconds  indicating an increased LE strength and improved balance. Baseline: 17.3 s Goal status: INITIAL  3.  Patient will increase Berg Balance score by > 6 points to demonstrate decreased fall risk during functional activities. Baseline: 45 Goal status: INITIAL  4.   Patient will reduce timed up and go to <11 seconds to reduce fall risk and demonstrate improved transfer/gait ability. Baseline: 11.87 Goal status: INITIAL  5.  Patient will increase BLE gross strength to 4+/5 as to improve functional strength for independent gait, increased standing tolerance and increased ADL ability. Baseline: see eval, R side weaker than left grossly  Goal status: INITIAL    ASSESSMENT:  CLINICAL IMPRESSION: Continued with current plan of care as laid out in evaluation and recent prior sessions. Pt remains motivated to advance progress toward goals in order to maximize independence and safety at home. Pt requires high level assistance and cuing for completion of exercises in order to provide adequate level of stimulation and perturbation. Author allows pt as much opportunity as possible to perform independent righting strategies, only stepping in when pt is unable to prevent falling to floor. Pt progresses with stair navigation and leg press activities with good response this session. Pt still experiencing L> R LE weakness but is improving in difficulty of tasks. Pt continues to demonstrate progress toward goals AEB progression of some interventions this date either in volume or intensity.      OBJECTIVE IMPAIRMENTS decreased activity tolerance, decreased balance, and decreased strength.   ACTIVITY LIMITATIONS bending, squatting, and stairs  PARTICIPATION LIMITATIONS: cleaning, laundry, community activity, and activities going to events for her grandchildren   PERSONAL FACTORS Age and 1-2 comorbidities: HLD, HTN   are also affecting patient's functional outcome.   REHAB POTENTIAL: Excellent  CLINICAL  DECISION MAKING: Stable/uncomplicated  EVALUATION COMPLEXITY: Low  PLAN: PT FREQUENCY: 2x/week  PT DURATION: 12 weeks  PLANNED INTERVENTIONS: Therapeutic exercises, Therapeutic activity, Neuromuscular re-education, Balance training, Gait training, Patient/Family education, Joint mobilization, and Stair training  PLAN FOR NEXT SESSION: continue POC     Particia Lather, PT 02/24/2022, 4:20 PM

## 2022-02-25 ENCOUNTER — Encounter: Payer: Medicare HMO | Admitting: Occupational Therapy

## 2022-02-25 ENCOUNTER — Ambulatory Visit (INDEPENDENT_AMBULATORY_CARE_PROVIDER_SITE_OTHER): Payer: Medicare HMO | Admitting: Family Medicine

## 2022-02-25 ENCOUNTER — Encounter: Payer: Self-pay | Admitting: Family Medicine

## 2022-02-25 VITALS — BP 110/80 | HR 64 | Ht 62.0 in | Wt 201.2 lb

## 2022-02-25 DIAGNOSIS — E785 Hyperlipidemia, unspecified: Secondary | ICD-10-CM

## 2022-02-25 DIAGNOSIS — R519 Headache, unspecified: Secondary | ICD-10-CM

## 2022-02-25 DIAGNOSIS — Z8673 Personal history of transient ischemic attack (TIA), and cerebral infarction without residual deficits: Secondary | ICD-10-CM

## 2022-02-25 DIAGNOSIS — I10 Essential (primary) hypertension: Secondary | ICD-10-CM | POA: Diagnosis not present

## 2022-02-25 DIAGNOSIS — G8929 Other chronic pain: Secondary | ICD-10-CM

## 2022-02-25 DIAGNOSIS — R0683 Snoring: Secondary | ICD-10-CM

## 2022-02-25 DIAGNOSIS — R7303 Prediabetes: Secondary | ICD-10-CM | POA: Diagnosis not present

## 2022-02-25 LAB — HEMOGLOBIN A1C: Hgb A1c MFr Bld: 6.4 % (ref 4.6–6.5)

## 2022-02-25 LAB — LDL CHOLESTEROL, DIRECT: Direct LDL: 135 mg/dL

## 2022-02-25 LAB — LIPID PANEL
Cholesterol: 216 mg/dL — ABNORMAL HIGH (ref 0–200)
HDL: 43 mg/dL (ref >39.00)
NonHDL: 172.64
Total CHOL/HDL Ratio: 5
Triglycerides: 271 mg/dL — ABNORMAL HIGH (ref 0.0–149.0)
VLDL: 54.2 mg/dL — ABNORMAL HIGH (ref 0.0–40.0)

## 2022-02-25 NOTE — Progress Notes (Signed)
Subjective:    Patient ID: Cheryl Joseph, female    DOB: 1941/03/09, 81 y.o.   MRN: [id number]  HPI 81 yo pt with h/o CVA presents with c/o headaches  Wt Readings from Last 3 Encounters:  02/25/22 201 lb 3.2 oz (91.3 kg)  01/29/22 200 lb (90.7 kg)  01/06/22 201 lb 8 oz (91.4 kg)   36.80 kg/m  Holding statin - caused much worse headache   L ear hurts  No congestion     Right now it is over top and back of her head  Wakes up with it  Then sometimes gets better after bkfast  Comes back in the afternoon  Triggered by temp change - like getting in a hot car   Not throbbing  Not unilateral  No nausea  Not sensitive to light   Seldom had headaches before the stroke    Doing PT and speech tx for her stroke Wears her out     Takes tylenol as needed/trying not to overuse  Does not use caffeine  Trying to stay hydrated (feels thirsty today)    Very active Loves to do things in her community  When she gets tired she makes errors when cooking  Active in church   Sleep is good  No problems with it  Snores at times    H/o cva Plavix Statin -originally on crestor 20 mg to get LDL under 70 Sees Dr Manuella Ghazi   HTN bp is stable today  No cp or palpitations or headaches or edema  No side effects to medicines  BP Readings from Last 3 Encounters:  02/25/22 110/80  01/29/22 120/70  01/06/22 (!) 142/78     Pulse Readings from Last 3 Encounters:  02/25/22 64  01/29/22 65  01/06/22 61     Lab Results  Component Value Date   CHOL 165 01/01/2022   HDL 46 01/01/2022   LDLCALC 88 01/01/2022   LDLDIRECT 97.0 07/06/2019   TRIG 156 (H) 01/01/2022   CHOLHDL 3.6 01/01/2022   Now has not had cholesterol medicine for 5 days  After cutting the dose   Trying to loose wt Lost 7 lb then leveled out  Gets too tired to exercise some days but tries to walk 30 min daily  PT gives her good exercise   Lab Results  Component Value Date   HGBA1C 6.4 10/21/2021     Patient Active Problem List   Diagnosis Date Noted   Chronic headache 02/25/2022   Snoring 02/25/2022   Acute bilateral low back pain without sciatica 01/29/2022   Microscopic hematuria 01/29/2022   Abnormal urine 01/29/2022   History of stroke 53/61/4431   Umbilical hernia 54/00/8676   Frequent stools 01/04/2020   Pre-operative general physical examination 07/06/2019   Lower abdominal pain 08/16/2018   Stress reaction 09/05/2017   Estrogen deficiency 05/25/2016   Routine general medical examination at a health care facility 11/23/2015   Hammertoe 10/28/2015   Frequent loose stools 02/14/2015   Diverticulosis of colon without hemorrhage 02/14/2015   Colon cancer screening 08/19/2014   Abdominal pain, left lower quadrant 03/18/2014   Essential hypertension 02/25/2014   Encounter for Medicare annual wellness exam 07/16/2013   Prediabetes 05/14/2009   Rheumatoid arthritis with positive rheumatoid factor ([place]) 05/13/2008   Osteopenia 03/07/2007   Hyperlipidemia 01/03/2007   Obesity (BMI 30-39.9) 01/03/2007   ALLERGIC RHINITIS, SEASONAL 01/03/2007   ASTHMA 01/03/2007   OVERACTIVE BLADDER 01/03/2007   Past Medical History:  Diagnosis Date  Anginal pain (HCC)    Arthritis    RA hands(seronegative)   Asthma    Back pain    Cancer (HCC)    skin   Cataract    HPV in female 1996   HPV with colposcopy (all neg paps since)   Hyperlipidemia    Hypertension    Incarcerated ventral hernia    Obesity    Osteopenia    Pre-diabetes    Shortness of breath dyspnea    Shoulder pain    Past Surgical History:  Procedure Laterality Date   CATARACT EXTRACTION, BILATERAL Bilateral    COLONOSCOPY  06/01/2004   COLONOSCOPY WITH PROPOFOL N/A 05/01/2015   Procedure: COLONOSCOPY WITH PROPOFOL;  Surgeon: Manya Silvas, MD;  Location: [facility];  Service: Endoscopy;  Laterality: N/A;   CORRECTION HAMMER TOE Right    2nd toe   JOINT REPLACEMENT Right    right knee replacement  January 2021   SQUAMOUS CELL CARCINOMA EXCISION Left 12/2017   left arm   TONSILLECTOMY     VAGINAL HYSTERECTOMY  2015   with pelvic organ prolapse surgery    XI ROBOTIC ASSISTED VENTRAL HERNIA N/A 06/10/2021   Procedure: XI ROBOTIC ASSISTED VENTRAL HERNIA;  Surgeon: Herbert Pun, MD;  Location: [facility];  Service: General;  Laterality: N/A;   Social History   Tobacco Use   Smoking status: Never   Smokeless tobacco: Never  Vaping Use   Vaping Use: Never used  Substance Use Topics   Alcohol use: No    Alcohol/week: 0.0 standard drinks of alcohol   Drug use: No   Family History  Problem Relation Age of Onset   Diabetes Mother    Hyperlipidemia Mother    Heart disease Mother    Hypertension Mother    Obesity Mother    Kyphosis Mother    Heart disease Father    Hypertension Father    Cancer Father        lung Cancer smoker   Diabetes Daughter    Kyphosis Sister    Breast cancer Neg Hx    Allergies  Allergen Reactions   Tolterodine Tartrate Other (See Comments)    Unknown (back ache)   Vesicare [Solifenacin Succinate]     Eye problems    Current Outpatient Medications on File Prior to Visit  Medication Sig Dispense Refill   acetaminophen (TYLENOL) 500 MG tablet Take 1,000 mg by mouth every 6 (six) hours as needed for mild pain.     albuterol (VENTOLIN HFA) 108 (90 Base) MCG/ACT inhaler INHALE TWO PUFFS BY MOUTH EVERY 4 HOURS AS NEEDED FOR WHEEZING AND 2 PUFFS BEFORE EXERCISE OR EXPOSURE TO COLD AIR 18 each 2   amLODipine (NORVASC) 5 MG tablet Hold until followup with PCP. 90 tablet 3   aspirin EC 81 MG tablet Take 1 tablet (81 mg total) by mouth daily. Swallow whole.     b complex vitamins tablet Take 1 tablet by mouth every Monday, Wednesday, and Friday.     calcium carbonate (OS-CAL - DOSED IN MG OF ELEMENTAL CALCIUM) 1250 (500 Ca) MG tablet Take 2 tablets by mouth daily with breakfast.     Cholecalciferol (VITAMIN D3 PO) Take 2 tablets by mouth daily.      Hyaluronic Acid-Vitamin C (HYALURONIC ACID PO) Take 1 tablet by mouth every Monday, Wednesday, and Friday.     montelukast (SINGULAIR) 10 MG tablet Take 1 tablet (10 mg total) by mouth at bedtime. 90 tablet 3   Multiple Vitamin (  MULTIVITAMIN) capsule Take 1 capsule by mouth daily. Reported on 12/18/2015     Omega-3 Fatty Acids (FISH OIL PO) Take 1 capsule by mouth daily. Reported on 12/18/2015     OVER THE COUNTER MEDICATION Take 2 capsules by mouth daily. BONE UP     oxybutynin (OXYTROL) 3.9 MG/24HR Place 1 patch onto the skin once a week. Sunday.  Home med.     Probiotic Product (PROBIOTIC DAILY PO) Take 1 tablet by mouth daily.     Pumpkin Seed-Soy Germ (AZO BLADDER CONTROL/GO-LESS PO) Take 1 capsule by mouth at bedtime.     TURMERIC PO Take 720 mg by mouth every Monday, Wednesday, and Friday.     Ubiquinol 100 MG CAPS Take 1 capsule by mouth every Monday, Wednesday, and Friday.     atorvastatin (LIPITOR) 80 MG tablet Take 1 tablet (80 mg total) by mouth daily. (Patient not taking: Reported on 02/25/2022) 30 tablet 2   psyllium (METAMUCIL SMOOTH TEXTURE) 28 % packet Take 1 packet by mouth daily. (Patient not taking: Reported on 02/25/2022)     rosuvastatin (CRESTOR) 20 MG tablet Take 1 tablet (20 mg total) by mouth daily. (Patient not taking: Reported on 02/25/2022) 90 tablet 3   No current facility-administered medications on file prior to visit.      Review of Systems  Constitutional:  Positive for fatigue. Negative for activity change, appetite change, fever and unexpected weight change.  HENT:  Negative for congestion, ear pain, rhinorrhea, sinus pressure and sore throat.   Eyes:  Negative for pain, redness and visual disturbance.  Respiratory:  Negative for cough, shortness of breath and wheezing.   Cardiovascular:  Negative for chest pain and palpitations.  Gastrointestinal:  Negative for abdominal pain, blood in stool, constipation and diarrhea.  Endocrine: Negative for polydipsia and  polyuria.  Genitourinary:  Negative for dysuria, frequency and urgency.  Musculoskeletal:  Negative for arthralgias, back pain and myalgias.  Skin:  Negative for pallor and rash.  Allergic/Immunologic: Negative for environmental allergies.  Neurological:  Positive for weakness and headaches. Negative for dizziness, tremors, syncope, facial asymmetry and numbness.  Hematological:  Negative for adenopathy. Does not bruise/bleed easily.  Psychiatric/Behavioral:  Negative for decreased concentration and dysphoric mood. The patient is not nervous/anxious.        Objective:   Physical Exam Constitutional:      General: She is not in acute distress.    Appearance: Normal appearance. She is well-developed. She is obese. She is not ill-appearing or diaphoretic.  HENT:     Head: Normocephalic and atraumatic.     Comments: No sinus or temporal tenderness     Right Ear: Tympanic membrane, ear canal and external ear normal.     Left Ear: Tympanic membrane, ear canal and external ear normal.     Nose: Nose normal.     Mouth/Throat:     Pharynx: No oropharyngeal exudate.  Eyes:     General: No scleral icterus.       Right eye: No discharge.        Left eye: No discharge.     Conjunctiva/sclera: Conjunctivae normal.     Pupils: Pupils are equal, round, and reactive to light.     Comments: No nystagmus  Neck:     Thyroid: No thyromegaly.     Vascular: No carotid bruit or JVD.     Trachea: No tracheal deviation.  Cardiovascular:     Rate and Rhythm: Normal rate and regular rhythm.  Heart sounds: Normal heart sounds. No murmur heard. Pulmonary:     Effort: Pulmonary effort is normal. No respiratory distress.     Breath sounds: Normal breath sounds. No wheezing or rales.  Abdominal:     General: Bowel sounds are normal. There is no distension.     Palpations: Abdomen is soft. There is no mass.     Tenderness: There is no abdominal tenderness.  Musculoskeletal:        General: No  tenderness.     Cervical back: Full passive range of motion without pain, normal range of motion and neck supple.     Right lower leg: No edema.     Left lower leg: No edema.  Lymphadenopathy:     Cervical: No cervical adenopathy.  Skin:    General: Skin is warm and dry.     Coloration: Skin is not jaundiced or pale.     Findings: No bruising, erythema or rash.  Neurological:     Mental Status: She is alert and oriented to person, place, and time.     Cranial Nerves: No cranial nerve deficit.     Sensory: No sensory deficit.     Motor: No tremor, atrophy or abnormal muscle tone.     Coordination: Coordination normal.     Gait: Gait normal.     Deep Tendon Reflexes: Reflexes are normal and symmetric. Reflexes normal.     Comments: No focal cerebellar signs   Psychiatric:        Mood and Affect: Mood normal.        Behavior: Behavior normal.        Thought Content: Thought content normal.           Assessment & Plan:   Problem List Items Addressed This Visit       Cardiovascular and Mediastinum   Essential hypertension    bp in fair control at this time  BP Readings from Last 1 Encounters:  02/25/22 110/80  No changes needed Most recent labs reviewed  Disc lifstyle change with low sodium diet and exercise  Controlled by amlodipine 5 mg daily in setting of past CVA        Other   Chronic headache - Primary    Daily headache worse in am  Much worse with statin-improved off of it (both lipitor and crestor) bp in good control No h/o past migraine  May be related to past cva  (is in PT and Speech tx now) Does snore- ? Poss sleep apnea  Will refer for early f/u with neuro to discuss headache Also ref for sleep clinic (pulm) eval for possible sleep apnea Discussed lifestyle habits for headache prevention and handout given (see avs)  Reassuring exam       Relevant Orders   Ambulatory referral to Neurology   Ambulatory referral to Pulmonology   History of stroke     Doing well  Intolerant of statin unfortunately (checking lipids today/consider zetia?) bp controlled  Under neuro care Headache is problematic-unsure if related  Will plan for neuro f/u      Relevant Orders   Ambulatory referral to Neurology   Ambulatory referral to Pulmonology   Hyperlipidemia    Lipid panel today  Goal of LDL under 70 for recent stroke Intol of 2 statins (headache) May need to consider zetia       Relevant Orders   Lipid panel (Completed)   Prediabetes    a1c today  disc imp of low glycemic  diet and wt loss to prevent DM2       Relevant Orders   Hemoglobin A1c (Completed)   Snoring    With daily headache (worse in am) , obesity and recent cva Will ref to pulm for eval of possible osa      Relevant Orders   Ambulatory referral to Pulmonology

## 2022-02-25 NOTE — Assessment & Plan Note (Signed)
Lipid panel today  Goal of LDL under 70 for recent stroke Intol of 2 statins (headache) May need to consider zetia

## 2022-02-25 NOTE — Assessment & Plan Note (Signed)
Doing well  Intolerant of statin unfortunately (checking lipids today/consider zetia?) bp controlled  Under neuro care Headache is problematic-unsure if related  Will plan for neuro f/u

## 2022-02-25 NOTE — Assessment & Plan Note (Signed)
With daily headache (worse in am) , obesity and recent cva Will ref to pulm for eval of possible osa

## 2022-02-25 NOTE — Assessment & Plan Note (Signed)
bp in fair control at this time  BP Readings from Last 1 Encounters:  02/25/22 110/80   No changes needed Most recent labs reviewed  Disc lifstyle change with low sodium diet and exercise  Controlled by amlodipine 5 mg daily in setting of past CVA

## 2022-02-25 NOTE — Assessment & Plan Note (Signed)
a1c today  disc imp of low glycemic diet and wt loss to prevent DM2

## 2022-02-25 NOTE — Patient Instructions (Addendum)
For cholesterol Avoid red meat/ fried foods/ egg yolks/ fatty breakfast meats/ butter, cheese and high fat dairy/ and shellfish    Take care of yourself   Drink lots of fluids  Avoid excessive caffeine unless you have a headache   Lab today for cholesterol  We may want to try a different medicine   I want to get you back to neurology  Also a referral to pulmonary to discuss possible sleep apnea   If you don't get a call about these referrals in 1-2 weeks

## 2022-02-25 NOTE — Assessment & Plan Note (Signed)
Daily headache worse in am  Much worse with statin-improved off of it (both lipitor and crestor) bp in good control No h/o past migraine  May be related to past cva  (is in PT and Speech tx now) Does snore- ? Poss sleep apnea  Will refer for early f/u with neuro to discuss headache Also ref for sleep clinic (pulm) eval for possible sleep apnea Discussed lifestyle habits for headache prevention and handout given (see avs)  Reassuring exam

## 2022-02-26 ENCOUNTER — Other Ambulatory Visit: Payer: Self-pay

## 2022-02-26 ENCOUNTER — Telehealth: Payer: Self-pay | Admitting: Family Medicine

## 2022-02-26 MED ORDER — EZETIMIBE 10 MG PO TABS: 10.0000 mg | ORAL_TABLET | Freq: Every day | ORAL | 1 refills | Status: DC

## 2022-02-26 NOTE — Telephone Encounter (Signed)
Called and spoke w/ pt sent in  zetia to the pharmacy for patient.

## 2022-02-26 NOTE — Telephone Encounter (Signed)
Patient called and said she thought Dr Glori Bickers was going to send in a new cholesterol medication for her that she thinks starts with a Z. She said she and Dr Glori Bickers discussed but the pharmacy doesn't have anything. Call back is [phone]

## 2022-03-02 ENCOUNTER — Ambulatory Visit: Payer: Medicare HMO

## 2022-03-02 ENCOUNTER — Encounter: Payer: Medicare HMO | Admitting: Occupational Therapy

## 2022-03-02 DIAGNOSIS — M6281 Muscle weakness (generalized): Secondary | ICD-10-CM | POA: Diagnosis not present

## 2022-03-02 DIAGNOSIS — R269 Unspecified abnormalities of gait and mobility: Secondary | ICD-10-CM | POA: Diagnosis not present

## 2022-03-02 DIAGNOSIS — R278 Other lack of coordination: Secondary | ICD-10-CM | POA: Diagnosis not present

## 2022-03-02 DIAGNOSIS — R262 Difficulty in walking, not elsewhere classified: Secondary | ICD-10-CM

## 2022-03-02 DIAGNOSIS — R41841 Cognitive communication deficit: Secondary | ICD-10-CM | POA: Diagnosis not present

## 2022-03-02 NOTE — Therapy (Signed)
OUTPATIENT PHYSICAL THERAPY TREATMENT   Patient Name: Cheryl Joseph MRN: [id number] DOB:[birth year], 81 y.o., female Today's Date: 03/02/2022   PCP:   Abner Greenspan, MD   REFERRING PROVIDER: Vladimir Crofts, MD   PT End of Session - 03/02/22 1651     Visit Number 9    Number of Visits 24    Date for PT Re-Evaluation 04/21/22    PT Start Time 1649    PT Stop Time 1730    PT Time Calculation (min) 41 min    Equipment Utilized During Treatment Gait belt    Activity Tolerance Patient tolerated treatment well    Behavior During Therapy WFL for tasks assessed/performed                    Past Medical History:  Diagnosis Date   Anginal pain ([place])    Arthritis    RA hands(seronegative)   Asthma    Back pain    Cancer ([place])    skin   Cataract    HPV in female 1996   HPV with colposcopy (all neg paps since)   Hyperlipidemia    Hypertension    Incarcerated ventral hernia    Obesity    Osteopenia    Pre-diabetes    Shortness of breath dyspnea    Shoulder pain    Past Surgical History:  Procedure Laterality Date   CATARACT EXTRACTION, BILATERAL Bilateral    COLONOSCOPY  06/01/2004   COLONOSCOPY WITH PROPOFOL N/A 05/01/2015   Procedure: COLONOSCOPY WITH PROPOFOL;  Surgeon: Manya Silvas, MD;  Location: [facility];  Service: Endoscopy;  Laterality: N/A;   CORRECTION HAMMER TOE Right    2nd toe   JOINT REPLACEMENT Right    right knee replacement January 2021   SQUAMOUS CELL CARCINOMA EXCISION Left 12/2017   left arm   TONSILLECTOMY     VAGINAL HYSTERECTOMY  2015   with pelvic organ prolapse surgery    XI ROBOTIC ASSISTED VENTRAL HERNIA N/A 06/10/2021   Procedure: XI ROBOTIC ASSISTED VENTRAL HERNIA;  Surgeon: Herbert Pun, MD;  Location: [facility];  Service: General;  Laterality: N/A;   Patient Active Problem List   Diagnosis Date Noted   Chronic headache 02/25/2022   Snoring 02/25/2022   Acute bilateral low back pain without sciatica  01/29/2022   Microscopic hematuria 01/29/2022   Abnormal urine 01/29/2022   History of stroke 54/62/7035   Umbilical hernia 00/93/8182   Frequent stools 01/04/2020   Pre-operative general physical examination 07/06/2019   Lower abdominal pain 08/16/2018   Stress reaction 09/05/2017   Estrogen deficiency 05/25/2016   Routine general medical examination at a health care facility 11/23/2015   Hammertoe 10/28/2015   Frequent loose stools 02/14/2015   Diverticulosis of colon without hemorrhage 02/14/2015   Colon cancer screening 08/19/2014   Abdominal pain, left lower quadrant 03/18/2014   Essential hypertension 02/25/2014   Encounter for Medicare annual wellness exam 07/16/2013   Prediabetes 05/14/2009   Rheumatoid arthritis with positive rheumatoid factor ([place]) 05/13/2008   Osteopenia 03/07/2007   Hyperlipidemia 01/03/2007   Obesity (BMI 30-39.9) 01/03/2007   ALLERGIC RHINITIS, SEASONAL 01/03/2007   ASTHMA 01/03/2007   OVERACTIVE BLADDER 01/03/2007    ONSET DATE: 12/31/21  REFERRING DIAG: X93.71 (ICD-10-CM) - Personal history of transient ischemic attack (TIA), and cerebral infarction without residual deficits  THERAPY DIAG:  Muscle weakness (generalized)  Abnormality of gait and mobility  Difficulty in walking, not elsewhere classified  Rationale for Evaluation and  Treatment Rehabilitation  SUBJECTIVE:                                                                                                                                                                                              SUBJECTIVE STATEMENT: Patient has seen physician and has referral to pulmonology due to possible sleep apnea. Patient has been very busy and moving.    PERTINENT HISTORY: Pt reports he biggest concern is her balance at the moment. She reports she is not dizzy necessarily but she is off. Pt reports no falls in the last 6 months and she does not utilize an AD at this time. Pt has had PT in  the past but balance was not the main focus, pt also has had PT for her R TKA.  With her balance pt feels when she gets up to walk she feels like she needs to hold onto something. Pt has 20 grandchildren who range from 2 years to 55 years and is also expecting a new great grandchild soon. Pt also enjoys travelling with her husband and tries to exercise for 20 minutes per day with walking or other light activities.   PAIN:  Are you having pain? No  PRECAUTIONS: Fall  WEIGHT BEARING RESTRICTIONS No  FALLS: Has patient fallen in last 6 months? No  LIVING ENVIRONMENT: Lives with: lives with their family and lives with their spouse Lives in: House/apartment Stairs: Yes: External: 3 steps; on right going up Has following equipment at home: Single point cane and Walker - 2 wheeled  PLOF: Independent and Independent with basic ADLs  PATIENT GOALS: Improve strength, balance, and confidence with activities  OBJECTIVE:   LOWER EXTREMITY MMT:    MMT Right Eval Left Eval  Hip flexion 4 4+  Hip extension    Hip abduction 4+ 4+  Hip adduction 5 5  Hip internal rotation    Hip external rotation    Knee flexion 4 4+  Knee extension 4 4+  Ankle dorsiflexion 5 5  Ankle plantarflexion 5 5  Ankle inversion    Ankle eversion    (Blank rows = not tested)   TODAY'S TREATMENT:  03/02/22     therEx: Initiated and practiced initial HEP  Nustep Lvl 3 seat position 8 RPM> 50 x4 minute   Standing with # 4 ankle weight: CGA for stability -march 4x length of // bars -side step 4x length of // bars   Seated with # 4 ankle weights  -Seated marches with upright posture, back away from back of chair for abdominal/trunk activation/stabilization, 10x each LE -Seated LAQ with 3 second holds,  10x each LE, cueing for muscle activation and sequencing for neutral alignment -Seated IR/ER with cueing for stabilizing knee placement with lateral foot movement for optimal muscle recruitment, 10x each LE    Sit to stand weighted ball: yellow weighted ball 10x-terminated due to pain  Seated hold ball: march 10x each LE Seated weighted ball rainbow arc for core activation 10x  Seated adduction ball squeeze 12x 5 second holds  Seated long arc quad squeezing ball between feet 12x  NMR:  Airex balance beam: -lateral stepping with UE support 4x length of // bars -tandem walking with UE support 4x length of // bars    bilateral leg step over orange hurdle 10x each LE; finger tip support        PATIENT EDUCATION: Education details: Pt educated throughout session about proper posture and technique with exercises. Improved exercise technique, movement at target joints, use of target muscles after min to mod verbal, visual, tactile cues.  Person educated: Patient Education method: Explanation Education comprehension: verbalized understanding   HOME EXERCISE PROGRAM: Access Code: [email or web address] URL: [email or web address] Date: 02/04/2022 Prepared by: Rivka Barbara  Exercises - Sit to Stand  - 1 x daily - 7 x weekly - 2 sets - 8 reps - Standing March with Counter Support  - 1 x daily - 7 x weekly - 2 sets - 10 reps - Side Stepping with Counter Support  - 1 x daily - 7 x weekly - 2 sets - 10 reps - Standing in "bowling stance" with counter support   - 1 x daily - 7 x weekly - 2 reps - 30 seocnd hold    GOALS: Goals reviewed with patient? Yes  SHORT TERM GOALS: Target date: 02/24/2022  Patient will be independent in home exercise program to improve strength/mobility for better functional independence with ADLs. Baseline: Goal status: INITIAL    LONG TERM GOALS: Target date: 05/12/2022  Patient will increase FOTO score to equal to or greater than  68   to demonstrate statistically significant improvement in mobility and quality of life.  Baseline: 63 Goal status: INITIAL  2.  Patient (> 70 years old) will complete five times sit to stand test in < 15 seconds  indicating an increased LE strength and improved balance. Baseline: 17.3 s Goal status: INITIAL  3.  Patient will increase Berg Balance score by > 6 points to demonstrate decreased fall risk during functional activities. Baseline: 45 Goal status: INITIAL  4.   Patient will reduce timed up and go to <11 seconds to reduce fall risk and demonstrate improved transfer/gait ability. Baseline: 11.87 Goal status: INITIAL  5.  Patient will increase BLE gross strength to 4+/5 as to improve functional strength for independent gait, increased standing tolerance and increased ADL ability. Baseline: see eval, R side weaker than left grossly  Goal status: INITIAL    ASSESSMENT:  CLINICAL IMPRESSION: Patient presents with excellent motivation. Is slightly fatigued due to busy schedule today. Progression of stabilization and strengthening interventions tolerated well with intermittent seated rest breaks. Sit to stand do hurt knee, terminated early.  Pt continues to demonstrate progress toward goals AEB progression of some interventions this date either in volume or intensity.      OBJECTIVE IMPAIRMENTS decreased activity tolerance, decreased balance, and decreased strength.   ACTIVITY LIMITATIONS bending, squatting, and stairs  PARTICIPATION LIMITATIONS: cleaning, laundry, community activity, and activities going to events for her grandchildren   PERSONAL FACTORS Age and 1-2 comorbidities: HLD, HTN   are also  affecting patient's functional outcome.   REHAB POTENTIAL: Excellent  CLINICAL DECISION MAKING: Stable/uncomplicated  EVALUATION COMPLEXITY: Low  PLAN: PT FREQUENCY: 2x/week  PT DURATION: 12 weeks  PLANNED INTERVENTIONS: Therapeutic exercises, Therapeutic activity, Neuromuscular re-education, Balance training, Gait training, Patient/Family education, Joint mobilization, and Stair training  PLAN FOR NEXT SESSION: continue POC     Janna Arch, PT 03/02/2022, 5:31 PM

## 2022-03-04 ENCOUNTER — Other Ambulatory Visit: Payer: Medicare HMO

## 2022-03-04 ENCOUNTER — Ambulatory Visit: Payer: Medicare HMO

## 2022-03-04 ENCOUNTER — Encounter: Payer: Medicare HMO | Admitting: Occupational Therapy

## 2022-03-04 ENCOUNTER — Ambulatory Visit: Payer: Medicare HMO | Admitting: Speech Pathology

## 2022-03-04 DIAGNOSIS — R41841 Cognitive communication deficit: Secondary | ICD-10-CM | POA: Diagnosis not present

## 2022-03-04 DIAGNOSIS — R262 Difficulty in walking, not elsewhere classified: Secondary | ICD-10-CM

## 2022-03-04 DIAGNOSIS — R269 Unspecified abnormalities of gait and mobility: Secondary | ICD-10-CM | POA: Diagnosis not present

## 2022-03-04 DIAGNOSIS — M6281 Muscle weakness (generalized): Secondary | ICD-10-CM

## 2022-03-04 DIAGNOSIS — R278 Other lack of coordination: Secondary | ICD-10-CM | POA: Diagnosis not present

## 2022-03-04 NOTE — Therapy (Signed)
OUTPATIENT SPEECH LANGUAGE PATHOLOGY TREATMENT   Patient Name: Cheryl Joseph MRN: [id number] DOB:1941/06/26, 81 y.o., female Today's Date: 03/04/2022  PCP: Cheryl Pardon, MD REFERRING PROVIDER: Jennings Books, MD   End of Session - 03/04/22 1521     Visit Number 3    Number of Visits 17    Date for SLP Re-Evaluation 04/16/22    SLP Start Time 1500    SLP Stop Time  1600    SLP Time Calculation (min) 60 min    Activity Tolerance Patient tolerated treatment well             Past Medical History:  Diagnosis Date   Anginal pain ([place])    Arthritis    RA hands(seronegative)   Asthma    Back pain    Cancer ([place])    skin   Cataract    HPV in female 1996   HPV with colposcopy (all neg paps since)   Hyperlipidemia    Hypertension    Incarcerated ventral hernia    Obesity    Osteopenia    Pre-diabetes    Shortness of breath dyspnea    Shoulder pain    Past Surgical History:  Procedure Laterality Date   CATARACT EXTRACTION, BILATERAL Bilateral    COLONOSCOPY  06/01/2004   COLONOSCOPY WITH PROPOFOL N/A 05/01/2015   Procedure: COLONOSCOPY WITH PROPOFOL;  Surgeon: Cheryl Silvas, MD;  Location: [facility];  Service: Endoscopy;  Laterality: N/A;   CORRECTION HAMMER TOE Right    2nd toe   JOINT REPLACEMENT Right    right knee replacement January 2021   SQUAMOUS CELL CARCINOMA EXCISION Left 12/2017   left arm   TONSILLECTOMY     VAGINAL HYSTERECTOMY  2015   with pelvic organ prolapse surgery    XI ROBOTIC ASSISTED VENTRAL HERNIA N/A 06/10/2021   Procedure: XI ROBOTIC ASSISTED VENTRAL HERNIA;  Surgeon: Cheryl Pun, MD;  Location: [facility];  Service: General;  Laterality: N/A;   Patient Active Problem List   Diagnosis Date Noted   Chronic headache 02/25/2022   Snoring 02/25/2022   Acute bilateral low back pain without sciatica 01/29/2022   Microscopic hematuria 01/29/2022   Abnormal urine 01/29/2022   History of stroke 28/41/3244   Umbilical  hernia 07/28/7251   Frequent stools 01/04/2020   Pre-operative general physical examination 07/06/2019   Lower abdominal pain 08/16/2018   Stress reaction 09/05/2017   Estrogen deficiency 05/25/2016   Routine general medical examination at a health care facility 11/23/2015   Hammertoe 10/28/2015   Frequent loose stools 02/14/2015   Diverticulosis of colon without hemorrhage 02/14/2015   Colon cancer screening 08/19/2014   Abdominal pain, left lower quadrant 03/18/2014   Essential hypertension 02/25/2014   Encounter for Medicare annual wellness exam 07/16/2013   Prediabetes 05/14/2009   Rheumatoid arthritis with positive rheumatoid factor ([place]) 05/13/2008   Osteopenia 03/07/2007   Hyperlipidemia 01/03/2007   Obesity (BMI 30-39.9) 01/03/2007   ALLERGIC RHINITIS, SEASONAL 01/03/2007   ASTHMA 01/03/2007   OVERACTIVE BLADDER 01/03/2007    ONSET DATE: 12/31/2021   REFERRING DIAG: CVA  THERAPY DIAG:  Cognitive communication deficit  Rationale for Evaluation and Treatment Rehabilitation  SUBJECTIVE:   SUBJECTIVE STATEMENT:  "It doesn't happen very often," re: wordfinding Pt accompanied by: self  PERTINENT HISTORY: Patient is an 81 y.o. female with past medical history including HTN, HLD, and asthma who was admitted 12/31/21 with right leg weakness and right arm numbness and was found to have CVA. OT requested SLP  evaluation due to pt reports of wordfinding difficulties.   DIAGNOSTIC FINDINGS: MRI brain 12/31/2021: Small acute perforator infarct in the left basal ganglia.  PAIN:  Are you having pain? 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 She reports she feels she is able to think of a word with brief hesitation. Continued training in wordfinding strategies today, and implemented these during game of Taboo. In  mod complex conversation, pt had one instance of anomia, during which she provided an adequate substitution, and one instance of losing her train of thought.  PATIENT EDUCATION: Education details: Activities to support cognitive-linguistic function at home, likely brief course of therapy to train pt in wordfinding strategies and compensations Person educated: Patient Education method: Verbal cues Education comprehension: verbalized understanding   GOALS: Goals reviewed with patient? Yes  SHORT TERM GOALS: Target date: 10 sessions  Patient will complete high-level wordfinding tasks >90% acc with rare min cues.  Baseline: Goal status: INITIAL  2.  Patient will use semantic feature analysis to generate 4+ descriptions for targets >90% acc.  Baseline:  Goal status: INITIAL  3.  Patient will use anomia compensations in 10 minutes mod complex conversation with rare min cues.  Baseline:  Goal status: INITIAL   LONG TERM GOALS: Target date: 04/16/2022  Patient will use anomia compensations in complex conversation of 15-20 minutes with modified independence. Baseline:  Goal status: INITIAL  2.  Patient will report successful use of strategies (anomia compensations, pre-planning) to improve communication in community activities over 3 consecutive visits. Baseline:  Goal status: INITIAL  3.  Pt will report improved communication effectiveness as measured by the Communication Effectiveness Survey.  Baseline:  Goal status: INITIAL    ASSESSMENT:  CLINICAL IMPRESSION: Cheryl Joseph presents with mild verbal expression impairment characterized by changes in wordfinding and thought organization abilities since her CVA. Pt receptive to using strategies and demonstrating increasing ability for self-cuing as session progressed. Only one instance of anomia and one instance of thought organization difficulty in session today; patient reporting minimal impact on her daily activities.  Anticipate d/c sooner than orginally projected, with 1-2 additional sessions necessary for education and strategy training. Given her previously high level of function and enjoyment of language tasks (reads daily and studies Joseph with her husband, leads a Bible study), and reports of functional impairments leading to frustration during communication exchanges, she would benefit from brief course of ST with focus on training and generalization of strategies.   OBJECTIVE IMPAIRMENTS include expressive language. These impairments are limiting patient from effectively communicating at home and in community. Factors affecting potential to achieve goals and functional outcome are ability to learn/carryover information and previous level of function. Patient will benefit from skilled SLP services to address above impairments and improve overall function.  REHAB POTENTIAL: Excellent  PLAN: SLP FREQUENCY: 1-2x/week  SLP DURATION: 8 weeks  PLANNED INTERVENTIONS: Language facilitation, Environmental controls, Cueing hierachy, Cognitive reorganization, Internal/external aids, Functional tasks, Multimodal communication approach, SLP instruction and feedback, Compensatory strategies, and Patient/family education      Deneise Lever, MS, Actor [phone]

## 2022-03-04 NOTE — Therapy (Signed)
OUTPATIENT PHYSICAL THERAPY TREATMENT/ Progress Report 01/27/22- 03/04/22   Patient Name: Cheryl Joseph MRN: [id number] DOB:1941-03-19, 81 y.o., female Today's Date: 03/04/2022   PCP:   Judy Pimple, MD   REFERRING PROVIDER: Lonell Face, MD   PT End of Session - 03/04/22 1354     Visit Number 10    Number of Visits 24    Date for PT Re-Evaluation 04/21/22    Authorization Type [employer]    Authorization Time Period 01/27/22-04/21/22    Progress Note Due on Visit 10    PT Start Time 1350    PT Stop Time 1430    PT Time Calculation (min) 40 min    Equipment Utilized During Treatment Gait belt    Activity Tolerance Patient tolerated treatment well;No increased pain    Behavior During Therapy WFL for tasks assessed/performed                    Past Medical History:  Diagnosis Date   Anginal pain (HCC)    Arthritis    RA hands(seronegative)   Asthma    Back pain    Cancer (HCC)    skin   Cataract    HPV in female 1996   HPV with colposcopy (all neg paps since)   Hyperlipidemia    Hypertension    Incarcerated ventral hernia    Obesity    Osteopenia    Pre-diabetes    Shortness of breath dyspnea    Shoulder pain    Past Surgical History:  Procedure Laterality Date   CATARACT EXTRACTION, BILATERAL Bilateral    COLONOSCOPY  06/01/2004   COLONOSCOPY WITH PROPOFOL N/A 05/01/2015   Procedure: COLONOSCOPY WITH PROPOFOL;  Surgeon: Scot Jun, MD;  Location: [facility] END[facility]COPY;  Service: Endoscopy;  Laterality: N/A;   CORRECTION HAMMER TOE Right    2nd toe   JOINT REPLACEMENT Right    right knee replacement January 2021   SQUAMOUS CELL CARCINOMA EXCISION Left 12/2017   left arm   TONSILLECTOMY     VAGINAL HYSTERECTOMY  2015   with pelvic organ prolapse surgery    XI ROBOTIC ASSISTED VENTRAL HERNIA N/A 06/10/2021   Procedure: XI ROBOTIC ASSISTED VENTRAL HERNIA;  Surgeon: Carolan Shiver, MD;  Location: [facility] ORS;  Service: General;   Laterality: N/A;   Patient Active Problem List   Diagnosis Date Noted   Chronic headache 02/25/2022   Snoring 02/25/2022   Acute bilateral low back pain without sciatica 01/29/2022   Microscopic hematuria 01/29/2022   Abnormal urine 01/29/2022   History of stroke 12/31/2021   Umbilical hernia 01/05/2021   Frequent stools 01/04/2020   Pre-operative general physical examination 07/06/2019   Lower abdominal pain 08/16/2018   Stress reaction 09/05/2017   Estrogen deficiency 05/25/2016   Routine general medical examination at a health care facility 11/23/2015   Hammertoe 10/28/2015   Frequent loose stools 02/14/2015   Diverticulosis of colon without hemorrhage 02/14/2015   Colon cancer screening 08/19/2014   Abdominal pain, left lower quadrant 03/18/2014   Essential hypertension 02/25/2014   Encounter for Medicare annual wellness exam 07/16/2013   Prediabetes 05/14/2009   Rheumatoid arthritis with positive rheumatoid factor (HCC) 05/13/2008   Osteopenia 03/07/2007   Hyperlipidemia 01/03/2007   Obesity (BMI 30-39.9) 01/03/2007   ALLERGIC RHINITIS, SEASONAL 01/03/2007   ASTHMA 01/03/2007   OVERACTIVE BLADDER 01/03/2007    ONSET DATE: 12/31/21  REFERRING DIAG: K74.25 (ICD-10-CM) - Personal history of transient ischemic attack (TIA), and cerebral  infarction without residual deficits  THERAPY DIAG:  Muscle weakness (generalized)  Abnormality of gait and mobility  Difficulty in walking, not elsewhere classified  Rationale for Evaluation and Treatment Rehabilitation  SUBJECTIVE:                                                                                                                                                                                              SUBJECTIVE STATEMENT: Pt reports pt started a new cholesterol medication and finally is having improved success with HA. She has been consistent with HEP. She feels like legs are getting stronger, but still wants to be a  little stronger, especially on Left leg. She is also concerned that her balance has not improved much.   PERTINENT HISTORY: Pt reports he biggest concern is her balance at the moment. She reports she is not dizzy necessarily but she is off. Pt reports no falls in the last 6 months and she does not utilize an AD at this time. Pt has had PT in the past but balance was not the main focus, pt also has had PT for her R TKA.With her balance pt feels when she gets up to walk she feels like she needs to hold onto something. Pt has 20 grandchildren who range from 2 years to 40 years and is also expecting a new great grandchild soon. Pt also enjoys travelling with her husband and tries to exercise for 20 minutes per day with walking or other light activities.   PAIN:  Are you having pain? No  PRECAUTIONS: Fall  WEIGHT BEARING RESTRICTIONS No  FALLS: Has patient fallen in last 6 months? No  LIVING ENVIRONMENT: Lives with: lives with their family and lives with their spouse Lives in: House/apartment Stairs: Yes: External: 3 steps; on right going up Has following equipment at home: Single point cane and Walker - 2 wheeled  PLOF: Independent and Independent with basic ADLs  PATIENT GOALS: Improve strength, balance, and confidence with activities  OBJECTIVE:   LOWER EXTREMITY MMT:    MMT Right Eval Left Eval Right 8/10 Left 8/10  Hip flexion 4 4+ 4+/5 5/5  Hip extension   5/5(standing) 5/5 (standing)  Hip abduction 4+ 4+ 4-/5 (supine) 4-/5 (supine)   Hip adduction 5 5    Hip internal rotation   5/5 5/5^  Hip external rotation   4+/5 5/5^  Knee flexion 4 4+ 5/5 5/5  Knee extension 4 4+ 5/5 5/5  Ankle dorsiflexion 5 5 5/5 5/5  Ankle plantarflexion 5 5    (Blank rows = not tested) Steffanie Rainwater creaking  TODAY'S TREATMENT:  03/04/22  Nustep Lvl 3 seat position 8 RPM> 50 x4 minute Reassessment:  FOTO 5xSTS MMT TUG *education on actiivty modification regarding Left knee DJD           PATIENT EDUCATION: Education details: Pt educated throughout session about proper posture and technique with exercises. Improved exercise technique, movement at target joints, use of target muscles after min to mod verbal, visual, tactile cues.  Person educated: Patient Education method: Explanation Education comprehension: verbalized understanding   HOME EXERCISE PROGRAM: Access Code: [email or web address] URL: [email or web address] Date: 02/04/2022 Prepared by: Thresa Ross  Exercises - Sit to Stand  - 1 x daily - 7 x weekly - 2 sets - 8 reps - Standing March with Counter Support  - 1 x daily - 7 x weekly - 2 sets - 10 reps - Side Stepping with Counter Support  - 1 x daily - 7 x weekly - 2 sets - 10 reps - Standing in "bowling stance" with counter support   - 1 x daily - 7 x weekly - 2 reps - 30 second hold    GOALS: Goals reviewed with patient? Yes  SHORT TERM GOALS: Target date: 02/24/2022  Patient will be independent in home exercise program to improve strength/mobility for better functional independence with ADLs. Baseline: Goal status: Achieved     LONG TERM GOALS: Target date: 05/12/2022  Patient will increase FOTO score to equal to or greater than  68   to demonstrate statistically significant improvement in mobility and quality of life.  Baseline: 62 (03/04/22)  Goal status: on-going  2.  Patient (> 37 years old) will complete five times sit to stand test in < 15 seconds indicating an increased LE strength and improved balance. Baseline: 11.98sec (03/04/22)  Goal status: achieved  3.  Patient will increase Berg Balance score by > 6 points to demonstrate decreased fall risk during functional activities. Baseline: 45 Goal status: deffered   4.   Patient will reduce timed up and go to <11 seconds to reduce fall risk and demonstrate improved transfer/gait ability. Baseline: 8.55sec (03/04/22)  Goal status: achieved  5.  Patient will increase BLE gross  strength to 4+/5 as to improve functional strength for independent gait, increased standing tolerance and increased ADL ability. Baseline: see eval, R side weaker than left grossly  Goal status: partially met; hip ADBCT weakness persists bilat     ASSESSMENT:  CLINICAL IMPRESSION: Reassessment revealing of uncanny improvements in TUG and 5xSTS, anticipated improvements in MMT with new identified deficits in bilat hip abductors. FOTO survey does not show any change at all, which is not congruent with pt's intuitive feeling of improvement.Pt progressing ahead of schedule toward goals of treatment encouraged her to self DC from [facility] rehab when ready to transition to a program more focal to her Left knee DJD weakness. Pt continues to demonstrate progress toward goals AEB progression of some interventions this date either in volume or intensity.      OBJECTIVE IMPAIRMENTS decreased activity tolerance, decreased balance, and decreased strength.   ACTIVITY LIMITATIONS bending, squatting, and stairs  PARTICIPATION LIMITATIONS: cleaning, laundry, community activity, and activities going to events for her grandchildren   PERSONAL FACTORS Age and 1-2 comorbidities: HLD, HTN   are also affecting patient's functional outcome.   REHAB POTENTIAL: Excellent  CLINICAL DECISION MAKING: Stable/uncomplicated  EVALUATION COMPLEXITY: Low  PLAN: PT FREQUENCY: 2x/week  PT DURATION: 12 weeks  PLANNED INTERVENTIONS: Therapeutic exercises, Therapeutic activity, Neuromuscular re-education, Balance training, Gait training, Patient/Family education, Joint mobilization, and  Stair training  PLAN FOR NEXT SESSION: continue POC       Armonie Mettler C, PT 03/04/2022, 1:57 PM  1:58 PM, 03/04/22 Cheryl Joseph, PT, DPT Physical Therapist - [facility]  Outpatient Physical Therapy- Main Campus [phone]

## 2022-03-05 ENCOUNTER — Ambulatory Visit: Payer: Medicare HMO | Admitting: Physical Therapy

## 2022-03-08 NOTE — Therapy (Signed)
OUTPATIENT PHYSICAL THERAPY TREATMENT   Patient Name: Cheryl Joseph MRN: [id number] DOB:01-Nov-1940, 81 y.o., female Today's Date: 03/09/2022   PCP:   Abner Greenspan, MD   REFERRING PROVIDER: Vladimir Crofts, MD   PT End of Session - 03/09/22 1350     Visit Number 11    Number of Visits 24    Date for PT Re-Evaluation 04/21/22    Authorization Type [employer]    Authorization Time Period 01/27/22-04/21/22    Progress Note Due on Visit 10    PT Start Time 1345    PT Stop Time 1429    PT Time Calculation (min) 44 min    Equipment Utilized During Treatment Gait belt    Activity Tolerance Patient tolerated treatment well;No increased pain    Behavior During Therapy WFL for tasks assessed/performed                     Past Medical History:  Diagnosis Date   Anginal pain ([place])    Arthritis    RA hands(seronegative)   Asthma    Back pain    Cancer (HCC)    skin   Cataract    HPV in female 1996   HPV with colposcopy (all neg paps since)   Hyperlipidemia    Hypertension    Incarcerated ventral hernia    Obesity    Osteopenia    Pre-diabetes    Shortness of breath dyspnea    Shoulder pain    Past Surgical History:  Procedure Laterality Date   CATARACT EXTRACTION, BILATERAL Bilateral    COLONOSCOPY  06/01/2004   COLONOSCOPY WITH PROPOFOL N/A 05/01/2015   Procedure: COLONOSCOPY WITH PROPOFOL;  Surgeon: Manya Silvas, MD;  Location: [facility];  Service: Endoscopy;  Laterality: N/A;   CORRECTION HAMMER TOE Right    2nd toe   JOINT REPLACEMENT Right    right knee replacement January 2021   SQUAMOUS CELL CARCINOMA EXCISION Left 12/2017   left arm   TONSILLECTOMY     VAGINAL HYSTERECTOMY  2015   with pelvic organ prolapse surgery    XI ROBOTIC ASSISTED VENTRAL HERNIA N/A 06/10/2021   Procedure: XI ROBOTIC ASSISTED VENTRAL HERNIA;  Surgeon: Herbert Pun, MD;  Location: [facility] ORS;  Service: General;  Laterality: N/A;   Patient Active  Problem List   Diagnosis Date Noted   Chronic headache 02/25/2022   Snoring 02/25/2022   Acute bilateral low back pain without sciatica 01/29/2022   Microscopic hematuria 01/29/2022   Abnormal urine 01/29/2022   History of stroke 93/81/0175   Umbilical hernia 05/19/8526   Frequent stools 01/04/2020   Pre-operative general physical examination 07/06/2019   Lower abdominal pain 08/16/2018   Stress reaction 09/05/2017   Estrogen deficiency 05/25/2016   Routine general medical examination at a health care facility 11/23/2015   Hammertoe 10/28/2015   Frequent loose stools 02/14/2015   Diverticulosis of colon without hemorrhage 02/14/2015   Colon cancer screening 08/19/2014   Abdominal pain, left lower quadrant 03/18/2014   Essential hypertension 02/25/2014   Encounter for Medicare annual wellness exam 07/16/2013   Prediabetes 05/14/2009   Rheumatoid arthritis with positive rheumatoid factor ([place]) 05/13/2008   Osteopenia 03/07/2007   Hyperlipidemia 01/03/2007   Obesity (BMI 30-39.9) 01/03/2007   ALLERGIC RHINITIS, SEASONAL 01/03/2007   ASTHMA 01/03/2007   OVERACTIVE BLADDER 01/03/2007    ONSET DATE: 12/31/21  REFERRING DIAG: P82.42 (ICD-10-CM) - Personal history of transient ischemic attack (TIA), and cerebral infarction without residual  deficits  THERAPY DIAG:  Muscle weakness (generalized)  Abnormality of gait and mobility  Difficulty in walking, not elsewhere classified  Rationale for Evaluation and Treatment Rehabilitation  SUBJECTIVE:                                                                                                                                                                                              SUBJECTIVE STATEMENT: Patient reports her head is improving but still present. Still having issues with stairs due to L knee.    PERTINENT HISTORY: Pt reports he biggest concern is her balance at the moment. She reports she is not dizzy necessarily but  she is off. Pt reports no falls in the last 6 months and she does not utilize an AD at this time. Pt has had PT in the past but balance was not the main focus, pt also has had PT for her R TKA.  With her balance pt feels when she gets up to walk she feels like she needs to hold onto something. Pt has 20 grandchildren who range from 2 years to 8 years and is also expecting a new great grandchild soon. Pt also enjoys travelling with her husband and tries to exercise for 20 minutes per day with walking or other light activities.   PAIN:  Are you having pain? No  PRECAUTIONS: Fall  WEIGHT BEARING RESTRICTIONS No  FALLS: Has patient fallen in last 6 months? No  LIVING ENVIRONMENT: Lives with: lives with their family and lives with their spouse Lives in: House/apartment Stairs: Yes: External: 3 steps; on right going up Has following equipment at home: Single point cane and Walker - 2 wheeled  PLOF: Independent and Independent with basic ADLs  PATIENT GOALS: Improve strength, balance, and confidence with activities  OBJECTIVE:   LOWER EXTREMITY MMT:    MMT Right Eval Left Eval  Hip flexion 4 4+  Hip extension    Hip abduction 4+ 4+  Hip adduction 5 5  Hip internal rotation    Hip external rotation    Knee flexion 4 4+  Knee extension 4 4+  Ankle dorsiflexion 5 5  Ankle plantarflexion 5 5  Ankle inversion    Ankle eversion    (Blank rows = not tested)   TODAY'S TREATMENT:  03/09/22     therEx: Initiated and practiced initial HEP  PreCore Leg press BLE #55 10x; 2 sets  Nustep Lvl 3 seat position 8 RPM> 50 x4 minute   Standing with # 4 ankle weight: CGA for stability -march 10x each LE  -hip extension 10x each L E  3  way hip hike 8x each LE    Seated with # 4 ankle weights  -Seated marches with upright posture, back away from back of chair for abdominal/trunk activation/stabilization, 10x each LE -Seated LAQ with 3 second holds, 10x each LE, cueing for muscle  activation and sequencing for neutral alignment -Seated IR/ER with cueing for stabilizing knee placement with lateral foot movement for optimal muscle recruitment, 10x each LE  GTB abduction 20x   NMR:   Balance master ; x3 minutes with weight shift focus  ambulate in hallway: -walk while naming alphabetical order animals x 160 ft     Airex pad: reach and play word game with dual task of reaching x3 minutes     PATIENT EDUCATION: Education details: Pt educated throughout session about proper posture and technique with exercises. Improved exercise technique, movement at target joints, use of target muscles after min to mod verbal, visual, tactile cues.  Person educated: Patient Education method: Explanation Education comprehension: verbalized understanding   HOME EXERCISE PROGRAM: Access Code: [email or web address] URL: [email or web address] Date: 02/04/2022 Prepared by: Rivka Barbara  Exercises - Sit to Stand  - 1 x daily - 7 x weekly - 2 sets - 8 reps - Standing March with Counter Support  - 1 x daily - 7 x weekly - 2 sets - 10 reps - Side Stepping with Counter Support  - 1 x daily - 7 x weekly - 2 sets - 10 reps - Standing in "bowling stance" with counter support   - 1 x daily - 7 x weekly - 2 reps - 30 seocnd hold    GOALS: Goals reviewed with patient? Yes  SHORT TERM GOALS: Target date: 02/24/2022  Patient will be independent in home exercise program to improve strength/mobility for better functional independence with ADLs. Baseline: Goal status: INITIAL    LONG TERM GOALS: Target date: 05/12/2022  Patient will increase FOTO score to equal to or greater than  68   to demonstrate statistically significant improvement in mobility and quality of life.  Baseline: 63 Goal status: INITIAL  2.  Patient (> 45 years old) will complete five times sit to stand test in < 15 seconds indicating an increased LE strength and improved balance. Baseline: 17.3 s Goal  status: INITIAL  3.  Patient will increase Berg Balance score by > 6 points to demonstrate decreased fall risk during functional activities. Baseline: 45 Goal status: INITIAL  4.   Patient will reduce timed up and go to <11 seconds to reduce fall risk and demonstrate improved transfer/gait ability. Baseline: 11.87 Goal status: INITIAL  5.  Patient will increase BLE gross strength to 4+/5 as to improve functional strength for independent gait, increased standing tolerance and increased ADL ability. Baseline: see eval, R side weaker than left grossly  Goal status: INITIAL    ASSESSMENT:  CLINICAL IMPRESSION: Patient presents with excellent motivation. She tolerates progressive strengthening and stabilization interventions. Patient's hips will continue to benefit from strengthening.  Pt continues to demonstrate progress toward goals AEB progression of some interventions this date either in volume or intensity.      OBJECTIVE IMPAIRMENTS decreased activity tolerance, decreased balance, and decreased strength.   ACTIVITY LIMITATIONS bending, squatting, and stairs  PARTICIPATION LIMITATIONS: cleaning, laundry, community activity, and activities going to events for her grandchildren   PERSONAL FACTORS Age and 1-2 comorbidities: HLD, HTN   are also affecting patient's functional outcome.   REHAB POTENTIAL: Excellent  CLINICAL DECISION MAKING: Stable/uncomplicated  EVALUATION COMPLEXITY: Low  PLAN:  PT FREQUENCY: 2x/week  PT DURATION: 12 weeks  PLANNED INTERVENTIONS: Therapeutic exercises, Therapeutic activity, Neuromuscular re-education, Balance training, Gait training, Patient/Family education, Joint mobilization, and Stair training  PLAN FOR NEXT SESSION: continue POC     Janna Arch, PT 03/09/2022, 2:30 PM

## 2022-03-09 ENCOUNTER — Ambulatory Visit: Payer: Medicare HMO

## 2022-03-09 ENCOUNTER — Ambulatory Visit: Payer: Medicare HMO | Admitting: Speech Pathology

## 2022-03-09 ENCOUNTER — Encounter: Payer: Medicare HMO | Admitting: Occupational Therapy

## 2022-03-09 DIAGNOSIS — R269 Unspecified abnormalities of gait and mobility: Secondary | ICD-10-CM | POA: Diagnosis not present

## 2022-03-09 DIAGNOSIS — R278 Other lack of coordination: Secondary | ICD-10-CM | POA: Diagnosis not present

## 2022-03-09 DIAGNOSIS — R41841 Cognitive communication deficit: Secondary | ICD-10-CM

## 2022-03-09 DIAGNOSIS — R262 Difficulty in walking, not elsewhere classified: Secondary | ICD-10-CM

## 2022-03-09 DIAGNOSIS — M6281 Muscle weakness (generalized): Secondary | ICD-10-CM | POA: Diagnosis not present

## 2022-03-09 NOTE — Patient Instructions (Signed)
Tips for Communication/wordfinding difficulties  Say one thing at a time Don't  rush - slow down, be patient Talk face to face Reduce background noise Relax - be natural Use pen and paper Write down key words Draw diagrams or pictures Let other people know you need a little extra time Recap - check you both understand  Describing words  What group does it belong to?  What do I use it for?  Where can I find it?  What does it LOOK like?  What other words go with it?  What is the 1st sound of the word?   Many Ways to Communicate  Describe it Write it Draw it Gesture it Use related words  Resources  [employer] Therapy app by Adella Hare (or access from desktop at [email or web address].[email or web address].be) [employer] therapy apps [employer] [facility]  Games Taboo Scatter[employer]ories  The very best way to improve your communication is to DO the things that you want to improve. 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

## 2022-03-09 NOTE — Therapy (Signed)
OUTPATIENT SPEECH LANGUAGE PATHOLOGY TREATMENT AND DISCHARGE SUMMARY   Patient Name: Cheryl Joseph MRN: [id number] DOB:[birth year], 81 y.o., female Today's Date: 03/09/2022   SPEECH THERAPY DISCHARGE SUMMARY  Visits from Start of Care: 4  Current functional level related to goals / functional outcomes: Patient is conversing at complex conversation level with no instances of anomia or hesitation over entirety of session (60 minutes). 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   Remaining deficits: Occasional anomia outside ST room associated with time pressure/feeling anxious in social situations.   Education / Equipment: Compensations for anomia, activities and strategies to support ongoing language recovery   Patient agrees to discharge. Patient goals were met. Patient is being discharged due to meeting the stated rehab goals.Marland Kitchen    PCP: Loura Pardon, MD REFERRING PROVIDER: Jennings Books, MD   End of Session - 03/09/22 1504     Visit Number 4    Number of Visits 17    Date for SLP Re-Evaluation 04/16/22    SLP Start Time 1500    SLP Stop Time  1600    SLP Time Calculation (min) 60 min    Activity Tolerance Patient tolerated treatment well             Past Medical History:  Diagnosis Date   Anginal pain ([place])    Arthritis    RA hands(seronegative)   Asthma    Back pain    Cancer ([place])    skin   Cataract    HPV in female 1996   HPV with colposcopy (all neg paps since)   Hyperlipidemia    Hypertension    Incarcerated ventral hernia    Obesity    Osteopenia    Pre-diabetes    Shortness of breath dyspnea    Shoulder pain    Past Surgical History:  Procedure Laterality Date   CATARACT EXTRACTION, BILATERAL Bilateral    COLONOSCOPY  06/01/2004   COLONOSCOPY WITH PROPOFOL N/A 05/01/2015   Procedure: COLONOSCOPY WITH PROPOFOL;   Surgeon: Manya Silvas, MD;  Location: [place];  Service: Endoscopy;  Laterality: N/A;   CORRECTION HAMMER TOE Right    2nd toe   JOINT REPLACEMENT Right    right knee replacement January 2021   SQUAMOUS CELL CARCINOMA EXCISION Left 12/2017   left arm   TONSILLECTOMY     VAGINAL HYSTERECTOMY  2015   with pelvic organ prolapse surgery    XI ROBOTIC ASSISTED VENTRAL HERNIA N/A 06/10/2021   Procedure: XI ROBOTIC ASSISTED VENTRAL HERNIA;  Surgeon: Herbert Pun, MD;  Location: [facility];  Service: General;  Laterality: N/A;   Patient Active Problem List   Diagnosis Date Noted   Chronic headache 02/25/2022   Snoring 02/25/2022   Acute bilateral low back pain without sciatica 01/29/2022   Microscopic hematuria 01/29/2022   Abnormal urine 01/29/2022   History of stroke 82/42/3536   Umbilical hernia 14/43/1540   Frequent stools 01/04/2020   Pre-operative general physical examination 07/06/2019   Lower abdominal pain 08/16/2018   Stress reaction 09/05/2017   Estrogen deficiency 05/25/2016   Routine general medical examination at a health care facility 11/23/2015   Hammertoe 10/28/2015   Frequent loose stools 02/14/2015   Diverticulosis of colon without hemorrhage 02/14/2015   Colon cancer screening 08/19/2014   Abdominal pain, left lower quadrant 03/18/2014   Essential hypertension 02/25/2014   Encounter for Medicare annual wellness exam 07/16/2013  Prediabetes 05/14/2009   Rheumatoid arthritis with positive rheumatoid factor ([place]) 05/13/2008   Osteopenia 03/07/2007   Hyperlipidemia 01/03/2007   Obesity (BMI 30-39.9) 01/03/2007   ALLERGIC RHINITIS, SEASONAL 01/03/2007   ASTHMA 01/03/2007   OVERACTIVE BLADDER 01/03/2007    ONSET DATE: 12/31/2021   REFERRING DIAG: CVA  THERAPY DIAG:  Cognitive communication deficit  Rationale for Evaluation and Treatment Rehabilitation  SUBJECTIVE:   SUBJECTIVE STATEMENT:  Pt reports resolving communication breakdowns with  spouse, daughter, and during Sunday School Pt accompanied by: self  PERTINENT HISTORY: Patient is an 81 y.o. female with past medical history including HTN, HLD, and asthma who was admitted 12/31/21 with right leg weakness and right arm numbness and was found to have CVA. OT requested SLP evaluation due to pt reports of wordfinding difficulties.   DIAGNOSTIC FINDINGS: MRI brain 12/31/2021: Small acute perforator infarct in the left basal ganglia.  PAIN:  Are you having pain? No   PATIENT GOALS Keep doing my teaching (bible study), not get so frustrated with words  OBJECTIVE:   TODAY'S TREATMENT: SLP facilitated complex conversation throughout session. In 2x 20-25 minute conversations, pt exhibited fluent speech without any anomia or hesitation. Reports she borrowed Taboo game from her daughter to play with spouse. 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 Reinforced anomia compensations and provided list of activities and community resources.  PATIENT EDUCATION: Education details: Activities to support cognitive-linguistic function at home, likely brief course of therapy to train pt in wordfinding strategies and compensations Person educated: Patient Education method: Verbal cues Education comprehension: verbalized understanding   GOALS: Goals reviewed with patient? Yes  SHORT TERM GOALS: Target date: 10 sessions  Patient will complete high-level wordfinding tasks >90% acc with rare min cues.  Baseline: Goal status: MET  2.  Patient will use semantic feature analysis to generate 4+ descriptions for targets >90% acc.  Baseline:  Goal status: MET  3.  Patient will use anomia compensations in 10 minutes mod complex conversation with rare min cues.  Baseline:  Goal status: MET   LONG TERM GOALS:  Target date: 04/16/2022  Patient will use anomia compensations in complex conversation of 15-20 minutes with modified independence. Baseline:  Goal status: MET  2.  Patient will report successful use of strategies (anomia compensations, pre-planning) to improve communication in community activities over 3 consecutive visits. Baseline:  Goal status: MET  3.  Pt will report improved communication effectiveness as measured by the Communication Effectiveness Survey.  Baseline:  Goal status: DEFERRED    ASSESSMENT:  CLINICAL IMPRESSION: Lalana Wachter presents with expressive language abilities appearing grossly within functional limits. Pt did not exhibit any episodes of anomia or decreased thought organization in session today, and reports using compensations effectively on the rare occasion that they occur outside [place].  Pt verbalizes using semantic feature analysis strategy to resolve communication breakdown over the weekend. 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  Education completed regarding activities to support ongoing language recovery and Proofreader. Education on role of anxiety in increasing frequency of communication breakdowns and using reset strategy (pause, take a break, resume more slowly). At this time pt in agreement with d/c.   OBJECTIVE IMPAIRMENTS include expressive language. These impairments are limiting patient from effectively  communicating at home and in community. Factors affecting potential to achieve goals and functional outcome are ability to learn/carryover information and previous level of function. Patient will benefit from skilled SLP services to address above impairments and improve overall function.  REHAB POTENTIAL: Excellent  PLAN: SLP FREQUENCY: 1-2x/week  SLP DURATION: 8 weeks  PLANNED INTERVENTIONS: Language facilitation, Environmental controls, Cueing  hierachy, Cognitive reorganization, Internal/external aids, Functional tasks, Multimodal communication approach, SLP instruction and feedback, Compensatory strategies, and Patient/family education      Deneise Lever, MS, Actor [phone]

## 2022-03-11 ENCOUNTER — Ambulatory Visit: Payer: Medicare HMO | Admitting: Physical Therapy

## 2022-03-11 ENCOUNTER — Ambulatory Visit: Payer: Medicare HMO | Admitting: Speech Pathology

## 2022-03-11 ENCOUNTER — Encounter: Payer: Medicare HMO | Admitting: Occupational Therapy

## 2022-03-11 DIAGNOSIS — R262 Difficulty in walking, not elsewhere classified: Secondary | ICD-10-CM

## 2022-03-11 DIAGNOSIS — R278 Other lack of coordination: Secondary | ICD-10-CM

## 2022-03-11 DIAGNOSIS — M6281 Muscle weakness (generalized): Secondary | ICD-10-CM

## 2022-03-11 DIAGNOSIS — R41841 Cognitive communication deficit: Secondary | ICD-10-CM | POA: Diagnosis not present

## 2022-03-11 DIAGNOSIS — R269 Unspecified abnormalities of gait and mobility: Secondary | ICD-10-CM

## 2022-03-11 NOTE — Therapy (Signed)
OUTPATIENT PHYSICAL THERAPY TREATMENT   Patient Name: Cheryl Joseph MRN: [id number] DOB:28-May-1941, 81 y.o., female Today's Date: 03/11/2022   PCP:   Abner Greenspan, MD   REFERRING PROVIDER: Vladimir Crofts, MD   PT End of Session - 03/11/22 1101     Visit Number 12    Number of Visits 24    Date for PT Re-Evaluation 04/21/22    Authorization Type [employer]    Authorization Time Period 01/27/22-04/21/22    Progress Note Due on Visit 10    PT Start Time 1102    PT Stop Time 1144    PT Time Calculation (min) 42 min    Equipment Utilized During Treatment Gait belt    Activity Tolerance Patient tolerated treatment well;No increased pain    Behavior During Therapy WFL for tasks assessed/performed                      Past Medical History:  Diagnosis Date   Anginal pain ([place])    Arthritis    RA hands(seronegative)   Asthma    Back pain    Cancer ([facility])    skin   Cataract    HPV in female 1996   HPV with colposcopy (all neg paps since)   Hyperlipidemia    Hypertension    Incarcerated ventral hernia    Obesity    Osteopenia    Pre-diabetes    Shortness of breath dyspnea    Shoulder pain    Past Surgical History:  Procedure Laterality Date   CATARACT EXTRACTION, BILATERAL Bilateral    COLONOSCOPY  06/01/2004   COLONOSCOPY WITH PROPOFOL N/A 05/01/2015   Procedure: COLONOSCOPY WITH PROPOFOL;  Surgeon: Manya Silvas, MD;  Location: [place];  Service: Endoscopy;  Laterality: N/A;   CORRECTION HAMMER TOE Right    2nd toe   JOINT REPLACEMENT Right    right knee replacement January 2021   SQUAMOUS CELL CARCINOMA EXCISION Left 12/2017   left arm   TONSILLECTOMY     VAGINAL HYSTERECTOMY  2015   with pelvic organ prolapse surgery    XI ROBOTIC ASSISTED VENTRAL HERNIA N/A 06/10/2021   Procedure: XI ROBOTIC ASSISTED VENTRAL HERNIA;  Surgeon: Herbert Pun, MD;  Location: [facility];  Service: General;  Laterality: N/A;   Patient Active  Problem List   Diagnosis Date Noted   Chronic headache 02/25/2022   Snoring 02/25/2022   Acute bilateral low back pain without sciatica 01/29/2022   Microscopic hematuria 01/29/2022   Abnormal urine 01/29/2022   History of stroke 24/26/8341   Umbilical hernia 96/22/2979   Frequent stools 01/04/2020   Pre-operative general physical examination 07/06/2019   Lower abdominal pain 08/16/2018   Stress reaction 09/05/2017   Estrogen deficiency 05/25/2016   Routine general medical examination at a health care facility 11/23/2015   Hammertoe 10/28/2015   Frequent loose stools 02/14/2015   Diverticulosis of colon without hemorrhage 02/14/2015   Colon cancer screening 08/19/2014   Abdominal pain, left lower quadrant 03/18/2014   Essential hypertension 02/25/2014   Encounter for Medicare annual wellness exam 07/16/2013   Prediabetes 05/14/2009   Rheumatoid arthritis with positive rheumatoid factor ([place]) 05/13/2008   Osteopenia 03/07/2007   Hyperlipidemia 01/03/2007   Obesity (BMI 30-39.9) 01/03/2007   ALLERGIC RHINITIS, SEASONAL 01/03/2007   ASTHMA 01/03/2007   OVERACTIVE BLADDER 01/03/2007    ONSET DATE: 12/31/21  REFERRING DIAG: G92.11 (ICD-10-CM) - Personal history of transient ischemic attack (TIA), and cerebral infarction without  residual deficits  THERAPY DIAG:  Muscle weakness (generalized)  Abnormality of gait and mobility  Difficulty in walking, not elsewhere classified  Other lack of coordination  Rationale for Evaluation and Treatment Rehabilitation  SUBJECTIVE:                                                                                                                                                          SUBJECTIVE STATEMENT: Pt reports she had a good warm up when walking in today.  Pt reports no headache this date.                                       PERTINENT HISTORY: Pt reports he biggest concern is her balance at the moment. She reports she is not dizzy  necessarily but she is off. Pt reports no falls in the last 6 months and she does not utilize an AD at this time. Pt has had PT in the past but balance was not the main focus, pt also has had PT for her R TKA.  With her balance pt feels when she gets up to walk she feels like she needs to hold onto something. Pt has 20 grandchildren who range from 2 years to 36 years and is also expecting a new great grandchild soon. Pt also enjoys travelling with her husband and tries to exercise for 20 minutes per day with walking or other light activities.   PAIN:  Are you having pain? No  PRECAUTIONS: Fall  WEIGHT BEARING RESTRICTIONS No  FALLS: Has patient fallen in last 6 months? No  LIVING ENVIRONMENT: Lives with: lives with their family and lives with their spouse Lives in: House/apartment Stairs: Yes: External: 3 steps; on right going up Has following equipment at home: Single point cane and Walker - 2 wheeled  PLOF: Independent and Independent with basic ADLs  PATIENT GOALS: Improve strength, balance, and confidence with activities  OBJECTIVE:   LOWER EXTREMITY MMT:    MMT Right Eval Left Eval  Hip flexion 4 4+  Hip extension    Hip abduction 4+ 4+  Hip adduction 5 5  Hip internal rotation    Hip external rotation    Knee flexion 4 4+  Knee extension 4 4+  Ankle dorsiflexion 5 5  Ankle plantarflexion 5 5  Ankle inversion    Ankle eversion    (Blank rows = not tested)   TODAY'S TREATMENT:  03/11/22     TE:   Nustep Lvl 3 seat position 8 RPM> 50 x4 minute   Standing 4# AW step taps anterior x 10 ea LE and lateral x 10 ea LE without UE support, some hesitancy to perform     Seated with #  4 ankle weights seated on dynadisc for core stabilization for all below seated exercise  -Seated marches with upright posture, back away from back of chair for abdominal/trunk activation/stabilization, 10x each LE -Seated LAQ with 3 second holds, 10x each LE, used adduction squeeze to  improve Vastus medialis recruitment and this reduced knee pain/ discomfort with this activity  -Seated IR/ER with ball between knees for stabilization and adductor recruitment  10x each LE  GTB abduction 20x with 3 second holds  NMR:   Ambulating obstacle course with sharp turns, stepping over objects, and stepping on / off airex x 3 laps each direction.     Airex pad: reach and play word game with dual task of reaching x3 minutes, good balance with self selected stance     PATIENT EDUCATION: Education details: Pt educated throughout session about proper posture and technique with exercises. Improved exercise technique, movement at target joints, use of target muscles after min to mod verbal, visual, tactile cues.  Person educated: Patient Education method: Explanation Education comprehension: verbalized understanding   HOME EXERCISE PROGRAM: Access Code: [email or web address] URL: [email or web address] Date: 02/04/2022 Prepared by: Rivka Barbara  Exercises - Sit to Stand  - 1 x daily - 7 x weekly - 2 sets - 8 reps - Standing March with Counter Support  - 1 x daily - 7 x weekly - 2 sets - 10 reps - Side Stepping with Counter Support  - 1 x daily - 7 x weekly - 2 sets - 10 reps - Standing in "bowling stance" with counter support   - 1 x daily - 7 x weekly - 2 reps - 30 seocnd hold    GOALS: Goals reviewed with patient? Yes  SHORT TERM GOALS: Target date: 02/24/2022  Patient will be independent in home exercise program to improve strength/mobility for better functional independence with ADLs. Baseline: Goal status: INITIAL    LONG TERM GOALS: Target date: 05/12/2022  Patient will increase FOTO score to equal to or greater than  68   to demonstrate statistically significant improvement in mobility and quality of life.  Baseline: 63 Goal status: INITIAL  2.  Patient (> 36 years old) will complete five times sit to stand test in < 15 seconds indicating an increased LE  strength and improved balance. Baseline: 17.3 s Goal status: INITIAL  3.  Patient will increase Berg Balance score by > 6 points to demonstrate decreased fall risk during functional activities. Baseline: 45 Goal status: INITIAL  4.   Patient will reduce timed up and go to <11 seconds to reduce fall risk and demonstrate improved transfer/gait ability. Baseline: 11.87 Goal status: INITIAL  5.  Patient will increase BLE gross strength to 4+/5 as to improve functional strength for independent gait, increased standing tolerance and increased ADL ability. Baseline: see eval, R side weaker than left grossly  Goal status: INITIAL    ASSESSMENT:  CLINICAL IMPRESSION: Continued with current plan of care as laid out in evaluation and recent prior sessions. Pt remains motivated to advance progress toward goals in order to maximize independence and safety at home. Pt requires high level assistance and cuing for completion of exercises in order to provide adequate level of stimulation and perturbation. Pt continues to demonstrate progress toward goals AEB progression of some interventions this date either in volume or intensity.       OBJECTIVE IMPAIRMENTS decreased activity tolerance, decreased balance, and decreased strength.   ACTIVITY LIMITATIONS bending, squatting, and stairs  PARTICIPATION LIMITATIONS: cleaning, laundry,  community activity, and activities going to events for her grandchildren   PERSONAL FACTORS Age and 1-2 comorbidities: HLD, HTN   are also affecting patient's functional outcome.   REHAB POTENTIAL: Excellent  CLINICAL DECISION MAKING: Stable/uncomplicated  EVALUATION COMPLEXITY: Low  PLAN: PT FREQUENCY: 2x/week  PT DURATION: 12 weeks  PLANNED INTERVENTIONS: Therapeutic exercises, Therapeutic activity, Neuromuscular re-education, Balance training, Gait training, Patient/Family education, Joint mobilization, and Stair training  PLAN FOR NEXT SESSION: continue  POC     Particia Lather, PT 03/11/2022, 11:50 AM

## 2022-03-16 ENCOUNTER — Ambulatory Visit: Payer: Medicare HMO | Admitting: Speech Pathology

## 2022-03-16 ENCOUNTER — Encounter: Payer: Medicare HMO | Admitting: Occupational Therapy

## 2022-03-16 ENCOUNTER — Ambulatory Visit: Payer: Medicare HMO | Admitting: Physical Therapy

## 2022-03-17 NOTE — Therapy (Signed)
OUTPATIENT PHYSICAL THERAPY TREATMENT   Patient Name: Cheryl Joseph MRN: [id number] DOB:July 23, 1941, 81 y.o., female Today's Date: 03/18/2022   PCP:   Abner Greenspan, MD   REFERRING PROVIDER: Vladimir Crofts, MD   PT End of Session - 03/18/22 0756     Visit Number 13    Number of Visits 24    Date for PT Re-Evaluation 04/21/22    Authorization Type [employer]    Authorization Time Period 01/27/22-04/21/22    Progress Note Due on Visit 10    PT Start Time 0800    PT Stop Time 0844    PT Time Calculation (min) 44 min    Equipment Utilized During Treatment Gait belt    Activity Tolerance Patient tolerated treatment well;No increased pain    Behavior During Therapy WFL for tasks assessed/performed                       Past Medical History:  Diagnosis Date   Anginal pain ([place])    Arthritis    RA hands(seronegative)   Asthma    Back pain    Cancer ([facility])    skin   Cataract    HPV in female 1996   HPV with colposcopy (all neg paps since)   Hyperlipidemia    Hypertension    Incarcerated ventral hernia    Obesity    Osteopenia    Pre-diabetes    Shortness of breath dyspnea    Shoulder pain    Past Surgical History:  Procedure Laterality Date   CATARACT EXTRACTION, BILATERAL Bilateral    COLONOSCOPY  06/01/2004   COLONOSCOPY WITH PROPOFOL N/A 05/01/2015   Procedure: COLONOSCOPY WITH PROPOFOL;  Surgeon: Manya Silvas, MD;  Location: [facility];  Service: Endoscopy;  Laterality: N/A;   CORRECTION HAMMER TOE Right    2nd toe   JOINT REPLACEMENT Right    right knee replacement January 2021   SQUAMOUS CELL CARCINOMA EXCISION Left 12/2017   left arm   TONSILLECTOMY     VAGINAL HYSTERECTOMY  2015   with pelvic organ prolapse surgery    XI ROBOTIC ASSISTED VENTRAL HERNIA N/A 06/10/2021   Procedure: XI ROBOTIC ASSISTED VENTRAL HERNIA;  Surgeon: Herbert Pun, MD;  Location: [facility];  Service: General;  Laterality: N/A;   Patient  Active Problem List   Diagnosis Date Noted   Chronic headache 02/25/2022   Snoring 02/25/2022   Acute bilateral low back pain without sciatica 01/29/2022   Microscopic hematuria 01/29/2022   Abnormal urine 01/29/2022   History of stroke 75/64/3329   Umbilical hernia 51/88/4166   Frequent stools 01/04/2020   Pre-operative general physical examination 07/06/2019   Lower abdominal pain 08/16/2018   Stress reaction 09/05/2017   Estrogen deficiency 05/25/2016   Routine general medical examination at a health care facility 11/23/2015   Hammertoe 10/28/2015   Frequent loose stools 02/14/2015   Diverticulosis of colon without hemorrhage 02/14/2015   Colon cancer screening 08/19/2014   Abdominal pain, left lower quadrant 03/18/2014   Essential hypertension 02/25/2014   Encounter for Medicare annual wellness exam 07/16/2013   Prediabetes 05/14/2009   Rheumatoid arthritis with positive rheumatoid factor ([place]) 05/13/2008   Osteopenia 03/07/2007   Hyperlipidemia 01/03/2007   Obesity (BMI 30-39.9) 01/03/2007   ALLERGIC RHINITIS, SEASONAL 01/03/2007   ASTHMA 01/03/2007   OVERACTIVE BLADDER 01/03/2007    ONSET DATE: 12/31/21  REFERRING DIAG: A63.01 (ICD-10-CM) - Personal history of transient ischemic attack (TIA), and cerebral infarction  without residual deficits  THERAPY DIAG:  Muscle weakness (generalized)  Abnormality of gait and mobility  Difficulty in walking, not elsewhere classified  Rationale for Evaluation and Treatment Rehabilitation  SUBJECTIVE:                                                                                                                                                          SUBJECTIVE STATEMENT: Patient reports she has been staying busy. No complaints at this time.                                       PERTINENT HISTORY: Pt reports he biggest concern is her balance at the moment. She reports she is not dizzy necessarily but she is off. Pt reports no  falls in the last 6 months and she does not utilize an AD at this time. Pt has had PT in the past but balance was not the main focus, pt also has had PT for her R TKA.  With her balance pt feels when she gets up to walk she feels like she needs to hold onto something. Pt has 20 grandchildren who range from 2 years to 30 years and is also expecting a new great grandchild soon. Pt also enjoys travelling with her husband and tries to exercise for 20 minutes per day with walking or other light activities.   PAIN:  Are you having pain? No  PRECAUTIONS: Fall  WEIGHT BEARING RESTRICTIONS No  FALLS: Has patient fallen in last 6 months? No  LIVING ENVIRONMENT: Lives with: lives with their family and lives with their spouse Lives in: House/apartment Stairs: Yes: External: 3 steps; on right going up Has following equipment at home: Single point cane and Walker - 2 wheeled  PLOF: Independent and Independent with basic ADLs  PATIENT GOALS: Improve strength, balance, and confidence with activities  OBJECTIVE:   LOWER EXTREMITY MMT:    MMT Right Eval Left Eval  Hip flexion 4 4+  Hip extension    Hip abduction 4+ 4+  Hip adduction 5 5  Hip internal rotation    Hip external rotation    Knee flexion 4 4+  Knee extension 4 4+  Ankle dorsiflexion 5 5  Ankle plantarflexion 5 5  Ankle inversion    Ankle eversion    (Blank rows = not tested)   TODAY'S TREATMENT:  03/18/22     TE:   Nustep Lvl 4 seat position 8 RPM> 50 x4 minute   PreCore Leg press BLE #55 10x; 2 sets  4" step: step up/down 10x each LE ; no UE support required   Seated with # 4 ankle weights   -Seated marches with upright posture, back away from back  of chair for abdominal/trunk activation/stabilization, 12x each LE -Seated LAQ with 3 second holds, 10x each LE, used adduction squeeze to improve Vastus medialis recruitment and this reduced knee pain/ discomfort with this activity  -Seated IR/ER   10x each LE -heel  raise 20x  GTB abduction 10x with 5 second pulse   NMR:    Bosu:    -large modified forward lunge 10x each LE with SUE support   -lateral modified lunge 10x each LE with UE support * terminated on LLE die tp [aom/    PATIENT EDUCATION: Education details: Pt educated throughout session about proper posture and technique with exercises. Improved exercise technique, movement at target joints, use of target muscles after min to mod verbal, visual, tactile cues.  Person educated: Patient Education method: Explanation Education comprehension: verbalized understanding   HOME EXERCISE PROGRAM: Access Code: [email or web address] URL: [email or web address] Date: 02/04/2022 Prepared by: Rivka Barbara  Exercises - Sit to Stand  - 1 x daily - 7 x weekly - 2 sets - 8 reps - Standing March with Counter Support  - 1 x daily - 7 x weekly - 2 sets - 10 reps - Side Stepping with Counter Support  - 1 x daily - 7 x weekly - 2 sets - 10 reps - Standing in "bowling stance" with counter support   - 1 x daily - 7 x weekly - 2 reps - 30 seocnd hold  Access Code: [email or web address] URL: [email or web address] Date: 03/18/2022 Prepared by: Janna Arch  Exercises - Leg Extension  - 1 x daily - 7 x weekly - 2 sets - 10 reps - 5 hold - Step Up  - 1 x daily - 7 x weekly - 2 sets - 10 reps - 5 hold - Lateral Step Ups  - 1 x daily - 7 x weekly - 2 sets - 10 reps - 5 hold   GOALS: Goals reviewed with patient? Yes  SHORT TERM GOALS: Target date: 02/24/2022  Patient will be independent in home exercise program to improve strength/mobility for better functional independence with ADLs. Baseline: Goal status: INITIAL    LONG TERM GOALS: Target date: 05/12/2022  Patient will increase FOTO score to equal to or greater than  68   to demonstrate statistically significant improvement in mobility and quality of life.  Baseline: 63 Goal status: INITIAL  2.  Patient (> 30 years old) will complete  five times sit to stand test in < 15 seconds indicating an increased LE strength and improved balance. Baseline: 17.3 s Goal status: INITIAL  3.  Patient will increase Berg Balance score by > 6 points to demonstrate decreased fall risk during functional activities. Baseline: 45 Goal status: INITIAL  4.   Patient will reduce timed up and go to <11 seconds to reduce fall risk and demonstrate improved transfer/gait ability. Baseline: 11.87 Goal status: INITIAL  5.  Patient will increase BLE gross strength to 4+/5 as to improve functional strength for independent gait, increased standing tolerance and increased ADL ability. Baseline: see eval, R side weaker than left grossly  Goal status: INITIAL    ASSESSMENT:  CLINICAL IMPRESSION: Patient has intermittent L knee pain with interventions requiring close monitoring. She does tolerate step ups without pain without needing to use UE support indicating improved mobility.  Strengthening will continue to be an area of focus for patient. Pt continues to demonstrate progress toward goals AEB progression of some interventions this date either in volume or intensity.  OBJECTIVE IMPAIRMENTS decreased activity tolerance, decreased balance, and decreased strength.   ACTIVITY LIMITATIONS bending, squatting, and stairs  PARTICIPATION LIMITATIONS: cleaning, laundry, community activity, and activities going to events for her grandchildren   PERSONAL FACTORS Age and 1-2 comorbidities: HLD, HTN   are also affecting patient's functional outcome.   REHAB POTENTIAL: Excellent  CLINICAL DECISION MAKING: Stable/uncomplicated  EVALUATION COMPLEXITY: Low  PLAN: PT FREQUENCY: 2x/week  PT DURATION: 12 weeks  PLANNED INTERVENTIONS: Therapeutic exercises, Therapeutic activity, Neuromuscular re-education, Balance training, Gait training, Patient/Family education, Joint mobilization, and Stair training  PLAN FOR NEXT SESSION: continue POC      Janna Arch, PT 03/18/2022, 8:44 AM

## 2022-03-18 ENCOUNTER — Encounter: Payer: Medicare HMO | Admitting: Occupational Therapy

## 2022-03-18 ENCOUNTER — Ambulatory Visit: Payer: Medicare HMO

## 2022-03-18 ENCOUNTER — Encounter: Payer: Medicare HMO | Admitting: Speech Pathology

## 2022-03-18 DIAGNOSIS — M6281 Muscle weakness (generalized): Secondary | ICD-10-CM | POA: Diagnosis not present

## 2022-03-18 DIAGNOSIS — R41841 Cognitive communication deficit: Secondary | ICD-10-CM | POA: Diagnosis not present

## 2022-03-18 DIAGNOSIS — R269 Unspecified abnormalities of gait and mobility: Secondary | ICD-10-CM

## 2022-03-18 DIAGNOSIS — R278 Other lack of coordination: Secondary | ICD-10-CM | POA: Diagnosis not present

## 2022-03-18 DIAGNOSIS — R262 Difficulty in walking, not elsewhere classified: Secondary | ICD-10-CM | POA: Diagnosis not present

## 2022-03-23 ENCOUNTER — Ambulatory Visit: Payer: Medicare HMO

## 2022-03-23 ENCOUNTER — Encounter: Payer: Medicare HMO | Admitting: Speech Pathology

## 2022-03-25 ENCOUNTER — Ambulatory Visit: Payer: Medicare HMO

## 2022-03-25 ENCOUNTER — Telehealth: Payer: Self-pay | Admitting: Family Medicine

## 2022-03-25 ENCOUNTER — Encounter: Payer: Medicare HMO | Admitting: Speech Pathology

## 2022-03-25 ENCOUNTER — Encounter: Payer: Medicare HMO | Admitting: Occupational Therapy

## 2022-03-25 DIAGNOSIS — M6281 Muscle weakness (generalized): Secondary | ICD-10-CM

## 2022-03-25 DIAGNOSIS — R269 Unspecified abnormalities of gait and mobility: Secondary | ICD-10-CM

## 2022-03-25 DIAGNOSIS — R262 Difficulty in walking, not elsewhere classified: Secondary | ICD-10-CM

## 2022-03-25 DIAGNOSIS — R278 Other lack of coordination: Secondary | ICD-10-CM | POA: Diagnosis not present

## 2022-03-25 DIAGNOSIS — R41841 Cognitive communication deficit: Secondary | ICD-10-CM | POA: Diagnosis not present

## 2022-03-25 NOTE — Telephone Encounter (Signed)
You are right! I'm sorry, we did that after the last labs.  It must be zetia that also gave her the headache.  Please stop the px - tell her to stay off of it.  If she is open to it I can send her to a lipid specialist in cardiology to discuss what other options we have  Thanks!

## 2022-03-25 NOTE — Telephone Encounter (Signed)
Stop it and take off med list please.  I would like to try zetia (non statin so hope it would not cause any side eff) If agreeable send in zetia 10 mg 1 po qd #90 1 refill   Then re check fasting lipids in about a month  Keep watching diet

## 2022-03-25 NOTE — Therapy (Signed)
OUTPATIENT PHYSICAL THERAPY TREATMENT   Patient Name: Cheryl Joseph MRN: [id number] DOB:[birth year], 81 y.o., female Today's Date: 03/25/2022   PCP:   Abner Greenspan, MD   REFERRING PROVIDER: Vladimir Crofts, MD   PT End of Session - 03/25/22 1433     Visit Number 14    Number of Visits 24    Date for PT Re-Evaluation 04/21/22    Authorization Type [employer]    Authorization Time Period 01/27/22-04/21/22    Progress Note Due on Visit 10    PT Start Time 1430    PT Stop Time 1514    PT Time Calculation (min) 44 min    Equipment Utilized During Treatment Gait belt    Activity Tolerance Patient tolerated treatment well;No increased pain    Behavior During Therapy WFL for tasks assessed/performed                        Past Medical History:  Diagnosis Date   Anginal pain ([place])    Arthritis    RA hands(seronegative)   Asthma    Back pain    Cancer (HCC)    skin   Cataract    HPV in female 1996   HPV with colposcopy (all neg paps since)   Hyperlipidemia    Hypertension    Incarcerated ventral hernia    Obesity    Osteopenia    Pre-diabetes    Shortness of breath dyspnea    Shoulder pain    Past Surgical History:  Procedure Laterality Date   CATARACT EXTRACTION, BILATERAL Bilateral    COLONOSCOPY  06/01/2004   COLONOSCOPY WITH PROPOFOL N/A 05/01/2015   Procedure: COLONOSCOPY WITH PROPOFOL;  Surgeon: Manya Silvas, MD;  Location: [facility];  Service: Endoscopy;  Laterality: N/A;   CORRECTION HAMMER TOE Right    2nd toe   JOINT REPLACEMENT Right    right knee replacement January 2021   SQUAMOUS CELL CARCINOMA EXCISION Left 12/2017   left arm   TONSILLECTOMY     VAGINAL HYSTERECTOMY  2015   with pelvic organ prolapse surgery    XI ROBOTIC ASSISTED VENTRAL HERNIA N/A 06/10/2021   Procedure: XI ROBOTIC ASSISTED VENTRAL HERNIA;  Surgeon: Herbert Pun, MD;  Location: [facility];  Service: General;  Laterality: N/A;   Patient  Active Problem List   Diagnosis Date Noted   Chronic headache 02/25/2022   Snoring 02/25/2022   Acute bilateral low back pain without sciatica 01/29/2022   Microscopic hematuria 01/29/2022   Abnormal urine 01/29/2022   History of stroke 26/71/2458   Umbilical hernia 09/98/3382   Frequent stools 01/04/2020   Pre-operative general physical examination 07/06/2019   Lower abdominal pain 08/16/2018   Stress reaction 09/05/2017   Estrogen deficiency 05/25/2016   Routine general medical examination at a health care facility 11/23/2015   Hammertoe 10/28/2015   Frequent loose stools 02/14/2015   Diverticulosis of colon without hemorrhage 02/14/2015   Colon cancer screening 08/19/2014   Abdominal pain, left lower quadrant 03/18/2014   Essential hypertension 02/25/2014   Encounter for Medicare annual wellness exam 07/16/2013   Prediabetes 05/14/2009   Rheumatoid arthritis with positive rheumatoid factor ([place]) 05/13/2008   Osteopenia 03/07/2007   Hyperlipidemia 01/03/2007   Obesity (BMI 30-39.9) 01/03/2007   ALLERGIC RHINITIS, SEASONAL 01/03/2007   ASTHMA 01/03/2007   OVERACTIVE BLADDER 01/03/2007    ONSET DATE: 12/31/21  REFERRING DIAG: N05.39 (ICD-10-CM) - Personal history of transient ischemic attack (TIA), and cerebral  infarction without residual deficits  THERAPY DIAG:  Muscle weakness (generalized)  Abnormality of gait and mobility  Difficulty in walking, not elsewhere classified  Rationale for Evaluation and Treatment Rehabilitation  SUBJECTIVE:                                                                                                                                                          SUBJECTIVE STATEMENT: Patient reports she is feeling more stable. Continue to have headaches.                                       PERTINENT HISTORY: Pt reports he biggest concern is her balance at the moment. She reports she is not dizzy necessarily but she is off. Pt reports no  falls in the last 6 months and she does not utilize an AD at this time. Pt has had PT in the past but balance was not the main focus, pt also has had PT for her R TKA.  With her balance pt feels when she gets up to walk she feels like she needs to hold onto something. Pt has 20 grandchildren who range from 2 years to 39 years and is also expecting a new great grandchild soon. Pt also enjoys travelling with her husband and tries to exercise for 20 minutes per day with walking or other light activities.   PAIN:  Are you having pain? No  PRECAUTIONS: Fall  WEIGHT BEARING RESTRICTIONS No  FALLS: Has patient fallen in last 6 months? No  LIVING ENVIRONMENT: Lives with: lives with their family and lives with their spouse Lives in: House/apartment Stairs: Yes: External: 3 steps; on right going up Has following equipment at home: Single point cane and Walker - 2 wheeled  PLOF: Independent and Independent with basic ADLs  PATIENT GOALS: Improve strength, balance, and confidence with activities  OBJECTIVE:   LOWER EXTREMITY MMT:    MMT Right Eval Left Eval  Hip flexion 4 4+  Hip extension    Hip abduction 4+ 4+  Hip adduction 5 5  Hip internal rotation    Hip external rotation    Knee flexion 4 4+  Knee extension 4 4+  Ankle dorsiflexion 5 5  Ankle plantarflexion 5 5  Ankle inversion    Ankle eversion    (Blank rows = not tested)   TODAY'S TREATMENT:  03/25/22     TE:   Nustep Lvl 4 seat position 6 RPM> 50 x4 minute   PreCore Leg press BLE #55 10x; 2 sets  PreCore leg press Single limb #25 10x each LE ; more challenging with LLE     Squat 10x with UE support cue for posterior weight shift  Seated with #  4 ankle weights   -Seated marches with upright posture, back away from back of chair for abdominal/trunk activation/stabilization, 12x each LE -Seated LAQ with 3 second holds, 10x each LE, used adduction squeeze to improve Vastus medialis recruitment and this reduced  knee pain/ discomfort with this activity  -Seated IR/ER   10x each LE -heel raise 20x  Seated adduction ball squeeze 10x 5 second Seated LAQ with adduction ball squeeze 10x   NMR:    Ambulate with varying gait speeds based on PT guidance x4 minutes   Ambulate while dual task naming animals in alphabetical order x 6 minutes        PATIENT EDUCATION: Education details: Pt educated throughout session about proper posture and technique with exercises. Improved exercise technique, movement at target joints, use of target muscles after min to mod verbal, visual, tactile cues.  Person educated: Patient Education method: Explanation Education comprehension: verbalized understanding   HOME EXERCISE PROGRAM: Access Code: [email or web address] URL: [email or web address] Date: 02/04/2022 Prepared by: Rivka Barbara  Exercises - Sit to Stand  - 1 x daily - 7 x weekly - 2 sets - 8 reps - Standing March with Counter Support  - 1 x daily - 7 x weekly - 2 sets - 10 reps - Side Stepping with Counter Support  - 1 x daily - 7 x weekly - 2 sets - 10 reps - Standing in "bowling stance" with counter support   - 1 x daily - 7 x weekly - 2 reps - 30 seocnd hold  Access Code: [email or web address] URL: [email or web address] Date: 03/18/2022 Prepared by: Janna Arch  Exercises - Leg Extension  - 1 x daily - 7 x weekly - 2 sets - 10 reps - 5 hold - Step Up  - 1 x daily - 7 x weekly - 2 sets - 10 reps - 5 hold - Lateral Step Ups  - 1 x daily - 7 x weekly - 2 sets - 10 reps - 5 hold   GOALS: Goals reviewed with patient? Yes  SHORT TERM GOALS: Target date: 02/24/2022  Patient will be independent in home exercise program to improve strength/mobility for better functional independence with ADLs. Baseline: Goal status: INITIAL    LONG TERM GOALS: Target date: 05/12/2022  Patient will increase FOTO score to equal to or greater than  68   to demonstrate statistically significant improvement  in mobility and quality of life.  Baseline: 63 Goal status: INITIAL  2.  Patient (> 51 years old) will complete five times sit to stand test in < 15 seconds indicating an increased LE strength and improved balance. Baseline: 17.3 s Goal status: INITIAL  3.  Patient will increase Berg Balance score by > 6 points to demonstrate decreased fall risk during functional activities. Baseline: 45 Goal status: INITIAL  4.   Patient will reduce timed up and go to <11 seconds to reduce fall risk and demonstrate improved transfer/gait ability. Baseline: 11.87 Goal status: INITIAL  5.  Patient will increase BLE gross strength to 4+/5 as to improve functional strength for independent gait, increased standing tolerance and increased ADL ability. Baseline: see eval, R side weaker than left grossly  Goal status: INITIAL    ASSESSMENT:  CLINICAL IMPRESSION: Patient presents with excellent motivation. She is able to meet with physician next Friday for neurology. Dual task with ambulation is challenging for word finding. Patient tolerates squats this sessoin when encouraged for posterior weight shift Patient will continue to benefit from skilled physical therapy to  improve stability, mobility, and strength.        OBJECTIVE IMPAIRMENTS decreased activity tolerance, decreased balance, and decreased strength.   ACTIVITY LIMITATIONS bending, squatting, and stairs  PARTICIPATION LIMITATIONS: cleaning, laundry, community activity, and activities going to events for her grandchildren   PERSONAL FACTORS Age and 1-2 comorbidities: HLD, HTN   are also affecting patient's functional outcome.   REHAB POTENTIAL: Excellent  CLINICAL DECISION MAKING: Stable/uncomplicated  EVALUATION COMPLEXITY: Low  PLAN: PT FREQUENCY: 2x/week  PT DURATION: 12 weeks  PLANNED INTERVENTIONS: Therapeutic exercises, Therapeutic activity, Neuromuscular re-education, Balance training, Gait training, Patient/Family  education, Joint mobilization, and Stair training  PLAN FOR NEXT SESSION: continue POC     Janna Arch, PT 03/25/2022, 3:14 PM

## 2022-03-25 NOTE — Telephone Encounter (Signed)
Pt called & stated she was put on new cholesterol meds & her headaches are back. Wanted advice on what to do about headaches. Call back # [phone]

## 2022-03-25 NOTE — Telephone Encounter (Signed)
Left VM requesting pt to call the office back 

## 2022-03-25 NOTE — Telephone Encounter (Signed)
Spoke with pt and advised of PCP's comments and scheduled 1 month f/u, went to send in [place] and it's already on med list, looks like it was sent in on 02/26/22?? If this if the Rx pt was saying caused HA

## 2022-03-26 ENCOUNTER — Telehealth (INDEPENDENT_AMBULATORY_CARE_PROVIDER_SITE_OTHER): Payer: Medicare HMO | Admitting: Family Medicine

## 2022-03-26 ENCOUNTER — Encounter: Payer: Self-pay | Admitting: Family Medicine

## 2022-03-26 DIAGNOSIS — E785 Hyperlipidemia, unspecified: Secondary | ICD-10-CM

## 2022-03-26 DIAGNOSIS — R519 Headache, unspecified: Secondary | ICD-10-CM

## 2022-03-26 DIAGNOSIS — G8929 Other chronic pain: Secondary | ICD-10-CM | POA: Diagnosis not present

## 2022-03-26 MED ORDER — SIMVASTATIN 40 MG PO TABS: 40.0000 mg | ORAL_TABLET | Freq: Every day | ORAL | 1 refills | Status: DC

## 2022-03-26 NOTE — Telephone Encounter (Signed)
Pt notified of Dr. Marliss Coots comments and appt scheduled

## 2022-03-26 NOTE — Progress Notes (Unsigned)
Virtual Visit via Telephone Note  I connected with Cheryl Joseph on 03/26/22 at  4:00 PM EDT by telephone and verified that I am speaking with the correct person using two identifiers.  Location: Patient: home Provider: office    I discussed the limitations, risks, security and privacy concerns of performing an evaluation and management service by telephone and the availability of in person appointments. I also discussed with the patient that there may be a patient responsible charge related to this service. The patient expressed understanding and agreed to proceed.  Parties involved in encounter  Patient: Cheryl Joseph  Provider:  Loura Pardon MD   History of Present Illness: Pt presents to discuss cholesterol   H/o cva and now goal for LDL is under 70  She has tried atorvastatin and rosuvastatin -had a bad headache from it  When she stopped the medicine her headache went away   Then tried zetia and that gave her a headache also   Lab Results  Component Value Date   CHOL 216 (H) 02/25/2022   HDL 43.00 02/25/2022   LDLCALC 88 01/01/2022   LDLDIRECT 135.0 02/25/2022   TRIG 271.0 (H) 02/25/2022   CHOLHDL 5 02/25/2022      Lab Results  Component Value Date   HGBA1C 6.4 02/25/2022   No headaches independent of cholesterol medicines   Patient Active Problem List   Diagnosis Date Noted   Chronic headache 02/25/2022   Snoring 02/25/2022   Acute bilateral low back pain without sciatica 01/29/2022   Microscopic hematuria 01/29/2022   Abnormal urine 01/29/2022   History of stroke 24/40/1027   Umbilical hernia 25/36/6440   Frequent stools 01/04/2020   Pre-operative general physical examination 07/06/2019   Lower abdominal pain 08/16/2018   Stress reaction 09/05/2017   Estrogen deficiency 05/25/2016   Routine general medical examination at a health care facility 11/23/2015   Hammertoe 10/28/2015   Frequent loose stools 02/14/2015   Diverticulosis of colon without  hemorrhage 02/14/2015   Colon cancer screening 08/19/2014   Abdominal pain, left lower quadrant 03/18/2014   Essential hypertension 02/25/2014   Encounter for Medicare annual wellness exam 07/16/2013   Prediabetes 05/14/2009   Rheumatoid arthritis with positive rheumatoid factor ([place]) 05/13/2008   Osteopenia 03/07/2007   Hyperlipidemia 01/03/2007   Obesity (BMI 30-39.9) 01/03/2007   ALLERGIC RHINITIS, SEASONAL 01/03/2007   ASTHMA 01/03/2007   OVERACTIVE BLADDER 01/03/2007   Past Medical History:  Diagnosis Date   Anginal pain (HCC)    Arthritis    RA hands(seronegative)   Asthma    Back pain    Cancer ([place])    skin   Cataract    HPV in female 1996   HPV with colposcopy (all neg paps since)   Hyperlipidemia    Hypertension    Incarcerated ventral hernia    Obesity    Osteopenia    Pre-diabetes    Shortness of breath dyspnea    Shoulder pain    Past Surgical History:  Procedure Laterality Date   CATARACT EXTRACTION, BILATERAL Bilateral    COLONOSCOPY  06/01/2004   COLONOSCOPY WITH PROPOFOL N/A 05/01/2015   Procedure: COLONOSCOPY WITH PROPOFOL;  Surgeon: Manya Silvas, MD;  Location: [facility] ENDOSCOPY;  Service: Endoscopy;  Laterality: N/A;   CORRECTION HAMMER TOE Right    2nd toe   JOINT REPLACEMENT Right    right knee replacement January 2021   SQUAMOUS CELL CARCINOMA EXCISION Left 12/2017   left arm   TONSILLECTOMY  VAGINAL HYSTERECTOMY  2015   with pelvic organ prolapse surgery    XI ROBOTIC ASSISTED VENTRAL HERNIA N/A 06/10/2021   Procedure: XI ROBOTIC ASSISTED VENTRAL HERNIA;  Surgeon: Herbert Pun, MD;  Location: [facility] ORS;  Service: General;  Laterality: N/A;   Social History   Tobacco Use   Smoking status: Never   Smokeless tobacco: Never  Vaping Use   Vaping Use: Never used  Substance Use Topics   Alcohol use: No    Alcohol/week: 0.0 standard drinks of alcohol   Drug use: No   Family History  Problem Relation Age of Onset   Diabetes  Mother    Hyperlipidemia Mother    Heart disease Mother    Hypertension Mother    Obesity Mother    Kyphosis Mother    Heart disease Father    Hypertension Father    Cancer Father        lung Cancer smoker   Diabetes Daughter    Kyphosis Sister    Breast cancer Neg Hx    Allergies  Allergen Reactions   Atorvastatin     Headache    Rosuvastatin     Headache    Tolterodine Tartrate Other (See Comments)    Unknown (back ache)   Vesicare [Solifenacin Succinate]     Eye problems    Zetia [Ezetimibe]     Headache    Current Outpatient Medications on File Prior to Visit  Medication Sig Dispense Refill   acetaminophen (TYLENOL) 500 MG tablet Take 1,000 mg by mouth every 6 (six) hours as needed for mild pain.     albuterol (VENTOLIN HFA) 108 (90 Base) MCG/ACT inhaler INHALE TWO PUFFS BY MOUTH EVERY 4 HOURS AS NEEDED FOR WHEEZING AND 2 PUFFS BEFORE EXERCISE OR EXPOSURE TO COLD AIR 18 each 2   amLODipine (NORVASC) 5 MG tablet Hold until followup with PCP. 90 tablet 3   aspirin EC 81 MG tablet Take 1 tablet (81 mg total) by mouth daily. Swallow whole.     b complex vitamins tablet Take 1 tablet by mouth every Monday, Wednesday, and Friday.     calcium carbonate (OS-CAL - DOSED IN MG OF ELEMENTAL CALCIUM) 1250 (500 Ca) MG tablet Take 2 tablets by mouth daily with breakfast.     Cholecalciferol (VITAMIN D3 PO) Take 2 tablets by mouth daily.     Hyaluronic Acid-Vitamin C (HYALURONIC ACID PO) Take 1 tablet by mouth every Monday, Wednesday, and Friday.     montelukast (SINGULAIR) 10 MG tablet Take 1 tablet (10 mg total) by mouth at bedtime. 90 tablet 3   Multiple Vitamin (MULTIVITAMIN) capsule Take 1 capsule by mouth daily. Reported on 12/18/2015     Omega-3 Fatty Acids (FISH OIL PO) Take 1 capsule by mouth daily. Reported on 12/18/2015     OVER THE COUNTER MEDICATION Take 2 capsules by mouth daily. BONE UP     oxybutynin (OXYTROL) 3.9 MG/24HR Place 1 patch onto the skin once a week. Sunday.   Home med.     Probiotic Product (PROBIOTIC DAILY PO) Take 1 tablet by mouth daily.     psyllium (METAMUCIL SMOOTH TEXTURE) 28 % packet Take 1 packet by mouth daily.     Pumpkin Seed-Soy Germ (AZO BLADDER CONTROL/GO-LESS PO) Take 1 capsule by mouth at bedtime.     TURMERIC PO Take 720 mg by mouth every Monday, Wednesday, and Friday.     Ubiquinol 100 MG CAPS Take 1 capsule by mouth every Monday, Wednesday, and Friday.  No current facility-administered medications on file prior to visit.   Review of Systems  Constitutional:  Negative for chills, fever and malaise/fatigue.  HENT:  Negative for congestion, ear pain, sinus pain and sore throat.   Eyes:  Negative for blurred vision, discharge and redness.  Respiratory:  Negative for cough, shortness of breath and stridor.   Cardiovascular:  Negative for chest pain, palpitations and leg swelling.  Gastrointestinal:  Negative for abdominal pain, diarrhea, nausea and vomiting.  Musculoskeletal:  Negative for myalgias.  Skin:  Negative for rash.  Neurological:  Positive for headaches. Negative for dizziness.  Psychiatric/Behavioral:  The patient is nervous/anxious.      Observations/Objective: Pt sounds well  Not distressed Pleasant and talkative Nl mood  Nl cognition/good historian  Assessment and Plan: Problem List Items Addressed This Visit       Other   Chronic headache    Pt tried zetia and this had side eff of headache like the atorvastatin and rosuvastatin  Now resolved       Hyperlipidemia - Primary    Goal of LDL under 70 Disc goals for lipids and reasons to control them Rev last labs with pt Rev low sat fat diet in detail Intol of atorvastatin and rosuvastatin- caused headache zetia also caused headache  She plans to try simvastatin next (took in the past)-did not get to goal but tolerated If she does not tolerate this may need to f/u with cardiology (dr Jerrye Beavers) to discuss other options like pcyk9 medication  She  will continue to update  If the simvastatin is tolerated will check lipids       Relevant Medications   simvastatin (ZOCOR) 40 MG tablet     Follow Up Instructions: Take care of yourself  Keep eating a low cholesterol diet  Avoid red meat/ fried foods/ egg yolks/ fatty breakfast meats/ butter, cheese and high fat dairy/ and shellfish   Try the simvastatin 40 mg again for cholesterol If you get a headache stop it  If it goes well we will check lipids again at the end of the month   If it does not work out I would like you to discuss with dr Clayborn Bigness  You may be a candidate for PCYK9 inhibitor medicine like Repatha    I discussed the assessment and treatment plan with the patient. The patient was provided an opportunity to ask questions and all were answered. The patient agreed with the plan and demonstrated an understanding of the instructions.   The patient was advised to call back or seek an in-person evaluation if the symptoms worsen or if the condition fails to improve as anticipated.  I provided 17 minutes of non-face-to-face time during this encounter.   Loura Pardon, MD

## 2022-03-26 NOTE — Patient Instructions (Signed)
Take care of yourself  Keep eating a low cholesterol diet  Avoid red meat/ fried foods/ egg yolks/ fatty breakfast meats/ butter, cheese and high fat dairy/ and shellfish   Try the simvastatin 40 mg again for cholesterol If you get a headache stop it  If it goes well we will check lipids again at the end of the month   If it does not work out I would like you to discuss with dr Clayborn Bigness  You may be a candidate for PCYK9 inhibitor medicine like Repatha

## 2022-03-26 NOTE — Telephone Encounter (Signed)
Please schedule a phone visit and I will call her.  I do not think we should return to the statin

## 2022-03-26 NOTE — Addendum Note (Signed)
Addended by: Tammi Sou on: 03/26/2022 10:09 AM   Modules accepted: Orders

## 2022-03-26 NOTE — Telephone Encounter (Signed)
Pt notified to stop med. Pt said she didn't take med yesterday and woke up today without a HA so she is sure it was the med.  Pt really confused on should she go to the lipid clinic or try an statin she has used before. **pt said she would like PCP to call her so she can get a better understanding on what is best for her** please call pt back at [phone]

## 2022-03-26 NOTE — Telephone Encounter (Signed)
Patient called in returning a call she received. Thank you!

## 2022-03-29 NOTE — Assessment & Plan Note (Signed)
Goal of LDL under 70 Disc goals for lipids and reasons to control them Rev last labs with pt Rev low sat fat diet in detail Intol of atorvastatin and rosuvastatin- caused headache zetia also caused headache  She plans to try simvastatin next (took in the past)-did not get to goal but tolerated If she does not tolerate this may need to f/u with cardiology (dr Jerrye Beavers) to discuss other options like pcyk9 medication  She will continue to update  If the simvastatin is tolerated will check lipids

## 2022-03-29 NOTE — Assessment & Plan Note (Signed)
Pt tried zetia and this had side eff of headache like the atorvastatin and rosuvastatin  Now resolved

## 2022-03-30 ENCOUNTER — Encounter: Payer: Self-pay | Admitting: Physical Therapy

## 2022-03-30 ENCOUNTER — Other Ambulatory Visit: Payer: Medicare HMO

## 2022-03-30 ENCOUNTER — Encounter: Payer: Medicare HMO | Admitting: Occupational Therapy

## 2022-03-30 ENCOUNTER — Ambulatory Visit: Payer: Medicare HMO | Attending: Neurology | Admitting: Physical Therapy

## 2022-03-30 DIAGNOSIS — R2681 Unsteadiness on feet: Secondary | ICD-10-CM | POA: Insufficient documentation

## 2022-03-30 DIAGNOSIS — R269 Unspecified abnormalities of gait and mobility: Secondary | ICD-10-CM | POA: Insufficient documentation

## 2022-03-30 DIAGNOSIS — R262 Difficulty in walking, not elsewhere classified: Secondary | ICD-10-CM | POA: Diagnosis not present

## 2022-03-30 DIAGNOSIS — M6281 Muscle weakness (generalized): Secondary | ICD-10-CM | POA: Diagnosis not present

## 2022-03-30 NOTE — Therapy (Signed)
OUTPATIENT PHYSICAL THERAPY TREATMENT   Patient Name: Cheryl Joseph MRN: [id number] DOB:Aug 25, 1940, 81 y.o., female Today's Date: 03/30/2022   PCP:   Abner Greenspan, MD   REFERRING PROVIDER: Vladimir Crofts, MD   PT End of Session - 03/30/22 1517     Visit Number 15    Number of Visits 24    Date for PT Re-Evaluation 04/21/22    Authorization Type [employer]    Authorization Time Period 01/27/22-04/21/22    Progress Note Due on Visit 10    PT Start Time 1518    PT Stop Time 1556    PT Time Calculation (min) 38 min    Equipment Utilized During Treatment Gait belt    Activity Tolerance Patient tolerated treatment well;No increased pain    Behavior During Therapy WFL for tasks assessed/performed                         Past Medical History:  Diagnosis Date   Anginal pain ([place])    Arthritis    RA hands(seronegative)   Asthma    Back pain    Cancer (HCC)    skin   Cataract    HPV in female 1996   HPV with colposcopy (all neg paps since)   Hyperlipidemia    Hypertension    Incarcerated ventral hernia    Obesity    Osteopenia    Pre-diabetes    Shortness of breath dyspnea    Shoulder pain    Past Surgical History:  Procedure Laterality Date   CATARACT EXTRACTION, BILATERAL Bilateral    COLONOSCOPY  06/01/2004   COLONOSCOPY WITH PROPOFOL N/A 05/01/2015   Procedure: COLONOSCOPY WITH PROPOFOL;  Surgeon: Manya Silvas, MD;  Location: [place];  Service: Endoscopy;  Laterality: N/A;   CORRECTION HAMMER TOE Right    2nd toe   JOINT REPLACEMENT Right    right knee replacement January 2021   SQUAMOUS CELL CARCINOMA EXCISION Left 12/2017   left arm   TONSILLECTOMY     VAGINAL HYSTERECTOMY  2015   with pelvic organ prolapse surgery    XI ROBOTIC ASSISTED VENTRAL HERNIA N/A 06/10/2021   Procedure: XI ROBOTIC ASSISTED VENTRAL HERNIA;  Surgeon: Herbert Pun, MD;  Location: [facility] ORS;  Service: General;  Laterality: N/A;   Patient  Active Problem List   Diagnosis Date Noted   Chronic headache 02/25/2022   Snoring 02/25/2022   Acute bilateral low back pain without sciatica 01/29/2022   Microscopic hematuria 01/29/2022   Abnormal urine 01/29/2022   History of stroke 40/98/1191   Umbilical hernia 47/82/9562   Frequent stools 01/04/2020   Pre-operative general physical examination 07/06/2019   Lower abdominal pain 08/16/2018   Stress reaction 09/05/2017   Estrogen deficiency 05/25/2016   Routine general medical examination at a health care facility 11/23/2015   Hammertoe 10/28/2015   Frequent loose stools 02/14/2015   Diverticulosis of colon without hemorrhage 02/14/2015   Colon cancer screening 08/19/2014   Abdominal pain, left lower quadrant 03/18/2014   Essential hypertension 02/25/2014   Encounter for Medicare annual wellness exam 07/16/2013   Prediabetes 05/14/2009   Rheumatoid arthritis with positive rheumatoid factor ([place]) 05/13/2008   Osteopenia 03/07/2007   Hyperlipidemia 01/03/2007   Obesity (BMI 30-39.9) 01/03/2007   ALLERGIC RHINITIS, SEASONAL 01/03/2007   ASTHMA 01/03/2007   OVERACTIVE BLADDER 01/03/2007    ONSET DATE: 12/31/21  REFERRING DIAG: Z30.86 (ICD-10-CM) - Personal history of transient ischemic attack (TIA), and  cerebral infarction without residual deficits  THERAPY DIAG:  Muscle weakness (generalized)  Abnormality of gait and mobility  Difficulty in walking, not elsewhere classified  Rationale for Evaluation and Treatment Rehabilitation  SUBJECTIVE:                                                                                                                                                          SUBJECTIVE STATEMENT: Patient reports she is feeling more stable. Continue to have headaches.                                       PERTINENT HISTORY: Pt reports he biggest concern is her balance at the moment. She reports she is not dizzy necessarily but she is off. Pt reports no  falls in the last 6 months and she does not utilize an AD at this time. Pt has had PT in the past but balance was not the main focus, pt also has had PT for her R TKA.  With her balance pt feels when she gets up to walk she feels like she needs to hold onto something. Pt has 20 grandchildren who range from 2 years to 31 years and is also expecting a new great grandchild soon. Pt also enjoys travelling with her husband and tries to exercise for 20 minutes per day with walking or other light activities.   PAIN:  Are you having pain? No  PRECAUTIONS: Fall  WEIGHT BEARING RESTRICTIONS No  FALLS: Has patient fallen in last 6 months? No  LIVING ENVIRONMENT: Lives with: lives with their family and lives with their spouse Lives in: House/apartment Stairs: Yes: External: 3 steps; on right going up Has following equipment at home: Single point cane and Walker - 2 wheeled  PLOF: Independent and Independent with basic ADLs  PATIENT GOALS: Improve strength, balance, and confidence with activities  OBJECTIVE:   LOWER EXTREMITY MMT:    MMT Right Eval Left Eval  Hip flexion 4 4+  Hip extension    Hip abduction 4+ 4+  Hip adduction 5 5  Hip internal rotation    Hip external rotation    Knee flexion 4 4+  Knee extension 4 4+  Ankle dorsiflexion 5 5  Ankle plantarflexion 5 5  Ankle inversion    Ankle eversion    (Blank rows = not tested)   TODAY'S TREATMENT:  03/30/22     TE:   Nustep interval training level 3-4 alternating on the minute for 6 minutes total     PreCore Leg press BLE #55 12x; 2 sets  PreCore leg press Single limb #25 10x each LE ; more challenging with LLE     Squat 10x with UE support cue for  posterior weight shift  Seated with # 4 ankle weights   -Seated marches with upright posture, back away from back of chair for abdominal/trunk activation/stabilization, 12x each LE -Seated LAQ with 3 second holds, 10x each LE, with hip adduction  -heel raise  20x  Seated heel raise with 1/2 foam roller to increase available ROM.   Seated adduction ball squeeze 10x 5 second  NMR:       Ambulate while dual task naming animals in alphabetical order x 6 minutes    -improved efficacy this session       PATIENT EDUCATION: Education details: Pt educated throughout session about proper posture and technique with exercises. Improved exercise technique, movement at target joints, use of target muscles after min to mod verbal, visual, tactile cues.  Person educated: Patient Education method: Explanation Education comprehension: verbalized understanding   HOME EXERCISE PROGRAM: Access Code: [email or web address] URL: [email or web address] Date: 02/04/2022 Prepared by: Rivka Barbara  Exercises - Sit to Stand  - 1 x daily - 7 x weekly - 2 sets - 8 reps - Standing March with Counter Support  - 1 x daily - 7 x weekly - 2 sets - 10 reps - Side Stepping with Counter Support  - 1 x daily - 7 x weekly - 2 sets - 10 reps - Standing in "bowling stance" with counter support   - 1 x daily - 7 x weekly - 2 reps - 30 seocnd hold  Access Code: [email or web address] URL: [email or web address] Date: 03/18/2022 Prepared by: Janna Arch  Exercises - Leg Extension  - 1 x daily - 7 x weekly - 2 sets - 10 reps - 5 hold - Step Up  - 1 x daily - 7 x weekly - 2 sets - 10 reps - 5 hold - Lateral Step Ups  - 1 x daily - 7 x weekly - 2 sets - 10 reps - 5 hold   GOALS: Goals reviewed with patient? Yes  SHORT TERM GOALS: Target date: 02/24/2022  Patient will be independent in home exercise program to improve strength/mobility for better functional independence with ADLs. Baseline: Goal status: INITIAL    LONG TERM GOALS: Target date: 05/12/2022  Patient will increase FOTO score to equal to or greater than  68   to demonstrate statistically significant improvement in mobility and quality of life.  Baseline: 63 Goal status: INITIAL  2.  Patient (>  58 years old) will complete five times sit to stand test in < 15 seconds indicating an increased LE strength and improved balance. Baseline: 17.3 s Goal status: INITIAL  3.  Patient will increase Berg Balance score by > 6 points to demonstrate decreased fall risk during functional activities. Baseline: 45 Goal status: INITIAL  4.   Patient will reduce timed up and go to <11 seconds to reduce fall risk and demonstrate improved transfer/gait ability. Baseline: 11.87 Goal status: INITIAL  5.  Patient will increase BLE gross strength to 4+/5 as to improve functional strength for independent gait, increased standing tolerance and increased ADL ability. Baseline: see eval, R side weaker than left grossly  Goal status: INITIAL    ASSESSMENT:  CLINICAL IMPRESSION: Patient presents with excellent motivation.  Patient demonstrates improved ability to perform dual task ambulating this session.  Patient also making progress with lower extremity strength as evidenced by increasing repetitions and resistance with several activities this session.  Patient left lower extremity also experiencing less pain as well as less crepitus with leg press machine indicating improved lower  extremity neuromuscular control.  Patient will continue to benefit from skilled physical therapy to improve her lower extremity strength, balance, endurance, and improve her quality of life.      OBJECTIVE IMPAIRMENTS decreased activity tolerance, decreased balance, and decreased strength.   ACTIVITY LIMITATIONS bending, squatting, and stairs  PARTICIPATION LIMITATIONS: cleaning, laundry, community activity, and activities going to events for her grandchildren   PERSONAL FACTORS Age and 1-2 comorbidities: HLD, HTN   are also affecting patient's functional outcome.   REHAB POTENTIAL: Excellent  CLINICAL DECISION MAKING: Stable/uncomplicated  EVALUATION COMPLEXITY: Low  PLAN: PT FREQUENCY: 2x/week  PT DURATION: 12  weeks  PLANNED INTERVENTIONS: Therapeutic exercises, Therapeutic activity, Neuromuscular re-education, Balance training, Gait training, Patient/Family education, Joint mobilization, and Stair training  PLAN FOR NEXT SESSION: continue POC     Particia Lather, PT 03/30/2022, 4:57 PM

## 2022-04-01 ENCOUNTER — Encounter: Payer: Medicare HMO | Admitting: Occupational Therapy

## 2022-04-01 ENCOUNTER — Encounter: Payer: Medicare HMO | Admitting: Speech Pathology

## 2022-04-02 DIAGNOSIS — Z8673 Personal history of transient ischemic attack (TIA), and cerebral infarction without residual deficits: Secondary | ICD-10-CM | POA: Diagnosis not present

## 2022-04-02 DIAGNOSIS — E785 Hyperlipidemia, unspecified: Secondary | ICD-10-CM | POA: Diagnosis not present

## 2022-04-02 DIAGNOSIS — R4189 Other symptoms and signs involving cognitive functions and awareness: Secondary | ICD-10-CM | POA: Diagnosis not present

## 2022-04-02 DIAGNOSIS — I10 Essential (primary) hypertension: Secondary | ICD-10-CM | POA: Diagnosis not present

## 2022-04-06 ENCOUNTER — Encounter: Payer: Medicare HMO | Admitting: Occupational Therapy

## 2022-04-06 ENCOUNTER — Ambulatory Visit: Payer: Medicare HMO

## 2022-04-06 DIAGNOSIS — R269 Unspecified abnormalities of gait and mobility: Secondary | ICD-10-CM | POA: Diagnosis not present

## 2022-04-06 DIAGNOSIS — M6281 Muscle weakness (generalized): Secondary | ICD-10-CM | POA: Diagnosis not present

## 2022-04-06 DIAGNOSIS — R262 Difficulty in walking, not elsewhere classified: Secondary | ICD-10-CM | POA: Diagnosis not present

## 2022-04-06 DIAGNOSIS — R2681 Unsteadiness on feet: Secondary | ICD-10-CM | POA: Diagnosis not present

## 2022-04-06 NOTE — Therapy (Signed)
OUTPATIENT PHYSICAL THERAPY TREATMENT   Patient Name: Cheryl Joseph MRN: [id number] DOB:11-08-1940, 81 y.o., female Today's Date: 04/06/2022   PCP:   Abner Greenspan, MD   REFERRING PROVIDER: Vladimir Crofts, MD   PT End of Session - 04/06/22 1612     Visit Number 16    Number of Visits 24    Date for PT Re-Evaluation 04/21/22    Authorization Type [employer]    Authorization Time Period 01/27/22-04/21/22    Progress Note Due on Visit 10    PT Start Time 1607    PT Stop Time 1648    PT Time Calculation (min) 41 min    Equipment Utilized During Treatment Gait belt    Activity Tolerance Patient tolerated treatment well;No increased pain    Behavior During Therapy WFL for tasks assessed/performed                          Past Medical History:  Diagnosis Date   Anginal pain ([place])    Arthritis    RA hands(seronegative)   Asthma    Back pain    Cancer ([facility])    skin   Cataract    HPV in female 1996   HPV with colposcopy (all neg paps since)   Hyperlipidemia    Hypertension    Incarcerated ventral hernia    Obesity    Osteopenia    Pre-diabetes    Shortness of breath dyspnea    Shoulder pain    Past Surgical History:  Procedure Laterality Date   CATARACT EXTRACTION, BILATERAL Bilateral    COLONOSCOPY  06/01/2004   COLONOSCOPY WITH PROPOFOL N/A 05/01/2015   Procedure: COLONOSCOPY WITH PROPOFOL;  Surgeon: Manya Silvas, MD;  Location: [facility];  Service: Endoscopy;  Laterality: N/A;   CORRECTION HAMMER TOE Right    2nd toe   JOINT REPLACEMENT Right    right knee replacement January 2021   SQUAMOUS CELL CARCINOMA EXCISION Left 12/2017   left arm   TONSILLECTOMY     VAGINAL HYSTERECTOMY  2015   with pelvic organ prolapse surgery    XI ROBOTIC ASSISTED VENTRAL HERNIA N/A 06/10/2021   Procedure: XI ROBOTIC ASSISTED VENTRAL HERNIA;  Surgeon: Herbert Pun, MD;  Location: [facility];  Service: General;  Laterality: N/A;    Patient Active Problem List   Diagnosis Date Noted   Chronic headache 02/25/2022   Snoring 02/25/2022   Acute bilateral low back pain without sciatica 01/29/2022   Microscopic hematuria 01/29/2022   Abnormal urine 01/29/2022   History of stroke 02/58/5277   Umbilical hernia 82/42/3536   Frequent stools 01/04/2020   Pre-operative general physical examination 07/06/2019   Lower abdominal pain 08/16/2018   Stress reaction 09/05/2017   Estrogen deficiency 05/25/2016   Routine general medical examination at a health care facility 11/23/2015   Hammertoe 10/28/2015   Frequent loose stools 02/14/2015   Diverticulosis of colon without hemorrhage 02/14/2015   Colon cancer screening 08/19/2014   Abdominal pain, left lower quadrant 03/18/2014   Essential hypertension 02/25/2014   Encounter for Medicare annual wellness exam 07/16/2013   Prediabetes 05/14/2009   Rheumatoid arthritis with positive rheumatoid factor ([place]) 05/13/2008   Osteopenia 03/07/2007   Hyperlipidemia 01/03/2007   Obesity (BMI 30-39.9) 01/03/2007   ALLERGIC RHINITIS, SEASONAL 01/03/2007   ASTHMA 01/03/2007   OVERACTIVE BLADDER 01/03/2007    ONSET DATE: 12/31/21  REFERRING DIAG: R44.31 (ICD-10-CM) - Personal history of transient ischemic attack (TIA),  and cerebral infarction without residual deficits  THERAPY DIAG:  Unsteadiness on feet  Muscle weakness (generalized)  Difficulty in walking, not elsewhere classified  Rationale for Evaluation and Treatment Rehabilitation  SUBJECTIVE:                                                                                                                                                          SUBJECTIVE STATEMENT: Pt reports no pain, stumbles/LOB. She feels she has been improving.                                      PERTINENT HISTORY: Pt reports he biggest concern is her balance at the moment. She reports she is not dizzy necessarily but she is off. Pt reports no falls  in the last 6 months and she does not utilize an AD at this time. Pt has had PT in the past but balance was not the main focus, pt also has had PT for her R TKA.  With her balance pt feels when she gets up to walk she feels like she needs to hold onto something. Pt has 20 grandchildren who range from 2 years to 37 years and is also expecting a new great grandchild soon. Pt also enjoys travelling with her husband and tries to exercise for 20 minutes per day with walking or other light activities.   PAIN:  Are you having pain? No  PRECAUTIONS: Fall  WEIGHT BEARING RESTRICTIONS No  FALLS: Has patient fallen in last 6 months? No  LIVING ENVIRONMENT: Lives with: lives with their family and lives with their spouse Lives in: House/apartment Stairs: Yes: External: 3 steps; on right going up Has following equipment at home: Single point cane and Walker - 2 wheeled  PLOF: Independent and Independent with basic ADLs  PATIENT GOALS: Improve strength, balance, and confidence with activities  OBJECTIVE:   LOWER EXTREMITY MMT:    MMT Right Eval Left Eval  Hip flexion 4 4+  Hip extension    Hip abduction 4+ 4+  Hip adduction 5 5  Hip internal rotation    Hip external rotation    Knee flexion 4 4+  Knee extension 4 4+  Ankle dorsiflexion 5 5  Ankle plantarflexion 5 5  Ankle inversion    Ankle eversion    (Blank rows = not tested)   TODAY'S TREATMENT:  04/06/22     TE:   PreCore Leg press BLE #55 15x 1 set. Rates easy. Progresses to 60# 1x15. Pt rates medium  PreCore leg press Single limb #25 10x each LE     Mini-Squat 12x with BUE support cue for posterior weight shift  Seated with # 4 ankle weights   -Seated marches with  upright posture, back away from back of chair for abdominal/trunk activation/stabilization, 15x each LE -Seated LAQ with 3 second holds, 10x each LE  -heel raise 20x with 1-2 sec holds/rep. Pt reports discomfort with initial reps but that this resolved as  she performed more repetitions.   Seated adduction ball squeeze 10x 5 second  NMR: gait belt donned, CGA provided unless specified otherwise    On airex:   EC 3x30 sec. Rated difficult. Requires up to min assist.   EO vertical and horizontal head turns 10x for each    Dynamic balance activity - ambulation in hallway reading sticky notes to incorporate dual task and head turns 2x.      Over 10 meters:   NBOS/semi-tandem gait 2x without dual cog task and 2x with dual cog task. Noted decrease in postural stability with dual cog task.   SLB 2x30 sec each LE with finger touch support. Rates hard.     PATIENT EDUCATION: Education details: Pt educated throughout session about proper posture and technique with exercises. Improved exercise technique, movement at target joints, use of target muscles after min to mod verbal, visual, tactile cues.  Person educated: Patient Education method: Explanation, Demonstration, and Verbal cues Education comprehension: verbalized understanding, returned demonstration, verbal cues required, and needs further education   HOME EXERCISE PROGRAM: Access Code: [email or web address] URL: [email or web address] Date: 02/04/2022 Prepared by: Rivka Barbara  Exercises - Sit to Stand  - 1 x daily - 7 x weekly - 2 sets - 8 reps - Standing March with Counter Support  - 1 x daily - 7 x weekly - 2 sets - 10 reps - Side Stepping with Counter Support  - 1 x daily - 7 x weekly - 2 sets - 10 reps - Standing in "bowling stance" with counter support   - 1 x daily - 7 x weekly - 2 reps - 30 seocnd hold  Access Code: [email or web address] URL: [email or web address] Date: 03/18/2022 Prepared by: Janna Arch  Exercises - Leg Extension  - 1 x daily - 7 x weekly - 2 sets - 10 reps - 5 hold - Step Up  - 1 x daily - 7 x weekly - 2 sets - 10 reps - 5 hold - Lateral Step Ups  - 1 x daily - 7 x weekly - 2 sets - 10 reps - 5 hold   GOALS: Goals reviewed with patient?  Yes  SHORT TERM GOALS: Target date: 02/24/2022  Patient will be independent in home exercise program to improve strength/mobility for better functional independence with ADLs. Baseline: Goal status: INITIAL    LONG TERM GOALS: Target date: 05/12/2022  Patient will increase FOTO score to equal to or greater than  68   to demonstrate statistically significant improvement in mobility and quality of life.  Baseline: 63 Goal status: INITIAL  2.  Patient (> 66 years old) will complete five times sit to stand test in < 15 seconds indicating an increased LE strength and improved balance. Baseline: 17.3 s Goal status: INITIAL  3.  Patient will increase Berg Balance score by > 6 points to demonstrate decreased fall risk during functional activities. Baseline: 45 Goal status: INITIAL  4.   Patient will reduce timed up and go to <11 seconds to reduce fall risk and demonstrate improved transfer/gait ability. Baseline: 11.87 Goal status: INITIAL  5.  Patient will increase BLE gross strength to 4+/5 as to improve functional strength for independent gait, increased standing tolerance and increased ADL ability. Baseline: see  eval, R side weaker than left grossly  Goal status: INITIAL    ASSESSMENT:  CLINICAL IMPRESSION: Pt shows progress AEB performing therex with increased weights on this date, indicating improved BLE strength. While pt shows progress, she still has difficulty with maintaining balance with dual cognitive task, and also struggles to sustain SLB bilat. The patient will continue to benefit from skilled physical therapy to improve her lower extremity strength, balance, endurance, and improve her quality of life.      OBJECTIVE IMPAIRMENTS decreased activity tolerance, decreased balance, and decreased strength.   ACTIVITY LIMITATIONS bending, squatting, and stairs  PARTICIPATION LIMITATIONS: cleaning, laundry, community activity, and activities going to events for her  grandchildren   PERSONAL FACTORS Age and 1-2 comorbidities: HLD, HTN   are also affecting patient's functional outcome.   REHAB POTENTIAL: Excellent  CLINICAL DECISION MAKING: Stable/uncomplicated  EVALUATION COMPLEXITY: Low  PLAN: PT FREQUENCY: 2x/week  PT DURATION: 12 weeks  PLANNED INTERVENTIONS: Therapeutic exercises, Therapeutic activity, Neuromuscular re-education, Balance training, Gait training, Patient/Family education, Joint mobilization, and Stair training  PLAN FOR NEXT SESSION: continue Verdel, PT 04/06/2022, 4:58 PM

## 2022-04-08 ENCOUNTER — Encounter: Payer: Medicare HMO | Admitting: Occupational Therapy

## 2022-04-08 ENCOUNTER — Encounter: Payer: Medicare HMO | Admitting: Speech Pathology

## 2022-04-08 ENCOUNTER — Encounter: Payer: Self-pay | Admitting: Physical Therapy

## 2022-04-08 ENCOUNTER — Ambulatory Visit: Payer: Medicare HMO | Admitting: Physical Therapy

## 2022-04-08 DIAGNOSIS — M6281 Muscle weakness (generalized): Secondary | ICD-10-CM | POA: Diagnosis not present

## 2022-04-08 DIAGNOSIS — R269 Unspecified abnormalities of gait and mobility: Secondary | ICD-10-CM

## 2022-04-08 DIAGNOSIS — R2681 Unsteadiness on feet: Secondary | ICD-10-CM | POA: Diagnosis not present

## 2022-04-08 DIAGNOSIS — R262 Difficulty in walking, not elsewhere classified: Secondary | ICD-10-CM

## 2022-04-08 NOTE — Therapy (Signed)
OUTPATIENT PHYSICAL THERAPY TREATMENT   Patient Name: Cheryl Joseph MRN: [id number] DOB:Feb 20, 1941, 81 y.o., female Today's Date: 04/08/2022   PCP:   Abner Greenspan, MD   REFERRING PROVIDER: Vladimir Crofts, MD   PT End of Session - 04/08/22 1348     Visit Number 17    Number of Visits 24    Date for PT Re-Evaluation 04/21/22    Authorization Type [employer]    Authorization Time Period 01/27/22-04/21/22    Progress Note Due on Visit 10    PT Start Time 1345    PT Stop Time 1427    PT Time Calculation (min) 42 min    Equipment Utilized During Treatment Gait belt    Activity Tolerance Patient tolerated treatment well;No increased pain    Behavior During Therapy WFL for tasks assessed/performed                           Past Medical History:  Diagnosis Date   Anginal pain ([place])    Arthritis    RA hands(seronegative)   Asthma    Back pain    Cancer (HCC)    skin   Cataract    HPV in female 1996   HPV with colposcopy (all neg paps since)   Hyperlipidemia    Hypertension    Incarcerated ventral hernia    Obesity    Osteopenia    Pre-diabetes    Shortness of breath dyspnea    Shoulder pain    Past Surgical History:  Procedure Laterality Date   CATARACT EXTRACTION, BILATERAL Bilateral    COLONOSCOPY  06/01/2004   COLONOSCOPY WITH PROPOFOL N/A 05/01/2015   Procedure: COLONOSCOPY WITH PROPOFOL;  Surgeon: Manya Silvas, MD;  Location: [facility];  Service: Endoscopy;  Laterality: N/A;   CORRECTION HAMMER TOE Right    2nd toe   JOINT REPLACEMENT Right    right knee replacement January 2021   SQUAMOUS CELL CARCINOMA EXCISION Left 12/2017   left arm   TONSILLECTOMY     VAGINAL HYSTERECTOMY  2015   with pelvic organ prolapse surgery    XI ROBOTIC ASSISTED VENTRAL HERNIA N/A 06/10/2021   Procedure: XI ROBOTIC ASSISTED VENTRAL HERNIA;  Surgeon: Herbert Pun, MD;  Location: [facility];  Service: General;  Laterality: N/A;    Patient Active Problem List   Diagnosis Date Noted   Chronic headache 02/25/2022   Snoring 02/25/2022   Acute bilateral low back pain without sciatica 01/29/2022   Microscopic hematuria 01/29/2022   Abnormal urine 01/29/2022   History of stroke 42/68/3419   Umbilical hernia 62/22/9798   Frequent stools 01/04/2020   Pre-operative general physical examination 07/06/2019   Lower abdominal pain 08/16/2018   Stress reaction 09/05/2017   Estrogen deficiency 05/25/2016   Routine general medical examination at a health care facility 11/23/2015   Hammertoe 10/28/2015   Frequent loose stools 02/14/2015   Diverticulosis of colon without hemorrhage 02/14/2015   Colon cancer screening 08/19/2014   Abdominal pain, left lower quadrant 03/18/2014   Essential hypertension 02/25/2014   Encounter for Medicare annual wellness exam 07/16/2013   Prediabetes 05/14/2009   Rheumatoid arthritis with positive rheumatoid factor ([place]) 05/13/2008   Osteopenia 03/07/2007   Hyperlipidemia 01/03/2007   Obesity (BMI 30-39.9) 01/03/2007   ALLERGIC RHINITIS, SEASONAL 01/03/2007   ASTHMA 01/03/2007   OVERACTIVE BLADDER 01/03/2007    ONSET DATE: 12/31/21  REFERRING DIAG: X21.19 (ICD-10-CM) - Personal history of transient ischemic attack (  TIA), and cerebral infarction without residual deficits  THERAPY DIAG:  Unsteadiness on feet  Muscle weakness (generalized)  Difficulty in walking, not elsewhere classified  Abnormality of gait and mobility  Rationale for Evaluation and Treatment Rehabilitation  SUBJECTIVE:                                                                                                                                                          SUBJECTIVE STATEMENT: Pt reports no pain, stumbles/LOB. She feels she has been improving and doing well. Is a little tired from helping clean a car with her husband this morning.                                       PERTINENT HISTORY: Pt reports  he biggest concern is her balance at the moment. She reports she is not dizzy necessarily but she is off. Pt reports no falls in the last 6 months and she does not utilize an AD at this time. Pt has had PT in the past but balance was not the main focus, pt also has had PT for her R TKA.  With her balance pt feels when she gets up to walk she feels like she needs to hold onto something. Pt has 20 grandchildren who range from 2 years to 67 years and is also expecting a new great grandchild soon. Pt also enjoys travelling with her husband and tries to exercise for 20 minutes per day with walking or other light activities.   PAIN:  Are you having pain? No  PRECAUTIONS: Fall  WEIGHT BEARING RESTRICTIONS No  FALLS: Has patient fallen in last 6 months? No  LIVING ENVIRONMENT: Lives with: lives with their family and lives with their spouse Lives in: House/apartment Stairs: Yes: External: 3 steps; on right going up Has following equipment at home: Single point cane and Walker - 2 wheeled  PLOF: Independent and Independent with basic ADLs  PATIENT GOALS: Improve strength, balance, and confidence with activities  OBJECTIVE:   LOWER EXTREMITY MMT:    MMT Right Eval Left Eval  Hip flexion 4 4+  Hip extension    Hip abduction 4+ 4+  Hip adduction 5 5  Hip internal rotation    Hip external rotation    Knee flexion 4 4+  Knee extension 4 4+  Ankle dorsiflexion 5 5  Ankle plantarflexion 5 5  Ankle inversion    Ankle eversion    (Blank rows = not tested)   TODAY'S TREATMENT:  04/08/22     TE:   Nustep interval training  - 2 min level 2 - 1 min level 4 - 1 min level 2 - 1 min lvl 4 - 1 min lvl  2   PreCore Leg press BLE #55 15x 1 set. -70#x10 reps  PreCore leg press Single limb #25 10x each LE     Mini-Squat 2*12 with BUE support cue for posterior weight shift  Seated with # 4 ankle weights   -Seated LAQ with 3 second holds, 10x each LE  Forward march followed by retro  ambulation x 5 laps in // bars, cues for proper step width and following cues pt reports retro ambulation task as easier.  -heel raise 20x with 1-2 sec holds/rep. Pt reports discomfort with initial reps but that this resolved as she performed more repetitions.   NMR: gait belt donned, CGA provided unless specified otherwise    On airex:   NBOS on airex x 45 sec lateral head turns and 45 sec vertical head turns        Dynamic balance activity -stepping on an doff airex laterally x 5 ea, improved confidence and stability with practice.          PATIENT EDUCATION: Education details: Pt educated throughout session about proper posture and technique with exercises. Improved exercise technique, movement at target joints, use of target muscles after min to mod verbal, visual, tactile cues.  Person educated: Patient Education method: Explanation, Demonstration, and Verbal cues Education comprehension: verbalized understanding, returned demonstration, verbal cues required, and needs further education   HOME EXERCISE PROGRAM: Access Code: [email or web address] URL: [email or web address] Date: 02/04/2022 Prepared by: Rivka Barbara  Exercises - Sit to Stand  - 1 x daily - 7 x weekly - 2 sets - 8 reps - Standing March with Counter Support  - 1 x daily - 7 x weekly - 2 sets - 10 reps - Side Stepping with Counter Support  - 1 x daily - 7 x weekly - 2 sets - 10 reps - Standing in "bowling stance" with counter support   - 1 x daily - 7 x weekly - 2 reps - 30 seocnd hold  Access Code: [email or web address] URL: [email or web address] Date: 03/18/2022 Prepared by: Janna Arch  Exercises - Leg Extension  - 1 x daily - 7 x weekly - 2 sets - 10 reps - 5 hold - Step Up  - 1 x daily - 7 x weekly - 2 sets - 10 reps - 5 hold - Lateral Step Ups  - 1 x daily - 7 x weekly - 2 sets - 10 reps - 5 hold   GOALS: Goals reviewed with patient? Yes  SHORT TERM GOALS: Target date:  02/24/2022  Patient will be independent in home exercise program to improve strength/mobility for better functional independence with ADLs. Baseline: Goal status: INITIAL    LONG TERM GOALS: Target date: 05/12/2022  Patient will increase FOTO score to equal to or greater than  68   to demonstrate statistically significant improvement in mobility and quality of life.  Baseline: 63 Goal status: INITIAL  2.  Patient (> 51 years old) will complete five times sit to stand test in < 15 seconds indicating an increased LE strength and improved balance. Baseline: 17.3 s Goal status: INITIAL  3.  Patient will increase Berg Balance score by > 6 points to demonstrate decreased fall risk during functional activities. Baseline: 45 Goal status: INITIAL  4.   Patient will reduce timed up and go to <11 seconds to reduce fall risk and demonstrate improved transfer/gait ability. Baseline: 11.87 Goal status: INITIAL  5.  Patient will increase BLE gross strength to 4+/5 as to improve functional strength for independent  gait, increased standing tolerance and increased ADL ability. Baseline: see eval, R side weaker than left grossly  Goal status: INITIAL    ASSESSMENT:  CLINICAL IMPRESSION: Pt shows progress AEB performing therex with increased weights on this date, indicating improved BLE strength. Pt making progress with LE strength and dynamic balance and mobility tasks this date. The patient will continue to benefit from skilled physical therapy to improve her lower extremity strength, balance, endurance, and improve her quality of life.      OBJECTIVE IMPAIRMENTS decreased activity tolerance, decreased balance, and decreased strength.   ACTIVITY LIMITATIONS bending, squatting, and stairs  PARTICIPATION LIMITATIONS: cleaning, laundry, community activity, and activities going to events for her grandchildren   PERSONAL FACTORS Age and 1-2 comorbidities: HLD, HTN   are also affecting  patient's functional outcome.   REHAB POTENTIAL: Excellent  CLINICAL DECISION MAKING: Stable/uncomplicated  EVALUATION COMPLEXITY: Low  PLAN: PT FREQUENCY: 2x/week  PT DURATION: 12 weeks  PLANNED INTERVENTIONS: Therapeutic exercises, Therapeutic activity, Neuromuscular re-education, Balance training, Gait training, Patient/Family education, Joint mobilization, and Stair training  PLAN FOR NEXT SESSION: Continue POC     Particia Lather, PT 04/08/2022, 4:50 PM

## 2022-04-12 ENCOUNTER — Ambulatory Visit: Payer: Medicare HMO

## 2022-04-12 DIAGNOSIS — R269 Unspecified abnormalities of gait and mobility: Secondary | ICD-10-CM | POA: Diagnosis not present

## 2022-04-12 DIAGNOSIS — M6281 Muscle weakness (generalized): Secondary | ICD-10-CM | POA: Diagnosis not present

## 2022-04-12 DIAGNOSIS — R2681 Unsteadiness on feet: Secondary | ICD-10-CM | POA: Diagnosis not present

## 2022-04-12 DIAGNOSIS — R262 Difficulty in walking, not elsewhere classified: Secondary | ICD-10-CM | POA: Diagnosis not present

## 2022-04-12 NOTE — Therapy (Signed)
OUTPATIENT PHYSICAL THERAPY TREATMENT   Patient Name: Cheryl Joseph MRN: [id number] DOB:January 24, 1941, 81 y.o., female Today's Date: 04/12/2022   PCP:   Abner Greenspan, MD   REFERRING PROVIDER: Vladimir Crofts, MD   PT End of Session - 04/12/22 1024     Visit Number 18    Number of Visits 24    Date for PT Re-Evaluation 04/21/22    Authorization Type [employer]    Authorization Time Period 01/27/22-04/21/22    Progress Note Due on Visit 10    PT Start Time 1020    PT Stop Time 1100    PT Time Calculation (min) 40 min    Equipment Utilized During Treatment Gait belt    Activity Tolerance Patient tolerated treatment well;No increased pain    Behavior During Therapy WFL for tasks assessed/performed                            Past Medical History:  Diagnosis Date   Anginal pain ([place])    Arthritis    RA hands(seronegative)   Asthma    Back pain    Cancer (HCC)    skin   Cataract    HPV in female 1996   HPV with colposcopy (all neg paps since)   Hyperlipidemia    Hypertension    Incarcerated ventral hernia    Obesity    Osteopenia    Pre-diabetes    Shortness of breath dyspnea    Shoulder pain    Past Surgical History:  Procedure Laterality Date   CATARACT EXTRACTION, BILATERAL Bilateral    COLONOSCOPY  06/01/2004   COLONOSCOPY WITH PROPOFOL N/A 05/01/2015   Procedure: COLONOSCOPY WITH PROPOFOL;  Surgeon: Manya Silvas, MD;  Location: [facility];  Service: Endoscopy;  Laterality: N/A;   CORRECTION HAMMER TOE Right    2nd toe   JOINT REPLACEMENT Right    right knee replacement January 2021   SQUAMOUS CELL CARCINOMA EXCISION Left 12/2017   left arm   TONSILLECTOMY     VAGINAL HYSTERECTOMY  2015   with pelvic organ prolapse surgery    XI ROBOTIC ASSISTED VENTRAL HERNIA N/A 06/10/2021   Procedure: XI ROBOTIC ASSISTED VENTRAL HERNIA;  Surgeon: Herbert Pun, MD;  Location: [facility];  Service: General;  Laterality: N/A;    Patient Active Problem List   Diagnosis Date Noted   Chronic headache 02/25/2022   Snoring 02/25/2022   Acute bilateral low back pain without sciatica 01/29/2022   Microscopic hematuria 01/29/2022   Abnormal urine 01/29/2022   History of stroke 51/76/1607   Umbilical hernia 37/04/6268   Frequent stools 01/04/2020   Pre-operative general physical examination 07/06/2019   Lower abdominal pain 08/16/2018   Stress reaction 09/05/2017   Estrogen deficiency 05/25/2016   Routine general medical examination at a health care facility 11/23/2015   Hammertoe 10/28/2015   Frequent loose stools 02/14/2015   Diverticulosis of colon without hemorrhage 02/14/2015   Colon cancer screening 08/19/2014   Abdominal pain, left lower quadrant 03/18/2014   Essential hypertension 02/25/2014   Encounter for Medicare annual wellness exam 07/16/2013   Prediabetes 05/14/2009   Rheumatoid arthritis with positive rheumatoid factor ([place]) 05/13/2008   Osteopenia 03/07/2007   Hyperlipidemia 01/03/2007   Obesity (BMI 30-39.9) 01/03/2007   ALLERGIC RHINITIS, SEASONAL 01/03/2007   ASTHMA 01/03/2007   OVERACTIVE BLADDER 01/03/2007    ONSET DATE: 12/31/21  REFERRING DIAG: S85.46 (ICD-10-CM) - Personal history of transient ischemic  attack (TIA), and cerebral infarction without residual deficits  THERAPY DIAG:  Muscle weakness (generalized)  Unsteadiness on feet  Rationale for Evaluation and Treatment Rehabilitation  SUBJECTIVE:                                                                                                                                                          SUBJECTIVE STATEMENT: Pt reports no pain, stumbles/LOB. She reports she feels a lack of energy.                                      PERTINENT HISTORY: Pt reports he biggest concern is her balance at the moment. She reports she is not dizzy necessarily but she is off. Pt reports no falls in the last 6 months and she does not utilize  an AD at this time. Pt has had PT in the past but balance was not the main focus, pt also has had PT for her R TKA.  With her balance pt feels when she gets up to walk she feels like she needs to hold onto something. Pt has 20 grandchildren who range from 2 years to 43 years and is also expecting a new great grandchild soon. Pt also enjoys travelling with her husband and tries to exercise for 20 minutes per day with walking or other light activities.   PAIN:  Are you having pain? No  PRECAUTIONS: Fall  WEIGHT BEARING RESTRICTIONS No  FALLS: Has patient fallen in last 6 months? No  LIVING ENVIRONMENT: Lives with: lives with their family and lives with their spouse Lives in: House/apartment Stairs: Yes: External: 3 steps; on right going up Has following equipment at home: Single point cane and Walker - 2 wheeled  PLOF: Independent and Independent with basic ADLs  PATIENT GOALS: Improve strength, balance, and confidence with activities  OBJECTIVE:   LOWER EXTREMITY MMT:    MMT Right Eval Left Eval  Hip flexion 4 4+  Hip extension    Hip abduction 4+ 4+  Hip adduction 5 5  Hip internal rotation    Hip external rotation    Knee flexion 4 4+  Knee extension 4 4+  Ankle dorsiflexion 5 5  Ankle plantarflexion 5 5  Ankle inversion    Ankle eversion    (Blank rows = not tested)   TODAY'S TREATMENT:  04/12/22    TE:  Seated with # 5 ankle weights   -Seated LAQ with 3 second holds, 10x each LE. Rates easy -Seated march 20x2 sets alt LE  Standing heel raise 2x20 B. Rates easy Standing hip abd 15x B  PreCore Leg press BLE #55 15x 1 set. -70# 2x10 reps. Rates medium  Nustep interval training (seat 8).  Cuing for increased SPM. Maintains SPM in 60s-70s. PT monitors pt throughout for response, adjusts intensity of exercise  - 1 min level 1 - 1 min level 2 - 1 min level 4 - 1 min level 5 Pt states, "This one's working me."  - 1 min level 4 - 1 min level 2  - 1 min level  1     NMR: gait belt donned, CGA provided unless specified otherwise    At support surface-   SLB 3x30 sec each LE   Tandem stance 3x30 sec each LE. Moderate    On airex:   NBOS lateral and vertical head turns 10x each   Slow march 20x alt LE   Tandem stance 2x30 sec each LE    NBOS EC firm surface 4x30 sec. Requires min assist.     PATIENT EDUCATION: Education details: Pt educated throughout session about proper posture and technique with exercises. Improved exercise technique, movement at target joints, use of target muscles after min to mod verbal, visual, tactile cues.  Person educated: Patient Education method: Explanation, Demonstration, and Verbal cues Education comprehension: verbalized understanding, returned demonstration, verbal cues required, and needs further education   HOME EXERCISE PROGRAM: Access Code: [email or web address] URL: [email or web address] Date: 02/04/2022 Prepared by: Rivka Barbara  Exercises - Sit to Stand  - 1 x daily - 7 x weekly - 2 sets - 8 reps - Standing March with Counter Support  - 1 x daily - 7 x weekly - 2 sets - 10 reps - Side Stepping with Counter Support  - 1 x daily - 7 x weekly - 2 sets - 10 reps - Standing in "bowling stance" with counter support   - 1 x daily - 7 x weekly - 2 reps - 30 seocnd hold  Access Code: [email or web address] URL: [email or web address] Date: 03/18/2022 Prepared by: Janna Arch  Exercises - Leg Extension  - 1 x daily - 7 x weekly - 2 sets - 10 reps - 5 hold - Step Up  - 1 x daily - 7 x weekly - 2 sets - 10 reps - 5 hold - Lateral Step Ups  - 1 x daily - 7 x weekly - 2 sets - 10 reps - 5 hold   GOALS: Goals reviewed with patient? Yes  SHORT TERM GOALS: Target date: 02/24/2022  Patient will be independent in home exercise program to improve strength/mobility for better functional independence with ADLs. Baseline: Goal status: INITIAL    LONG TERM GOALS: Target date:  05/12/2022  Patient will increase FOTO score to equal to or greater than  68   to demonstrate statistically significant improvement in mobility and quality of life.  Baseline: 63 Goal status: INITIAL  2.  Patient (> 4 years old) will complete five times sit to stand test in < 15 seconds indicating an increased LE strength and improved balance. Baseline: 17.3 s Goal status: INITIAL  3.  Patient will increase Berg Balance score by > 6 points to demonstrate decreased fall risk during functional activities. Baseline: 45 Goal status: INITIAL  4.   Patient will reduce timed up and go to <11 seconds to reduce fall risk and demonstrate improved transfer/gait ability. Baseline: 11.87 Goal status: INITIAL  5.  Patient will increase BLE gross strength to 4+/5 as to improve functional strength for independent gait, increased standing tolerance and increased ADL ability. Baseline: see eval, R side weaker than left grossly  Goal status: INITIAL    ASSESSMENT:  CLINICAL IMPRESSION: Pt continues  to progress therex in both volume of reps and intensity, indicting improved BLE strength and endurance. While pt shows progress, she still has difficulty with compliant surface, EC, and NBOS balance tasks. She required up to min assist to correct for LOB with EC on firm surface. The patient will continue to benefit from skilled physical therapy to improve her lower extremity strength, balance, endurance, and improve her quality of life.      OBJECTIVE IMPAIRMENTS decreased activity tolerance, decreased balance, and decreased strength.   ACTIVITY LIMITATIONS bending, squatting, and stairs  PARTICIPATION LIMITATIONS: cleaning, laundry, community activity, and activities going to events for her grandchildren   PERSONAL FACTORS Age and 1-2 comorbidities: HLD, HTN   are also affecting patient's functional outcome.   REHAB POTENTIAL: Excellent  CLINICAL DECISION MAKING:  Stable/uncomplicated  EVALUATION COMPLEXITY: Low  PLAN: PT FREQUENCY: 2x/week  PT DURATION: 12 weeks  PLANNED INTERVENTIONS: Therapeutic exercises, Therapeutic activity, Neuromuscular re-education, Balance training, Gait training, Patient/Family education, Joint mobilization, and Stair training  PLAN FOR NEXT SESSION: Continue POC     Zollie Pee, PT 04/12/2022, 12:02 PM

## 2022-04-13 ENCOUNTER — Ambulatory Visit: Payer: Medicare HMO

## 2022-04-13 ENCOUNTER — Encounter: Payer: Medicare PPO | Admitting: Occupational Therapy

## 2022-04-13 ENCOUNTER — Encounter: Payer: Medicare HMO | Admitting: Speech Pathology

## 2022-04-14 NOTE — Therapy (Signed)
OUTPATIENT PHYSICAL THERAPY TREATMENT   Patient Name: Cheryl Joseph MRN: [id number] DOB:November 06, 1940, 81 y.o., female Today's Date: 04/15/2022   PCP:   Abner Greenspan, MD   REFERRING PROVIDER: Vladimir Crofts, MD   PT End of Session - 04/15/22 1301     Visit Number 19    Number of Visits 24    Date for PT Re-Evaluation 04/21/22    Authorization Type [employer]    Authorization Time Period 01/27/22-04/21/22    Progress Note Due on Visit 10    PT Start Time 1300    PT Stop Time 1344    PT Time Calculation (min) 44 min    Equipment Utilized During Treatment Gait belt    Activity Tolerance Patient tolerated treatment well;No increased pain    Behavior During Therapy WFL for tasks assessed/performed                             Past Medical History:  Diagnosis Date   Anginal pain ([place])    Arthritis    RA hands(seronegative)   Asthma    Back pain    Cancer (HCC)    skin   Cataract    HPV in female 1996   HPV with colposcopy (all neg paps since)   Hyperlipidemia    Hypertension    Incarcerated ventral hernia    Obesity    Osteopenia    Pre-diabetes    Shortness of breath dyspnea    Shoulder pain    Past Surgical History:  Procedure Laterality Date   CATARACT EXTRACTION, BILATERAL Bilateral    COLONOSCOPY  06/01/2004   COLONOSCOPY WITH PROPOFOL N/A 05/01/2015   Procedure: COLONOSCOPY WITH PROPOFOL;  Surgeon: Manya Silvas, MD;  Location: [facility];  Service: Endoscopy;  Laterality: N/A;   CORRECTION HAMMER TOE Right    2nd toe   JOINT REPLACEMENT Right    right knee replacement January 2021   SQUAMOUS CELL CARCINOMA EXCISION Left 12/2017   left arm   TONSILLECTOMY     VAGINAL HYSTERECTOMY  2015   with pelvic organ prolapse surgery    XI ROBOTIC ASSISTED VENTRAL HERNIA N/A 06/10/2021   Procedure: XI ROBOTIC ASSISTED VENTRAL HERNIA;  Surgeon: Herbert Pun, MD;  Location: [facility];  Service: General;  Laterality: N/A;    Patient Active Problem List   Diagnosis Date Noted   Chronic headache 02/25/2022   Snoring 02/25/2022   Acute bilateral low back pain without sciatica 01/29/2022   Microscopic hematuria 01/29/2022   Abnormal urine 01/29/2022   History of stroke 62/83/6629   Umbilical hernia 47/65/4650   Frequent stools 01/04/2020   Pre-operative general physical examination 07/06/2019   Lower abdominal pain 08/16/2018   Stress reaction 09/05/2017   Estrogen deficiency 05/25/2016   Routine general medical examination at a health care facility 11/23/2015   Hammertoe 10/28/2015   Frequent loose stools 02/14/2015   Diverticulosis of colon without hemorrhage 02/14/2015   Colon cancer screening 08/19/2014   Abdominal pain, left lower quadrant 03/18/2014   Essential hypertension 02/25/2014   Encounter for Medicare annual wellness exam 07/16/2013   Prediabetes 05/14/2009   Rheumatoid arthritis with positive rheumatoid factor ([place]) 05/13/2008   Osteopenia 03/07/2007   Hyperlipidemia 01/03/2007   Obesity (BMI 30-39.9) 01/03/2007   ALLERGIC RHINITIS, SEASONAL 01/03/2007   ASTHMA 01/03/2007   OVERACTIVE BLADDER 01/03/2007    ONSET DATE: 12/31/21  REFERRING DIAG: P54.65 (ICD-10-CM) - Personal history of transient  ischemic attack (TIA), and cerebral infarction without residual deficits  THERAPY DIAG:  Muscle weakness (generalized)  Unsteadiness on feet  Difficulty in walking, not elsewhere classified  Abnormality of gait and mobility  Rationale for Evaluation and Treatment Rehabilitation  SUBJECTIVE:                                                                                                                                                          SUBJECTIVE STATEMENT: Patient was at the [employer] for a few hours prior to PT making her tired.                                       PERTINENT HISTORY: Pt reports he biggest concern is her balance at the moment. She reports she is not dizzy  necessarily but she is off. Pt reports no falls in the last 6 months and she does not utilize an AD at this time. Pt has had PT in the past but balance was not the main focus, pt also has had PT for her R TKA.  With her balance pt feels when she gets up to walk she feels like she needs to hold onto something. Pt has 20 grandchildren who range from 2 years to 48 years and is also expecting a new great grandchild soon. Pt also enjoys travelling with her husband and tries to exercise for 20 minutes per day with walking or other light activities.   PAIN:  Are you having pain? No  PRECAUTIONS: Fall  WEIGHT BEARING RESTRICTIONS No  FALLS: Has patient fallen in last 6 months? No  LIVING ENVIRONMENT: Lives with: lives with their family and lives with their spouse Lives in: House/apartment Stairs: Yes: External: 3 steps; on right going up Has following equipment at home: Single point cane and Walker - 2 wheeled  PLOF: Independent and Independent with basic ADLs  PATIENT GOALS: Improve strength, balance, and confidence with activities  OBJECTIVE:   LOWER EXTREMITY MMT:    MMT Right Eval Left Eval  Hip flexion 4 4+  Hip extension    Hip abduction 4+ 4+  Hip adduction 5 5  Hip internal rotation    Hip external rotation    Knee flexion 4 4+  Knee extension 4 4+  Ankle dorsiflexion 5 5  Ankle plantarflexion 5 5  Ankle inversion    Ankle eversion    (Blank rows = not tested)   TODAY'S TREATMENT:  04/15/22    TE:  Seated with # 5 ankle weights   -Seated LAQ with 3 second holds, 12x each LE.  -Seated march12x each LE  Standing three way hip hike 10x each side   PreCore Leg press BLE #70; 10x; 2  sets ; rates as medium.    Nustep interval training (seat 8). Cuing for increased SPM. Maintains SPM in 60s-70s. PT monitors pt throughout for response, adjusts intensity of exercise. Level 3 ; 4 minutes    NMR: gait belt donned, CGA provided unless specified otherwise    Standing  with CGA next to support surface:  Airex pad: static stand 30 seconds x 2 trials, noticeable trembling of ankles/LE's with fatigue and challenge to maintain stability Airex pad: horizontal head turns 30 seconds scanning room 10x ; cueing for arc of motion  Airex pad: vertical head turns 30 seconds, cueing for arc of motion, noticeable sway with upward gaze increasing demand on ankle righting reaction musculature Airex pad: one foot on 6" step one foot on airex pad, hold position for 30 seconds, switch legs, 2x each LE; Airex pad 6" step airex pad lateral step up 10x each side ; no UE support     PATIENT EDUCATION: Education details: Pt educated throughout session about proper posture and technique with exercises. Improved exercise technique, movement at target joints, use of target muscles after min to mod verbal, visual, tactile cues.  Person educated: Patient Education method: Explanation, Demonstration, and Verbal cues Education comprehension: verbalized understanding, returned demonstration, verbal cues required, and needs further education   HOME EXERCISE PROGRAM: Access Code: [email or web address] URL: [email or web address] Date: 02/04/2022 Prepared by: Rivka Barbara  Exercises - Sit to Stand  - 1 x daily - 7 x weekly - 2 sets - 8 reps - Standing March with Counter Support  - 1 x daily - 7 x weekly - 2 sets - 10 reps - Side Stepping with Counter Support  - 1 x daily - 7 x weekly - 2 sets - 10 reps - Standing in "bowling stance" with counter support   - 1 x daily - 7 x weekly - 2 reps - 30 seocnd hold  Access Code: [email or web address] URL: [email or web address] Date: 03/18/2022 Prepared by: Janna Arch  Exercises - Leg Extension  - 1 x daily - 7 x weekly - 2 sets - 10 reps - 5 hold - Step Up  - 1 x daily - 7 x weekly - 2 sets - 10 reps - 5 hold - Lateral Step Ups  - 1 x daily - 7 x weekly - 2 sets - 10 reps - 5 hold   GOALS: Goals reviewed with patient?  Yes  SHORT TERM GOALS: Target date: 02/24/2022  Patient will be independent in home exercise program to improve strength/mobility for better functional independence with ADLs. Baseline: Goal status: INITIAL    LONG TERM GOALS: Target date: 05/12/2022  Patient will increase FOTO score to equal to or greater than  68   to demonstrate statistically significant improvement in mobility and quality of life.  Baseline: 63 Goal status: INITIAL  2.  Patient (> 32 years old) will complete five times sit to stand test in < 15 seconds indicating an increased LE strength and improved balance. Baseline: 17.3 s Goal status: INITIAL  3.  Patient will increase Berg Balance score by > 6 points to demonstrate decreased fall risk during functional activities. Baseline: 45 Goal status: INITIAL  4.   Patient will reduce timed up and go to <11 seconds to reduce fall risk and demonstrate improved transfer/gait ability. Baseline: 11.87 Goal status: INITIAL  5.  Patient will increase BLE gross strength to 4+/5 as to improve functional strength for independent gait, increased standing tolerance and increased ADL ability. Baseline: see eval,  R side weaker than left grossly  Goal status: INITIAL    ASSESSMENT:  CLINICAL IMPRESSION: Patient presents with more fatigue but remains highly motivated. Patient is able to tolerate continued strengthening on leg press. Her ankle righting reactions continue to progress with decreased instability on unstable surfaces.  The patient will continue to benefit from skilled physical therapy to improve her lower extremity strength, balance, endurance, and improve her quality of life.      OBJECTIVE IMPAIRMENTS decreased activity tolerance, decreased balance, and decreased strength.   ACTIVITY LIMITATIONS bending, squatting, and stairs  PARTICIPATION LIMITATIONS: cleaning, laundry, community activity, and activities going to events for her grandchildren   PERSONAL  FACTORS Age and 1-2 comorbidities: HLD, HTN   are also affecting patient's functional outcome.   REHAB POTENTIAL: Excellent  CLINICAL DECISION MAKING: Stable/uncomplicated  EVALUATION COMPLEXITY: Low  PLAN: PT FREQUENCY: 2x/week  PT DURATION: 12 weeks  PLANNED INTERVENTIONS: Therapeutic exercises, Therapeutic activity, Neuromuscular re-education, Balance training, Gait training, Patient/Family education, Joint mobilization, and Stair training  PLAN FOR NEXT SESSION: Continue POC     Janna Arch, PT 04/15/2022, 1:44 PM

## 2022-04-15 ENCOUNTER — Encounter: Payer: Medicare HMO | Admitting: Occupational Therapy

## 2022-04-15 ENCOUNTER — Ambulatory Visit: Payer: Medicare HMO

## 2022-04-15 DIAGNOSIS — R262 Difficulty in walking, not elsewhere classified: Secondary | ICD-10-CM

## 2022-04-15 DIAGNOSIS — R269 Unspecified abnormalities of gait and mobility: Secondary | ICD-10-CM

## 2022-04-15 DIAGNOSIS — M6281 Muscle weakness (generalized): Secondary | ICD-10-CM

## 2022-04-15 DIAGNOSIS — R2681 Unsteadiness on feet: Secondary | ICD-10-CM | POA: Diagnosis not present

## 2022-04-20 ENCOUNTER — Encounter: Payer: Medicare HMO | Admitting: Occupational Therapy

## 2022-04-20 ENCOUNTER — Ambulatory Visit: Payer: Medicare HMO | Admitting: Physical Therapy

## 2022-04-20 ENCOUNTER — Encounter: Payer: Medicare HMO | Admitting: Speech Pathology

## 2022-04-20 DIAGNOSIS — R269 Unspecified abnormalities of gait and mobility: Secondary | ICD-10-CM | POA: Diagnosis not present

## 2022-04-20 DIAGNOSIS — M6281 Muscle weakness (generalized): Secondary | ICD-10-CM | POA: Diagnosis not present

## 2022-04-20 DIAGNOSIS — R2681 Unsteadiness on feet: Secondary | ICD-10-CM

## 2022-04-20 DIAGNOSIS — R262 Difficulty in walking, not elsewhere classified: Secondary | ICD-10-CM | POA: Diagnosis not present

## 2022-04-20 NOTE — Therapy (Signed)
OUTPATIENT PHYSICAL THERAPY TREATMENT/Physical Therapy Progress Note/physical therapy discharge summary   Dates of reporting period  03/04/22   to   04/20/22      Patient Name: Cheryl Joseph MRN: [id number] DOB:August 27, 1940, 81 y.o., female Today's Date: 04/20/2022   PCP:   Abner Greenspan, MD   REFERRING PROVIDER: Vladimir Crofts, MD   PT End of Session - 04/20/22 1517     Visit Number 20    Number of Visits 24    Date for PT Re-Evaluation 04/21/22    Authorization Type [employer]    Authorization Time Period 01/27/22-04/21/22    Progress Note Due on Visit 20    PT Start Time 1517    PT Stop Time 1550    PT Time Calculation (min) 33 min    Equipment Utilized During Treatment Gait belt    Activity Tolerance Patient tolerated treatment well;No increased pain    Behavior During Therapy WFL for tasks assessed/performed                              Past Medical History:  Diagnosis Date   Anginal pain ([place])    Arthritis    RA hands(seronegative)   Asthma    Back pain    Cancer ([facility])    skin   Cataract    HPV in female 1996   HPV with colposcopy (all neg paps since)   Hyperlipidemia    Hypertension    Incarcerated ventral hernia    Obesity    Osteopenia    Pre-diabetes    Shortness of breath dyspnea    Shoulder pain    Past Surgical History:  Procedure Laterality Date   CATARACT EXTRACTION, BILATERAL Bilateral    COLONOSCOPY  06/01/2004   COLONOSCOPY WITH PROPOFOL N/A 05/01/2015   Procedure: COLONOSCOPY WITH PROPOFOL;  Surgeon: Manya Silvas, MD;  Location: [place];  Service: Endoscopy;  Laterality: N/A;   CORRECTION HAMMER TOE Right    2nd toe   JOINT REPLACEMENT Right    right knee replacement January 2021   SQUAMOUS CELL CARCINOMA EXCISION Left 12/2017   left arm   TONSILLECTOMY     VAGINAL HYSTERECTOMY  2015   with pelvic organ prolapse surgery    XI ROBOTIC ASSISTED VENTRAL HERNIA N/A 06/10/2021   Procedure: XI  ROBOTIC ASSISTED VENTRAL HERNIA;  Surgeon: Herbert Pun, MD;  Location: [facility];  Service: General;  Laterality: N/A;   Patient Active Problem List   Diagnosis Date Noted   Chronic headache 02/25/2022   Snoring 02/25/2022   Acute bilateral low back pain without sciatica 01/29/2022   Microscopic hematuria 01/29/2022   Abnormal urine 01/29/2022   History of stroke 78/29/5621   Umbilical hernia 30/86/5784   Frequent stools 01/04/2020   Pre-operative general physical examination 07/06/2019   Lower abdominal pain 08/16/2018   Stress reaction 09/05/2017   Estrogen deficiency 05/25/2016   Routine general medical examination at a health care facility 11/23/2015   Hammertoe 10/28/2015   Frequent loose stools 02/14/2015   Diverticulosis of colon without hemorrhage 02/14/2015   Colon cancer screening 08/19/2014   Abdominal pain, left lower quadrant 03/18/2014   Essential hypertension 02/25/2014   Encounter for Medicare annual wellness exam 07/16/2013   Prediabetes 05/14/2009   Rheumatoid arthritis with positive rheumatoid factor ([place]) 05/13/2008   Osteopenia 03/07/2007   Hyperlipidemia 01/03/2007   Obesity (BMI 30-39.9) 01/03/2007   ALLERGIC RHINITIS, SEASONAL 01/03/2007  ASTHMA 01/03/2007   OVERACTIVE BLADDER 01/03/2007    ONSET DATE: 12/31/21  REFERRING DIAG: S06.30 (ICD-10-CM) - Personal history of transient ischemic attack (TIA), and cerebral infarction without residual deficits  THERAPY DIAG:  Muscle weakness (generalized)  Unsteadiness on feet  Difficulty in walking, not elsewhere classified  Abnormality of gait and mobility  Rationale for Evaluation and Treatment Rehabilitation  SUBJECTIVE:                                                                                                                                                          SUBJECTIVE STATEMENT: Patient reports she is doing well and has recently been able to navigate small steps without upper  extremity support indicating further improvements in function.                                      PERTINENT HISTORY: Pt reports he biggest concern is her balance at the moment. She reports she is not dizzy necessarily but she is off. Pt reports no falls in the last 6 months and she does not utilize an AD at this time. Pt has had PT in the past but balance was not the main focus, pt also has had PT for her R TKA.  With her balance pt feels when she gets up to walk she feels like she needs to hold onto something. Pt has 20 grandchildren who range from 2 years to 63 years and is also expecting a new great grandchild soon. Pt also enjoys travelling with her husband and tries to exercise for 20 minutes per day with walking or other light activities.   PAIN:  Are you having pain? No  PRECAUTIONS: Fall  WEIGHT BEARING RESTRICTIONS No  FALLS: Has patient fallen in last 6 months? No  LIVING ENVIRONMENT: Lives with: lives with their family and lives with their spouse Lives in: House/apartment Stairs: Yes: External: 3 steps; on right going up Has following equipment at home: Single point cane and Walker - 2 wheeled  PLOF: Independent and Independent with basic ADLs  PATIENT GOALS: Improve strength, balance, and confidence with activities  OBJECTIVE:   LOWER EXTREMITY MMT:    MMT Right Eval Left Eval  Hip flexion 4 4+  Hip extension    Hip abduction 4+ 4+  Hip adduction 5 5  Hip internal rotation    Hip external rotation    Knee flexion 4 4+  Knee extension 4 4+  Ankle dorsiflexion 5 5  Ankle plantarflexion 5 5  Ankle inversion    Ankle eversion    (Blank rows = not tested)   TODAY'S TREATMENT:  04/20/22 Physical therapy treatment session today consisted of completing assessment of goals and administration of testing  as demonstrated and documented in flow sheet, treatment, and goals section of this note. Addition treatments may be found below.    PATIENT EDUCATION: Education  details: Pt educated throughout session about proper posture and technique with exercises. Improved exercise technique, movement at target joints, use of target muscles after min to mod verbal, visual, tactile cues.  Person educated: Patient Education method: Explanation, Demonstration, and Verbal cues Education comprehension: verbalized understanding, returned demonstration, verbal cues required, and needs further education   HOME EXERCISE PROGRAM: Access Code: [email or web address] URL: [email or web address] Date: 02/04/2022 Prepared by: Rivka Barbara  Exercises - Sit to Stand  - 1 x daily - 7 x weekly - 2 sets - 8 reps - Standing March with Counter Support  - 1 x daily - 7 x weekly - 2 sets - 10 reps - Side Stepping with Counter Support  - 1 x daily - 7 x weekly - 2 sets - 10 reps - Standing in "bowling stance" with counter support   - 1 x daily - 7 x weekly - 2 reps - 30 seocnd hold  Access Code: [email or web address] URL: [email or web address] Date: 03/18/2022 Prepared by: Janna Arch  Exercises - Leg Extension  - 1 x daily - 7 x weekly - 2 sets - 10 reps - 5 hold - Step Up  - 1 x daily - 7 x weekly - 2 sets - 10 reps - 5 hold - Lateral Step Ups  - 1 x daily - 7 x weekly - 2 sets - 10 reps - 5 hold   GOALS: Goals reviewed with patient? Yes  SHORT TERM GOALS: Target date: 02/24/2022  Patient will be independent in home exercise program to improve strength/mobility for better functional independence with ADLs. Baseline: Goal status: Achieved     LONG TERM GOALS: Target date: 05/12/2022  Patient will increase FOTO score to equal to or greater than  68   to demonstrate statistically significant improvement in mobility and quality of life.  Baseline: 62 (03/04/22)  Goal status: on-going  2.  Patient (> 67 years old) will complete five times sit to stand test in < 15 seconds indicating an increased LE strength and improved balance. Baseline: 11.98sec (03/04/22)  Goal  status: achieved  3.  Patient will increase Berg Balance score by > 6 points to demonstrate decreased fall risk during functional activities. Baseline: 45 9/26:55 Goal status: Achieved    4.   Patient will reduce timed up and go to <11 seconds to reduce fall risk and demonstrate improved transfer/gait ability. Baseline: 8.55sec (03/04/22)  Goal status: achieved  5.  Patient will increase BLE gross strength to 4+/5 as to improve functional strength for independent gait, increased standing tolerance and increased ADL ability. Baseline: see eval, right side and left side equal strength bilaterally. Goal status: met    ASSESSMENT:  CLINICAL IMPRESSION: Patient presents to physical therapy for progress note this date.  Patient assessed for all goals patient has reached and surpassed all of her goals.  Patient completed Merrilee Jansky balance scale this date and improved to 55-56 on this test indicating low risk of falls.  Patient also completes dynamic gait index score and here also indicates that risk of falls.  Patient instructed to continue to practice with her home exercise program including addition of step ups to work on her functional ability to complete this.  Patient thoroughly educated regarding importance of continuing with home exercise program and general exercise to maintain her strength and mobility gains she has made  in physical therapy.  Patient will be discharged for physical therapy at this time and all questions answered at end of session.      OBJECTIVE IMPAIRMENTS decreased activity tolerance, decreased balance, and decreased strength.   ACTIVITY LIMITATIONS bending, squatting, and stairs  PARTICIPATION LIMITATIONS: cleaning, laundry, community activity, and activities going to events for her grandchildren   PERSONAL FACTORS Age and 1-2 comorbidities: HLD, HTN   are also affecting patient's functional outcome.   REHAB POTENTIAL: Excellent  CLINICAL DECISION MAKING:  Stable/uncomplicated  EVALUATION COMPLEXITY: Low  PLAN: PT FREQUENCY: 2x/week  PT DURATION: 12 weeks  PLANNED INTERVENTIONS: Therapeutic exercises, Therapeutic activity, Neuromuscular re-education, Balance training, Gait training, Patient/Family education, Joint mobilization, and Stair training  PLAN FOR NEXT SESSION: Discharged with home exercise program   Particia Lather, PT 04/20/2022, 5:07 PM

## 2022-04-22 ENCOUNTER — Encounter: Payer: Medicare HMO | Admitting: Occupational Therapy

## 2022-04-22 ENCOUNTER — Encounter: Payer: Medicare HMO | Admitting: Speech Pathology

## 2022-04-22 ENCOUNTER — Ambulatory Visit: Payer: Medicare HMO

## 2022-04-23 ENCOUNTER — Other Ambulatory Visit (INDEPENDENT_AMBULATORY_CARE_PROVIDER_SITE_OTHER): Payer: Medicare HMO

## 2022-04-23 ENCOUNTER — Ambulatory Visit (INDEPENDENT_AMBULATORY_CARE_PROVIDER_SITE_OTHER): Payer: Medicare HMO

## 2022-04-23 DIAGNOSIS — Z23 Encounter for immunization: Secondary | ICD-10-CM | POA: Diagnosis not present

## 2022-04-23 DIAGNOSIS — E785 Hyperlipidemia, unspecified: Secondary | ICD-10-CM

## 2022-04-23 LAB — AST: AST: 20 U/L (ref 0–37)

## 2022-04-23 LAB — LIPID PANEL
Cholesterol: 160 mg/dL (ref 0–200)
HDL: 42.6 mg/dL (ref >39.00)
LDL Cholesterol: 88 mg/dL (ref 0–99)
NonHDL: 117.7
Total CHOL/HDL Ratio: 4
Triglycerides: 147 mg/dL (ref 0.0–149.0)
VLDL: 29.4 mg/dL (ref 0.0–40.0)

## 2022-04-23 LAB — ALT: ALT: 15 U/L (ref 0–35)

## 2022-04-27 ENCOUNTER — Ambulatory Visit: Payer: Medicare HMO | Admitting: Physical Therapy

## 2022-04-27 ENCOUNTER — Encounter: Payer: Medicare HMO | Admitting: Occupational Therapy

## 2022-04-27 ENCOUNTER — Encounter: Payer: Medicare HMO | Admitting: Speech Pathology

## 2022-04-29 ENCOUNTER — Encounter: Payer: Medicare HMO | Admitting: Speech Pathology

## 2022-04-29 ENCOUNTER — Ambulatory Visit: Payer: Medicare HMO | Admitting: Physical Therapy

## 2022-05-04 ENCOUNTER — Encounter: Payer: Medicare HMO | Admitting: Speech Pathology

## 2022-05-04 ENCOUNTER — Encounter: Payer: Medicare HMO | Admitting: Occupational Therapy

## 2022-05-04 ENCOUNTER — Ambulatory Visit: Payer: Medicare HMO | Admitting: Physical Therapy

## 2022-05-06 ENCOUNTER — Ambulatory Visit: Payer: Medicare HMO | Admitting: Physical Therapy

## 2022-05-06 ENCOUNTER — Encounter: Payer: Medicare HMO | Admitting: Speech Pathology

## 2022-05-11 ENCOUNTER — Encounter: Payer: Medicare HMO | Admitting: Occupational Therapy

## 2022-05-11 ENCOUNTER — Ambulatory Visit: Payer: Medicare HMO | Admitting: Physical Therapy

## 2022-05-11 ENCOUNTER — Encounter: Payer: Medicare HMO | Admitting: Speech Pathology

## 2022-05-13 ENCOUNTER — Encounter: Payer: Medicare HMO | Admitting: Occupational Therapy

## 2022-05-13 ENCOUNTER — Ambulatory Visit: Payer: Medicare HMO | Admitting: Physical Therapy

## 2022-05-13 ENCOUNTER — Encounter: Payer: Medicare HMO | Admitting: Speech Pathology

## 2022-05-18 ENCOUNTER — Encounter: Payer: Medicare HMO | Admitting: Speech Pathology

## 2022-05-18 ENCOUNTER — Encounter: Payer: Medicare HMO | Admitting: Occupational Therapy

## 2022-05-18 ENCOUNTER — Ambulatory Visit: Payer: Medicare HMO | Admitting: Physical Therapy

## 2022-05-20 ENCOUNTER — Ambulatory Visit: Payer: Medicare HMO | Admitting: Physical Therapy

## 2022-05-20 ENCOUNTER — Encounter: Payer: Medicare HMO | Admitting: Speech Pathology

## 2022-05-20 ENCOUNTER — Encounter: Payer: Medicare HMO | Admitting: Occupational Therapy

## 2022-05-25 ENCOUNTER — Ambulatory Visit: Payer: Medicare HMO

## 2022-05-25 ENCOUNTER — Encounter: Payer: Medicare HMO | Admitting: Speech Pathology

## 2022-05-25 ENCOUNTER — Ambulatory Visit: Payer: Medicare HMO | Admitting: Physical Therapy

## 2022-05-27 ENCOUNTER — Ambulatory Visit: Payer: Medicare HMO | Admitting: Physical Therapy

## 2022-05-27 ENCOUNTER — Encounter: Payer: Medicare HMO | Admitting: Speech Pathology

## 2022-05-27 ENCOUNTER — Encounter: Payer: Medicare HMO | Admitting: Occupational Therapy

## 2022-05-31 ENCOUNTER — Other Ambulatory Visit: Payer: Self-pay | Admitting: *Deleted

## 2022-05-31 MED ORDER — SIMVASTATIN 40 MG PO TABS: 40.0000 mg | ORAL_TABLET | Freq: Every day | ORAL | 1 refills | Status: DC

## 2022-06-01 ENCOUNTER — Encounter: Payer: Medicare HMO | Admitting: Occupational Therapy

## 2022-06-01 ENCOUNTER — Encounter: Payer: Medicare HMO | Admitting: Speech Pathology

## 2022-06-01 ENCOUNTER — Ambulatory Visit: Payer: Medicare HMO

## 2022-06-03 ENCOUNTER — Ambulatory Visit: Payer: Medicare HMO | Admitting: Physical Therapy

## 2022-06-03 ENCOUNTER — Encounter: Payer: Medicare HMO | Admitting: Speech Pathology

## 2022-06-03 ENCOUNTER — Encounter: Payer: Medicare HMO | Admitting: Occupational Therapy

## 2022-06-08 ENCOUNTER — Encounter: Payer: Medicare HMO | Admitting: Speech Pathology

## 2022-06-08 ENCOUNTER — Ambulatory Visit: Payer: Medicare HMO | Admitting: Physical Therapy

## 2022-06-10 ENCOUNTER — Encounter: Payer: Medicare HMO | Admitting: Occupational Therapy

## 2022-06-10 ENCOUNTER — Ambulatory Visit: Payer: Medicare HMO

## 2022-06-10 ENCOUNTER — Encounter: Payer: Medicare HMO | Admitting: Speech Pathology

## 2022-06-15 ENCOUNTER — Ambulatory Visit: Payer: Medicare HMO

## 2022-06-15 ENCOUNTER — Encounter: Payer: Medicare HMO | Admitting: Speech Pathology

## 2022-06-15 ENCOUNTER — Encounter: Payer: Medicare HMO | Admitting: Occupational Therapy

## 2022-06-22 ENCOUNTER — Encounter: Payer: Medicare HMO | Admitting: Speech Pathology

## 2022-06-22 ENCOUNTER — Ambulatory Visit: Payer: Medicare HMO

## 2022-06-22 ENCOUNTER — Encounter: Payer: Medicare HMO | Admitting: Occupational Therapy

## 2022-06-23 DIAGNOSIS — M533 Sacrococcygeal disorders, not elsewhere classified: Secondary | ICD-10-CM | POA: Diagnosis not present

## 2022-06-23 DIAGNOSIS — Z96651 Presence of right artificial knee joint: Secondary | ICD-10-CM | POA: Diagnosis not present

## 2022-06-23 DIAGNOSIS — M25551 Pain in right hip: Secondary | ICD-10-CM | POA: Diagnosis not present

## 2022-06-24 ENCOUNTER — Ambulatory Visit: Payer: Medicare HMO

## 2022-06-24 ENCOUNTER — Encounter: Payer: Medicare HMO | Admitting: Speech Pathology

## 2022-06-24 ENCOUNTER — Encounter: Payer: Medicare HMO | Admitting: Occupational Therapy

## 2022-06-28 DIAGNOSIS — Z6835 Body mass index (BMI) 35.0-35.9, adult: Secondary | ICD-10-CM | POA: Diagnosis not present

## 2022-06-28 DIAGNOSIS — I10 Essential (primary) hypertension: Secondary | ICD-10-CM | POA: Diagnosis not present

## 2022-06-28 DIAGNOSIS — E782 Mixed hyperlipidemia: Secondary | ICD-10-CM | POA: Diagnosis not present

## 2022-06-28 DIAGNOSIS — I2089 Other forms of angina pectoris: Secondary | ICD-10-CM | POA: Diagnosis not present

## 2022-06-28 DIAGNOSIS — R011 Cardiac murmur, unspecified: Secondary | ICD-10-CM | POA: Diagnosis not present

## 2022-06-28 DIAGNOSIS — R0602 Shortness of breath: Secondary | ICD-10-CM | POA: Diagnosis not present

## 2022-06-28 DIAGNOSIS — J45909 Unspecified asthma, uncomplicated: Secondary | ICD-10-CM | POA: Diagnosis not present

## 2022-06-28 DIAGNOSIS — R600 Localized edema: Secondary | ICD-10-CM | POA: Diagnosis not present

## 2022-06-29 ENCOUNTER — Ambulatory Visit: Payer: Medicare HMO

## 2022-06-29 ENCOUNTER — Encounter: Payer: Medicare HMO | Admitting: Occupational Therapy

## 2022-06-30 DIAGNOSIS — H0012 Chalazion right lower eyelid: Secondary | ICD-10-CM | POA: Diagnosis not present

## 2022-07-01 ENCOUNTER — Ambulatory Visit: Payer: Medicare HMO

## 2022-07-01 ENCOUNTER — Encounter: Payer: Medicare HMO | Admitting: Occupational Therapy

## 2022-07-01 DIAGNOSIS — M545 Low back pain, unspecified: Secondary | ICD-10-CM | POA: Diagnosis not present

## 2022-07-06 ENCOUNTER — Ambulatory Visit: Payer: Medicare HMO

## 2022-07-08 ENCOUNTER — Ambulatory Visit: Payer: Medicare HMO

## 2022-07-13 ENCOUNTER — Ambulatory Visit: Payer: Medicare HMO | Admitting: Physical Therapy

## 2022-07-15 ENCOUNTER — Ambulatory Visit: Payer: Medicare HMO

## 2022-07-20 ENCOUNTER — Ambulatory Visit: Payer: Medicare HMO | Admitting: Physical Therapy

## 2022-07-21 ENCOUNTER — Other Ambulatory Visit: Payer: Self-pay | Admitting: Family Medicine

## 2022-07-21 NOTE — Telephone Encounter (Signed)
Patient would like to know if its okay to use her inhaler that says to discard after 10/23 however the box says expire May of 2024? [place] and would like advice today.

## 2022-07-21 NOTE — Telephone Encounter (Signed)
I don't know what to tell her re: the discrepancy between instructions To be safe please send in another alb mdi to pharm of choice I pended it Thanks

## 2022-07-22 ENCOUNTER — Ambulatory Visit: Payer: Medicare HMO

## 2022-07-23 NOTE — Telephone Encounter (Signed)
Left VM requesting pt to call the office back 

## 2022-07-28 NOTE — Telephone Encounter (Signed)
Left VM asking pt if she still needs help with this to call us back.

## 2022-08-23 DIAGNOSIS — M545 Low back pain, unspecified: Secondary | ICD-10-CM | POA: Diagnosis not present

## 2022-09-24 DIAGNOSIS — Z6835 Body mass index (BMI) 35.0-35.9, adult: Secondary | ICD-10-CM | POA: Diagnosis not present

## 2022-09-24 DIAGNOSIS — E785 Hyperlipidemia, unspecified: Secondary | ICD-10-CM | POA: Diagnosis not present

## 2022-09-24 DIAGNOSIS — R4189 Other symptoms and signs involving cognitive functions and awareness: Secondary | ICD-10-CM | POA: Diagnosis not present

## 2022-09-24 DIAGNOSIS — Z8673 Personal history of transient ischemic attack (TIA), and cerebral infarction without residual deficits: Secondary | ICD-10-CM | POA: Diagnosis not present

## 2022-09-28 ENCOUNTER — Ambulatory Visit (INDEPENDENT_AMBULATORY_CARE_PROVIDER_SITE_OTHER): Payer: Medicare HMO | Admitting: Family

## 2022-09-28 ENCOUNTER — Encounter: Payer: Self-pay | Admitting: Family

## 2022-09-28 ENCOUNTER — Telehealth: Payer: Self-pay | Admitting: *Deleted

## 2022-09-28 VITALS — BP 124/82 | HR 81 | Temp 97.9°F | Ht 62.0 in | Wt 210.8 lb

## 2022-09-28 DIAGNOSIS — L239 Allergic contact dermatitis, unspecified cause: Secondary | ICD-10-CM | POA: Diagnosis not present

## 2022-09-28 DIAGNOSIS — L259 Unspecified contact dermatitis, unspecified cause: Secondary | ICD-10-CM | POA: Insufficient documentation

## 2022-09-28 MED ORDER — TRIAMCINOLONE ACETONIDE 0.025 % EX CREA: 1.0000 | TOPICAL_CREAM | Freq: Two times a day (BID) | CUTANEOUS | 1 refills | Status: DC

## 2022-09-28 NOTE — Telephone Encounter (Signed)
PLEASE NOTE: All timestamps contained within this report are represented as [place] Standard Time. CONFIDENTIALTY NOTICE: This fax transmission is intended only for the addressee. It contains information that is legally privileged, confidential or otherwise protected from use or disclosure. If you are not the intended recipient, you are strictly prohibited from reviewing, disclosing, copying using or disseminating any of this information or taking any action in reliance on or regarding this information. If you have received this fax in error, please notify us immediately by telephone so that we can arrange for its return to [place]. Phone: [phone], Toll-Free: [phone], Fax: [phone] Page: 1 of 2 Call Id: [id number] [facility] - Client TELEPHONE ADVICE RECORD [facility] Patient Name: Cheryl Joseph Gender: Female DOB: [birth year] Age: 82 Y 76 M 12 D Return Phone Number: [phone] (Primary) Address: City/ State/ Zip: [place]   [id number] Client [place] [facility] - Client Client Site [facility] Provider [place], Roque Lias - MD Contact Type Call Who Is Calling Patient / Member / Family / Caregiver Call Type Triage / Clinical Relationship To Patient Self Return Phone Number [phone] (Primary) Chief Complaint Rash - Widespread Reason for Call Symptomatic / Request for [place] states she has a itchy rash.Widespread Transferred from office. Has appt. at 11am Translation No Nurse Assessment Nurse: D'Heur Lucia Gaskins, RN, Adrienne Date/Time (Eastern Time): 09/28/2022 8:27:48 AM Confirm and document reason for call. If symptomatic, describe symptoms. ---Caller states she has a itchy rash. She noticed it first on Saturday evening. It started on her chin, now it's on her face and chest. It is not raised on her chest. The spot on her chin looks like an insect bite. The  rash above her left eye is red. Her lower lip feels swollen but doesn't look swollen. Does the patient have any new or worsening symptoms? ---Yes Will a triage be completed? ---Yes Related visit to physician within the last 2 weeks? ---No Does the PT have any chronic conditions? (i.e. diabetes, asthma, this includes High risk factors for pregnancy, etc.) ---Yes List chronic conditions. ---HTN, hx of stroke; high cholesterol. Is this a behavioral health or substance abuse call? ---No Guidelines Guideline Title Affirmed Question Affirmed Notes Nurse Date/Time (Eastern Time) Rash or Redness - Widespread Face becomes swollen D'Heur Lucia Gaskins, RN, Adrienne 09/28/2022 8:31:53 AM Disp. Time Eilene Ghazi Time) Disposition Final User 09/28/2022 8:39:20 AM See HCP within 4 Hours (or PCP triage) Yes D'Heur Lucia Gaskins, RN, Adrienne PLEASE NOTE: All timestamps contained within this report are represented as [place] Standard Time. CONFIDENTIALTY NOTICE: This fax transmission is intended only for the addressee. It contains information that is legally privileged, confidential or otherwise protected from use or disclosure. If you are not the intended recipient, you are strictly prohibited from reviewing, disclosing, copying using or disseminating any of this information or taking any action in reliance on or regarding this information. If you have received this fax in error, please notify us immediately by telephone so that we can arrange for its return to [place]. Phone: [phone], Toll-Free: [phone], Fax: [phone] Page: 2 of 2 Call Id: [id number] Final Disposition 09/28/2022 8:39:20 AM See HCP within 4 Hours (or PCP triage) Yes D'Heur Lucia Gaskins, RN, Ebbie Latus Disagree/Comply Comply Caller Understands Yes PreDisposition Call Doctor Care Advice Given Per Guideline SEE HCP (OR PCP TRIAGE) WITHIN 4 HOURS: * IF OFFICE WILL BE OPEN: You need to be seen within the next 3 or 4 hours. Call  your doctor (or  NP/PA) now or as soon as the office opens. CALL BACK IF: * You become worse CARE ADVICE given per Rash - Widespread and Cause Unknown (Adult) guideline. Referrals REFERRED TO PCP OFFICE

## 2022-09-28 NOTE — Telephone Encounter (Signed)
Patient scheduled to see Mable Paris FNP today 09/28/22 at [facility] at 11:00 am.

## 2022-09-28 NOTE — Assessment & Plan Note (Signed)
Patient nontoxic in appearance without systemic features.  Suspect contact dermatitis although etiology unknown.  Trial of triamcinolone with over-the-counter topical Benadryl.

## 2022-09-28 NOTE — Telephone Encounter (Signed)
Appreciate her help Aware, will watch for correspondence

## 2022-09-28 NOTE — Progress Notes (Signed)
Assessment & Plan:  Allergic contact dermatitis, unspecified trigger Assessment & Plan: Patient nontoxic in appearance without systemic features.  Suspect contact dermatitis although etiology unknown.  Trial of triamcinolone with over-the-counter topical Benadryl.  Orders: -     Triamcinolone Acetonide; Apply 1 Application topically 2 (two) times daily.  Dispense: 80 g; Refill: 1     Return precautions given.   Risks, benefits, and alternatives of the medications and treatment plan prescribed today were discussed, and patient expressed understanding.   Education regarding symptom management and diagnosis given to patient on AVS either electronically or printed.  Return if symptoms worsen or fail to improve.  Mable Paris, FNP  Subjective:    Patient ID: Cheryl Joseph, female    DOB: 07-24-1941, 82 y.o.   MRN: [id number]  CC: Cheryl Joseph is a 82 y.o. female who presents today for an acute visit.    HPI: She noticed red area on right side of chin 3 days ago. Itchy. Not painful.   She was sitting out side and she believes that 'something may had bit me'.   The following day she noticed above left eye  No vision changes, numbness of face, HA, fever, trounle swawllowing, tongue swollen.    She tried otc cortisone with relief of itching  No new medication.  Denies history of glaucoma    History of hypertension, allergies, osteopenia, rheumatoid arthritis, asthma No h/o CKD   Allergies: Atorvastatin, Rosuvastatin, Tolterodine tartrate, Vesicare [solifenacin succinate], and Zetia [ezetimibe] Current Outpatient Medications on File Prior to Visit  Medication Sig Dispense Refill   acetaminophen (TYLENOL) 500 MG tablet Take 1,000 mg by mouth every 6 (six) hours as needed for mild pain.     albuterol (VENTOLIN HFA) 108 (90 Base) MCG/ACT inhaler INHALE TWO PUFFS BY MOUTH EVERY 4 HOURS AS NEEDED FOR WHEEZING AND 2 PUFFS BEFORE EXERCISE OR EXPOSURE TO COLD AIR 18 each  2   amLODipine (NORVASC) 5 MG tablet Hold until followup with PCP. 90 tablet 3   aspirin EC 81 MG tablet Take 1 tablet (81 mg total) by mouth daily. Swallow whole.     b complex vitamins tablet Take 1 tablet by mouth every Monday, Wednesday, and Friday.     calcium carbonate (OS-CAL - DOSED IN MG OF ELEMENTAL CALCIUM) 1250 (500 Ca) MG tablet Take 2 tablets by mouth daily with breakfast.     Cholecalciferol (VITAMIN D3 PO) Take 2 tablets by mouth daily.     Hyaluronic Acid-Vitamin C (HYALURONIC ACID PO) Take 1 tablet by mouth every Monday, Wednesday, and Friday.     montelukast (SINGULAIR) 10 MG tablet Take 1 tablet (10 mg total) by mouth at bedtime. 90 tablet 3   Multiple Vitamin (MULTIVITAMIN) capsule Take 1 capsule by mouth daily. Reported on 12/18/2015     Omega-3 Fatty Acids (FISH OIL PO) Take 1 capsule by mouth daily. Reported on 12/18/2015     OVER THE COUNTER MEDICATION Take 2 capsules by mouth daily. BONE UP     oxybutynin (OXYTROL) 3.9 MG/24HR Place 1 patch onto the skin once a week. Sunday.  Home med.     Probiotic Product (PROBIOTIC DAILY PO) Take 1 tablet by mouth daily.     psyllium (METAMUCIL SMOOTH TEXTURE) 28 % packet Take 1 packet by mouth daily.     Pumpkin Seed-Soy Germ (AZO BLADDER CONTROL/GO-LESS PO) Take 1 capsule by mouth at bedtime.     simvastatin (ZOCOR) 40 MG tablet Take 1 tablet (40 mg  total) by mouth at bedtime. 90 tablet 1   TURMERIC PO Take 720 mg by mouth every Monday, Wednesday, and Friday.     Ubiquinol 100 MG CAPS Take 1 capsule by mouth every Monday, Wednesday, and Friday.     No current facility-administered medications on file prior to visit.    Review of Systems  Constitutional:  Negative for chills and fever.  Respiratory:  Negative for cough.   Cardiovascular:  Negative for chest pain and palpitations.  Gastrointestinal:  Negative for nausea and vomiting.  Skin:  Positive for rash.      Objective:    BP 124/82   Pulse 81   Temp 97.9 F (36.6  C) (Oral)   Ht '5\' 2"'$  (1.575 m)   Wt 210 lb 12.8 oz (95.6 kg)   SpO2 95%   BMI 38.56 kg/m   BP Readings from Last 3 Encounters:  09/28/22 124/82  02/25/22 110/80  01/29/22 120/70   Wt Readings from Last 3 Encounters:  09/28/22 210 lb 12.8 oz (95.6 kg)  02/25/22 201 lb 3.2 oz (91.3 kg)  01/29/22 200 lb (90.7 kg)    Physical Exam Vitals reviewed.  Constitutional:      Appearance: She is well-developed.  Eyes:     Conjunctiva/sclera: Conjunctivae normal.  Cardiovascular:     Rate and Rhythm: Normal rate and regular rhythm.     Pulses: Normal pulses.     Heart sounds: Normal heart sounds.  Pulmonary:     Effort: Pulmonary effort is normal.     Breath sounds: Normal breath sounds. No wheezing, rhonchi or rales.  Skin:    General: Skin is warm and dry.          Comments: Grouped papular lesions, vesicular , crusty right chin.  Underneath and above left eye erythematous blotchy appearance.  No vesicular lesion seen  Neurological:     Mental Status: She is alert.  Psychiatric:        Speech: Speech normal.        Behavior: Behavior normal.        Thought Content: Thought content normal.

## 2022-09-28 NOTE — Patient Instructions (Addendum)
Trial of topical cortisone as I suspect this is an allergic reaction, although unsure of culprit.  Please be diligent with handwashing so to prevent further spreading.  You may also purchase over-the-counter topical Benadryl.  This will likely be less sedating than oral Benadryl  Contact Dermatitis Dermatitis is redness, soreness, and swelling (inflammation) of the skin. Contact dermatitis is a reaction to certain substances that touch the skin. There are two types of this condition: Irritant contact dermatitis. This is the most common type. It happens when something irritates your skin, such as when your hands get dry from washing them too often with soap. You can get this type of reaction even if you have not been exposed to the irritant before. Allergic contact dermatitis. This type is caused by a substance that you are allergic to, such as poison ivy. It occurs when you have been exposed to the substance (allergen) and form a sensitivity to it. In some cases, the reaction may start soon after your first exposure to the allergen. In other cases, it may not start until you are exposed to the allergen again. It may then occur every time you are exposed to the allergen in the future. What are the causes? Irritant contact dermatitis is often caused by exposure to: Makeup. Soaps, detergents, and bleaches. Acids. Metal salts, such as nickel. Allergic contact dermatitis is often caused by exposure to: Poisonous plants. Chemicals. Jewelry. Latex. Medicines. Preservatives in products, such as clothes. What increases the risk? You are more likely to get this condition if you have: A job that exposes you to irritants or allergens. Certain medical conditions. These include asthma and eczema. What are the signs or symptoms? Symptoms of this condition may occur in any place on your body that has been touched by the irritant. Symptoms include: Dryness, flaking, or cracking. Redness. Itching. Pain or  a burning feeling. Blisters. Drainage of small amounts of blood or clear fluid from skin cracks. With allergic contact dermatitis, there may also be swelling in areas such as the eyelids, mouth, or genitals. How is this diagnosed? This condition is diagnosed with a medical history and physical exam. A patch skin test may be done to help figure out the cause. If the condition is related to your job, you may need to see an expert in health problems in the workplace (occupational medicine specialist). How is this treated? This condition is treated by staying away from the cause of the reaction and protecting your skin from further contact. Treatment may also include: Steroid creams or ointments. Steroid medicines may need be taken by mouth (orally) in more severe cases. Antibiotics or medicines applied to the skin to kill bacteria (antibacterial ointments). These may be needed if a skin infection is present. Antihistamines. These may be taken orally or put on as a lotion to ease itching. A bandage (dressing). Follow these instructions at home: Skin care Moisturize your skin as needed. Put cool, wet cloths (cool compresses) on the affected areas. Try applying baking soda paste to your skin. Stir water into baking soda until it has the consistency of a paste. Do not scratch your skin. Avoid friction to the affected area. Avoid the use of soaps, perfumes, and dyes. Check the affected areas every day for signs of infection. Check for: More redness, swelling, or pain. More fluid or blood. Warmth. Pus or a bad smell. Medicines Take or apply over-the-counter and prescription medicines only as told by your health care provider. If you were prescribed antibiotics,  take or apply them as told by your health care provider. Do not stop using the antibiotic even if you start to feel better. Bathing Try taking a bath with: Epsom salts. Follow the instructions on the packaging. You can get these at your  local pharmacy or grocery store. Baking soda. Pour a small amount into the bath as told by your health care provider. Colloidal oatmeal. Follow the instructions on the packaging. You can get this at your local pharmacy or grocery store. Bathe less often. This may mean bathing every other day. Bathe in lukewarm water. Avoid using hot water. Bandage care If you were given a dressing, change it as told by your health care provider. Wash your hands with soap and water for at least 20 seconds before and after you change your dressing. If soap and water are not available, use hand sanitizer. General instructions Avoid the substance that caused your reaction. If you do not know what caused it, keep a journal to try to track what caused it. Write down: What you eat and drink. What cosmetics you use. What you wear in the affected area. This includes jewelry. Contact a health care provider if: Your condition does not get better with treatment. Your condition gets worse. You have any signs of infection. You have a fever. You have new symptoms. Your bone or joint under the affected area becomes painful after the skin has healed. Get help right away if: You notice red streaks coming from the affected area. The affected area turns darker. You have trouble breathing. This information is not intended to replace advice given to you by your health care provider. Make sure you discuss any questions you have with your health care provider. Document Revised: 01/15/2022 Document Reviewed: 01/15/2022 Elsevier Patient Education  [employer].

## 2022-10-25 ENCOUNTER — Ambulatory Visit (INDEPENDENT_AMBULATORY_CARE_PROVIDER_SITE_OTHER): Payer: Medicare HMO

## 2022-10-25 VITALS — Ht 62.5 in | Wt 201.0 lb

## 2022-10-25 DIAGNOSIS — Z0000 Encounter for general adult medical examination without abnormal findings: Secondary | ICD-10-CM

## 2022-10-25 NOTE — Progress Notes (Signed)
I connected with  Russ Halo on 10/25/22 by a audio enabled telemedicine application and verified that I am speaking with the correct person using two identifiers.  Patient Location: Home  Provider Location: Office/Clinic  I discussed the limitations of evaluation and management by telemedicine. The patient expressed understanding and agreed to proceed.  Subjective:   Cheryl Joseph is a 82 y.o. female who presents for Medicare Annual (Subsequent) preventive examination.  Review of Systems    Cardiac Risk Factors include: advanced age (>109men, >67 women);dyslipidemia;hypertension;obesity (BMI >30kg/m2)     Objective:    Today's Vitals   10/25/22 0957  Weight: 201 lb (91.2 kg)  Height: 5' 2.5" (1.588 m)   Body mass index is 36.18 kg/m.     10/25/2022   10:08 AM 02/15/2022    9:04 AM 01/27/2022   10:20 AM 12/31/2021   10:40 PM 12/31/2021   12:01 PM 10/21/2021   11:21 AM 06/10/2021    6:15 AM  Advanced Directives  Does Patient Have a Medical Advance Directive? No No No No No No No  Would patient like information on creating a medical advance directive?  No - Patient declined  No - Patient declined  No - Patient declined     Current Medications (verified) Outpatient Encounter Medications as of 10/25/2022  Medication Sig   acetaminophen (TYLENOL) 500 MG tablet Take 1,000 mg by mouth every 6 (six) hours as needed for mild pain.   albuterol (VENTOLIN HFA) 108 (90 Base) MCG/ACT inhaler INHALE TWO PUFFS BY MOUTH EVERY 4 HOURS AS NEEDED FOR WHEEZING AND 2 PUFFS BEFORE EXERCISE OR EXPOSURE TO COLD AIR   amLODipine (NORVASC) 5 MG tablet Hold until followup with PCP.   aspirin EC 81 MG tablet Take 1 tablet (81 mg total) by mouth daily. Swallow whole.   b complex vitamins tablet Take 1 tablet by mouth every Monday, Wednesday, and Friday.   calcium carbonate (OS-CAL - DOSED IN MG OF ELEMENTAL CALCIUM) 1250 (500 Ca) MG tablet Take 2 tablets by mouth daily with breakfast.    Cholecalciferol (VITAMIN D3 PO) Take 2 tablets by mouth daily.   Hyaluronic Acid-Vitamin C (HYALURONIC ACID PO) Take 1 tablet by mouth every Monday, Wednesday, and Friday.   montelukast (SINGULAIR) 10 MG tablet Take 1 tablet (10 mg total) by mouth at bedtime.   Multiple Vitamin (MULTIVITAMIN) capsule Take 1 capsule by mouth daily. Reported on 12/18/2015   Omega-3 Fatty Acids (FISH OIL PO) Take 1 capsule by mouth daily. Reported on 12/18/2015   OVER THE COUNTER MEDICATION Take 2 capsules by mouth daily. BONE UP   oxybutynin (OXYTROL) 3.9 MG/24HR Place 1 patch onto the skin once a week. Sunday.  Home med.   Probiotic Product (PROBIOTIC DAILY PO) Take 1 tablet by mouth daily.   psyllium (METAMUCIL SMOOTH TEXTURE) 28 % packet Take 1 packet by mouth daily.   Pumpkin Seed-Soy Germ (AZO BLADDER CONTROL/GO-LESS PO) Take 1 capsule by mouth at bedtime.   rosuvastatin (CRESTOR) 20 MG tablet Take 20 mg by mouth daily. Takes 3 days per week   simvastatin (ZOCOR) 40 MG tablet Take 1 tablet (40 mg total) by mouth at bedtime.   triamcinolone (KENALOG) 0.025 % cream Apply 1 Application topically 2 (two) times daily.   TURMERIC PO Take 720 mg by mouth every Monday, Wednesday, and Friday.   Ubiquinol 100 MG CAPS Take 1 capsule by mouth every Monday, Wednesday, and Friday.   No facility-administered encounter medications on file as of 10/25/2022.  Allergies (verified) Atorvastatin, Rosuvastatin, Tolterodine tartrate, Vesicare [solifenacin succinate], and Zetia [ezetimibe]   History: Past Medical History:  Diagnosis Date   Anginal pain    Arthritis    RA hands(seronegative)   Asthma    Back pain    Cancer    skin   Cataract    HPV in female 1996   HPV with colposcopy (all neg paps since)   Hyperlipidemia    Hypertension    Incarcerated ventral hernia    Obesity    Osteopenia    Pre-diabetes    Shortness of breath dyspnea    Shoulder pain    Past Surgical History:  Procedure Laterality Date    CATARACT EXTRACTION, BILATERAL Bilateral    COLONOSCOPY  06/01/2004   COLONOSCOPY WITH PROPOFOL N/A 05/01/2015   Procedure: COLONOSCOPY WITH PROPOFOL;  Surgeon: Manya Silvas, MD;  Location: [facility] ENDOSCOPY;  Service: Endoscopy;  Laterality: N/A;   CORRECTION HAMMER TOE Right    2nd toe   JOINT REPLACEMENT Right    right knee replacement January 2021   SQUAMOUS CELL CARCINOMA EXCISION Left 12/2017   left arm   TONSILLECTOMY     VAGINAL HYSTERECTOMY  2015   with pelvic organ prolapse surgery    XI ROBOTIC ASSISTED VENTRAL HERNIA N/A 06/10/2021   Procedure: XI ROBOTIC ASSISTED VENTRAL HERNIA;  Surgeon: Herbert Pun, MD;  Location: [facility] ORS;  Service: General;  Laterality: N/A;   Family History  Problem Relation Age of Onset   Diabetes Mother    Hyperlipidemia Mother    Heart disease Mother    Hypertension Mother    Obesity Mother    Kyphosis Mother    Heart disease Father    Hypertension Father    Cancer Father        lung Cancer smoker   Diabetes Daughter    Kyphosis Sister    Breast cancer Neg Hx    Social History   Socioeconomic History   Marital status: Married    Spouse name: Nada Boozer   Number of children: 3   Years of education: Not on file   Highest education level: Not on file  Occupational History   Not on file  Tobacco Use   Smoking status: Never   Smokeless tobacco: Never  Vaping Use   Vaping Use: Never used  Substance and Sexual Activity   Alcohol use: No    Alcohol/week: 0.0 standard drinks of alcohol   Drug use: No   Sexual activity: Yes  Other Topics Concern   Not on file  Social History Narrative   Remarried.   19 grandchildren between pt and husband.   7 great grandchildren.   Social Determinants of Health   Financial Resource Strain: Low Risk  (10/25/2022)   Overall Financial Resource Strain (CARDIA)    Difficulty of Paying Living Expenses: Not hard at all  Food Insecurity: No Food Insecurity (10/25/2022)   Hunger Vital Sign     Worried About Running Out of Food in the Last Year: Never true    Ran Out of Food in the Last Year: Never true  Transportation Needs: No Transportation Needs (10/25/2022)   PRAPARE - Hydrologist (Medical): No    Lack of Transportation (Non-Medical): No  Physical Activity: Sufficiently Active (10/25/2022)   Exercise Vital Sign    Days of Exercise per Week: 5 days    Minutes of Exercise per Session: 30 min  Stress: No Stress Concern Present (10/25/2022)  [place] Questionnaire    Feeling of Stress : Not at all  Social Connections: Socially Integrated (10/25/2022)   Social Connection and Isolation Panel [NHANES]    Frequency of Communication with Friends and Family: More than three times a week    Frequency of Social Gatherings with Friends and Family: More than three times a week    Attends Religious Services: More than 4 times per year    Active Member of [employer] or Organizations: Yes    Attends Archivist Meetings: 1 to 4 times per year    Marital Status: Married    Tobacco Counseling Counseling given: Not Answered   Clinical Intake:  Pre-visit preparation completed: Yes  Pain : No/denies pain     BMI - recorded: 36.18 Nutritional Status: BMI > 30  Obese Nutritional Risks: None Diabetes: No  How often do you need to have someone help you when you read instructions, pamphlets, or other written materials from your doctor or pharmacy?: 1 - Never  Diabetic?no  Interpreter Needed?: No  Comments: lives with husband Information entered by :: B.Tallen Schnorr,LPN   Activities of Daily Living    10/25/2022   10:08 AM 12/31/2021   10:40 PM  In your present state of health, do you have any difficulty performing the following activities:  Hearing? 0 0  Vision? 0 0  Difficulty concentrating or making decisions? 0 0  Walking or climbing stairs? 1 0  Dressing or bathing? 0 0  Doing errands,  shopping? 0 0  Preparing Food and eating ? N   Using the Toilet? N   In the past six months, have you accidently leaked urine? Y   Do you have problems with loss of bowel control? N   Managing your Medications? N   Managing your Finances? N   Housekeeping or managing your Housekeeping? N     Patient Care Team: Tower, Wynelle Fanny, MD as PCP - General Birder Robson, MD as Referring Physician (Ophthalmology) Garrel Ridgel, DPM as Consulting Physician (Podiatry) Yolonda Kida, MD as Consulting Physician (Cardiology)  Indicate any recent Medical Services you may have received from other than [facility] providers in the past year (date may be approximate).     Assessment:   This is a routine wellness examination for Anissa.  Hearing/Vision screen Hearing Screening - Comments:: Adequate hearing Vision Screening - Comments:: Adequate vision w/glasses Dr Carolan Clines [facility]  Dietary issues and exercise activities discussed: Current Exercise Habits: Home exercise routine, Type of exercise: walking (exercise bike), Time (Minutes): 30, Frequency (Times/Week): 5, Weekly Exercise (Minutes/Week): 150, Intensity: Mild, Exercise limited by: orthopedic condition(s)   Goals Addressed   None    Depression Screen    10/25/2022   10:06 AM 09/28/2022   11:03 AM 02/25/2022    9:04 AM 11/02/2021   10:01 AM 10/21/2021   11:18 AM 02/27/2021    9:23 AM 10/10/2020    8:30 AM  PHQ 2/9 Scores  PHQ - 2 Score 0 0 0 0 0 0 0  PHQ- 9 Score       0    Fall Risk    10/25/2022   10:00 AM 09/28/2022   11:03 AM 02/25/2022    9:04 AM 11/02/2021   10:00 AM 10/21/2021   11:24 AM  Fall Risk   Falls in the past year? 0 0 0 0 0  Number falls in past yr: 0 0  0 0  Injury with Fall?  0 0  0 0  Risk for fall due to : No Fall Risks;Impaired balance/gait No Fall Risks   History of fall(s);No Fall Risks  Risk for fall due to: Comment off balance at times      Follow up Education provided;Falls prevention discussed Falls  evaluation completed Falls evaluation completed  Falls prevention discussed    FALL RISK PREVENTION PERTAINING TO THE HOME:  Any stairs in or around the home? Yes  If so, are there any without handrails? Yes  Home free of loose throw rugs in walkways, pet beds, electrical cords, etc? Yes  Adequate lighting in your home to reduce risk of falls? Yes   ASSISTIVE DEVICES UTILIZED TO PREVENT FALLS:  Life alert? No  Use of a cane, walker or w/c? Yes  Grab bars in the bathroom? Yes  Shower chair or bench in shower? Yes  Elevated toilet seat or a handicapped toilet? Yes   Cognitive Function:    10/10/2020    8:33 AM 09/14/2019    9:40 AM 07/31/2018    8:32 AM 06/01/2017   10:17 AM 05/31/2016   10:24 AM  MMSE - Mini Mental State Exam  Orientation to time 5 5 5 5 5   Orientation to Place 5 5 5 5 5   Registration 3 3 3 3 3   Attention/ Calculation 5 5 0 0 0  Recall 3 3 3 3 3   Language- name 2 objects   0 0 0  Language- repeat 1 1 1 1 1   Language- follow 3 step command   3 3 3   Language- read & follow direction   0 0 0  Write a sentence   0 0 0  Copy design   0 0 0  Total score   20 20 20         10/25/2022   10:10 AM  6CIT Screen  What Year? 0 points  What month? 0 points  What time? 0 points  Count back from 20 0 points  Months in reverse 0 points  Repeat phrase 0 points  Total Score 0 points    Immunizations Immunization History  Administered Date(s) Administered   Fluad Quad(high Dose 65+) 04/26/2019, 05/01/2020, 04/23/2022   Influenza Split 05/27/2011, 05/01/2012   Influenza Whole 04/27/2007, 05/23/2008, 05/14/2009, 05/05/2010   Influenza,inj,Quad PF,6+ Mos 05/21/2013, 04/26/2014, 05/09/2015, 05/19/2016, 04/26/2017, 05/18/2018   Influenza,inj,quad, With Preservative 04/26/2016   PFIZER(Purple Top)SARS-COV-2 Vaccination 09/24/2019, 10/15/2019, 05/14/2020   Pneumococcal Conjugate-13 08/19/2014   Pneumococcal Polysaccharide-23 05/31/2005   Pneumococcal-Unspecified  04/28/2015   Td 11/24/1998, 05/14/2009   Zoster, Live 03/05/2008    TDAP status: Up to date  Flu Vaccine status: Up to date  Pneumococcal vaccine status: Up to date  Covid-19 vaccine status: Completed vaccines  Qualifies for Shingles Vaccine? Yes   Zostavax completed No   Shingrix Completed?: No.    Education has been provided regarding the importance of this vaccine. Patient has been advised to call insurance company to determine out of pocket expense if they have not yet received this vaccine. Advised may also receive vaccine at local pharmacy or Health Dept. Verbalized acceptance and understanding.  Screening Tests Health Maintenance  Topic Date Due   Diabetic kidney evaluation - Urine ACR  Never done   DTaP/Tdap/Td (3 - Tdap) 05/15/2019   OPHTHALMOLOGY EXAM  12/01/2021   COVID-19 Vaccine (4 - 2023-24 season) 03/26/2022   HEMOGLOBIN A1C  08/28/2022   FOOT EXAM  10/25/2022 (Originally 08/05/2018)   Zoster Vaccines- Shingrix (1 of 2)  10/25/2022 (Originally 01/14/1990)   MAMMOGRAM  01/06/2023   Diabetic kidney evaluation - eGFR measurement  01/30/2023   INFLUENZA VACCINE  02/24/2023   Medicare Annual Wellness (AWV)  10/25/2023   Pneumonia Vaccine 34+ Years old  Completed   DEXA SCAN  Completed   HPV VACCINES  Aged Out    Health Maintenance  Health Maintenance Due  Topic Date Due   Diabetic kidney evaluation - Urine ACR  Never done   DTaP/Tdap/Td (3 - Tdap) 05/15/2019   OPHTHALMOLOGY EXAM  12/01/2021   COVID-19 Vaccine (4 - 2023-24 season) 03/26/2022   HEMOGLOBIN A1C  08/28/2022    Colorectal cancer screening: No longer required.   Mammogram status: No longer required due to age.  Bone Density status: Completed yes. Results reflect: Bone density results: OSTEOPENIA. Repeat every 5 years.  Lung Cancer Screening: (Low Dose CT Chest recommended if Age 15-80 years, 30 pack-year currently smoking OR have quit w/in 15years.) does not qualify.   Lung Cancer Screening  Referral: no  Additional Screening:  Hepatitis C Screening: does not qualify; Completed yes  Vision Screening: Recommended annual ophthalmology exams for early detection of glaucoma and other disorders of the eye. Is the patient up to date with their annual eye exam?  Yes  Who is the provider or what is the name of the office in which the patient attends annual eye exams? [facility] If pt is not established with a provider, would they like to be referred to a provider to establish care? Yes .   Dental Screening: Recommended annual dental exams for proper oral hygiene  Community Resource Referral / Chronic Care Management: CRR required this visit?  No   CCM required this visit?  No      Plan:     I have personally reviewed and noted the following in the patient's chart:   Medical and social history Use of alcohol, tobacco or illicit drugs  Current medications and supplements including opioid prescriptions. Patient is not currently taking opioid prescriptions. Functional ability and status Nutritional status Physical activity Advanced directives List of other physicians Hospitalizations, surgeries, and ER visits in previous 12 months Vitals Screenings to include cognitive, depression, and falls Referrals and appointments  In addition, I have reviewed and discussed with patient certain preventive protocols, quality metrics, and best practice recommendations. A written personalized care plan for preventive services as well as general preventive health recommendations were provided to patient.     Roger Shelter, LPN   [id number]   Nurse Notes: The patient states they are doing well and has no concerns or questions at this time. She relays her wanting to know her lab results and how to navigate with cholesterol medications

## 2022-10-25 NOTE — Patient Instructions (Signed)
Ms. Bruun , Thank you for taking time to come for your Medicare Wellness Visit. I appreciate your ongoing commitment to your health goals. Please review the following plan we discussed and let me know if I can assist you in the future.   These are the goals we discussed:  Goals      Increase physical activity     Starting 07/31/2018, I will continue to exercise on stationary bike for 20 minutes daily and to walk or do strength training for 10-15 minutes 2 days per week.      Patient Stated     09/14/2019, I will continue to exercise on stationary bike for 20 minutes daily and to walk or do strength training for 10-15 minutes.     Patient Stated     10/10/2020, I will my ride stationary bike for about 20 minutes and walk 10 minutes everyday.        This is a list of the screening recommended for you and due dates:  Health Maintenance  Topic Date Due   Yearly kidney health urinalysis for diabetes  Never done   DTaP/Tdap/Td vaccine (3 - Tdap) 05/15/2019   Eye exam for diabetics  12/01/2021   COVID-19 Vaccine (4 - 2023-24 season) 03/26/2022   Hemoglobin A1C  08/28/2022   Complete foot exam   10/25/2022*   Zoster (Shingles) Vaccine (1 of 2) 10/25/2022*   Mammogram  01/06/2023   Yearly kidney function blood test for diabetes  01/30/2023   Flu Shot  02/24/2023   Medicare Annual Wellness Visit  10/25/2023   Pneumonia Vaccine  Completed   DEXA scan (bone density measurement)  Completed   HPV Vaccine  Aged Out  *Topic was postponed. The date shown is not the original due date.    Advanced directives: no  Conditions/risks identified: none  Next appointment: Follow up in one year for your annual wellness visit 10/27/2023 @9am  telephone   Preventive Care 65 Years and Older, Female Preventive care refers to lifestyle choices and visits with your health care provider that can promote health and wellness. What does preventive care include? A yearly physical exam. This is also called an  annual well check. Dental exams once or twice a year. Routine eye exams. Ask your health care provider how often you should have your eyes checked. Personal lifestyle choices, including: Daily care of your teeth and gums. Regular physical activity. Eating a healthy diet. Avoiding tobacco and drug use. Limiting alcohol use. Practicing safe sex. Taking low-dose aspirin every day. Taking vitamin and mineral supplements as recommended by your health care provider. What happens during an annual well check? The services and screenings done by your health care provider during your annual well check will depend on your age, overall health, lifestyle risk factors, and family history of disease. Counseling  Your health care provider may ask you questions about your: Alcohol use. Tobacco use. Drug use. Emotional well-being. Home and relationship well-being. Sexual activity. Eating habits. History of falls. Memory and ability to understand (cognition). Work and work Statistician. Reproductive health. Screening  You may have the following tests or measurements: Height, weight, and BMI. Blood pressure. Lipid and cholesterol levels. These may be checked every 5 years, or more frequently if you are over 33 years old. Skin check. Lung cancer screening. You may have this screening every year starting at age 29 if you have a 30-pack-year history of smoking and currently smoke or have quit within the past 15 years. Fecal occult blood  test (FOBT) of the stool. You may have this test every year starting at age 63. Flexible sigmoidoscopy or colonoscopy. You may have a sigmoidoscopy every 5 years or a colonoscopy every 10 years starting at age 33. Hepatitis C blood test. Hepatitis B blood test. Sexually transmitted disease (STD) testing. Diabetes screening. This is done by checking your blood sugar (glucose) after you have not eaten for a while (fasting). You may have this done every 1-3 years. Bone  density scan. This is done to screen for osteoporosis. You may have this done starting at age 55. Mammogram. This may be done every 1-2 years. Talk to your health care provider about how often you should have regular mammograms. Talk with your health care provider about your test results, treatment options, and if necessary, the need for more tests. Vaccines  Your health care provider may recommend certain vaccines, such as: Influenza vaccine. This is recommended every year. Tetanus, diphtheria, and acellular pertussis (Tdap, Td) vaccine. You may need a Td booster every 10 years. Zoster vaccine. You may need this after age 59. Pneumococcal 13-valent conjugate (PCV13) vaccine. One dose is recommended after age 31. Pneumococcal polysaccharide (PPSV23) vaccine. One dose is recommended after age 54. Talk to your health care provider about which screenings and vaccines you need and how often you need them. This information is not intended to replace advice given to you by your health care provider. Make sure you discuss any questions you have with your health care provider. Document Released: 08/08/2015 Document Revised: 03/31/2016 Document Reviewed: 05/13/2015 Elsevier Interactive Patient Education  2017 [place] Prevention in the Home Falls can cause injuries. They can happen to people of all ages. There are many things you can do to make your home safe and to help prevent falls. What can I do on the outside of my home? Regularly fix the edges of walkways and driveways and fix any cracks. Remove anything that might make you trip as you walk through a door, such as a raised step or threshold. Trim any bushes or trees on the path to your home. Use bright outdoor lighting. Clear any walking paths of anything that might make someone trip, such as rocks or tools. Regularly check to see if handrails are loose or broken. Make sure that both sides of any steps have handrails. Any raised  decks and porches should have guardrails on the edges. Have any leaves, snow, or ice cleared regularly. Use sand or salt on walking paths during winter. Clean up any spills in your garage right away. This includes oil or grease spills. What can I do in the bathroom? Use night lights. Install grab bars by the toilet and in the tub and shower. Do not use towel bars as grab bars. Use non-skid mats or decals in the tub or shower. If you need to sit down in the shower, use a plastic, non-slip stool. Keep the floor dry. Clean up any water that spills on the floor as soon as it happens. Remove soap buildup in the tub or shower regularly. Attach bath mats securely with double-sided non-slip rug tape. Do not have throw rugs and other things on the floor that can make you trip. What can I do in the bedroom? Use night lights. Make sure that you have a light by your bed that is easy to reach. Do not use any sheets or blankets that are too big for your bed. They should not hang down onto the floor. Have  a firm chair that has side arms. You can use this for support while you get dressed. Do not have throw rugs and other things on the floor that can make you trip. What can I do in the kitchen? Clean up any spills right away. Avoid walking on wet floors. Keep items that you use a lot in easy-to-reach places. If you need to reach something above you, use a strong step stool that has a grab bar. Keep electrical cords out of the way. Do not use floor polish or wax that makes floors slippery. If you must use wax, use non-skid floor wax. Do not have throw rugs and other things on the floor that can make you trip. What can I do with my stairs? Do not leave any items on the stairs. Make sure that there are handrails on both sides of the stairs and use them. Fix handrails that are broken or loose. Make sure that handrails are as long as the stairways. Check any carpeting to make sure that it is firmly attached  to the stairs. Fix any carpet that is loose or worn. Avoid having throw rugs at the top or bottom of the stairs. If you do have throw rugs, attach them to the floor with carpet tape. Make sure that you have a light switch at the top of the stairs and the bottom of the stairs. If you do not have them, ask someone to add them for you. What else can I do to help prevent falls? Wear shoes that: Do not have high heels. Have rubber bottoms. Are comfortable and fit you well. Are closed at the toe. Do not wear sandals. If you use a stepladder: Make sure that it is fully opened. Do not climb a closed stepladder. Make sure that both sides of the stepladder are locked into place. Ask someone to hold it for you, if possible. Clearly mark and make sure that you can see: Any grab bars or handrails. First and last steps. Where the edge of each step is. Use tools that help you move around (mobility aids) if they are needed. These include: Canes. Walkers. Scooters. Crutches. Turn on the lights when you go into a dark area. Replace any light bulbs as soon as they burn out. Set up your furniture so you have a clear path. Avoid moving your furniture around. If any of your floors are uneven, fix them. If there are any pets around you, be aware of where they are. Review your medicines with your doctor. Some medicines can make you feel dizzy. This can increase your chance of falling. Ask your doctor what other things that you can do to help prevent falls. This information is not intended to replace advice given to you by your health care provider. Make sure you discuss any questions you have with your health care provider. Document Released: 05/08/2009 Document Revised: 12/18/2015 Document Reviewed: 08/16/2014 Elsevier Interactive Patient Education  2017 [employer].

## 2022-10-28 ENCOUNTER — Telehealth: Payer: Self-pay | Admitting: Family Medicine

## 2022-10-28 ENCOUNTER — Other Ambulatory Visit: Payer: Self-pay | Admitting: Family Medicine

## 2022-10-28 DIAGNOSIS — E785 Hyperlipidemia, unspecified: Secondary | ICD-10-CM

## 2022-10-28 DIAGNOSIS — R7303 Prediabetes: Secondary | ICD-10-CM

## 2022-10-28 DIAGNOSIS — I10 Essential (primary) hypertension: Secondary | ICD-10-CM

## 2022-10-28 NOTE — Telephone Encounter (Signed)
-----   Message from Ellamae Sia sent at 10/13/2022 12:53 PM EDT ----- Regarding: Lab orders for Friday, 4.5.24 Patient is scheduled for CPX labs, please order future labs, Thanks , Karna Christmas

## 2022-10-29 ENCOUNTER — Other Ambulatory Visit (INDEPENDENT_AMBULATORY_CARE_PROVIDER_SITE_OTHER): Payer: Medicare HMO

## 2022-10-29 DIAGNOSIS — E785 Hyperlipidemia, unspecified: Secondary | ICD-10-CM

## 2022-10-29 DIAGNOSIS — R7303 Prediabetes: Secondary | ICD-10-CM

## 2022-10-29 DIAGNOSIS — I10 Essential (primary) hypertension: Secondary | ICD-10-CM | POA: Diagnosis not present

## 2022-10-29 LAB — CBC WITH DIFFERENTIAL/PLATELET
Basophils Absolute: 0.1 10*3/uL (ref 0.0–0.1)
Basophils Relative: 1.2 % (ref 0.0–3.0)
Eosinophils Absolute: 0.2 10*3/uL (ref 0.0–0.7)
Eosinophils Relative: 3.7 % (ref 0.0–5.0)
HCT: 45.3 % (ref 36.0–46.0)
Hemoglobin: 15.3 g/dL — ABNORMAL HIGH (ref 12.0–15.0)
Lymphocytes Relative: 36 % (ref 12.0–46.0)
Lymphs Abs: 1.9 10*3/uL (ref 0.7–4.0)
MCHC: 33.7 g/dL (ref 30.0–36.0)
MCV: 95.5 fl (ref 78.0–100.0)
Monocytes Absolute: 0.6 10*3/uL (ref 0.1–1.0)
Monocytes Relative: 10.9 % (ref 3.0–12.0)
Neutro Abs: 2.6 10*3/uL (ref 1.4–7.7)
Neutrophils Relative %: 48.2 % (ref 43.0–77.0)
Platelets: 238 10*3/uL (ref 150.0–400.0)
RBC: 4.74 Mil/uL (ref 3.87–5.11)
RDW: 13.3 % (ref 11.5–15.5)
WBC: 5.4 10*3/uL (ref 4.0–10.5)

## 2022-10-29 LAB — COMPREHENSIVE METABOLIC PANEL
ALT: 11 U/L (ref 0–35)
AST: 18 U/L (ref 0–37)
Albumin: 3.8 g/dL (ref 3.5–5.2)
Alkaline Phosphatase: 90 U/L (ref 39–117)
BUN: 17 mg/dL (ref 6–23)
CO2: 30 mEq/L (ref 19–32)
Calcium: 9.1 mg/dL (ref 8.4–10.5)
Chloride: 106 mEq/L (ref 96–112)
Creatinine, Ser: 0.84 mg/dL (ref 0.40–1.20)
GFR: 64.55 mL/min (ref >60.00)
Glucose, Bld: 121 mg/dL — ABNORMAL HIGH (ref 70–99)
Potassium: 4.7 mEq/L (ref 3.5–5.1)
Sodium: 143 mEq/L (ref 135–145)
Total Bilirubin: 0.6 mg/dL (ref 0.2–1.2)
Total Protein: 6.2 g/dL (ref 6.0–8.3)

## 2022-10-29 LAB — TSH: TSH: 2.69 u[IU]/mL (ref 0.35–5.50)

## 2022-10-29 LAB — LIPID PANEL
Cholesterol: 168 mg/dL (ref 0–200)
HDL: 47.5 mg/dL (ref >39.00)
LDL Cholesterol: 88 mg/dL (ref 0–99)
NonHDL: 120.99
Total CHOL/HDL Ratio: 4
Triglycerides: 165 mg/dL — ABNORMAL HIGH (ref 0.0–149.0)
VLDL: 33 mg/dL (ref 0.0–40.0)

## 2022-10-29 LAB — HEMOGLOBIN A1C: Hgb A1c MFr Bld: 6.2 % (ref 4.6–6.5)

## 2022-11-05 ENCOUNTER — Encounter: Payer: Self-pay | Admitting: Family Medicine

## 2022-11-05 ENCOUNTER — Ambulatory Visit (INDEPENDENT_AMBULATORY_CARE_PROVIDER_SITE_OTHER): Payer: Medicare HMO | Admitting: Family Medicine

## 2022-11-05 VITALS — BP 116/72 | HR 64 | Temp 97.9°F | Ht 62.5 in | Wt 199.5 lb

## 2022-11-05 DIAGNOSIS — I10 Essential (primary) hypertension: Secondary | ICD-10-CM | POA: Diagnosis not present

## 2022-11-05 DIAGNOSIS — E669 Obesity, unspecified: Secondary | ICD-10-CM

## 2022-11-05 DIAGNOSIS — E2839 Other primary ovarian failure: Secondary | ICD-10-CM

## 2022-11-05 DIAGNOSIS — R7303 Prediabetes: Secondary | ICD-10-CM | POA: Diagnosis not present

## 2022-11-05 DIAGNOSIS — Z0000 Encounter for general adult medical examination without abnormal findings: Secondary | ICD-10-CM | POA: Diagnosis not present

## 2022-11-05 DIAGNOSIS — E785 Hyperlipidemia, unspecified: Secondary | ICD-10-CM | POA: Diagnosis not present

## 2022-11-05 DIAGNOSIS — M8589 Other specified disorders of bone density and structure, multiple sites: Secondary | ICD-10-CM | POA: Diagnosis not present

## 2022-11-05 DIAGNOSIS — Z1211 Encounter for screening for malignant neoplasm of colon: Secondary | ICD-10-CM | POA: Diagnosis not present

## 2022-11-05 MED ORDER — ROSUVASTATIN CALCIUM 20 MG PO TABS: 20.0000 mg | ORAL_TABLET | ORAL | 3 refills | Status: DC

## 2022-11-05 MED ORDER — MONTELUKAST SODIUM 10 MG PO TABS
10.0000 mg | ORAL_TABLET | Freq: Every day | ORAL | 3 refills | Status: DC
Start: 2022-11-05 — End: 2023-11-18

## 2022-11-05 MED ORDER — SIMVASTATIN 40 MG PO TABS: 40.0000 mg | ORAL_TABLET | ORAL | 3 refills | Status: DC

## 2022-11-05 MED ORDER — AMLODIPINE BESYLATE 5 MG PO TABS: 5.0000 mg | ORAL_TABLET | Freq: Every day | ORAL | 3 refills | Status: DC

## 2022-11-05 NOTE — Patient Instructions (Addendum)
If you want to update your tetanus shot- go to pharmacy or health dept  Get your 2nd shingrix as planned    Discuss cholesterol plan with Dr Nydia Bouton when you go    Try to get most of your carbohydrates from produce (with the exception of white potatoes)  Eat less bread/pasta/rice/snack foods/cereals/sweets and other items from the middle of the grocery store (processed carbs) The mediterranean diet is great    Add some strength training to your routine, this is important for bone and brain health and can reduce your risk of falls and help your body use insulin properly and regulate weight  Light weights, exercise bands , and internet videos are a good way to start  Yoga (chair or regular), machines , floor exercises or a gym with machines are also good options   Your mammogram is due in June  Let me know when want to schedule it and I will order

## 2022-11-05 NOTE — Progress Notes (Signed)
Subjective:    Patient ID: Cheryl Joseph, female    DOB: 04/20/1941, 82 y.o.   MRN: [id number]  HPI Here for health maintenance exam and to review chronic medical problems    Wt Readings from Last 3 Encounters:  11/05/22 199 lb 8 oz (90.5 kg)  10/25/22 201 lb (91.2 kg)  09/28/22 210 lb 12.8 oz (95.6 kg)   35.91 kg/m  Doing well  Reading and baking and cooking - stays busy  Involved in church   Feeling good  Taking care of herself   Some word recognizing problems since her stroke Addressed with Dr Sherryll Burger    Immunization History  Administered Date(s) Administered   Fluad Quad(high Dose 65+) 04/26/2019, 05/01/2020, 04/23/2022   Influenza Split 05/27/2011, 05/01/2012   Influenza Whole 04/27/2007, 05/23/2008, 05/14/2009, 05/05/2010   Influenza,inj,Quad PF,6+ Mos 05/21/2013, 04/26/2014, 05/09/2015, 05/19/2016, 04/26/2017, 05/18/2018   Influenza,inj,quad, With Preservative 04/26/2016   PFIZER(Purple Top)SARS-COV-2 Vaccination 09/24/2019, 10/15/2019, 05/14/2020   Pneumococcal Conjugate-13 08/19/2014   Pneumococcal Polysaccharide-23 05/31/2005   Pneumococcal-Unspecified 04/28/2015   Td 11/24/1998, 05/14/2009   Zoster, Live 03/05/2008   Health Maintenance Due  Topic Date Due   Diabetic kidney evaluation - Urine ACR  Never done   Zoster Vaccines- Shingrix (1 of 2) Never done   FOOT EXAM  08/05/2018   DTaP/Tdap/Td (3 - Tdap) 05/15/2019   OPHTHALMOLOGY EXAM  12/01/2021   COVID-19 Vaccine (4 - 2023-24 season) 03/26/2022   Shingrix - got the first one April 1 and 2nd is planned in June   Tetanus :  needs   Eye exam : - goes every year in may   Derm care - every year in April   Mammogram 12/2021 Self breast exam-no lumps  Dexa 12/2020 at [place] Worse osteopenia at the time  Declines another  Declines treatment after discussing alendronate  Falls: none Fractures;none  Did have some trouble with balance but then started doing exercises for that from neuro  Also  exercising for a hip pain and also knee pain  Supplements -vitamin D3  Exercise = walking/ bike and strength exercises   Colonoscopy 2016  Declines further screening   HTN bp is stable today  No cp or palpitations or headaches or edema  No side effects to medicines  BP Readings from Last 3 Encounters:  11/05/22 116/72  09/28/22 124/82  02/25/22 110/80    Pulse Readings from Last 3 Encounters:  11/05/22 64  09/28/22 81  02/25/22 64   Amlodipine 5 mg daily   Hyperlipidemia Lab Results  Component Value Date   CHOL 168 10/29/2022   CHOL 160 04/23/2022   CHOL 216 (H) 02/25/2022   Lab Results  Component Value Date   HDL 47.50 10/29/2022   HDL 42.60 04/23/2022   HDL 43.00 02/25/2022   Lab Results  Component Value Date   LDLCALC 88 10/29/2022   LDLCALC 88 04/23/2022   LDLCALC 88 01/01/2022   Lab Results  Component Value Date   TRIG 165.0 (H) 10/29/2022   TRIG 147.0 04/23/2022   TRIG 271.0 (H) 02/25/2022   Lab Results  Component Value Date   CHOLHDL 4 10/29/2022   CHOLHDL 4 04/23/2022   CHOLHDL 5 02/25/2022   Lab Results  Component Value Date   LDLDIRECT 135.0 02/25/2022   LDLDIRECT 97.0 07/06/2019   LDLDIRECT 124.0 07/31/2018   Statins caused headache in the past  Simvastatin, rosuvastatin  Simvastatin 40 alt with rosuvastatin 2 d per week (per neuro Dr Sherryll Burger) - she tried  this   Rosuvastatin 3 d per week- tolerates  Simvastatin other days - tolerates that   Mediterranean diet   Sees cardiology- she sees Dr Nydia Bouton in June   On asa 81 for past cva    Last metabolic panel Lab Results  Component Value Date   GLUCOSE 121 (H) 10/29/2022   NA 143 10/29/2022   K 4.7 10/29/2022   CL 106 10/29/2022   CO2 30 10/29/2022   BUN 17 10/29/2022   CREATININE 0.84 10/29/2022   GFRNONAA >60 01/01/2022   CALCIUM 9.1 10/29/2022   PHOS 3.4 06/01/2010   PROT 6.2 10/29/2022   ALBUMIN 3.8 10/29/2022   BILITOT 0.6 10/29/2022   ALKPHOS 90 10/29/2022   AST 18  10/29/2022   ALT 11 10/29/2022   ANIONGAP 4 (L) 01/01/2022   GFR 64.5 Lab Results  Component Value Date   WBC 5.4 10/29/2022   HGB 15.3 (H) 10/29/2022   HCT 45.3 10/29/2022   MCV 95.5 10/29/2022   PLT 238.0 10/29/2022   Lab Results  Component Value Date   TSH 2.69 10/29/2022   Lab Results  Component Value Date   HGBA1C 6.2 10/29/2022   Down from 6.4  Better diet  Losing weight    Patient Active Problem List   Diagnosis Date Noted   Contact dermatitis 09/28/2022   Chronic headache 02/25/2022   Snoring 02/25/2022   Microscopic hematuria 01/29/2022   History of stroke 12/31/2021   Umbilical hernia 01/05/2021   Pre-operative general physical examination 07/06/2019   Estrogen deficiency 05/25/2016   Routine general medical examination at a health care facility 11/23/2015   Hammertoe 10/28/2015   Diverticulosis of colon without hemorrhage 02/14/2015   Colon cancer screening 08/19/2014   Essential hypertension 02/25/2014   Encounter for Medicare annual wellness exam 07/16/2013   Prediabetes 05/14/2009   Rheumatoid arthritis with positive rheumatoid factor 05/13/2008   Osteopenia 03/07/2007   Hyperlipidemia 01/03/2007   Obesity (BMI 30-39.9) 01/03/2007   ALLERGIC RHINITIS, SEASONAL 01/03/2007   ASTHMA 01/03/2007   OVERACTIVE BLADDER 01/03/2007   Past Medical History:  Diagnosis Date   Anginal pain    Arthritis    RA hands(seronegative)   Asthma    Back pain    Cancer    skin   Cataract    HPV in female 1996   HPV with colposcopy (all neg paps since)   Hyperlipidemia    Hypertension    Incarcerated ventral hernia    Obesity    Osteopenia    Pre-diabetes    Shortness of breath dyspnea    Shoulder pain    Past Surgical History:  Procedure Laterality Date   CATARACT EXTRACTION, BILATERAL Bilateral    COLONOSCOPY  06/01/2004   COLONOSCOPY WITH PROPOFOL N/A 05/01/2015   Procedure: COLONOSCOPY WITH PROPOFOL;  Surgeon: Scot Jun, MD;  Location: [facility]  ENDOSCOPY;  Service: Endoscopy;  Laterality: N/A;   CORRECTION HAMMER TOE Right    2nd toe   JOINT REPLACEMENT Right    right knee replacement January 2021   SQUAMOUS CELL CARCINOMA EXCISION Left 12/2017   left arm   TONSILLECTOMY     VAGINAL HYSTERECTOMY  2015   with pelvic organ prolapse surgery    XI ROBOTIC ASSISTED VENTRAL HERNIA N/A 06/10/2021   Procedure: XI ROBOTIC ASSISTED VENTRAL HERNIA;  Surgeon: Carolan Shiver, MD;  Location: [facility] ORS;  Service: General;  Laterality: N/A;   Social History   Tobacco Use   Smoking status: Never   Smokeless tobacco:  Never  Vaping Use   Vaping Use: Never used  Substance Use Topics   Alcohol use: No    Alcohol/week: 0.0 standard drinks of alcohol   Drug use: No   Family History  Problem Relation Age of Onset   Diabetes Mother    Hyperlipidemia Mother    Heart disease Mother    Hypertension Mother    Obesity Mother    Kyphosis Mother    Heart disease Father    Hypertension Father    Cancer Father        lung Cancer smoker   Diabetes Daughter    Kyphosis Sister    Breast cancer Neg Hx    Allergies  Allergen Reactions   Atorvastatin     Headache    Tolterodine Tartrate Other (See Comments)    Unknown (back ache)   Vesicare [Solifenacin Succinate]     Eye problems    Zetia [Ezetimibe]     Headache    Current Outpatient Medications on File Prior to Visit  Medication Sig Dispense Refill   acetaminophen (TYLENOL) 500 MG tablet Take 1,000 mg by mouth every 6 (six) hours as needed for mild pain.     albuterol (VENTOLIN HFA) 108 (90 Base) MCG/ACT inhaler INHALE TWO PUFFS BY MOUTH EVERY 4 HOURS AS NEEDED FOR WHEEZING AND 2 PUFFS BEFORE EXERCISE OR EXPOSURE TO COLD AIR 18 each 2   aspirin EC 81 MG tablet Take 1 tablet (81 mg total) by mouth daily. Swallow whole.     b complex vitamins tablet Take 1 tablet by mouth every Monday, Wednesday, and Friday.     Cholecalciferol (VITAMIN D3 PO) Take 2 tablets by mouth daily.      Hyaluronic Acid-Vitamin C (HYALURONIC ACID PO) Take 1 tablet by mouth every Monday, Wednesday, and Friday.     Multiple Vitamin (MULTIVITAMIN) capsule Take 1 capsule by mouth daily. Reported on 12/18/2015     Omega-3 Fatty Acids (FISH OIL PO) Take 1 capsule by mouth daily. Reported on 12/18/2015     OVER THE COUNTER MEDICATION Take 2 capsules by mouth daily. BONE UP     oxybutynin (OXYTROL) 3.9 MG/24HR Place 1 patch onto the skin once a week. Sunday.  Home med.     Probiotic Product (PROBIOTIC DAILY PO) Take 1 tablet by mouth daily.     Pumpkin Seed-Soy Germ (AZO BLADDER CONTROL/GO-LESS PO) Take 1 capsule by mouth at bedtime.     TURMERIC PO Take 720 mg by mouth every Monday, Wednesday, and Friday.     Ubiquinol 100 MG CAPS Take 1 capsule by mouth every Monday, Wednesday, and Friday.     No current facility-administered medications on file prior to visit.    Review of Systems  Constitutional:  Positive for fatigue. Negative for activity change, appetite change, fever and unexpected weight change.  HENT:  Negative for congestion, ear pain, rhinorrhea, sinus pressure and sore throat.   Eyes:  Negative for pain, redness and visual disturbance.  Respiratory:  Negative for cough, shortness of breath and wheezing.   Cardiovascular:  Negative for chest pain and palpitations.  Gastrointestinal:  Negative for abdominal pain, blood in stool, constipation and diarrhea.  Endocrine: Negative for polydipsia and polyuria.  Genitourinary:  Negative for dysuria, frequency and urgency.  Musculoskeletal:  Positive for arthralgias. Negative for back pain and myalgias.  Skin:  Negative for pallor and rash.  Allergic/Immunologic: Negative for environmental allergies.  Neurological:  Negative for dizziness, syncope and headaches.  Hematological:  Negative  for adenopathy. Does not bruise/bleed easily.  Psychiatric/Behavioral:  Negative for decreased concentration and dysphoric mood. The patient is not  nervous/anxious.        Objective:   Physical Exam Constitutional:      General: She is not in acute distress.    Appearance: Normal appearance. She is well-developed. She is obese. She is not ill-appearing or diaphoretic.  HENT:     Head: Normocephalic and atraumatic.     Right Ear: Tympanic membrane, ear canal and external ear normal.     Left Ear: Tympanic membrane, ear canal and external ear normal.     Nose: Nose normal. No congestion.     Mouth/Throat:     Mouth: Mucous membranes are moist.     Pharynx: Oropharynx is clear. No posterior oropharyngeal erythema.  Eyes:     General: No scleral icterus.    Extraocular Movements: Extraocular movements intact.     Conjunctiva/sclera: Conjunctivae normal.     Pupils: Pupils are equal, round, and reactive to light.  Neck:     Thyroid: No thyromegaly.     Vascular: No carotid bruit or JVD.  Cardiovascular:     Rate and Rhythm: Normal rate and regular rhythm.     Pulses: Normal pulses.     Heart sounds: Normal heart sounds.     No gallop.  Pulmonary:     Effort: Pulmonary effort is normal. No respiratory distress.     Breath sounds: Normal breath sounds. No wheezing.     Comments: Good air exch Chest:     Chest wall: No tenderness.  Abdominal:     General: Bowel sounds are normal. There is no distension or abdominal bruit.     Palpations: Abdomen is soft. There is no mass.     Tenderness: There is no abdominal tenderness.     Hernia: No hernia is present.  Genitourinary:    Comments: Breast exam: No mass, nodules, thickening, tenderness, bulging, retraction, inflamation, nipple discharge or skin changes noted.  No axillary or clavicular LA.     Musculoskeletal:        General: No tenderness. Normal range of motion.     Cervical back: Normal range of motion and neck supple. No rigidity. No muscular tenderness.     Right lower leg: No edema.     Left lower leg: No edema.     Comments: No kyphosis   Lymphadenopathy:      Cervical: No cervical adenopathy.  Skin:    General: Skin is warm and dry.     Coloration: Skin is not pale.     Findings: No erythema or rash.     Comments: Solar lentigines diffusely scatteredsks  Neurological:     Mental Status: She is alert. Mental status is at baseline.     Cranial Nerves: No cranial nerve deficit.     Motor: No abnormal muscle tone.     Coordination: Coordination normal.     Gait: Gait normal.     Deep Tendon Reflexes: Reflexes are normal and symmetric.  Psychiatric:        Mood and Affect: Mood normal.        Cognition and Memory: Cognition and memory normal.           Assessment & Plan:   Problem List Items Addressed This Visit       Cardiovascular and Mediastinum   Essential hypertension    bp in fair control at this time  BP Readings from Last  1 Encounters:  11/05/22 116/72  No changes needed Most recent labs reviewed  Disc lifstyle change with low sodium diet and exercise  Controlled by amlodipine 5 mg daily in setting of past CVA      Relevant Medications   amLODipine (NORVASC) 5 MG tablet   rosuvastatin (CRESTOR) 20 MG tablet   simvastatin (ZOCOR) 40 MG tablet     Musculoskeletal and Integument   Osteopenia    Dexa was 12/2020  She declines another study at this time  No falls or fractures  Takes vit D Disc opt for strength building exercise         Other   Colon cancer screening    Colonoscopy was 2016 Declines further screening       Hyperlipidemia    Disc goals for lipids and reasons to control them Rev last labs with pt Rev low sat fat diet in detail  She is able to tolerate simvastatin 40 every other day alt with rosuvastatin 20  Plans to discuss further with cardiology LDL is 88  H/o past cva so goal is 70 or below  Eating well        Relevant Medications   amLODipine (NORVASC) 5 MG tablet   rosuvastatin (CRESTOR) 20 MG tablet   simvastatin (ZOCOR) 40 MG tablet   Obesity (BMI 30-39.9)    Discussed how  this problem influences overall health and the risks it imposes  Reviewed plan for weight loss with lower calorie diet (via better food choices and also portion control or program like weight watchers) and exercise building up to or more than 30 minutes 5 days per week including some aerobic activity        Prediabetes    Lab Results  Component Value Date   HGBA1C 6.2 10/29/2022  This is down from 6.2 disc imp of low glycemic diet and wt loss to prevent DM2         Routine general medical examination at a health care facility - Primary    Reviewed health habits including diet and exercise and skin cancer prevention Reviewed appropriate screening tests for age  Also reviewed health mt list, fam hx and immunization status , as well as social and family history   See HPI Labs reviewed and ordered Planning 2nd shingrix vaccine in June Will get tetanus shot outside of office  Eye exam planned for may  Mammogram utd 12/2021  Dexa 12/2020 - declines another one at this time  No falls or fx, takes vit D, discussed strength training Colonoscopy 2016 , declines further screening

## 2022-11-07 NOTE — Assessment & Plan Note (Signed)
bp in fair control at this time  BP Readings from Last 1 Encounters:  11/05/22 116/72   No changes needed Most recent labs reviewed  Disc lifstyle change with low sodium diet and exercise  Controlled by amlodipine 5 mg daily in setting of past CVA

## 2022-11-07 NOTE — Assessment & Plan Note (Signed)
Lab Results  Component Value Date   HGBA1C 6.2 10/29/2022   This is down from 6.2 disc imp of low glycemic diet and wt loss to prevent DM2

## 2022-11-07 NOTE — Assessment & Plan Note (Signed)
Dexa was 12/2020  She declines another study at this time  No falls or fractures  Takes vit D Disc opt for strength building exercise

## 2022-11-07 NOTE — Assessment & Plan Note (Signed)
Discussed how this problem influences overall health and the risks it imposes  Reviewed plan for weight loss with lower calorie diet (via better food choices and also portion control or program like weight watchers) and exercise building up to or more than 30 minutes 5 days per week including some aerobic activity    

## 2022-11-07 NOTE — Assessment & Plan Note (Signed)
Colonoscopy was 2016 Declines further screening

## 2022-11-07 NOTE — Assessment & Plan Note (Signed)
Disc goals for lipids and reasons to control them Rev last labs with pt Rev low sat fat diet in detail  She is able to tolerate simvastatin 40 every other day alt with rosuvastatin 20  Plans to discuss further with cardiology LDL is 88  H/o past cva so goal is 70 or below  Eating well

## 2022-11-07 NOTE — Assessment & Plan Note (Signed)
Reviewed health habits including diet and exercise and skin cancer prevention Reviewed appropriate screening tests for age  Also reviewed health mt list, fam hx and immunization status , as well as social and family history   See HPI Labs reviewed and ordered Planning 2nd shingrix vaccine in June Will get tetanus shot outside of office  Eye exam planned for may  Mammogram utd 12/2021  Dexa 12/2020 - declines another one at this time  No falls or fx, takes vit D, discussed strength training Colonoscopy 2016 , declines further screening

## 2022-11-22 ENCOUNTER — Emergency Department: Payer: Medicare HMO

## 2022-11-22 ENCOUNTER — Emergency Department
Admission: EM | Admit: 2022-11-22 | Discharge: 2022-11-22 | Disposition: A | Discharge status: Home / Self Care | Payer: Medicare HMO | Attending: Emergency Medicine | Admitting: Emergency Medicine

## 2022-11-22 ENCOUNTER — Other Ambulatory Visit: Payer: Self-pay

## 2022-11-22 DIAGNOSIS — R42 Dizziness and giddiness: Secondary | ICD-10-CM | POA: Diagnosis not present

## 2022-11-22 DIAGNOSIS — R410 Disorientation, unspecified: Secondary | ICD-10-CM | POA: Insufficient documentation

## 2022-11-22 DIAGNOSIS — R9431 Abnormal electrocardiogram [ECG] [EKG]: Secondary | ICD-10-CM | POA: Insufficient documentation

## 2022-11-22 DIAGNOSIS — R519 Headache, unspecified: Secondary | ICD-10-CM | POA: Diagnosis not present

## 2022-11-22 DIAGNOSIS — I6789 Other cerebrovascular disease: Secondary | ICD-10-CM | POA: Diagnosis not present

## 2022-11-22 DIAGNOSIS — R4701 Aphasia: Secondary | ICD-10-CM | POA: Insufficient documentation

## 2022-11-22 DIAGNOSIS — R4789 Other speech disturbances: Secondary | ICD-10-CM

## 2022-11-22 DIAGNOSIS — E860 Dehydration: Secondary | ICD-10-CM | POA: Insufficient documentation

## 2022-11-22 DIAGNOSIS — I10 Essential (primary) hypertension: Secondary | ICD-10-CM | POA: Insufficient documentation

## 2022-11-22 DIAGNOSIS — R531 Weakness: Secondary | ICD-10-CM | POA: Diagnosis not present

## 2022-11-22 LAB — COMPREHENSIVE METABOLIC PANEL
ALT: 15 U/L (ref 0–44)
AST: 23 U/L (ref 15–41)
Albumin: 3.6 g/dL (ref 3.5–5.0)
Alkaline Phosphatase: 80 U/L (ref 38–126)
Anion gap: 8 (ref 5–15)
BUN: 20 mg/dL (ref 8–23)
CO2: 26 mmol/L (ref 22–32)
Calcium: 9 mg/dL (ref 8.9–10.3)
Chloride: 108 mmol/L (ref 98–111)
Creatinine, Ser: 0.76 mg/dL (ref 0.44–1.00)
GFR, Estimated: >60 mL/min (ref >60)
Glucose, Bld: 157 mg/dL — ABNORMAL HIGH (ref 70–99)
Potassium: 3.8 mmol/L (ref 3.5–5.1)
Sodium: 142 mmol/L (ref 135–145)
Total Bilirubin: 0.7 mg/dL (ref 0.3–1.2)
Total Protein: 7 g/dL (ref 6.5–8.1)

## 2022-11-22 LAB — URINALYSIS, W/ REFLEX TO CULTURE (INFECTION SUSPECTED)
Bilirubin Urine: NEGATIVE
Glucose, UA: NEGATIVE mg/dL
Hgb urine dipstick: NEGATIVE
Ketones, ur: NEGATIVE mg/dL
Leukocytes,Ua: NEGATIVE
Nitrite: NEGATIVE
Protein, ur: NEGATIVE mg/dL
Specific Gravity, Urine: 1.016 (ref 1.005–1.030)
pH: 5 (ref 5.0–8.0)

## 2022-11-22 LAB — ETHANOL: Alcohol, Ethyl (B): <10 mg/dL (ref <10)

## 2022-11-22 LAB — DIFFERENTIAL
Abs Immature Granulocytes: 0.03 10*3/uL (ref 0.00–0.07)
Basophils Absolute: 0.1 10*3/uL (ref 0.0–0.1)
Basophils Relative: 1 %
Eosinophils Absolute: 0.2 10*3/uL (ref 0.0–0.5)
Eosinophils Relative: 2 %
Immature Granulocytes: 0 %
Lymphocytes Relative: 39 %
Lymphs Abs: 2.9 10*3/uL (ref 0.7–4.0)
Monocytes Absolute: 0.6 10*3/uL (ref 0.1–1.0)
Monocytes Relative: 8 %
Neutro Abs: 3.7 10*3/uL (ref 1.7–7.7)
Neutrophils Relative %: 50 %

## 2022-11-22 LAB — CBC
HCT: 47.7 % — ABNORMAL HIGH (ref 36.0–46.0)
Hemoglobin: 15.5 g/dL — ABNORMAL HIGH (ref 12.0–15.0)
MCH: 31.1 pg (ref 26.0–34.0)
MCHC: 32.5 g/dL (ref 30.0–36.0)
MCV: 95.8 fL (ref 80.0–100.0)
Platelets: 227 10*3/uL (ref 150–400)
RBC: 4.98 MIL/uL (ref 3.87–5.11)
RDW: 13 % (ref 11.5–15.5)
WBC: 7.5 10*3/uL (ref 4.0–10.5)
nRBC: 0 % (ref 0.0–0.2)

## 2022-11-22 LAB — PROTIME-INR
INR: 1 (ref 0.8–1.2)
Prothrombin Time: 13.5 seconds (ref 11.4–15.2)

## 2022-11-22 LAB — APTT: aPTT: 27 seconds (ref 24–36)

## 2022-11-22 MED ORDER — SODIUM CHLORIDE 0.9% FLUSH
3.0000 mL | Freq: Once | INTRAVENOUS | Status: AC
  Administered 2022-11-22: 3 mL via INTRAVENOUS

## 2022-11-22 MED ORDER — LORAZEPAM 1 MG PO TABS
0.5000 mg | ORAL_TABLET | Freq: Once | ORAL | Status: AC
  Administered 2022-11-22: 0.5 mg via ORAL
  Filled 2022-11-22: qty 1

## 2022-11-22 MED ORDER — GADOBUTROL 1 MMOL/ML IV SOLN
8.0000 mL | Freq: Once | INTRAVENOUS | Status: AC | PRN
  Administered 2022-11-22: 8 mL via INTRAVENOUS

## 2022-11-22 NOTE — ED Triage Notes (Addendum)
Pt comes with c/o vertigo, weakness and headache. Pt states some confusion. Pt states she was reading and she couldn't say the word.   Mini neuro neg  Pt state she just feels like she won't to sleep. Pt states she was up doing stuff and then it started at like 10am.  Pt speech is clear, no droop noted and no numbness. Pt also denies any difference in sensation.

## 2022-11-22 NOTE — ED Notes (Signed)
Pt to MRI

## 2022-11-22 NOTE — ED Provider Notes (Signed)
[facility] Provider Note   Event Date/Time   First MD Initiated Contact with Patient 11/22/22 1651     (approximate) History  Headache  HPI Cheryl Joseph is a 82 y.o. female with a stated past medical history of hypertension and CVA with residual word finding deficits who presents complaining of confusion, headache, and dehydration that began at approximately 10 AM this morning.  Patient states that she was reading and could not find the right word to say.  Patient also reports calling her daughter who was concerned that she was not speaking like herself, was very confused, and became extremely upset that she could not find the right words.  Patient states that the symptoms improved after eating and with time.  Patient endorses only mild headache at this time. ROS: Patient currently denies any vision changes, tinnitus, difficulty speaking, facial droop, sore throat, chest pain, shortness of breath, abdominal pain, nausea/vomiting/diarrhea, dysuria, or weakness/numbness/paresthesias in any extremity   Physical Exam  Triage Vital Signs: ED Triage Vitals  Enc Vitals Group     BP 11/22/22 1130 (!) 168/72     Pulse Rate 11/22/22 1130 72     Resp 11/22/22 1130 16     Temp 11/22/22 1130 97.9 F (36.6 C)     Temp src --      SpO2 11/22/22 1130 94 %     Weight --      Height --      Head Circumference --      Peak Flow --      Pain Score 11/22/22 1133 8     Pain Loc --      Pain Edu? --      Excl. in GC? --    Most recent vital signs: Vitals:   11/22/22 1834 11/22/22 2102  BP: (!) 150/76 (!) 144/78  Pulse: 70 71  Resp: 16 18  Temp: 97.7 F (36.5 C) 97.8 F (36.6 C)  SpO2: 96% 99%   General: Awake, oriented x4. CV:  Good peripheral perfusion.  Resp:  Normal effort.  Abd:  No distention.  Other:  82 year old overweight Caucasian female laying in bed in no acute distress.  NIHSS 0 ED Results / Procedures / Treatments  Labs (all labs ordered are  listed, but only abnormal results are displayed) Labs Reviewed  CBC - Abnormal; Notable for the following components:      Result Value   Hemoglobin 15.5 (*)    HCT 47.7 (*)    All other components within normal limits  COMPREHENSIVE METABOLIC PANEL - Abnormal; Notable for the following components:   Glucose, Bld 157 (*)    All other components within normal limits  URINALYSIS, W/ REFLEX TO CULTURE (INFECTION SUSPECTED) - Abnormal; Notable for the following components:   Color, Urine YELLOW (*)    APPearance HAZY (*)    Bacteria, UA RARE (*)    All other components within normal limits  PROTIME-INR  APTT  DIFFERENTIAL  ETHANOL  CBG MONITORING, ED   EKG ED ECG REPORT I, Merwyn Katos, the attending physician, personally viewed and interpreted this ECG. Date: 11/22/2022 EKG Time: 1146 Rate: 62 Rhythm: normal sinus rhythm QRS Axis: normal Intervals: normal ST/T Wave abnormalities: normal Narrative Interpretation: no evidence of acute ischemia RADIOLOGY ED MD interpretation: CT of the head without contrast interpreted by me shows no evidence of acute abnormalities including no intracerebral hemorrhage, obvious masses, or significant edema -Agree with radiology assessment Official radiology report(s): MR  Brain W and Wo Contrast  Result Date: 11/22/2022 CLINICAL DATA:  Neuro deficit, acute, stroke suspected. Vertigo, weakness and headache. EXAM: MRI HEAD WITHOUT AND WITH CONTRAST TECHNIQUE: Multiplanar, multiecho pulse sequences of the brain and surrounding structures were obtained without and with intravenous contrast. CONTRAST:  8mL GADAVIST GADOBUTROL 1 MMOL/ML IV SOLN COMPARISON:  Head CT 11/22/2022.  MRI brain 01/20/2022. FINDINGS: Brain: No acute infarct or hemorrhage. Unchanged moderate chronic small-vessel disease. No hydrocephalus or extra-axial collection. No mass or midline shift. No foci of abnormal susceptibility. Vascular: Normal flow voids. Skull and upper cervical  spine: Normal marrow signal. Sinuses/Orbits: Unremarkable. Other: None. IMPRESSION: 1. No acute intracranial process. 2. Unchanged moderate chronic small-vessel disease. Electronically Signed   By: Orvan Falconer M.D.   On: 11/22/2022 20:30   PROCEDURES: Critical Care performed: No .1-3 Lead EKG Interpretation  Performed by: Merwyn Katos, MD Authorized by: Merwyn Katos, MD     Interpretation: normal     ECG rate:  71   ECG rate assessment: normal     Rhythm: sinus rhythm     Ectopy: none     Conduction: normal    MEDICATIONS ORDERED IN ED: Medications  sodium chloride flush (NS) 0.9 % injection 3 mL (3 mLs Intravenous Given 11/22/22 1834)  LORazepam (ATIVAN) tablet 0.5 mg (0.5 mg Oral Given 11/22/22 1833)  gadobutrol (GADAVIST) 1 MMOL/ML injection 8 mL (8 mLs Intravenous Contrast Given 11/22/22 2018)   IMPRESSION / MDM / ASSESSMENT AND PLAN / ED COURSE  I reviewed the triage vital signs and the nursing notes.                             Patient's presentation is most consistent with acute presentation with potential threat to life or bodily function.  The patient suffered an episode of altered mental status, but there is no overt concern for a dangerous emergent cause such as, but not limited to, CNS infection, severe Toxidrome, severe metabolic derangement, or stroke.  Given History, Physical, and Workup the cause appears to be dehydration/hypotension  Disposition: Discharge. At the time of discharge, the patient is back to baseline mental status.   FINAL CLINICAL IMPRESSION(S) / ED DIAGNOSES   Final diagnoses:  Dehydration  Nonintractable headache, unspecified chronicity pattern, unspecified headache type  Episodic lightheadedness  Word finding difficulty   Rx / DC Orders   ED Discharge Orders     None      Note:  This document was prepared using Dragon voice recognition software and may include unintentional dictation errors.   Merwyn Katos, MD 11/23/22  [phone]

## 2022-11-25 ENCOUNTER — Ambulatory Visit: Payer: Self-pay

## 2022-11-25 ENCOUNTER — Telehealth: Payer: Self-pay | Admitting: *Deleted

## 2022-11-25 NOTE — Chronic Care Management (AMB) (Signed)
   11/25/2022  Cheryl Joseph 03-08-1941 [id number]   Reason for Encounter: Patient is not currently enrolled in the CCM program. CCM status changed to previously enrolled.   Katha Cabal RN Care Manager/Chronic Care Management [phone]

## 2022-11-25 NOTE — Transitions of Care (Post Inpatient/ED Visit) (Signed)
   11/25/2022  Name: Cheryl Joseph MRN: [id number] DOB: 15-Feb-1941  Today's TOC FU Call Status: Today's TOC FU Call Status:: Unsuccessul Call (1st Attempt) Unsuccessful Call (1st Attempt) Date: 11/25/22  Attempted to reach the patient regarding the most recent Inpatient/ED visit.  Follow Up Plan: Additional outreach attempts will be made to reach the patient to complete the Transitions of Care (Post Inpatient/ED visit) call.   Gean Maidens BSN RN [facility] [phone]

## 2022-11-30 ENCOUNTER — Telehealth: Payer: Self-pay | Admitting: *Deleted

## 2022-11-30 NOTE — Transitions of Care (Post Inpatient/ED Visit) (Signed)
   11/30/2022  Name: Cheryl Joseph MRN: [id number] DOB: [birth year]  Today's TOC FU Call Status: Today's TOC FU Call Status:: Unsuccessful Call (2nd Attempt) Unsuccessful Call (2nd Attempt) Date: 11/30/22  Attempted to reach the patient regarding the most recent Inpatient/ED visit.  Follow Up Plan: Additional outreach attempts will be made to reach the patient to complete the Transitions of Care (Post Inpatient/ED visit) call.   Gean Maidens BSN RN [facility] [phone]

## 2022-12-03 ENCOUNTER — Ambulatory Visit (INDEPENDENT_AMBULATORY_CARE_PROVIDER_SITE_OTHER): Payer: Medicare HMO | Admitting: Family Medicine

## 2022-12-03 ENCOUNTER — Telehealth: Payer: Self-pay

## 2022-12-03 ENCOUNTER — Encounter: Payer: Self-pay | Admitting: Family Medicine

## 2022-12-03 VITALS — BP 132/80 | HR 64 | Temp 97.6°F | Ht 62.5 in | Wt 200.2 lb

## 2022-12-03 DIAGNOSIS — E860 Dehydration: Secondary | ICD-10-CM | POA: Insufficient documentation

## 2022-12-03 DIAGNOSIS — I10 Essential (primary) hypertension: Secondary | ICD-10-CM | POA: Diagnosis not present

## 2022-12-03 DIAGNOSIS — R519 Headache, unspecified: Secondary | ICD-10-CM | POA: Diagnosis not present

## 2022-12-03 DIAGNOSIS — R1084 Generalized abdominal pain: Secondary | ICD-10-CM | POA: Insufficient documentation

## 2022-12-03 DIAGNOSIS — R3129 Other microscopic hematuria: Secondary | ICD-10-CM | POA: Diagnosis not present

## 2022-12-03 DIAGNOSIS — G8929 Other chronic pain: Secondary | ICD-10-CM

## 2022-12-03 LAB — POC URINALSYSI DIPSTICK (AUTOMATED)
Bilirubin, UA: NEGATIVE
Blood, UA: NEGATIVE
Glucose, UA: NEGATIVE
Ketones, UA: NEGATIVE
Leukocytes, UA: NEGATIVE
Nitrite, UA: NEGATIVE
Protein, UA: NEGATIVE
Spec Grav, UA: 1.02 (ref 1.010–1.025)
Urobilinogen, UA: 0.2 E.U./dL
pH, UA: 5.5 (ref 5.0–8.0)

## 2022-12-03 LAB — CBC WITH DIFFERENTIAL/PLATELET
Basophils Absolute: 0.1 10*3/uL (ref 0.0–0.1)
Basophils Relative: 1 % (ref 0.0–3.0)
Eosinophils Absolute: 0.1 10*3/uL (ref 0.0–0.7)
Eosinophils Relative: 1.6 % (ref 0.0–5.0)
HCT: 45.4 % (ref 36.0–46.0)
Hemoglobin: 15.1 g/dL — ABNORMAL HIGH (ref 12.0–15.0)
Lymphocytes Relative: 28.9 % (ref 12.0–46.0)
Lymphs Abs: 2.1 10*3/uL (ref 0.7–4.0)
MCHC: 33.3 g/dL (ref 30.0–36.0)
MCV: 94.5 fl (ref 78.0–100.0)
Monocytes Absolute: 0.7 10*3/uL (ref 0.1–1.0)
Monocytes Relative: 9.6 % (ref 3.0–12.0)
Neutro Abs: 4.3 10*3/uL (ref 1.4–7.7)
Neutrophils Relative %: 58.9 % (ref 43.0–77.0)
Platelets: 230 10*3/uL (ref 150.0–400.0)
RBC: 4.8 Mil/uL (ref 3.87–5.11)
RDW: 13.4 % (ref 11.5–15.5)
WBC: 7.3 10*3/uL (ref 4.0–10.5)

## 2022-12-03 LAB — BASIC METABOLIC PANEL
BUN: 21 mg/dL (ref 6–23)
CO2: 26 mEq/L (ref 19–32)
Calcium: 9.1 mg/dL (ref 8.4–10.5)
Chloride: 106 mEq/L (ref 96–112)
Creatinine, Ser: 0.75 mg/dL (ref 0.40–1.20)
GFR: 73.9 mL/min (ref >60.00)
Glucose, Bld: 100 mg/dL — ABNORMAL HIGH (ref 70–99)
Potassium: 4.2 mEq/L (ref 3.5–5.1)
Sodium: 139 mEq/L (ref 135–145)

## 2022-12-03 LAB — HEPATIC FUNCTION PANEL
ALT: 14 U/L (ref 0–35)
AST: 21 U/L (ref 0–37)
Albumin: 3.9 g/dL (ref 3.5–5.2)
Alkaline Phosphatase: 90 U/L (ref 39–117)
Bilirubin, Direct: 0.1 mg/dL (ref 0.0–0.3)
Total Bilirubin: 0.6 mg/dL (ref 0.2–1.2)
Total Protein: 6.9 g/dL (ref 6.0–8.3)

## 2022-12-03 LAB — LIPASE: Lipase: 10 U/L — ABNORMAL LOW (ref 11.0–59.0)

## 2022-12-03 NOTE — Assessment & Plan Note (Signed)
Recent ER visit for this and confustion/ms change Symptoms are resolved Reviewed hospital records, lab results and studies in detail  Reassuring CT and MRI brain  Is drinking more fluids now and feeling better

## 2022-12-03 NOTE — Assessment & Plan Note (Signed)
On repeat- ua is clear No blood

## 2022-12-03 NOTE — Progress Notes (Unsigned)
Subjective:    Patient ID: Cheryl Joseph, female    DOB: 1941/07/03, 82 y.o.   MRN: [id number]  HPI Pt presents for f/u of ER visit for dehydration/ headache  Abdominal pain   Wt Readings from Last 3 Encounters:  12/03/22 200 lb 4 oz (90.8 kg)  11/05/22 199 lb 8 oz (90.5 kg)  10/25/22 201 lb (91.2 kg)   36.04 kg/m  Vitals:   12/03/22 1053  BP: (!) 142/76  Pulse: 64  Temp: 97.6 F (36.4 C)  SpO2: 96%    She presented to ER on 4/29 with confusion , headache and word finding difficulty since 10 am that day  Bp was 168/72 on presentation   No focal neuro changes otherwise No facial droop or weakness   Last metabolic panel Lab Results  Component Value Date   GLUCOSE 157 (H) 11/22/2022   NA 142 11/22/2022   K 3.8 11/22/2022   CL 108 11/22/2022   CO2 26 11/22/2022   BUN 20 11/22/2022   CREATININE 0.76 11/22/2022   GFRNONAA >60 11/22/2022   CALCIUM 9.0 11/22/2022   PHOS 3.4 06/01/2010   PROT 7.0 11/22/2022   ALBUMIN 3.6 11/22/2022   BILITOT 0.7 11/22/2022   ALKPHOS 80 11/22/2022   AST 23 11/22/2022   ALT 15 11/22/2022   ANIONGAP 8 11/22/2022   Lab Results  Component Value Date   WBC 7.5 11/22/2022   HGB 15.5 (H) 11/22/2022   HCT 47.7 (H) 11/22/2022   MCV 95.8 11/22/2022   PLT 227 11/22/2022   Lab Results  Component Value Date   INR 1.0 11/22/2022   INR 1.0 12/31/2021   EKG NSR rate of 62  Lab Results  Component Value Date   COLORU yellow 01/29/2022   CLARITYU cloudy 01/29/2022   GLUCOSEUR Negative 01/29/2022   BILIRUBINUR NEGATIVE 11/22/2022   KETONESU Negative 01/29/2022   SPECGRAV 1.020 01/29/2022   RBCUR small 01/29/2022   PHUR 5.0 01/29/2022   PROTEINUR NEGATIVE 11/22/2022   UROBILINOGEN 0.2 01/29/2022   LEUKOCYTESUR NEGATIVE 11/22/2022    Reassuring CT and MRI   MR Brain W and Wo Contrast  Result Date: 11/22/2022 CLINICAL DATA:  Neuro deficit, acute, stroke suspected. Vertigo, weakness and headache. EXAM: MRI HEAD WITHOUT AND  WITH CONTRAST TECHNIQUE: Multiplanar, multiecho pulse sequences of the brain and surrounding structures were obtained without and with intravenous contrast. CONTRAST:  8mL GADAVIST GADOBUTROL 1 MMOL/ML IV SOLN COMPARISON:  Head CT 11/22/2022.  MRI brain 01/20/2022. FINDINGS: Brain: No acute infarct or hemorrhage. Unchanged moderate chronic small-vessel disease. No hydrocephalus or extra-axial collection. No mass or midline shift. No foci of abnormal susceptibility. Vascular: Normal flow voids. Skull and upper cervical spine: Normal marrow signal. Sinuses/Orbits: Unremarkable. Other: None. IMPRESSION: 1. No acute intracranial process. 2. Unchanged moderate chronic small-vessel disease. Electronically Signed   By: Orvan Falconer M.D.   On: 11/22/2022 20:30   CT HEAD WO CONTRAST  Result Date: 11/22/2022 CLINICAL DATA:  Headaches EXAM: CT HEAD WITHOUT CONTRAST TECHNIQUE: Contiguous axial images were obtained from the base of the skull through the vertex without intravenous contrast. RADIATION DOSE REDUCTION: This exam was performed according to the departmental dose-optimization program which includes automated exposure control, adjustment of the mA and/or kV according to patient size and/or use of iterative reconstruction technique. COMPARISON:  12/31/2021 FINDINGS: Brain: No acute intracranial findings are seen. There are no signs of bleeding within the cranium. There is decreased density in periventricular white matter. Cortical sulci are prominent. Possible  old subcentimeter lacunar infarcts are seen in basal ganglia. There is no focal edema or mass effect. Vascular: Scattered arterial calcifications are seen. Skull: No acute findings are seen. Sinuses/Orbits: Unremarkable. Other: None. IMPRESSION: No acute intracranial findings are seen in noncontrast CT brain. Electronically Signed   By: Ernie Avena M.D.   On: 11/22/2022 12:22    Does take asa daily   HTN BP Readings from Last 3 Encounters:   12/03/22 132/80  11/22/22 (!) 144/78  11/05/22 116/72   Pulse Readings from Last 3 Encounters:  12/03/22 64  11/22/22 71  11/05/22 64    Amlodipine 5 mg daily  Has past h/o cva  She checks at home   Takes crestor 20 mg every other day   Lab Results  Component Value Date   CHOL 168 10/29/2022   HDL 47.50 10/29/2022   LDLCALC 88 10/29/2022   LDLDIRECT 135.0 02/25/2022   TRIG 165.0 (H) 10/29/2022   CHOLHDL 4 10/29/2022     In retrospect she thinks she is close to dehydrated much of the time  Does not always pay attn to thirst / gets busy  Was told to drink propel   Headache is better- slight in am and then better after she eats and drinks   Since Wednesday she does not feel as well  Stomach hurts - around belly button  Sometimes worse on R  This happens intermittently  ? If scar tissue from hernia  Bloated  No diarrhea  Slight nausea   Stomach tends to be upset in am / indigestion Diarrhea sometimes /not today Occ immodium  Repeat ua  Lab Results  Component Value Date   COLORU Yellow 12/03/2022   CLARITYU Clear 12/03/2022   GLUCOSEUR Negative 12/03/2022   BILIRUBINUR Negative 12/03/2022   KETONESU Negative 12/03/2022   SPECGRAV 1.020 12/03/2022   RBCUR Negative 12/03/2022   PHUR 5.5 12/03/2022   PROTEINUR Negative 12/03/2022   UROBILINOGEN 0.2 12/03/2022   LEUKOCYTESUR Negative 12/03/2022     Patient Active Problem List   Diagnosis Date Noted   Abdominal pain, diffuse 12/03/2022   Dehydration 12/03/2022   Contact dermatitis 09/28/2022   Chronic headache 02/25/2022   Snoring 02/25/2022   Microscopic hematuria 01/29/2022   History of stroke 12/31/2021   Umbilical hernia 01/05/2021   Pre-operative general physical examination 07/06/2019   Estrogen deficiency 05/25/2016   Routine general medical examination at a health care facility 11/23/2015   Hammertoe 10/28/2015   Diverticulosis of colon without hemorrhage 02/14/2015   Colon cancer  screening 08/19/2014   Essential hypertension 02/25/2014   Encounter for Medicare annual wellness exam 07/16/2013   Prediabetes 05/14/2009   Rheumatoid arthritis with positive rheumatoid factor (HCC) 05/13/2008   Osteopenia 03/07/2007   Hyperlipidemia 01/03/2007   Obesity (BMI 30-39.9) 01/03/2007   ALLERGIC RHINITIS, SEASONAL 01/03/2007   ASTHMA 01/03/2007   OVERACTIVE BLADDER 01/03/2007   Past Medical History:  Diagnosis Date   Anginal pain (HCC)    Arthritis    RA hands(seronegative)   Asthma    Back pain    Cancer (HCC)    skin   Cataract    HPV in female 1996   HPV with colposcopy (all neg paps since)   Hyperlipidemia    Hypertension    Incarcerated ventral hernia    Obesity    Osteopenia    Pre-diabetes    Shortness of breath dyspnea    Shoulder pain    Past Surgical History:  Procedure Laterality Date  CATARACT EXTRACTION, BILATERAL Bilateral    COLONOSCOPY  06/01/2004   COLONOSCOPY WITH PROPOFOL N/A 05/01/2015   Procedure: COLONOSCOPY WITH PROPOFOL;  Surgeon: Scot Jun, MD;  Location: [facility] ENDOSCOPY;  Service: Endoscopy;  Laterality: N/A;   CORRECTION HAMMER TOE Right    2nd toe   JOINT REPLACEMENT Right    right knee replacement January 2021   SQUAMOUS CELL CARCINOMA EXCISION Left 12/2017   left arm   TONSILLECTOMY     VAGINAL HYSTERECTOMY  2015   with pelvic organ prolapse surgery    XI ROBOTIC ASSISTED VENTRAL HERNIA N/A 06/10/2021   Procedure: XI ROBOTIC ASSISTED VENTRAL HERNIA;  Surgeon: Carolan Shiver, MD;  Location: [facility] ORS;  Service: General;  Laterality: N/A;   Social History   Tobacco Use   Smoking status: Never   Smokeless tobacco: Never  Vaping Use   Vaping Use: Never used  Substance Use Topics   Alcohol use: No    Alcohol/week: 0.0 standard drinks of alcohol   Drug use: No   Family History  Problem Relation Age of Onset   Diabetes Mother    Hyperlipidemia Mother    Heart disease Mother    Hypertension Mother     Obesity Mother    Kyphosis Mother    Heart disease Father    Hypertension Father    Cancer Father        lung Cancer smoker   Diabetes Daughter    Kyphosis Sister    Breast cancer Neg Hx    Allergies  Allergen Reactions   Atorvastatin     Headache    Tolterodine Tartrate Other (See Comments)    Unknown (back ache)   Vesicare [Solifenacin Succinate]     Eye problems    Zetia [Ezetimibe]     Headache    Current Outpatient Medications on File Prior to Visit  Medication Sig Dispense Refill   acetaminophen (TYLENOL) 500 MG tablet Take 1,000 mg by mouth every 6 (six) hours as needed for mild pain.     albuterol (VENTOLIN HFA) 108 (90 Base) MCG/ACT inhaler INHALE TWO PUFFS BY MOUTH EVERY 4 HOURS AS NEEDED FOR WHEEZING AND 2 PUFFS BEFORE EXERCISE OR EXPOSURE TO COLD AIR 18 each 2   amLODipine (NORVASC) 5 MG tablet Take 1 tablet (5 mg total) by mouth daily. 90 tablet 3   aspirin EC 81 MG tablet Take 1 tablet (81 mg total) by mouth daily. Swallow whole.     b complex vitamins tablet Take 1 tablet by mouth every Monday, Wednesday, and Friday.     Cholecalciferol (VITAMIN D3 PO) Take 2 tablets by mouth daily.     Hyaluronic Acid-Vitamin C (HYALURONIC ACID PO) Take 1 tablet by mouth every Monday, Wednesday, and Friday.     montelukast (SINGULAIR) 10 MG tablet Take 1 tablet (10 mg total) by mouth at bedtime. 90 tablet 3   Multiple Vitamin (MULTIVITAMIN) capsule Take 1 capsule by mouth daily. Reported on 12/18/2015     Omega-3 Fatty Acids (FISH OIL PO) Take 1 capsule by mouth daily. Reported on 12/18/2015     OVER THE COUNTER MEDICATION Take 2 capsules by mouth daily. BONE UP     oxybutynin (OXYTROL) 3.9 MG/24HR Place 1 patch onto the skin once a week. Sunday.  Home med.     Probiotic Product (PROBIOTIC DAILY PO) Take 1 tablet by mouth daily.     Pumpkin Seed-Soy Germ (AZO BLADDER CONTROL/GO-LESS PO) Take 1 capsule by mouth at bedtime.  rosuvastatin (CRESTOR) 20 MG tablet Take 1 tablet (20  mg total) by mouth every other day. Takes 3 days per week 45 tablet 3   simvastatin (ZOCOR) 40 MG tablet Take 1 tablet (40 mg total) by mouth every other day. 45 tablet 3   TURMERIC PO Take 720 mg by mouth every Monday, Wednesday, and Friday.     Ubiquinol 100 MG CAPS Take 1 capsule by mouth every Monday, Wednesday, and Friday.     No current facility-administered medications on file prior to visit.    Review of Systems  Constitutional:  Positive for fatigue. Negative for activity change, appetite change, fever and unexpected weight change.  HENT:  Negative for congestion, ear pain, rhinorrhea, sinus pressure and sore throat.   Eyes:  Negative for pain, redness and visual disturbance.  Respiratory:  Negative for cough, shortness of breath and wheezing.   Cardiovascular:  Negative for chest pain and palpitations.  Gastrointestinal:  Positive for abdominal pain and nausea. Negative for anal bleeding, blood in stool, constipation, diarrhea, rectal pain and vomiting.       Bloating but no distension   Endocrine: Negative for polydipsia and polyuria.  Genitourinary:  Negative for dysuria, frequency and urgency.  Musculoskeletal:  Negative for arthralgias, back pain and myalgias.  Skin:  Negative for pallor and rash.  Allergic/Immunologic: Negative for environmental allergies.  Neurological:  Negative for dizziness, syncope and headaches.  Hematological:  Negative for adenopathy. Does not bruise/bleed easily.  Psychiatric/Behavioral:  Negative for decreased concentration and dysphoric mood. The patient is not nervous/anxious.        Objective:   Physical Exam        Assessment & Plan:   Problem List Items Addressed This Visit       Cardiovascular and Mediastinum   Essential hypertension    bp in fair control at this time  BP Readings from Last 1 Encounters:  12/03/22 132/80  No changes needed Most recent labs reviewed  Disc lifstyle change with low sodium diet and exercise   Controlled by amlodipine 5 mg daily in setting of past CVA      Relevant Orders   CBC with Differential/Platelet   Basic metabolic panel     Genitourinary   Microscopic hematuria    On repeat- ua is clear No blood       Relevant Orders   POCT Urinalysis Dipstick (Automated) (Completed)   POCT UA - Microscopic Only     Other   Abdominal pain, diffuse    Pt indicates this may be intermittent with some bloating  Has had hernia surgery and hyst in the past  Slt nausea  Very reassuring exam today -not tender Neg ua Labs ordered Discussed ER precautions May be worth a trial of pepcid -discussed this       Relevant Orders   CBC with Differential/Platelet   Basic metabolic panel   Hepatic function panel   Lipase   Chronic headache    This improved with better hydratoin  Reassuring exam  Discussed plan for more fluids - aim for 64 oz if able /mostly water  This may help her feel better in general      Dehydration - Primary    Recent ER visit for this and confustion/ms change Symptoms are resolved Reviewed hospital records, lab results and studies in detail  Reassuring CT and MRI brain  Is drinking more fluids now and feeling better        Relevant Orders   Basic  metabolic panel

## 2022-12-03 NOTE — Patient Instructions (Addendum)
We want to aim 64 oz of fluid per day /mostly water  Some other drinks are ok also   You can try some pepcid 10-20 mg daily over the counter if you have indigestion  Labs today  Ua today    Try to eat regularly   Lab today  Urinalysis today    If your abdominal pain worsens or gets more severe -go to the ER and let us know

## 2022-12-03 NOTE — Transitions of Care (Post Inpatient/ED Visit) (Signed)
12/03/2022  Name: Cheryl Joseph MRN: [id number] DOB: [birth year]  Today's TOC FU Call Status: Today's TOC FU Call Status:: Successful TOC FU Call Competed TOC FU Call Complete Date: 12/03/22  Red on EMMI-ED Discharge Alert Date & Reason:11/24/22 "Scheduled follow-up appt? No"  Transition Care Management Follow-up Telephone Call Date of Discharge: 11/22/22 Discharge Facility: [facility]) Type of Discharge: Emergency Department Reason for ED Visit: Other: ("dehydration") How have you been since you were released from the hospital?: Better (pt states she is feeling a little better-trying to drink more fluids as suggested by ED MD. She is drinking at least one Propel drink a day. Appetite decreased. She repors some stomach pain for the past few days. LBM was this am. Denies any N&V.) Any questions or concerns?: No  Items Reviewed: Did you receive and understand the discharge instructions provided?: Yes Medications obtained,verified, and reconciled?: Yes (Medications Reviewed) Any new allergies since your discharge?: No Dietary orders reviewed?: Yes Type of Diet Ordered:: low saltheart healthy Do you have support at home?: Yes People in Home: spouse Name of Support/Comfort Primary Source: [place]  Medications Reviewed Today: Medications Reviewed Today     Reviewed by Charlyn Minerva, RN (Registered Nurse) on 12/03/22 at 0919  Med List Status: <None>   Medication Order Taking? Sig Documenting Provider Last Dose Status Informant  acetaminophen (TYLENOL) 500 MG tablet [id number] No Take 1,000 mg by mouth every 6 (six) hours as needed for mild pain. [provider] Taking Active Self  albuterol (VENTOLIN HFA) 108 (90 Base) MCG/ACT inhaler [id number] No INHALE TWO PUFFS BY MOUTH EVERY 4 HOURS AS NEEDED FOR WHEEZING AND 2 PUFFS BEFORE EXERCISE OR EXPOSURE TO COLD AIR Tower, Audrie Gallus, MD Taking Active Self  amLODipine (NORVASC) 5 MG tablet  [id number]  Take 1 tablet (5 mg total) by mouth daily. Tower, Audrie Gallus, MD  Active   aspirin EC 81 MG tablet [id number] No Take 1 tablet (81 mg total) by mouth daily. Swallow whole. Darlin Priestly, MD Taking Active   b complex vitamins tablet [id number] No Take 1 tablet by mouth every Monday, Wednesday, and Friday. [provider] Taking Active Self  Cholecalciferol (VITAMIN D3 PO) [id number] No Take 2 tablets by mouth daily. [provider] Taking Active Self  Hyaluronic Acid-Vitamin C (HYALURONIC ACID PO) [id number] No Take 1 tablet by mouth every Monday, Wednesday, and Friday. [provider] Taking Active Self           Med Note Phebe Colla Jun 01, 2021  4:06 PM)    montelukast (SINGULAIR) 10 MG tablet [id number]  Take 1 tablet (10 mg total) by mouth at bedtime. Tower, Audrie Gallus, MD  Active   Multiple Vitamin (MULTIVITAMIN) capsule [id number] No Take 1 capsule by mouth daily. Reported on 12/18/2015 [provider] Taking Active Self  Omega-3 Fatty Acids (FISH OIL PO) [id number] No Take 1 capsule by mouth daily. Reported on 12/18/2015 [provider] Taking Active Self  OVER THE COUNTER MEDICATION [id number] No Take 2 capsules by mouth daily. BONE UP [provider] Taking Active Self  oxybutynin (OXYTROL) 3.9 MG/24HR [id number] No Place 1 patch onto the skin once a week. Sunday.  Home med. Darlin Priestly, MD Taking Active   Probiotic Product (PROBIOTIC DAILY PO) [id number] No Take 1 tablet by mouth daily. [provider] Taking Active Self  Pumpkin Seed-Soy Germ (AZO BLADDER CONTROL/GO-LESS PO) [id number] No Take 1 capsule by mouth at bedtime. [provider] Taking Active Self           Med Note Dorathy Daft, BRENDA L   Mon Oct 25, 2022 10:05 AM) Taking every other night  rosuvastatin (CRESTOR) 20 MG tablet [id number]  Take 1 tablet (20 mg total) by mouth every other day. Takes 3 days per week Tower, Audrie Gallus, MD  Active   simvastatin (ZOCOR)  40 MG tablet [id number]  Take 1 tablet (40 mg total) by mouth every other day. Tower, Audrie Gallus, MD  Active   TURMERIC PO [id number] No Take 720 mg by mouth every Monday, Wednesday, and Friday. [provider] Taking Active Self           Med Note Phebe Colla Jun 01, 2021  4:10 PM)    Ubiquinol 100 MG CAPS [id number] No Take 1 capsule by mouth every Monday, Wednesday, and Friday. [provider] Taking Active Self            Home Care and Equipment/Supplies: Were Home Health Services Ordered?: NA Any new equipment or medical supplies ordered?: NA  Functional Questionnaire: Do you need assistance with bathing/showering or dressing?: No Do you need assistance with meal preparation?: No Do you need assistance with eating?: No Do you have difficulty maintaining continence: No Do you need assistance with getting out of bed/getting out of a chair/moving?: No Do you have difficulty managing or taking your medications?: No  Follow up appointments reviewed: PCP Follow-up appointment confirmed?: Yes Date of PCP follow-up appointment?: 12/03/22 Follow-up Provider: [facility] Follow-up appointment confirmed?: NA Do you need transportation to your follow-up appointment?: No Do you understand care options if your condition(s) worsen?: Yes-patient verbalized understanding   TOC Interventions Today    Flowsheet Row Most Recent Value  TOC Interventions   TOC Interventions Discussed/Reviewed TOC Interventions Discussed, S/S of infection      Interventions Today    Flowsheet Row Most Recent Value  General Interventions   General Interventions Discussed/Reviewed General Interventions Discussed, Doctor Visits  Doctor Visits Discussed/Reviewed PCP, Doctor Visits Discussed  PCP/Specialist Visits Compliance with follow-up visit  Education Interventions   Education Provided Provided Education  Provided Verbal Education On Nutrition, Medication,  When to see the doctor  Nutrition Interventions   Nutrition Discussed/Reviewed Nutrition Discussed, Fluid intake, Decreasing salt, Increaing proteins  [discussed ways to stay hydrated]  Pharmacy Interventions   Pharmacy Dicussed/Reviewed Pharmacy Topics Discussed, Medications and their functions  Safety Interventions   Safety Discussed/Reviewed Safety Discussed        Alessandra Grout [facility] Care Management Care Management Community Coordinator Direct Phone: [phone] Toll Free: [phone] Fax: [phone]

## 2022-12-03 NOTE — Assessment & Plan Note (Signed)
This improved with better hydratoin  Reassuring exam  Discussed plan for more fluids - aim for 64 oz if able /mostly water  This may help her feel better in general

## 2022-12-03 NOTE — Assessment & Plan Note (Signed)
bp in fair control at this time  BP Readings from Last 1 Encounters:  12/03/22 132/80   No changes needed Most recent labs reviewed  Disc lifstyle change with low sodium diet and exercise  Controlled by amlodipine 5 mg daily in setting of past CVA

## 2022-12-03 NOTE — Assessment & Plan Note (Signed)
Pt indicates this may be intermittent with some bloating  Has had hernia surgery and hyst in the past  Slt nausea  Very reassuring exam today -not tender Neg ua Labs ordered Discussed ER precautions May be worth a trial of pepcid -discussed this

## 2022-12-13 ENCOUNTER — Ambulatory Visit (INDEPENDENT_AMBULATORY_CARE_PROVIDER_SITE_OTHER): Payer: Medicare HMO | Admitting: Internal Medicine

## 2022-12-13 ENCOUNTER — Encounter: Payer: Self-pay | Admitting: Internal Medicine

## 2022-12-13 ENCOUNTER — Ambulatory Visit (INDEPENDENT_AMBULATORY_CARE_PROVIDER_SITE_OTHER)
Admission: RE | Admit: 2022-12-13 | Discharge: 2022-12-13 | Disposition: A | Discharge status: Home / Self Care | Payer: Medicare HMO | Source: Ambulatory Visit | Attending: Internal Medicine | Admitting: Internal Medicine

## 2022-12-13 VITALS — BP 104/68 | HR 92 | Temp 97.2°F | Ht 62.5 in | Wt 198.0 lb

## 2022-12-13 DIAGNOSIS — R059 Cough, unspecified: Secondary | ICD-10-CM | POA: Diagnosis not present

## 2022-12-13 DIAGNOSIS — R051 Acute cough: Secondary | ICD-10-CM | POA: Diagnosis not present

## 2022-12-13 DIAGNOSIS — J45909 Unspecified asthma, uncomplicated: Secondary | ICD-10-CM | POA: Diagnosis not present

## 2022-12-13 DIAGNOSIS — L570 Actinic keratosis: Secondary | ICD-10-CM | POA: Diagnosis not present

## 2022-12-13 DIAGNOSIS — Z85828 Personal history of other malignant neoplasm of skin: Secondary | ICD-10-CM | POA: Diagnosis not present

## 2022-12-13 DIAGNOSIS — Z08 Encounter for follow-up examination after completed treatment for malignant neoplasm: Secondary | ICD-10-CM | POA: Diagnosis not present

## 2022-12-13 DIAGNOSIS — L821 Other seborrheic keratosis: Secondary | ICD-10-CM | POA: Diagnosis not present

## 2022-12-13 NOTE — Assessment & Plan Note (Addendum)
Not overly sick but husband had similar illness then diagnosed with pneumonia Asthma type picture Will check CXR--shows some interstitial (chronic changes per radiologist)--but some look new from 2019. No pneumonia  She will use tylenol if needed and the albuterol If worsens, would try doxycycline 100 bid x 7 days

## 2022-12-13 NOTE — Progress Notes (Signed)
Subjective:    Patient ID: Cheryl Joseph, female    DOB: 04-26-1941, 82 y.o.   MRN: [id number]  HPI Here due to respiratory infection  Started with cough 3 days ago Husband with same cough fot several weeks--diagnosed with pneumonia (originally just "asthma" per the [place]) Doesn't feel that bad No fever Cough is dry Slight headache--top of head No shakes. Did have chills once No SOB--not exercising Initial sore throat--gone now No ear pain--but they are stopped up  Using cough syrup--may have helped some  Montelukast for asthma Hasn't used the albuterol inhaler  Current Outpatient Medications on File Prior to Visit  Medication Sig Dispense Refill   acetaminophen (TYLENOL) 500 MG tablet Take 1,000 mg by mouth every 6 (six) hours as needed for mild pain.     albuterol (VENTOLIN HFA) 108 (90 Base) MCG/ACT inhaler INHALE TWO PUFFS BY MOUTH EVERY 4 HOURS AS NEEDED FOR WHEEZING AND 2 PUFFS BEFORE EXERCISE OR EXPOSURE TO COLD AIR 18 each 2   amLODipine (NORVASC) 5 MG tablet Take 1 tablet (5 mg total) by mouth daily. 90 tablet 3   aspirin EC 81 MG tablet Take 1 tablet (81 mg total) by mouth daily. Swallow whole.     b complex vitamins tablet Take 1 tablet by mouth every Monday, Wednesday, and Friday.     Cholecalciferol (VITAMIN D3 PO) Take 2 tablets by mouth daily.     Hyaluronic Acid-Vitamin C (HYALURONIC ACID PO) Take 1 tablet by mouth every Monday, Wednesday, and Friday.     montelukast (SINGULAIR) 10 MG tablet Take 1 tablet (10 mg total) by mouth at bedtime. 90 tablet 3   Multiple Vitamin (MULTIVITAMIN) capsule Take 1 capsule by mouth daily. Reported on 12/18/2015     Omega-3 Fatty Acids (FISH OIL PO) Take 1 capsule by mouth daily. Reported on 12/18/2015     OVER THE COUNTER MEDICATION Take 2 capsules by mouth daily. BONE UP     oxybutynin (OXYTROL) 3.9 MG/24HR Place 1 patch onto the skin once a week. Sunday.  Home med.     Probiotic Product (PROBIOTIC DAILY PO) Take 1 tablet by  mouth daily.     Pumpkin Seed-Soy Germ (AZO BLADDER CONTROL/GO-LESS PO) Take 1 capsule by mouth at bedtime.     rosuvastatin (CRESTOR) 20 MG tablet Take 1 tablet (20 mg total) by mouth every other day. Takes 3 days per week 45 tablet 3   simvastatin (ZOCOR) 40 MG tablet Take 1 tablet (40 mg total) by mouth every other day. 45 tablet 3   TURMERIC PO Take 720 mg by mouth every Monday, Wednesday, and Friday.     Ubiquinol 100 MG CAPS Take 1 capsule by mouth every Monday, Wednesday, and Friday.     No current facility-administered medications on file prior to visit.    Allergies  Allergen Reactions   Atorvastatin     Headache    Tolterodine Tartrate Other (See Comments)    Unknown (back ache)   Vesicare [Solifenacin Succinate]     Eye problems    Zetia [Ezetimibe]     Headache     Past Medical History:  Diagnosis Date   Anginal pain (HCC)    Arthritis    RA hands(seronegative)   Asthma    Back pain    Cancer (HCC)    skin   Cataract    HPV in female 1996   HPV with colposcopy (all neg paps since)   Hyperlipidemia    Hypertension  Incarcerated ventral hernia    Obesity    Osteopenia    Pre-diabetes    Shortness of breath dyspnea    Shoulder pain     Past Surgical History:  Procedure Laterality Date   CATARACT EXTRACTION, BILATERAL Bilateral    COLONOSCOPY  06/01/2004   COLONOSCOPY WITH PROPOFOL N/A 05/01/2015   Procedure: COLONOSCOPY WITH PROPOFOL;  Surgeon: Scot Jun, MD;  Location: [facility] ENDOSCOPY;  Service: Endoscopy;  Laterality: N/A;   CORRECTION HAMMER TOE Right    2nd toe   JOINT REPLACEMENT Right    right knee replacement January 2021   SQUAMOUS CELL CARCINOMA EXCISION Left 12/2017   left arm   TONSILLECTOMY     VAGINAL HYSTERECTOMY  2015   with pelvic organ prolapse surgery    XI ROBOTIC ASSISTED VENTRAL HERNIA N/A 06/10/2021   Procedure: XI ROBOTIC ASSISTED VENTRAL HERNIA;  Surgeon: Carolan Shiver, MD;  Location: [facility] ORS;  Service:  General;  Laterality: N/A;    Family History  Problem Relation Age of Onset   Diabetes Mother    Hyperlipidemia Mother    Heart disease Mother    Hypertension Mother    Obesity Mother    Kyphosis Mother    Heart disease Father    Hypertension Father    Cancer Father        lung Cancer smoker   Diabetes Daughter    Kyphosis Sister    Breast cancer Neg Hx     Social History   Socioeconomic History   Marital status: Married    Spouse name: Rudell Cobb   Number of children: 3   Years of education: Not on file   Highest education level: Not on file  Occupational History   Not on file  Tobacco Use   Smoking status: Never   Smokeless tobacco: Never  Vaping Use   Vaping Use: Never used  Substance and Sexual Activity   Alcohol use: No    Alcohol/week: 0.0 standard drinks of alcohol   Drug use: No   Sexual activity: Yes  Other Topics Concern   Not on file  Social History Narrative   Remarried.   19 grandchildren between pt and husband.   7 great grandchildren.   Social Determinants of Health   Financial Resource Strain: Low Risk  (10/25/2022)   Overall Financial Resource Strain (CARDIA)    Difficulty of Paying Living Expenses: Not hard at all  Food Insecurity: No Food Insecurity (10/25/2022)   Hunger Vital Sign    Worried About Running Out of Food in the Last Year: Never true    Ran Out of Food in the Last Year: Never true  Transportation Needs: No Transportation Needs (10/25/2022)   PRAPARE - Administrator, Civil Service (Medical): No    Lack of Transportation (Non-Medical): No  Physical Activity: Sufficiently Active (10/25/2022)   Exercise Vital Sign    Days of Exercise per Week: 5 days    Minutes of Exercise per Session: 30 min  Stress: No Stress Concern Present (10/25/2022)   [employer] of Occupational Health - Occupational Stress Questionnaire    Feeling of Stress : Not at all  Social Connections: Socially Integrated (10/25/2022)   Social Connection and  Isolation Panel [NHANES]    Frequency of Communication with Friends and Family: More than three times a week    Frequency of Social Gatherings with Friends and Family: More than three times a week    Attends Religious Services: More than 4 times  per year    Active Member of Clubs or Organizations: Yes    Attends Banker Meetings: 1 to 4 times per year    Marital Status: Married  Catering manager Violence: Not At Risk (10/25/2022)   Humiliation, Afraid, Rape, and Kick questionnaire    Fear of Current or Ex-Partner: No    Emotionally Abused: No    Physically Abused: No    Sexually Abused: No   Review of Systems No N/V No loss or taste or smell Eating some--appetite off some     Objective:   Physical Exam Constitutional:      Appearance: Normal appearance.  HENT:     Head:     Comments: No sinus tenderness    Right Ear: Tympanic membrane and ear canal normal.     Left Ear: Tympanic membrane and ear canal normal.     Mouth/Throat:     Comments: Mild pharyngeal injection without exudate Pulmonary:     Effort: Pulmonary effort is normal.     Breath sounds: No rales.     Comments: Slightly prolonged expiratory phase with exp rhonchi Not really tight Musculoskeletal:     Cervical back: Neck supple.  Lymphadenopathy:     Cervical: No cervical adenopathy.  Neurological:     Mental Status: She is alert.            Assessment & Plan:

## 2022-12-17 ENCOUNTER — Other Ambulatory Visit: Payer: Self-pay | Admitting: Family Medicine

## 2022-12-17 MED ORDER — DOXYCYCLINE HYCLATE 100 MG PO TABS: 100.0000 mg | ORAL_TABLET | Freq: Two times a day (BID) | ORAL | 0 refills | Status: DC

## 2022-12-17 MED ORDER — ALBUTEROL SULFATE HFA 108 (90 BASE) MCG/ACT IN AERS: INHALATION_SPRAY | RESPIRATORY_TRACT | 0 refills | Status: DC

## 2022-12-17 NOTE — Telephone Encounter (Signed)
Patient called the office, stated she was seen 5/20 by Dr. Alphonsus Sias for cough and upper respiratory issues. Says her cough not getting better, she has been taking cough syrup but wants to know if anything different can be given to help. Requested a cal back whenever possible. Please advise [phone].

## 2022-12-17 NOTE — Telephone Encounter (Signed)
If not improving he recommended doxycyline  I pended it  Please send to pharmacy of choice   Follow up if no improvement  ER precauttions if worse cough/sob/wheeze or fever

## 2022-12-17 NOTE — Telephone Encounter (Signed)
Dr. Alphonsus Sias out of the office, will route to PCP

## 2022-12-17 NOTE — Telephone Encounter (Signed)
Pt notified of Dr. Royden Purl recommendations and comments and Rx sent to pharmacy. ER precautions given also

## 2022-12-24 DIAGNOSIS — H26493 Other secondary cataract, bilateral: Secondary | ICD-10-CM | POA: Diagnosis not present

## 2022-12-24 DIAGNOSIS — H43813 Vitreous degeneration, bilateral: Secondary | ICD-10-CM | POA: Diagnosis not present

## 2022-12-24 DIAGNOSIS — M3501 Sicca syndrome with keratoconjunctivitis: Secondary | ICD-10-CM | POA: Diagnosis not present

## 2022-12-24 DIAGNOSIS — Z961 Presence of intraocular lens: Secondary | ICD-10-CM | POA: Diagnosis not present

## 2022-12-27 DIAGNOSIS — E782 Mixed hyperlipidemia: Secondary | ICD-10-CM | POA: Diagnosis not present

## 2022-12-27 DIAGNOSIS — I10 Essential (primary) hypertension: Secondary | ICD-10-CM | POA: Diagnosis not present

## 2022-12-27 DIAGNOSIS — Z8673 Personal history of transient ischemic attack (TIA), and cerebral infarction without residual deficits: Secondary | ICD-10-CM | POA: Diagnosis not present

## 2022-12-27 DIAGNOSIS — R0602 Shortness of breath: Secondary | ICD-10-CM | POA: Diagnosis not present

## 2022-12-27 DIAGNOSIS — E669 Obesity, unspecified: Secondary | ICD-10-CM | POA: Diagnosis not present

## 2022-12-27 DIAGNOSIS — R600 Localized edema: Secondary | ICD-10-CM | POA: Diagnosis not present

## 2022-12-28 ENCOUNTER — Ambulatory Visit (INDEPENDENT_AMBULATORY_CARE_PROVIDER_SITE_OTHER): Payer: Medicare HMO | Admitting: Family Medicine

## 2022-12-28 ENCOUNTER — Encounter: Payer: Self-pay | Admitting: Family Medicine

## 2022-12-28 VITALS — BP 98/60 | HR 65 | Temp 97.0°F | Ht 62.5 in | Wt 199.0 lb

## 2022-12-28 DIAGNOSIS — H6122 Impacted cerumen, left ear: Secondary | ICD-10-CM | POA: Diagnosis not present

## 2022-12-28 DIAGNOSIS — H6120 Impacted cerumen, unspecified ear: Secondary | ICD-10-CM | POA: Insufficient documentation

## 2022-12-28 DIAGNOSIS — Z1231 Encounter for screening mammogram for malignant neoplasm of breast: Secondary | ICD-10-CM | POA: Insufficient documentation

## 2022-12-28 NOTE — Patient Instructions (Addendum)
Your ear looks better after flushing  It will be watery and full feeling for another day   If pain returns/worsens or no improvement in hearing let us know     You have an order for:  []   2D Mammogram  [x]   3D Mammogram  []   Bone Density     Please call for appointment:   [x]   [facility]  [address] [place] [address]  [phone]  []   [facility])   [address]  [place], [place] [address]  [phone]  []   [facility]      [address] [place], [place]        [phone]         []   [facility]  [address] [place], [place]  [phone]  []  [facility]   [address]   [place], [place] [address]  [phone]  []  [facility]  [address] [place], [place] [address] [phone]    Make sure to wear two piece clothing  No lotions powders or deodorants the day of the appointment Make sure to bring picture ID and insurance card.  Bring list of medications you are currently taking including any supplements.   Schedule your screening mammogram through MyChart!   Select [facility] imaging sites can now be scheduled through MyChart.  Log into your MyChart account.  Go to 'Visit' (or 'Appointments' if  on mobile App) --> Schedule an  Appointment  Under 'Select a Reason for Visit' choose the Mammogram  Screening option.  Complete the pre-visit questions  and select the time and place that  best fits your schedule

## 2022-12-28 NOTE — Assessment & Plan Note (Signed)
Annual mammogram ordered Pt will call to schedule

## 2022-12-28 NOTE — Assessment & Plan Note (Addendum)
Left  Was bilateral-pt cleared R ear out with debrox at home    L ear irrigated with success today  Small flake of cerumen remains  Expect more improvement in hearing as water drains out  Update if not starting to improve in a week or if worsening

## 2022-12-28 NOTE — Progress Notes (Signed)
Subjective:    Patient ID: Cheryl Joseph, female    DOB: 1941/04/01, 82 y.o.   MRN: [id number]  HPI Pt presents for c/o ear fullness   Wt Readings from Last 3 Encounters:  12/28/22 199 lb (90.3 kg)  12/13/22 198 lb (89.8 kg)  12/03/22 200 lb 4 oz (90.8 kg)   35.82 kg/m    Using ear drops -not helping  Debrox   Hearing is worse  A bit of wax came out of R ear  None in L  Some pain in L ear   No cold symptoms   Still tired from last vital illness Gradually getting better   Patient Active Problem List   Diagnosis Date Noted   Encounter for screening mammogram for breast cancer 12/28/2022   Cough 12/13/2022   Abdominal pain, diffuse 12/03/2022   Dehydration 12/03/2022   Contact dermatitis 09/28/2022   Chronic headache 02/25/2022   Snoring 02/25/2022   Microscopic hematuria 01/29/2022   History of stroke 12/31/2021   Umbilical hernia 01/05/2021   Pre-operative general physical examination 07/06/2019   Estrogen deficiency 05/25/2016   Routine general medical examination at a health care facility 11/23/2015   Hammertoe 10/28/2015   Diverticulosis of colon without hemorrhage 02/14/2015   Colon cancer screening 08/19/2014   Essential hypertension 02/25/2014   Encounter for Medicare annual wellness exam 07/16/2013   Prediabetes 05/14/2009   Rheumatoid arthritis with positive rheumatoid factor (HCC) 05/13/2008   Osteopenia 03/07/2007   Hyperlipidemia 01/03/2007   Obesity (BMI 30-39.9) 01/03/2007   ALLERGIC RHINITIS, SEASONAL 01/03/2007   ASTHMA 01/03/2007   OVERACTIVE BLADDER 01/03/2007   Past Medical History:  Diagnosis Date   Anginal pain (HCC)    Arthritis    RA hands(seronegative)   Asthma    Back pain    Cancer (HCC)    skin   Cataract    HPV in female 1996   HPV with colposcopy (all neg paps since)   Hyperlipidemia    Hypertension    Incarcerated ventral hernia    Obesity    Osteopenia    Pre-diabetes    Shortness of breath dyspnea     Shoulder pain    Past Surgical History:  Procedure Laterality Date   CATARACT EXTRACTION, BILATERAL Bilateral    COLONOSCOPY  06/01/2004   COLONOSCOPY WITH PROPOFOL N/A 05/01/2015   Procedure: COLONOSCOPY WITH PROPOFOL;  Surgeon: Scot Jun, MD;  Location: [facility] ENDOSCOPY;  Service: Endoscopy;  Laterality: N/A;   CORRECTION HAMMER TOE Right    2nd toe   JOINT REPLACEMENT Right    right knee replacement January 2021   SQUAMOUS CELL CARCINOMA EXCISION Left 12/2017   left arm   TONSILLECTOMY     VAGINAL HYSTERECTOMY  2015   with pelvic organ prolapse surgery    XI ROBOTIC ASSISTED VENTRAL HERNIA N/A 06/10/2021   Procedure: XI ROBOTIC ASSISTED VENTRAL HERNIA;  Surgeon: Carolan Shiver, MD;  Location: [facility] ORS;  Service: General;  Laterality: N/A;   Social History   Tobacco Use   Smoking status: Never   Smokeless tobacco: Never  Vaping Use   Vaping Use: Never used  Substance Use Topics   Alcohol use: No    Alcohol/week: 0.0 standard drinks of alcohol   Drug use: No   Family History  Problem Relation Age of Onset   Diabetes Mother    Hyperlipidemia Mother    Heart disease Mother    Hypertension Mother    Obesity Mother    Kyphosis  Mother    Heart disease Father    Hypertension Father    Cancer Father        lung Cancer smoker   Diabetes Daughter    Kyphosis Sister    Breast cancer Neg Hx    Allergies  Allergen Reactions   Atorvastatin     Headache    Tolterodine Tartrate Other (See Comments)    Unknown (back ache)   Vesicare [Solifenacin Succinate]     Eye problems    Zetia [Ezetimibe]     Headache    Current Outpatient Medications on File Prior to Visit  Medication Sig Dispense Refill   acetaminophen (TYLENOL) 500 MG tablet Take 1,000 mg by mouth every 6 (six) hours as needed for mild pain.     albuterol (VENTOLIN HFA) 108 (90 Base) MCG/ACT inhaler INHALE TWO PUFFS BY MOUTH EVERY 4 HOURS AS NEEDED FOR WHEEZING AND 2 PUFFS BEFORE EXERCISE OR  EXPOSURE TO COLD AIR 18 each 0   amLODipine (NORVASC) 5 MG tablet Take 1 tablet (5 mg total) by mouth daily. 90 tablet 3   aspirin EC 81 MG tablet Take 1 tablet (81 mg total) by mouth daily. Swallow whole.     b complex vitamins tablet Take 1 tablet by mouth every Monday, Wednesday, and Friday.     Cholecalciferol (VITAMIN D3 PO) Take 2 tablets by mouth daily.     doxycycline (VIBRA-TABS) 100 MG tablet Take 1 tablet (100 mg total) by mouth 2 (two) times daily. 14 tablet 0   Hyaluronic Acid-Vitamin C (HYALURONIC ACID PO) Take 1 tablet by mouth every Monday, Wednesday, and Friday.     montelukast (SINGULAIR) 10 MG tablet Take 1 tablet (10 mg total) by mouth at bedtime. 90 tablet 3   Multiple Vitamin (MULTIVITAMIN) capsule Take 1 capsule by mouth daily. Reported on 12/18/2015     Omega-3 Fatty Acids (FISH OIL PO) Take 1 capsule by mouth daily. Reported on 12/18/2015     OVER THE COUNTER MEDICATION Take 2 capsules by mouth daily. BONE UP     oxybutynin (OXYTROL) 3.9 MG/24HR Place 1 patch onto the skin once a week. Sunday.  Home med.     Probiotic Product (PROBIOTIC DAILY PO) Take 1 tablet by mouth daily.     Pumpkin Seed-Soy Germ (AZO BLADDER CONTROL/GO-LESS PO) Take 1 capsule by mouth at bedtime.     rosuvastatin (CRESTOR) 20 MG tablet Take 1 tablet (20 mg total) by mouth every other day. Takes 3 days per week 45 tablet 3   simvastatin (ZOCOR) 40 MG tablet Take 1 tablet (40 mg total) by mouth every other day. 45 tablet 3   TURMERIC PO Take 720 mg by mouth every Monday, Wednesday, and Friday.     Ubiquinol 100 MG CAPS Take 1 capsule by mouth every Monday, Wednesday, and Friday.     No current facility-administered medications on file prior to visit.     Review of Systems  Constitutional:  Positive for fatigue. Negative for activity change, appetite change, fever and unexpected weight change.  HENT:  Positive for ear discharge, ear pain and hearing loss. Negative for congestion, rhinorrhea, sinus  pressure, sinus pain, sneezing and sore throat.   Eyes:  Negative for pain, redness and visual disturbance.  Respiratory:  Negative for cough, shortness of breath and wheezing.   Cardiovascular:  Negative for chest pain and palpitations.  Gastrointestinal:  Negative for abdominal pain, blood in stool, constipation and diarrhea.  Endocrine: Negative for polydipsia and polyuria.  Genitourinary:  Negative for dysuria, frequency and urgency.  Musculoskeletal:  Negative for arthralgias, back pain and myalgias.  Skin:  Negative for pallor and rash.  Allergic/Immunologic: Negative for environmental allergies.  Neurological:  Negative for dizziness, syncope and headaches.  Hematological:  Negative for adenopathy. Does not bruise/bleed easily.  Psychiatric/Behavioral:  Negative for decreased concentration and dysphoric mood. The patient is not nervous/anxious.        Objective:   Physical Exam Constitutional:      General: She is not in acute distress.    Appearance: Normal appearance. She is obese. She is not ill-appearing or diaphoretic.  HENT:     Head: Normocephalic and atraumatic.     Right Ear: Tympanic membrane, ear canal and external ear normal.     Left Ear: There is impacted cerumen.     Ears:     Comments: Small flake of cerumen in R ear canal   L ear canal- wet appearing cerumen impaction    Procedure: Cerumen Disimpaction left ear  After consent obtained  Warm water was applied and gentle ear lavage performed on left ear.    There were no complications and following the disimpaction the tympanic membrane were visible on the bilateral. Tympanic membranes are intact following the procedure.  Auditory canals are normal.  The patient reported relief of symptoms after removal of cerumen.       Nose: Nose normal.     Mouth/Throat:     Mouth: Mucous membranes are moist.     Pharynx: Oropharynx is clear.  Neurological:     Mental Status: She is alert.  Psychiatric:        Mood  and Affect: Mood normal.           Assessment & Plan:   Problem List Items Addressed This Visit       Nervous and Auditory   Cerumen impaction - Primary    Left  Was bilateral-pt cleared R ear out with debrox at home    L ear irrigated with success today  Small flake of cerumen remains  Expect more improvement in hearing as water drains out  Update if not starting to improve in a week or if worsening          Other   Encounter for screening mammogram for breast cancer    Annual mammogram ordered Pt will call to schedule      Relevant Orders   MM 3D SCREENING MAMMOGRAM BILATERAL BREAST

## 2023-01-10 ENCOUNTER — Ambulatory Visit
Admission: RE | Admit: 2023-01-10 | Discharge: 2023-01-10 | Disposition: A | Discharge status: Home / Self Care | Payer: Medicare HMO | Source: Ambulatory Visit | Attending: Family Medicine | Admitting: Family Medicine

## 2023-01-10 DIAGNOSIS — Z1231 Encounter for screening mammogram for malignant neoplasm of breast: Secondary | ICD-10-CM | POA: Diagnosis not present

## 2023-01-27 ENCOUNTER — Other Ambulatory Visit: Payer: Self-pay | Admitting: Family Medicine

## 2023-01-28 MED ORDER — SIMVASTATIN 40 MG PO TABS: 40.0000 mg | ORAL_TABLET | ORAL | 3 refills | Status: DC

## 2023-01-28 NOTE — Telephone Encounter (Signed)
Medication: Simvastatin 40 mg  Directions: take 1 tablet po every other day Last given: 08/24/22 Number refills: 3 Last o/v: 11/05/22, (Acute 12/28/22) Follow up: 10/27/23 Labs: 12/03/22

## 2023-01-28 NOTE — Addendum Note (Signed)
Addended by: Dorris Fetch on: 01/28/2023 01:21 PM   Modules accepted: Orders

## 2023-02-14 ENCOUNTER — Ambulatory Visit: Payer: Medicare HMO | Admitting: Family Medicine

## 2023-02-16 ENCOUNTER — Ambulatory Visit (INDEPENDENT_AMBULATORY_CARE_PROVIDER_SITE_OTHER)
Admission: RE | Admit: 2023-02-16 | Discharge: 2023-02-16 | Disposition: A | Discharge status: Home / Self Care | Payer: Medicare HMO | Source: Ambulatory Visit | Attending: Family Medicine | Admitting: Family Medicine

## 2023-02-16 ENCOUNTER — Ambulatory Visit: Payer: Medicare HMO | Admitting: Family Medicine

## 2023-02-16 ENCOUNTER — Encounter: Payer: Self-pay | Admitting: Family Medicine

## 2023-02-16 VITALS — BP 128/74 | HR 63 | Temp 97.7°F | Ht 62.5 in | Wt 200.2 lb

## 2023-02-16 DIAGNOSIS — S50861A Insect bite (nonvenomous) of right forearm, initial encounter: Secondary | ICD-10-CM | POA: Diagnosis not present

## 2023-02-16 DIAGNOSIS — M7989 Other specified soft tissue disorders: Secondary | ICD-10-CM | POA: Diagnosis not present

## 2023-02-16 DIAGNOSIS — M1811 Unilateral primary osteoarthritis of first carpometacarpal joint, right hand: Secondary | ICD-10-CM | POA: Diagnosis not present

## 2023-02-16 DIAGNOSIS — M059 Rheumatoid arthritis with rheumatoid factor, unspecified: Secondary | ICD-10-CM | POA: Diagnosis not present

## 2023-02-16 DIAGNOSIS — W57XXXA Bitten or stung by nonvenomous insect and other nonvenomous arthropods, initial encounter: Secondary | ICD-10-CM | POA: Insufficient documentation

## 2023-02-16 DIAGNOSIS — M65311 Trigger thumb, right thumb: Secondary | ICD-10-CM

## 2023-02-16 DIAGNOSIS — M79644 Pain in right finger(s): Secondary | ICD-10-CM

## 2023-02-16 DIAGNOSIS — M19041 Primary osteoarthritis, right hand: Secondary | ICD-10-CM | POA: Diagnosis not present

## 2023-02-16 DIAGNOSIS — M65319 Trigger thumb, unspecified thumb: Secondary | ICD-10-CM | POA: Insufficient documentation

## 2023-02-16 NOTE — Assessment & Plan Note (Signed)
This occurred last night Less likely sting (did not hurt at the time) Primarily itching last night  Erythema and bruising looks to be secondary to scratching  (she does take asa)  Feels better now  Does not appear to be infected  Encouraged her to monitor  Use cool compresses for swelling and if itch returns Cortisone cream is helpful

## 2023-02-16 NOTE — Progress Notes (Signed)
Subjective:    Patient ID: Cheryl Joseph, female    DOB: 11/20/1940, 82 y.o.   MRN: [id number]  HPI  Wt Readings from Last 3 Encounters:  02/16/23 200 lb 4 oz (90.8 kg)  12/28/22 199 lb (90.3 kg)  12/13/22 198 lb (89.8 kg)   36.04 kg/m  Vitals:   02/16/23 0820  BP: 128/74  Pulse: 63  Temp: 97.7 F (36.5 C)  SpO2: 95%    Pt presents with c/o swollen right thumb and insect sting 2 weeks  No trauma  No new reped motion or activity    Swollen Warm to the touch Painful  It clicks sometimes   Worse with activity  Lifting things  Driving -changing gears  Door knob   Occational wears a wrist brace    Cold water feels good  Has not taken anything for pain      She is left handed but still uses right hand a lot    Has history of RA with positive RF- in chart (rheumatology told her she did not have it)  No medications    Had a sting last night on her right arm  ? Bite or sting  Scratched/itching Red area   Patient Active Problem List   Diagnosis Date Noted   Pain of right thumb 02/16/2023   Trigger thumb 02/16/2023   Insect bite of right forearm 02/16/2023   Encounter for screening mammogram for breast cancer 12/28/2022   Cerumen impaction 12/28/2022   Cough 12/13/2022   Abdominal pain, diffuse 12/03/2022   Dehydration 12/03/2022   Contact dermatitis 09/28/2022   Chronic headache 02/25/2022   Snoring 02/25/2022   Microscopic hematuria 01/29/2022   History of stroke 12/31/2021   Umbilical hernia 01/05/2021   Pre-operative general physical examination 07/06/2019   Estrogen deficiency 05/25/2016   Routine general medical examination at a health care facility 11/23/2015   Hammertoe 10/28/2015   Diverticulosis of colon without hemorrhage 02/14/2015   Colon cancer screening 08/19/2014   Essential hypertension 02/25/2014   Encounter for Medicare annual wellness exam 07/16/2013   Prediabetes 05/14/2009   Rheumatoid arthritis with positive  rheumatoid factor (HCC) 05/13/2008   Osteopenia 03/07/2007   Hyperlipidemia 01/03/2007   Obesity (BMI 30-39.9) 01/03/2007   ALLERGIC RHINITIS, SEASONAL 01/03/2007   ASTHMA 01/03/2007   OVERACTIVE BLADDER 01/03/2007   Past Medical History:  Diagnosis Date   Anginal pain (HCC)    Arthritis    RA hands(seronegative)   Asthma    Back pain    Cancer (HCC)    skin   Cataract    HPV in female 1996   HPV with colposcopy (all neg paps since)   Hyperlipidemia    Hypertension    Incarcerated ventral hernia    Obesity    Osteopenia    Pre-diabetes    Shortness of breath dyspnea    Shoulder pain    Past Surgical History:  Procedure Laterality Date   CATARACT EXTRACTION, BILATERAL Bilateral    COLONOSCOPY  06/01/2004   COLONOSCOPY WITH PROPOFOL N/A 05/01/2015   Procedure: COLONOSCOPY WITH PROPOFOL;  Surgeon: Scot Jun, MD;  Location: [facility] ENDOSCOPY;  Service: Endoscopy;  Laterality: N/A;   CORRECTION HAMMER TOE Right    2nd toe   JOINT REPLACEMENT Right    right knee replacement January 2021   SQUAMOUS CELL CARCINOMA EXCISION Left 12/2017   left arm   TONSILLECTOMY     VAGINAL HYSTERECTOMY  2015   with pelvic organ prolapse surgery  XI ROBOTIC ASSISTED VENTRAL HERNIA N/A 06/10/2021   Procedure: XI ROBOTIC ASSISTED VENTRAL HERNIA;  Surgeon: Carolan Shiver, MD;  Location: [facility] ORS;  Service: General;  Laterality: N/A;   Social History   Tobacco Use   Smoking status: Never   Smokeless tobacco: Never  Vaping Use   Vaping status: Never Used  Substance Use Topics   Alcohol use: No    Alcohol/week: 0.0 standard drinks of alcohol   Drug use: No   Family History  Problem Relation Age of Onset   Diabetes Mother    Hyperlipidemia Mother    Heart disease Mother    Hypertension Mother    Obesity Mother    Kyphosis Mother    Heart disease Father    Hypertension Father    Cancer Father        lung Cancer smoker   Diabetes Daughter    Kyphosis Sister     Breast cancer Neg Hx    Allergies  Allergen Reactions   Atorvastatin     Headache    Tolterodine Tartrate Other (See Comments)    Unknown (back ache)   Vesicare [Solifenacin Succinate]     Eye problems    Zetia [Ezetimibe]     Headache    Current Outpatient Medications on File Prior to Visit  Medication Sig Dispense Refill   acetaminophen (TYLENOL) 500 MG tablet Take 1,000 mg by mouth every 6 (six) hours as needed for mild pain.     albuterol (VENTOLIN HFA) 108 (90 Base) MCG/ACT inhaler INHALE TWO PUFFS BY MOUTH EVERY 4 HOURS AS NEEDED FOR WHEEZING AND 2 PUFFS BEFORE EXERCISE OR EXPOSURE TO COLD AIR 18 each 0   amLODipine (NORVASC) 5 MG tablet Take 1 tablet (5 mg total) by mouth daily. 90 tablet 3   aspirin EC 81 MG tablet Take 1 tablet (81 mg total) by mouth daily. Swallow whole.     b complex vitamins tablet Take 1 tablet by mouth every Monday, Wednesday, and Friday.     Cholecalciferol (VITAMIN D3 PO) Take 2 tablets by mouth daily.     Hyaluronic Acid-Vitamin C (HYALURONIC ACID PO) Take 1 tablet by mouth every Monday, Wednesday, and Friday.     montelukast (SINGULAIR) 10 MG tablet Take 1 tablet (10 mg total) by mouth at bedtime. 90 tablet 3   Multiple Vitamin (MULTIVITAMIN) capsule Take 1 capsule by mouth daily. Reported on 12/18/2015     Omega-3 Fatty Acids (FISH OIL PO) Take 1 capsule by mouth daily. Reported on 12/18/2015     OVER THE COUNTER MEDICATION Take 2 capsules by mouth daily. BONE UP     oxybutynin (OXYTROL) 3.9 MG/24HR Place 1 patch onto the skin once a week. Sunday.  Home med.     Probiotic Product (PROBIOTIC DAILY PO) Take 1 tablet by mouth daily.     Pumpkin Seed-Soy Germ (AZO BLADDER CONTROL/GO-LESS PO) Take 1 capsule by mouth at bedtime.     rosuvastatin (CRESTOR) 20 MG tablet Take 1 tablet (20 mg total) by mouth every other day. Takes 3 days per week 45 tablet 3   simvastatin (ZOCOR) 40 MG tablet Take 1 tablet (40 mg total) by mouth every other day. 45 tablet 3    TURMERIC PO Take 720 mg by mouth every Monday, Wednesday, and Friday.     Ubiquinol 100 MG CAPS Take 1 capsule by mouth every Monday, Wednesday, and Friday.     No current facility-administered medications on file prior to visit.  Review of Systems  Constitutional:  Positive for fatigue. Negative for activity change, appetite change, fever and unexpected weight change.  HENT:  Negative for congestion, ear pain, rhinorrhea, sinus pressure and sore throat.   Eyes:  Negative for pain, redness and visual disturbance.  Respiratory:  Negative for cough, shortness of breath and wheezing.   Cardiovascular:  Negative for chest pain and palpitations.  Gastrointestinal:  Negative for abdominal pain, blood in stool, constipation and diarrhea.  Endocrine: Negative for polydipsia and polyuria.  Genitourinary:  Negative for dysuria, frequency and urgency.  Musculoskeletal:  Positive for arthralgias. Negative for back pain and myalgias.  Skin:  Negative for pallor and rash.  Allergic/Immunologic: Negative for environmental allergies.  Neurological:  Negative for dizziness, syncope and headaches.  Hematological:  Negative for adenopathy. Does not bruise/bleed easily.  Psychiatric/Behavioral:  Negative for decreased concentration and dysphoric mood. The patient is not nervous/anxious.        Objective:   Physical Exam Constitutional:      General: She is not in acute distress.    Appearance: Normal appearance. She is obese. She is not ill-appearing.  HENT:     Head: Normocephalic and atraumatic.  Eyes:     Conjunctiva/sclera: Conjunctivae normal.     Pupils: Pupils are equal, round, and reactive to light.  Cardiovascular:     Rate and Rhythm: Normal rate.  Pulmonary:     Effort: Pulmonary effort is normal. No respiratory distress.  Abdominal:     Palpations: There is no mass.  Musculoskeletal:     Right hand: Deformity, tenderness and bony tenderness present. No swelling. Normal range of  motion. Normal strength. Normal sensation. There is no disruption of two-point discrimination. Normal capillary refill. Normal pulse.     Comments: Some tenderness/mild in MCPs  -mostly in thumb  Some crepitus Some deformity of MCP joints and also distal pip (particularly index finger)  Normal strength   Triggering of thum at MCP noted with pain   No erythema, warmth or soft tissue swelling   Skin:    Coloration: Skin is not pale.     Findings: Erythema present. No rash.     Comments: Erythema and mild swelling with bruising or her lower right forearm from recent insect bite  Non tender  Mildly warm  Excoriations noted No skin breakdown  Neurological:     Mental Status: She is alert.     Cranial Nerves: No cranial nerve deficit.  Psychiatric:        Mood and Affect: Mood normal.           Assessment & Plan:   Problem List Items Addressed This Visit       Musculoskeletal and Integument   Trigger thumb    In left handed person  See a/p for thumb pain       Rheumatoid arthritis with positive rheumatoid factor (HCC)    Per pt - was not actually dx with RA/ was questionable from rheumatologist  Lab Results  Component Value Date   ANA NEG 06/13/2007   RF 29.3 (H) 06/13/2007         Insect bite of right forearm    This occurred last night Less likely sting (did not hurt at the time) Primarily itching last night  Erythema and bruising looks to be secondary to scratching  (she does take asa)  Feels better now  Does not appear to be infected  Encouraged her to monitor  Use cool compresses for swelling and  if itch returns Cortisone cream is helpful          Other   Pain of right thumb - Primary    Mostly in MTP area (per report some swelling)  Enlargement but no soft tissue swelling/redness or warmth  Some triggering with flexion noted today as well   Suspect arthritis (has OA and some ? Of RA in the past)- causing tendon to trigger  Pt is left handed    Suggest use of cool water or compress Voltaren gel 1% - see AVS  Avoid painful positions Xray today   Has appt with ortho Dr Hyacinth Meeker in 3 wk Sport med may also be option   Pending rad review for further plan       Relevant Orders   DG Hand Complete Right

## 2023-02-16 NOTE — Assessment & Plan Note (Signed)
In left handed person  See a/p for thumb pain

## 2023-02-16 NOTE — Patient Instructions (Signed)
I suspect you have arthritis in your thumb and hand  Also a trigger finger (thumb)   Use cold compress or cold water when you can Avoid the painful activities when you can  Get some voltaren gel 1% and use small amount ot painful area up to 4 times per day   Xray today We will contact you when reading returns (7-10 day possible)    Keep appt with orthopedics  If worse in the meantime let us know

## 2023-02-16 NOTE — Assessment & Plan Note (Signed)
Per pt - was not actually dx with RA/ was questionable from rheumatologist  Lab Results  Component Value Date   ANA NEG 06/13/2007   RF 29.3 (H) 06/13/2007

## 2023-02-16 NOTE — Assessment & Plan Note (Signed)
Mostly in MTP area (per report some swelling)  Enlargement but no soft tissue swelling/redness or warmth  Some triggering with flexion noted today as well   Suspect arthritis (has OA and some ? Of RA in the past)- causing tendon to trigger  Pt is left handed   Suggest use of cool water or compress Voltaren gel 1% - see AVS  Avoid painful positions Xray today   Has appt with ortho Dr Hyacinth Meeker in 3 wk Sport med may also be option   Pending rad review for further plan

## 2023-03-03 DIAGNOSIS — M1811 Unilateral primary osteoarthritis of first carpometacarpal joint, right hand: Secondary | ICD-10-CM | POA: Diagnosis not present

## 2023-03-31 DIAGNOSIS — R410 Disorientation, unspecified: Secondary | ICD-10-CM | POA: Diagnosis not present

## 2023-03-31 DIAGNOSIS — Z8673 Personal history of transient ischemic attack (TIA), and cerebral infarction without residual deficits: Secondary | ICD-10-CM | POA: Diagnosis not present

## 2023-03-31 DIAGNOSIS — E569 Vitamin deficiency, unspecified: Secondary | ICD-10-CM | POA: Diagnosis not present

## 2023-03-31 DIAGNOSIS — Z79899 Other long term (current) drug therapy: Secondary | ICD-10-CM | POA: Diagnosis not present

## 2023-03-31 DIAGNOSIS — R4789 Other speech disturbances: Secondary | ICD-10-CM | POA: Diagnosis not present

## 2023-03-31 DIAGNOSIS — R4189 Other symptoms and signs involving cognitive functions and awareness: Secondary | ICD-10-CM | POA: Diagnosis not present

## 2023-04-13 ENCOUNTER — Ambulatory Visit (INDEPENDENT_AMBULATORY_CARE_PROVIDER_SITE_OTHER): Payer: Medicare HMO | Admitting: Family Medicine

## 2023-04-13 ENCOUNTER — Encounter: Payer: Self-pay | Admitting: Family Medicine

## 2023-04-13 VITALS — BP 112/68 | HR 86 | Temp 97.6°F | Ht 62.5 in | Wt 192.5 lb

## 2023-04-13 DIAGNOSIS — R1032 Left lower quadrant pain: Secondary | ICD-10-CM

## 2023-04-13 DIAGNOSIS — R109 Unspecified abdominal pain: Secondary | ICD-10-CM

## 2023-04-13 DIAGNOSIS — K5730 Diverticulosis of large intestine without perforation or abscess without bleeding: Secondary | ICD-10-CM | POA: Diagnosis not present

## 2023-04-13 DIAGNOSIS — R10A2 Flank pain, left side: Secondary | ICD-10-CM

## 2023-04-13 LAB — POC URINALSYSI DIPSTICK (AUTOMATED)
Bilirubin, UA: NEGATIVE
Blood, UA: NEGATIVE
Glucose, UA: NEGATIVE
Ketones, UA: NEGATIVE
Leukocytes, UA: NEGATIVE
Nitrite, UA: NEGATIVE
Protein, UA: NEGATIVE
Spec Grav, UA: >=1.03 — AB (ref 1.010–1.025)
Urobilinogen, UA: 0.2 U/dL
pH, UA: 5.5 (ref 5.0–8.0)

## 2023-04-13 NOTE — Assessment & Plan Note (Signed)
This may be cause of intermittent LLQ abd pain  Resolved today  No fever or signs of infection  Lab pending  Exam is reassuring   Reviewed effort to avoid nuts/seeds/corn Encouraged use of fiber supplement  Also increase fluids

## 2023-04-13 NOTE — Assessment & Plan Note (Signed)
This has been intermittent Most recent bout is resolved  Urinalysis clear Labs pending Normal exam  May be diverticulosis related  Discussed signs and symptoms of infectious diverticulitis to watch for  Reviewed last colonoscopy  Call back and Er precautions noted in detail today

## 2023-04-13 NOTE — Progress Notes (Signed)
Subjective:    Patient ID: Cheryl Joseph, female    DOB: [birth year], 82 y.o.   MRN: [id number]  HPI  Wt Readings from Last 3 Encounters:  04/13/23 192 lb 8 oz (87.3 kg)  02/16/23 200 lb 4 oz (90.8 kg)  12/28/22 199 lb (90.3 kg)   34.65 kg/m  Vitals:   04/13/23 1553  BP: 112/68  Pulse: 86  Temp: 97.6 F (36.4 C)  SpO2: 96%     Pt presents with urinary issues   Left lower abdomen hurts Suprapubic area  Left lower back   Episodic  In past months in between  This time last episode was 2 wk ago   Drinking more water -has helped  Does not think she gets enough fluids   Pain is entirely better now   No nuts/ seeds/popcorn     She has history of overactive bladder with urge incontinence in the past  Has taken vesicare and oxytrol in the past (still oxytrol)    Urinalysis is entirely clear   Last colonoscopy 2016- diverticulosis  Pt thinks this pain is a bit different   Working on weight loss  Eating better and less   Results for orders placed or performed in visit on 04/13/23  POCT Urinalysis Dipstick (Automated)  Result Value Ref Range   Color, UA Yellow    Clarity, UA Clear    Glucose, UA Negative Negative   Bilirubin, UA Negative    Ketones, UA Negative    Spec Grav, UA >=1.030 (A) 1.010 - 1.025   Blood, UA Negative    pH, UA 5.5 5.0 - 8.0   Protein, UA Negative Negative   Urobilinogen, UA 0.2 0.2 or 1.0 E.U./dL   Nitrite, UA Negative    Leukocytes, UA Negative Negative       Patient Active Problem List   Diagnosis Date Noted   Pain of right thumb 02/16/2023   Trigger thumb 02/16/2023   Encounter for screening mammogram for breast cancer 12/28/2022   Cerumen impaction 12/28/2022   Cough 12/13/2022   Contact dermatitis 09/28/2022   Chronic headache 02/25/2022   Snoring 02/25/2022   History of stroke 12/31/2021   Umbilical hernia 01/05/2021   Pre-operative general physical examination 07/06/2019   Estrogen deficiency 05/25/2016    Routine general medical examination at a health care facility 11/23/2015   Hammertoe 10/28/2015   Diverticulosis of colon without hemorrhage 02/14/2015   Colon cancer screening 08/19/2014   LLQ pain 03/18/2014   Essential hypertension 02/25/2014   Encounter for Medicare annual wellness exam 07/16/2013   Prediabetes 05/14/2009   Rheumatoid arthritis with positive rheumatoid factor (HCC) 05/13/2008   Osteopenia 03/07/2007   Hyperlipidemia 01/03/2007   Obesity (BMI 30-39.9) 01/03/2007   ALLERGIC RHINITIS, SEASONAL 01/03/2007   ASTHMA 01/03/2007   OVERACTIVE BLADDER 01/03/2007   Past Medical History:  Diagnosis Date   Anginal pain (HCC)    Arthritis    RA hands(seronegative)   Asthma    Back pain    Cancer (HCC)    skin   Cataract    HPV in female 1996   HPV with colposcopy (all neg paps since)   Hyperlipidemia    Hypertension    Incarcerated ventral hernia    Obesity    Osteopenia    Pre-diabetes    Shortness of breath dyspnea    Shoulder pain    Past Surgical History:  Procedure Laterality Date   CATARACT EXTRACTION, BILATERAL Bilateral    COLONOSCOPY  06/01/2004  COLONOSCOPY WITH PROPOFOL N/A 05/01/2015   Procedure: COLONOSCOPY WITH PROPOFOL;  Surgeon: Scot Jun, MD;  Location: [facility] ENDOSCOPY;  Service: Endoscopy;  Laterality: N/A;   CORRECTION HAMMER TOE Right    2nd toe   JOINT REPLACEMENT Right    right knee replacement January 2021   SQUAMOUS CELL CARCINOMA EXCISION Left 12/2017   left arm   TONSILLECTOMY     VAGINAL HYSTERECTOMY  2015   with pelvic organ prolapse surgery    XI ROBOTIC ASSISTED VENTRAL HERNIA N/A 06/10/2021   Procedure: XI ROBOTIC ASSISTED VENTRAL HERNIA;  Surgeon: Carolan Shiver, MD;  Location: [facility] ORS;  Service: General;  Laterality: N/A;   Social History   Tobacco Use   Smoking status: Never   Smokeless tobacco: Never  Vaping Use   Vaping status: Never Used  Substance Use Topics   Alcohol use: No    Alcohol/week:  0.0 standard drinks of alcohol   Drug use: No   Family History  Problem Relation Age of Onset   Diabetes Mother    Hyperlipidemia Mother    Heart disease Mother    Hypertension Mother    Obesity Mother    Kyphosis Mother    Heart disease Father    Hypertension Father    Cancer Father        lung Cancer smoker   Diabetes Daughter    Kyphosis Sister    Breast cancer Neg Hx    Allergies  Allergen Reactions   Atorvastatin     Headache    Tolterodine Tartrate Other (See Comments)    Unknown (back ache)   Vesicare [Solifenacin Succinate]     Eye problems    Zetia [Ezetimibe]     Headache    Current Outpatient Medications on File Prior to Visit  Medication Sig Dispense Refill   acetaminophen (TYLENOL) 500 MG tablet Take 1,000 mg by mouth every 6 (six) hours as needed for mild pain.     albuterol (VENTOLIN HFA) 108 (90 Base) MCG/ACT inhaler INHALE TWO PUFFS BY MOUTH EVERY 4 HOURS AS NEEDED FOR WHEEZING AND 2 PUFFS BEFORE EXERCISE OR EXPOSURE TO COLD AIR 18 each 0   amLODipine (NORVASC) 5 MG tablet Take 1 tablet (5 mg total) by mouth daily. 90 tablet 3   aspirin EC 81 MG tablet Take 1 tablet (81 mg total) by mouth daily. Swallow whole.     b complex vitamins tablet Take 1 tablet by mouth every Monday, Wednesday, and Friday.     Cholecalciferol (VITAMIN D3 PO) Take 2 tablets by mouth daily.     Hyaluronic Acid-Vitamin C (HYALURONIC ACID PO) Take 1 tablet by mouth every Monday, Wednesday, and Friday.     montelukast (SINGULAIR) 10 MG tablet Take 1 tablet (10 mg total) by mouth at bedtime. 90 tablet 3   Multiple Vitamin (MULTIVITAMIN) capsule Take 1 capsule by mouth daily. Reported on 12/18/2015     Omega-3 Fatty Acids (FISH OIL PO) Take 1 capsule by mouth daily. Reported on 12/18/2015     OVER THE COUNTER MEDICATION Take 2 capsules by mouth daily. BONE UP     oxybutynin (OXYTROL) 3.9 MG/24HR Place 1 patch onto the skin once a week. Sunday.  Home med.     Probiotic Product (PROBIOTIC  DAILY PO) Take 1 tablet by mouth daily.     Pumpkin Seed-Soy Germ (AZO BLADDER CONTROL/GO-LESS PO) Take 1 capsule by mouth at bedtime.     rosuvastatin (CRESTOR) 20 MG tablet Take 1 tablet (20  mg total) by mouth every other day. Takes 3 days per week 45 tablet 3   simvastatin (ZOCOR) 40 MG tablet Take 1 tablet (40 mg total) by mouth every other day. 45 tablet 3   TURMERIC PO Take 720 mg by mouth every Monday, Wednesday, and Friday.     Ubiquinol 100 MG CAPS Take 1 capsule by mouth every Monday, Wednesday, and Friday.     No current facility-administered medications on file prior to visit.    Review of Systems  Constitutional:  Negative for activity change, appetite change, chills, fatigue, fever and unexpected weight change.  HENT:  Negative for congestion, ear pain, rhinorrhea, sinus pressure and sore throat.   Eyes:  Negative for pain, redness and visual disturbance.  Respiratory:  Negative for cough, shortness of breath and wheezing.   Cardiovascular:  Negative for chest pain and palpitations.  Gastrointestinal:  Negative for abdominal pain, blood in stool, constipation and diarrhea.       Abdominal pain is now resolved   Endocrine: Negative for polydipsia and polyuria.  Genitourinary:  Negative for dysuria, frequency and urgency.  Musculoskeletal:  Positive for back pain. Negative for arthralgias and myalgias.       Back pain is improved   Skin:  Negative for pallor and rash.  Allergic/Immunologic: Negative for environmental allergies.  Neurological:  Negative for dizziness, syncope and headaches.  Hematological:  Negative for adenopathy. Does not bruise/bleed easily.  Psychiatric/Behavioral:  Negative for decreased concentration and dysphoric mood. The patient is not nervous/anxious.        Objective:   Physical Exam Constitutional:      General: She is not in acute distress.    Appearance: Normal appearance. She is well-developed. She is obese. She is not ill-appearing or  diaphoretic.  HENT:     Head: Normocephalic and atraumatic.  Eyes:     General: No scleral icterus.    Conjunctiva/sclera: Conjunctivae normal.     Pupils: Pupils are equal, round, and reactive to light.  Cardiovascular:     Rate and Rhythm: Normal rate and regular rhythm.     Heart sounds: Normal heart sounds.  Pulmonary:     Effort: Pulmonary effort is normal. No respiratory distress.     Breath sounds: Normal breath sounds. No wheezing or rales.  Abdominal:     General: Abdomen is protuberant. Bowel sounds are normal. There is no distension.     Palpations: Abdomen is soft. There is no hepatomegaly, splenomegaly, mass or pulsatile mass.     Tenderness: There is no abdominal tenderness. There is no right CVA tenderness, left CVA tenderness, guarding or rebound. Negative signs include Murphy's sign and McBurney's sign.     Hernia: No hernia is present.     Comments: No suprapubic tenderness or fullness     Musculoskeletal:     Cervical back: Normal range of motion and neck supple.  Lymphadenopathy:     Cervical: No cervical adenopathy.  Skin:    General: Skin is warm and dry.     Coloration: Skin is not jaundiced or pale.     Findings: No bruising, erythema or rash.  Neurological:     Mental Status: She is alert.     Motor: No weakness.  Psychiatric:        Mood and Affect: Mood normal.           Assessment & Plan:   Problem List Items Addressed This Visit       Other  Diverticulosis of colon without hemorrhage    This may be cause of intermittent LLQ abd pain  Resolved today  No fever or signs of infection  Lab pending  Exam is reassuring   Reviewed effort to avoid nuts/seeds/corn Encouraged use of fiber supplement  Also increase fluids       LLQ pain    This has been intermittent Most recent bout is resolved  Urinalysis clear Labs pending Normal exam  May be diverticulosis related  Discussed signs and symptoms of infectious diverticulitis to watch  for  Reviewed last colonoscopy  Call back and Er precautions noted in detail today        Relevant Orders   Basic metabolic panel   CBC with Differential/Platelet   Hepatic function panel   Other Visit Diagnoses     Left flank pain    -  Primary   Relevant Orders   POCT Urinalysis Dipstick (Automated) (Completed)

## 2023-04-13 NOTE — Patient Instructions (Addendum)
Get at least 60 oz of fluid daily -mostly water   Labs today  I do wonder if you have some pain related to diverticulosis   Labs today   Continue to avoid nuts and seeds   Stick to clear fluids today and tomorrow   Then gradually advance diet  Add a fiber supplement (mix with water) daily - like metamucil or citrucel or benefiber   Watch for increased pain / fever/ nausea/stool change/ blood in stool  If severe go to the ER   Take care of yourself

## 2023-04-14 LAB — BASIC METABOLIC PANEL
BUN: 37 mg/dL — ABNORMAL HIGH (ref 6–23)
CO2: 28 mEq/L (ref 19–32)
Calcium: 9.4 mg/dL (ref 8.4–10.5)
Chloride: 106 mEq/L (ref 96–112)
Creatinine, Ser: 0.95 mg/dL (ref 0.40–1.20)
GFR: 55.51 mL/min — ABNORMAL LOW (ref >60.00)
Glucose, Bld: 94 mg/dL (ref 70–99)
Potassium: 4.4 mEq/L (ref 3.5–5.1)
Sodium: 143 mEq/L (ref 135–145)

## 2023-04-14 LAB — HEPATIC FUNCTION PANEL
ALT: 13 U/L (ref 0–35)
AST: 16 U/L (ref 0–37)
Albumin: 3.9 g/dL (ref 3.5–5.2)
Alkaline Phosphatase: 79 U/L (ref 39–117)
Bilirubin, Direct: 0.1 mg/dL (ref 0.0–0.3)
Total Bilirubin: 0.5 mg/dL (ref 0.2–1.2)
Total Protein: 6.4 g/dL (ref 6.0–8.3)

## 2023-04-14 LAB — CBC WITH DIFFERENTIAL/PLATELET
Basophils Absolute: 0 10*3/uL (ref 0.0–0.1)
Basophils Relative: 0.4 % (ref 0.0–3.0)
Eosinophils Absolute: 0.2 10*3/uL (ref 0.0–0.7)
Eosinophils Relative: 2.5 % (ref 0.0–5.0)
HCT: 45.3 % (ref 36.0–46.0)
Hemoglobin: 14.8 g/dL (ref 12.0–15.0)
Lymphocytes Relative: 29 % (ref 12.0–46.0)
Lymphs Abs: 2.3 10*3/uL (ref 0.7–4.0)
MCHC: 32.6 g/dL (ref 30.0–36.0)
MCV: 96 fl (ref 78.0–100.0)
Monocytes Absolute: 0.7 10*3/uL (ref 0.1–1.0)
Monocytes Relative: 8.5 % (ref 3.0–12.0)
Neutro Abs: 4.8 10*3/uL (ref 1.4–7.7)
Neutrophils Relative %: 59.6 % (ref 43.0–77.0)
Platelets: 219 10*3/uL (ref 150.0–400.0)
RBC: 4.72 Mil/uL (ref 3.87–5.11)
RDW: 13.8 % (ref 11.5–15.5)
WBC: 8.1 10*3/uL (ref 4.0–10.5)

## 2023-05-09 ENCOUNTER — Ambulatory Visit (INDEPENDENT_AMBULATORY_CARE_PROVIDER_SITE_OTHER): Payer: Medicare HMO

## 2023-05-09 DIAGNOSIS — Z23 Encounter for immunization: Secondary | ICD-10-CM

## 2023-05-20 DIAGNOSIS — D2272 Melanocytic nevi of left lower limb, including hip: Secondary | ICD-10-CM | POA: Diagnosis not present

## 2023-05-20 DIAGNOSIS — D2261 Melanocytic nevi of right upper limb, including shoulder: Secondary | ICD-10-CM | POA: Diagnosis not present

## 2023-05-20 DIAGNOSIS — Z85828 Personal history of other malignant neoplasm of skin: Secondary | ICD-10-CM | POA: Diagnosis not present

## 2023-05-20 DIAGNOSIS — D2271 Melanocytic nevi of right lower limb, including hip: Secondary | ICD-10-CM | POA: Diagnosis not present

## 2023-05-20 DIAGNOSIS — D2262 Melanocytic nevi of left upper limb, including shoulder: Secondary | ICD-10-CM | POA: Diagnosis not present

## 2023-05-20 DIAGNOSIS — D225 Melanocytic nevi of trunk: Secondary | ICD-10-CM | POA: Diagnosis not present

## 2023-07-08 IMAGING — MG MM DIGITAL SCREENING BILAT W/ TOMO AND CAD
8 series · 8 of 24 positions shown · non-contrast
Comparison: Previous exam(s).

CLINICAL DATA: Screening.

EXAM:
DIGITAL SCREENING BILATERAL MAMMOGRAM WITH TOMOSYNTHESIS AND CAD
TECHNIQUE: Bilateral screening digital craniocaudal and mediolateral oblique
mammograms were obtained. Bilateral screening digital breast
tomosynthesis was performed. The images were evaluated with
computer-aided detection.

[L MLO synth-2D]
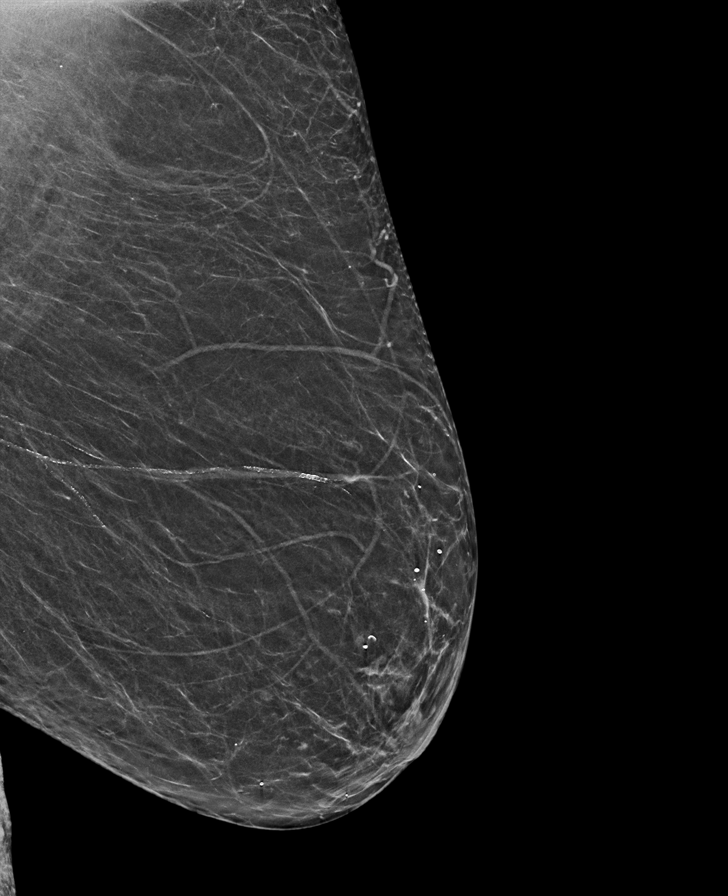

[R CC synth-2D]
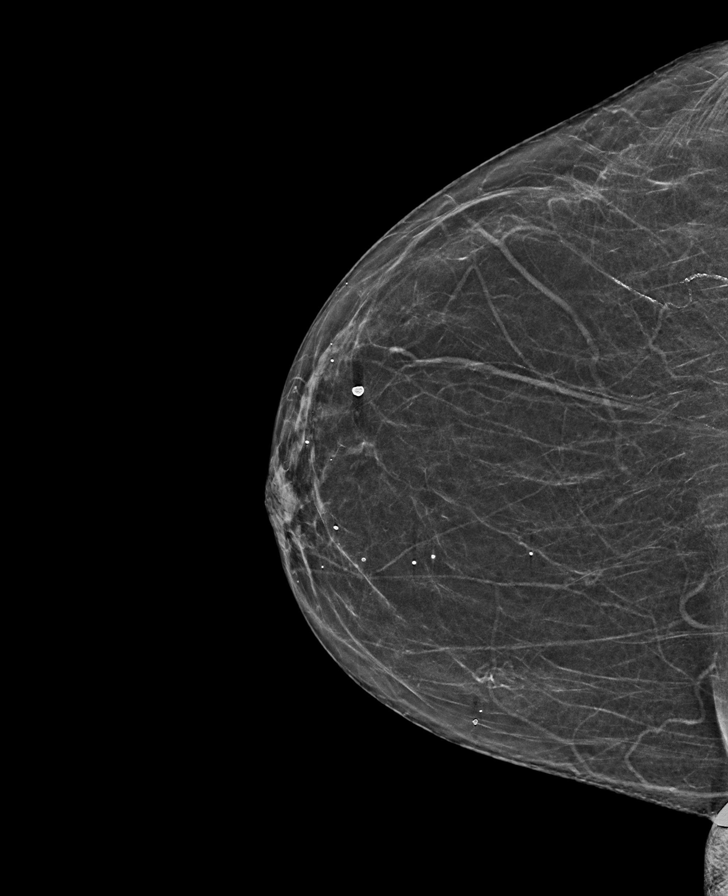

[R MLO synth-2D]
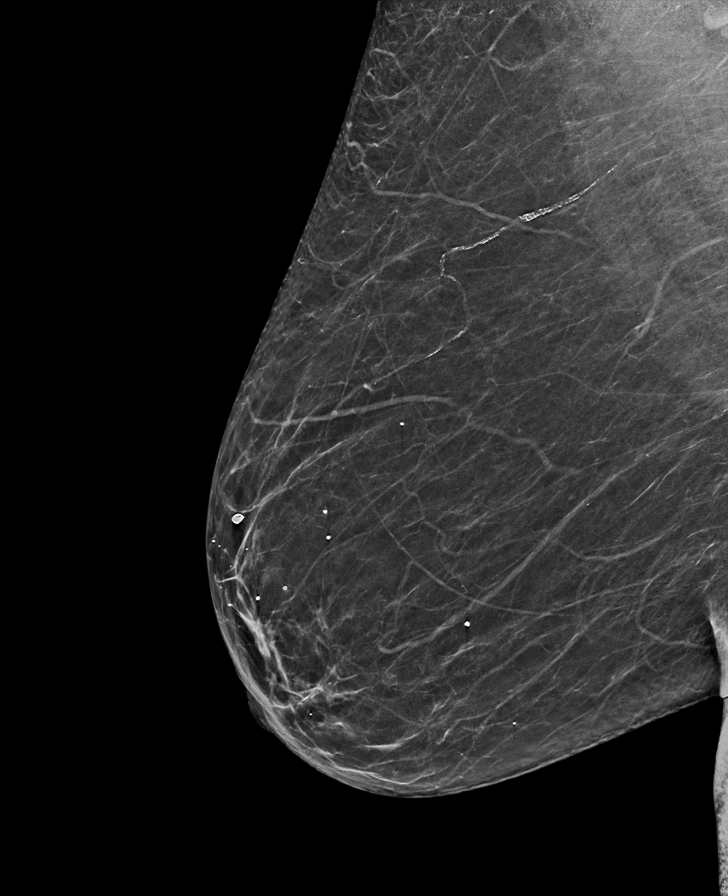

[L CC synth-2D]
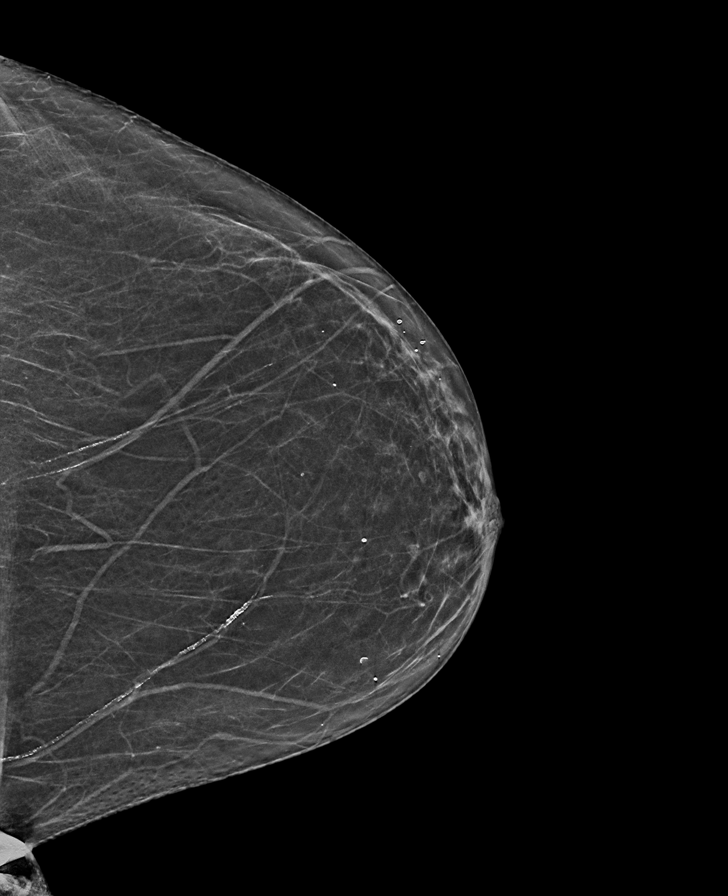

[L MLO tomo · tomo slice 31/62.0]
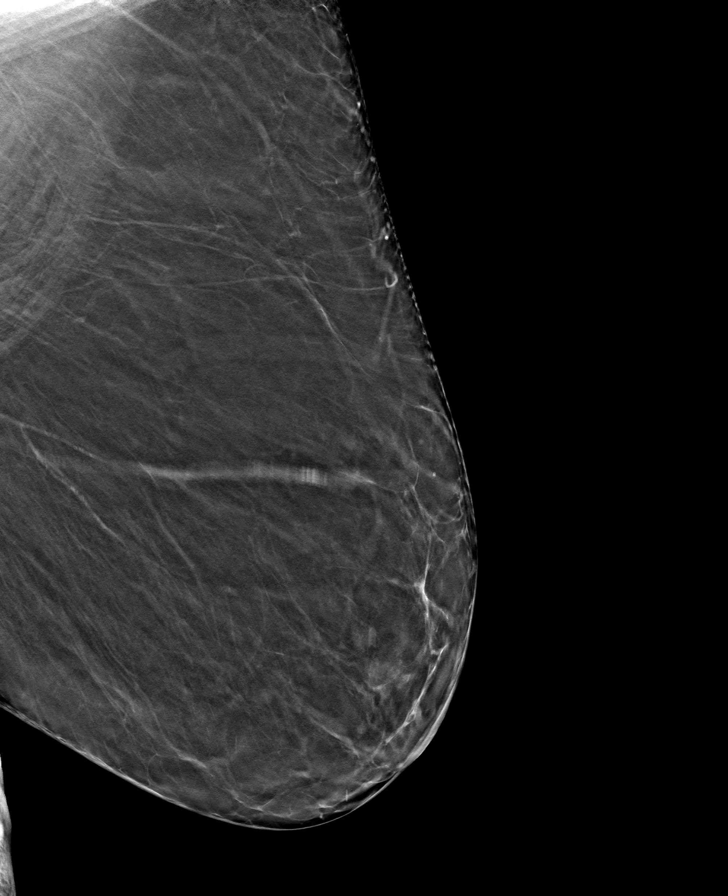

[R CC tomo · tomo slice 29/58.0]
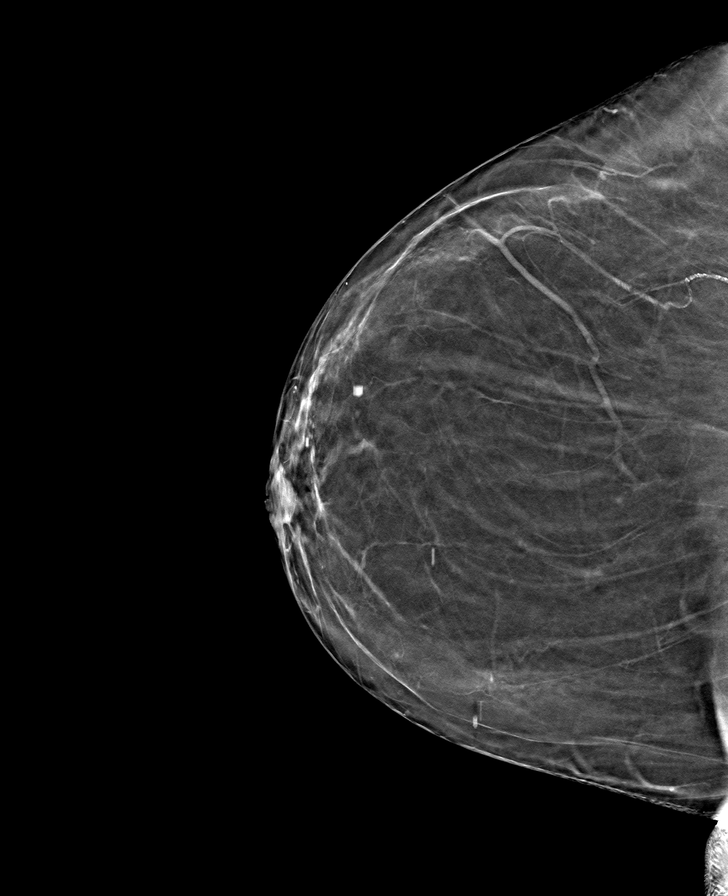

[R MLO tomo · tomo slice 33/64.0]
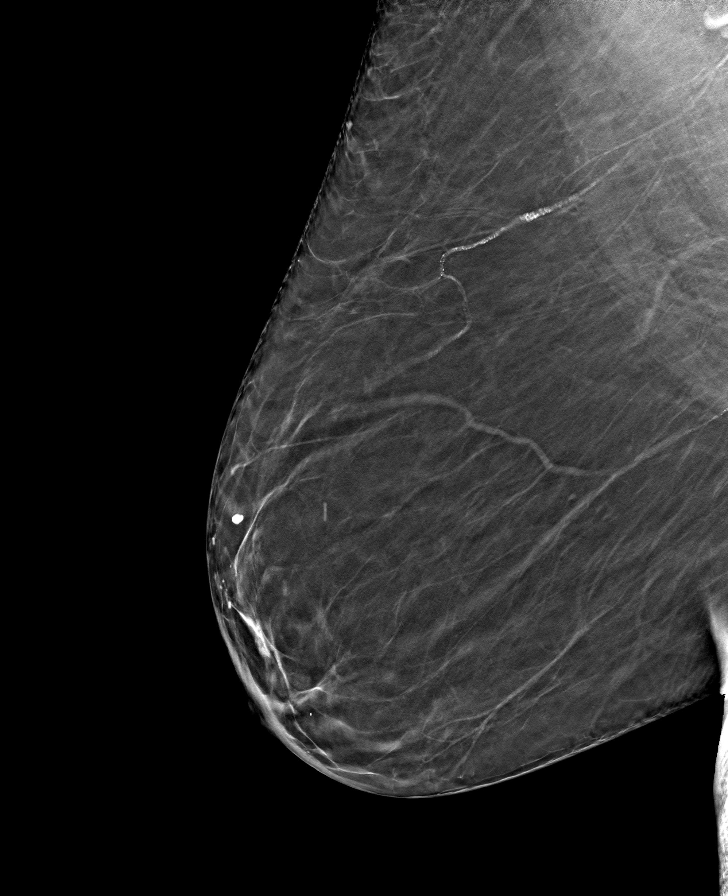

[L CC tomo · tomo slice 29/57.0]
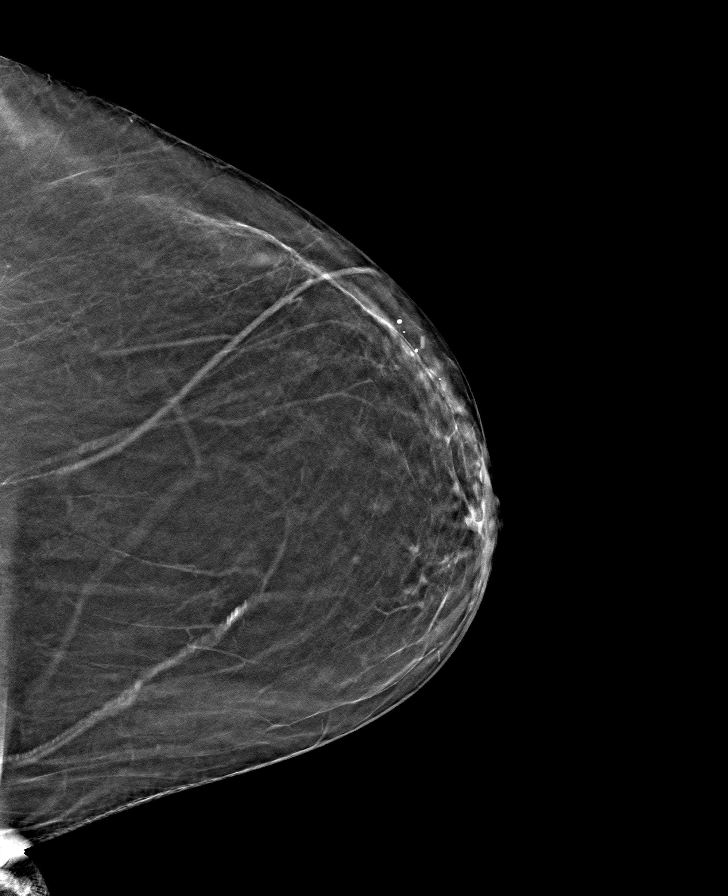

[8 of 24 positions shown; findings below may reference images not displayed]

ACR Breast Density Category b: There are scattered areas of
fibroglandular density.
FINDINGS: There are no findings suspicious for malignancy.
IMPRESSION: No mammographic evidence of malignancy. A result letter of this
screening mammogram will be mailed directly to the patient.

RECOMMENDATION:
Screening mammogram in one year. (Code:51-O-LD2)

BI-RADS CATEGORY  1: Negative.

## 2023-07-22 ENCOUNTER — Encounter: Payer: Self-pay | Admitting: Family Medicine

## 2023-07-22 ENCOUNTER — Ambulatory Visit (INDEPENDENT_AMBULATORY_CARE_PROVIDER_SITE_OTHER): Payer: Medicare HMO | Admitting: Family Medicine

## 2023-07-22 VITALS — BP 135/70 | HR 62 | Temp 98.2°F | Ht 62.5 in | Wt 204.1 lb

## 2023-07-22 DIAGNOSIS — N318 Other neuromuscular dysfunction of bladder: Secondary | ICD-10-CM

## 2023-07-22 DIAGNOSIS — I10 Essential (primary) hypertension: Secondary | ICD-10-CM | POA: Diagnosis not present

## 2023-07-22 DIAGNOSIS — N819 Female genital prolapse, unspecified: Secondary | ICD-10-CM | POA: Diagnosis not present

## 2023-07-22 NOTE — Patient Instructions (Signed)
Your exam is not worrisome  If you develop any new urinary or bowel symptoms let us know   I think you have both a cystocele and a rectocele   Take care of yourself

## 2023-07-22 NOTE — Progress Notes (Signed)
Subjective:    Patient ID: Cheryl Joseph, female    DOB: 1940/11/12, 82 y.o.   MRN: [id number]  HPI  Wt Readings from Last 3 Encounters:  07/22/23 204 lb 2 oz (92.6 kg)  04/13/23 192 lb 8 oz (87.3 kg)  02/16/23 200 lb 4 oz (90.8 kg)   36.74 kg/m  Vitals:   07/22/23 1104 07/22/23 1128  BP: (!) 142/62 135/70  Pulse: 62   Temp: 98.2 F (36.8 C)   SpO2: 96%    Pt presents with c/o vaginal wall prolapse   Noted when she wiped /felt a hard area (just once)  Husband concerned it may be a hernia   She would not have some it it was not for him   No constipation No straining No pain or itching or burning   No discomfort with intercourse  Does not have a lot of vaginal dryness    History of vaginal hysterectomy in the past 2015  (partia)  Colpopexy /suspension done (mesh) It never worked  She states she lives with this   Frequency and incontinence is not any difference  Is a good fluid drinker   Uses oxytrol patch weekly  Uses grapeseed oil   Last pap normal 2010  No low abd pain  No pelvic pain  Family history  Mother had bladder prolapse and had surgery and then a pessary  Had to catheterize her     HTN bp is stable today  No cp or palpitations or headaches or edema  No side effects to medicines  BP Readings from Last 3 Encounters:  07/22/23 135/70  04/13/23 112/68  02/16/23 128/74    Amlodipine 5 mg daily  Patient Active Problem List   Diagnosis Date Noted   Pelvic prolapse 07/22/2023   Pain of right thumb 02/16/2023   Trigger thumb 02/16/2023   Encounter for screening mammogram for breast cancer 12/28/2022   Cerumen impaction 12/28/2022   Cough 12/13/2022   Contact dermatitis 09/28/2022   Chronic headache 02/25/2022   Snoring 02/25/2022   History of stroke 12/31/2021   Umbilical hernia 01/05/2021   Pre-operative general physical examination 07/06/2019   Estrogen deficiency 05/25/2016   Routine general medical examination at a health  care facility 11/23/2015   Hammertoe 10/28/2015   Diverticulosis of colon without hemorrhage 02/14/2015   Colon cancer screening 08/19/2014   LLQ pain 03/18/2014   Essential hypertension 02/25/2014   Encounter for Medicare annual wellness exam 07/16/2013   Prediabetes 05/14/2009   Rheumatoid arthritis with positive rheumatoid factor (HCC) 05/13/2008   Osteopenia 03/07/2007   Hyperlipidemia 01/03/2007   Obesity (BMI 30-39.9) 01/03/2007   ALLERGIC RHINITIS, SEASONAL 01/03/2007   Asthma 01/03/2007   OVERACTIVE BLADDER 01/03/2007   Past Medical History:  Diagnosis Date   Anginal pain (HCC)    Arthritis    RA hands(seronegative)   Asthma    Back pain    Cancer (HCC)    skin   Cataract    HPV in female 1996   HPV with colposcopy (all neg paps since)   Hyperlipidemia    Hypertension    Incarcerated ventral hernia    Obesity    Osteopenia    Pre-diabetes    Shortness of breath dyspnea    Shoulder pain    Past Surgical History:  Procedure Laterality Date   CATARACT EXTRACTION, BILATERAL Bilateral    COLONOSCOPY  06/01/2004   COLONOSCOPY WITH PROPOFOL N/A 05/01/2015   Procedure: COLONOSCOPY WITH PROPOFOL;  Surgeon: Molly Maduro  Daron Offer, MD;  Location: [facility] ENDOSCOPY;  Service: Endoscopy;  Laterality: N/A;   CORRECTION HAMMER TOE Right    2nd toe   JOINT REPLACEMENT Right    right knee replacement January 2021   SQUAMOUS CELL CARCINOMA EXCISION Left 12/2017   left arm   TONSILLECTOMY     VAGINAL HYSTERECTOMY  2015   with pelvic organ prolapse surgery    XI ROBOTIC ASSISTED VENTRAL HERNIA N/A 06/10/2021   Procedure: XI ROBOTIC ASSISTED VENTRAL HERNIA;  Surgeon: Carolan Shiver, MD;  Location: [facility] ORS;  Service: General;  Laterality: N/A;   Social History   Tobacco Use   Smoking status: Never   Smokeless tobacco: Never  Vaping Use   Vaping status: Never Used  Substance Use Topics   Alcohol use: No    Alcohol/week: 0.0 standard drinks of alcohol   Drug use: No    Family History  Problem Relation Age of Onset   Diabetes Mother    Hyperlipidemia Mother    Heart disease Mother    Hypertension Mother    Obesity Mother    Kyphosis Mother    Heart disease Father    Hypertension Father    Cancer Father        lung Cancer smoker   Diabetes Daughter    Kyphosis Sister    Breast cancer Neg Hx    Allergies  Allergen Reactions   Atorvastatin     Headache    Tolterodine Tartrate Other (See Comments)    Unknown (back ache)   Vesicare [Solifenacin Succinate]     Eye problems    Zetia [Ezetimibe]     Headache    Current Outpatient Medications on File Prior to Visit  Medication Sig Dispense Refill   acetaminophen (TYLENOL) 500 MG tablet Take 1,000 mg by mouth every 6 (six) hours as needed for mild pain.     albuterol (VENTOLIN HFA) 108 (90 Base) MCG/ACT inhaler INHALE TWO PUFFS BY MOUTH EVERY 4 HOURS AS NEEDED FOR WHEEZING AND 2 PUFFS BEFORE EXERCISE OR EXPOSURE TO COLD AIR 18 each 0   amLODipine (NORVASC) 5 MG tablet Take 1 tablet (5 mg total) by mouth daily. 90 tablet 3   aspirin EC 81 MG tablet Take 1 tablet (81 mg total) by mouth daily. Swallow whole.     b complex vitamins tablet Take 1 tablet by mouth every Monday, Wednesday, and Friday.     Cholecalciferol (VITAMIN D3 PO) Take 2 tablets by mouth daily.     Hyaluronic Acid-Vitamin C (HYALURONIC ACID PO) Take 1 tablet by mouth every Monday, Wednesday, and Friday.     montelukast (SINGULAIR) 10 MG tablet Take 1 tablet (10 mg total) by mouth at bedtime. 90 tablet 3   Multiple Vitamin (MULTIVITAMIN) capsule Take 1 capsule by mouth daily. Reported on 12/18/2015     Omega-3 Fatty Acids (FISH OIL PO) Take 1 capsule by mouth daily. Reported on 12/18/2015     OVER THE COUNTER MEDICATION Take 2 capsules by mouth daily. BONE UP     oxybutynin (OXYTROL) 3.9 MG/24HR Place 1 patch onto the skin once a week. Sunday.  Home med.     Probiotic Product (PROBIOTIC DAILY PO) Take 1 tablet by mouth daily.      Pumpkin Seed-Soy Germ (AZO BLADDER CONTROL/GO-LESS PO) Take 1 capsule by mouth at bedtime.     rosuvastatin (CRESTOR) 20 MG tablet Take 1 tablet (20 mg total) by mouth every other day. Takes 3 days per week 45 tablet  3   simvastatin (ZOCOR) 40 MG tablet Take 1 tablet (40 mg total) by mouth every other day. 45 tablet 3   TURMERIC PO Take 720 mg by mouth every Monday, Wednesday, and Friday.     Ubiquinol 100 MG CAPS Take 1 capsule by mouth every Monday, Wednesday, and Friday.     No current facility-administered medications on file prior to visit.    Review of Systems  Constitutional:  Negative for activity change, appetite change, fatigue, fever and unexpected weight change.  HENT:  Negative for congestion, ear pain, rhinorrhea, sinus pressure and sore throat.   Eyes:  Negative for pain, redness and visual disturbance.  Respiratory:  Negative for cough, shortness of breath and wheezing.   Cardiovascular:  Negative for chest pain and palpitations.  Gastrointestinal:  Negative for abdominal pain, blood in stool, constipation and diarrhea.  Endocrine: Negative for polydipsia and polyuria.  Genitourinary:  Positive for frequency. Negative for difficulty urinating, dysuria and urgency.       Some frequency and incontinence of urination/ baseline  Musculoskeletal:  Negative for arthralgias, back pain and myalgias.  Skin:  Negative for pallor and rash.  Allergic/Immunologic: Negative for environmental allergies.  Neurological:  Negative for dizziness, syncope and headaches.  Hematological:  Negative for adenopathy. Does not bruise/bleed easily.  Psychiatric/Behavioral:  Negative for decreased concentration and dysphoric mood. The patient is not nervous/anxious.        Objective:   Physical Exam Exam conducted with a chaperone present.  Genitourinary:    Exam position: Supine.     Pubic Area: No rash.      Labia:        Right: No rash, tenderness or lesion.        Left: No rash, tenderness  or lesion.      Urethra: No urethral lesion.     Vagina: Prolapsed vaginal walls present.     Uterus: Absent.      Comments: Pelvic/ vaginal wall prolapse noted from above and below  No tenderness No mass or firm areas   No M on bimanual /unable to palp ovaries No tenderness Vag mucosa is normal           Assessment & Plan:   Problem List Items Addressed This Visit       Cardiovascular and Mediastinum   Essential hypertension   bp in fair control at this time  BP Readings from Last 1 Encounters:  07/22/23 135/70   No changes needed Most recent labs reviewed  Disc lifstyle change with low sodium diet and exercise  Controlled by amlodipine 5 mg daily in setting of past CVA        Genitourinary   Pelvic prolapse - Primary   In pt with history of partial hysterectomy and bladder suspension   Cystocele and rectocele both noted on exam  She felt firm area wiping-more than likely for stool in rectal vault  Reassuring bimanual exam/no masses noted  Symptoms are not bothersome and she continues to be sexually active   Handouts given re: pelvic prolapse and rectocele Pt will update [place] if new or worsening symptoms  Feels she can live with it        OVERACTIVE BLADDER   With cystocele Continues oxytrol patch which is helpful

## 2023-07-22 NOTE — Assessment & Plan Note (Signed)
With cystocele Continues oxytrol patch which is helpful

## 2023-07-22 NOTE — Assessment & Plan Note (Signed)
In pt with history of partial hysterectomy and bladder suspension   Cystocele and rectocele both noted on exam  She felt firm area wiping-more than likely for stool in rectal vault  Reassuring bimanual exam/no masses noted  Symptoms are not bothersome and she continues to be sexually active   Handouts given re: pelvic prolapse and rectocele Pt will update [place] if new or worsening symptoms  Feels she can live with it

## 2023-07-22 NOTE — Assessment & Plan Note (Signed)
bp in fair control at this time  BP Readings from Last 1 Encounters:  07/22/23 135/70   No changes needed Most recent labs reviewed  Disc lifstyle change with low sodium diet and exercise  Controlled by amlodipine 5 mg daily in setting of past CVA

## 2023-08-01 DIAGNOSIS — Z8673 Personal history of transient ischemic attack (TIA), and cerebral infarction without residual deficits: Secondary | ICD-10-CM | POA: Diagnosis not present

## 2023-08-01 DIAGNOSIS — R4189 Other symptoms and signs involving cognitive functions and awareness: Secondary | ICD-10-CM | POA: Diagnosis not present

## 2023-08-01 DIAGNOSIS — R519 Headache, unspecified: Secondary | ICD-10-CM | POA: Diagnosis not present

## 2023-08-25 ENCOUNTER — Ambulatory Visit: Payer: Self-pay | Admitting: Family Medicine

## 2023-08-25 NOTE — Telephone Encounter (Signed)
Copied from CRM [id number]. Topic: Clinical - Pink Word Triage >> Aug 25, 2023  3:06 PM Tiffany H wrote: Patient called to advise that she's been unwell since Saturday. Saturday, she woke up with a headache and pain in the very center of her chest. She experienced a mild recovery on Monday. Symptoms resumed on Tuesday. Patient advised that she is experiencing disruptive pain in her head and teeth that feels like a sinus infection and he right eye is watering.   Set expectations for Dr. Royden Purl availability should she want to come in for consult. Patient would like to discuss her symptoms with a nurse.   Chief Complaint: Cough Symptoms: Cough, fever, headache, runny nose Frequency: Constant Pertinent Negatives: Patient denies shortness of breath  Disposition: [] ED /[] Urgent Care (no appt availability in office) / [x] Appointment(In office/virtual)/ []  [place] [facility]/ [] Home Care/ [] Refused Recommended Disposition /[] [place] [facility]/ []  Follow-up with PCP Additional Notes: Patient reports that 5 days ago she developed a cough, headache, and runny nose. She states that her symptoms began to improve but are now worsening. She states that today she also had a fever of 100.6 F. She denies any shortness of breath. Appointment made for the patient tomorrow and patient instructed to call back for any new or worsening symptoms. Patient verbalized understanding and agreement with this plan.      Reason for Disposition  [1] Patient also has allergy symptoms (e.g., itchy eyes, clear nasal discharge, postnasal drip) AND [2] they are acting up  Answer Assessment - Initial Assessment Questions 1. ONSET: "When did the cough begin?"      5 days ago 2. SEVERITY: "How bad is the cough today?"      Dry cough, frequent 3. SPUTUM: "Describe the color of your sputum" (none, dry cough; clear, white, yellow, green)     No sputum 4. HEMOPTYSIS: "Are you coughing up any blood?" If so ask: "How much?"  (flecks, streaks, tablespoons, etc.)     No 5. DIFFICULTY BREATHING: "Are you having difficulty breathing?" If Yes, ask: "How bad is it?" (e.g., mild, moderate, severe)    - MILD: No SOB at rest, mild SOB with walking, speaks normally in sentences, can lie down, no retractions, pulse < 100.    - MODERATE: SOB at rest, SOB with minimal exertion and prefers to sit, cannot lie down flat, speaks in phrases, mild retractions, audible wheezing, pulse 100-120.    - SEVERE: Very SOB at rest, speaks in single words, struggling to breathe, sitting hunched forward, retractions, pulse > 120      No 6. FEVER: "Do you have a fever?" If Yes, ask: "What is your temperature, how was it measured, and when did it start?"     Yes, 100.6 F today 7. CARDIAC HISTORY: "Do you have any history of heart disease?" (e.g., heart attack, congestive heart failure)      No 8. LUNG HISTORY: "Do you have any history of lung disease?"  (e.g., pulmonary embolus, asthma, emphysema)     No 9. PE RISK FACTORS: "Do you have a history of blood clots?" (or: recent major surgery, recent prolonged travel, bedridden)     No 10. OTHER SYMPTOMS: "Do you have any other symptoms?" (e.g., runny nose, wheezing, chest pain)       Headache, runny nose 11. PREGNANCY: "Is there any chance you are pregnant?" "When was your last menstrual period?"       No 12. TRAVEL: "Have you traveled out of the country  in the last month?" (e.g., travel history, exposures)       No  Protocols used: Cough - Acute Non-Productive-A-AH

## 2023-08-25 NOTE — Telephone Encounter (Signed)
Will see her then

## 2023-08-26 ENCOUNTER — Ambulatory Visit (INDEPENDENT_AMBULATORY_CARE_PROVIDER_SITE_OTHER): Payer: Medicare HMO | Admitting: Family Medicine

## 2023-08-26 ENCOUNTER — Encounter: Payer: Self-pay | Admitting: Family Medicine

## 2023-08-26 VITALS — BP 136/74 | HR 76 | Temp 98.0°F | Ht 62.5 in

## 2023-08-26 DIAGNOSIS — J069 Acute upper respiratory infection, unspecified: Secondary | ICD-10-CM | POA: Diagnosis not present

## 2023-08-26 DIAGNOSIS — R051 Acute cough: Secondary | ICD-10-CM

## 2023-08-26 LAB — POCT INFLUENZA A/B
Influenza A, POC: NEGATIVE
Influenza B, POC: NEGATIVE

## 2023-08-26 LAB — POC COVID19 BINAXNOW: SARS Coronavirus 2 Ag: NEGATIVE

## 2023-08-26 MED ORDER — BENZONATATE 200 MG PO CAPS: 200.0000 mg | ORAL_CAPSULE | Freq: Three times a day (TID) | ORAL | 1 refills | Status: DC | PRN

## 2023-08-26 NOTE — Progress Notes (Signed)
Subjective:    Patient ID: Cheryl Joseph, female    DOB: [birth year], 83 y.o.   MRN: [id number]  HPI  Wt Readings from Last 3 Encounters:  07/22/23 204 lb 2 oz (92.6 kg)  04/13/23 192 lb 8 oz (87.3 kg)  02/16/23 200 lb 4 oz (90.8 kg)   36.74 kg/m  Vitals:   08/26/23 1213  BP: 136/74  Pulse: 76  Temp: 98 F (36.7 C)  SpO2: 93%   Pt presents with uri symptoms for 5 days   Fever-yesterday 100.6   Cough-dry cough  Not barking  Some hoarseness- improved today  Lot of sneezing   Headache - top of head  Face did hurt-improved today  Ear pain - on and off / this am -but a little better now   Throat is not sore / feels full Pnd   Mucous is all clear   No n/v/d    Over the counter  Cough syrup (helps some) - a mucinex type ?  Tylenol yesterday   Husband has a bad cough   Results for orders placed or performed in visit on 08/26/23  POC COVID-19   Collection Time: 08/26/23 12:32 PM  Result Value Ref Range   SARS Coronavirus 2 Ag Negative Negative  POCT Influenza A/B   Collection Time: 08/26/23 12:32 PM  Result Value Ref Range   Influenza A, POC Negative Negative   Influenza B, POC Negative Negative      Patient Active Problem List   Diagnosis Date Noted   Viral URI with cough 08/26/2023   Pelvic prolapse 07/22/2023   Pain of right thumb 02/16/2023   Trigger thumb 02/16/2023   Encounter for screening mammogram for breast cancer 12/28/2022   Cerumen impaction 12/28/2022   Cough 12/13/2022   Contact dermatitis 09/28/2022   Chronic headache 02/25/2022   Snoring 02/25/2022   History of stroke 12/31/2021   Umbilical hernia 01/05/2021   Pre-operative general physical examination 07/06/2019   Estrogen deficiency 05/25/2016   Routine general medical examination at a health care facility 11/23/2015   Hammertoe 10/28/2015   Diverticulosis of colon without hemorrhage 02/14/2015   Colon cancer screening 08/19/2014   LLQ pain 03/18/2014   Essential  hypertension 02/25/2014   Encounter for Medicare annual wellness exam 07/16/2013   Prediabetes 05/14/2009   Rheumatoid arthritis with positive rheumatoid factor (HCC) 05/13/2008   Osteopenia 03/07/2007   Hyperlipidemia 01/03/2007   Obesity (BMI 30-39.9) 01/03/2007   ALLERGIC RHINITIS, SEASONAL 01/03/2007   Asthma 01/03/2007   OVERACTIVE BLADDER 01/03/2007   Past Medical History:  Diagnosis Date   Anginal pain (HCC)    Arthritis    RA hands(seronegative)   Asthma    Back pain    Cancer (HCC)    skin   Cataract    HPV in female 1996   HPV with colposcopy (all neg paps since)   Hyperlipidemia    Hypertension    Incarcerated ventral hernia    Obesity    Osteopenia    Pre-diabetes    Shortness of breath dyspnea    Shoulder pain    Past Surgical History:  Procedure Laterality Date   CATARACT EXTRACTION, BILATERAL Bilateral    COLONOSCOPY  06/01/2004   COLONOSCOPY WITH PROPOFOL N/A 05/01/2015   Procedure: COLONOSCOPY WITH PROPOFOL;  Surgeon: Scot Jun, MD;  Location: [facility] END[facility]COPY;  Service: Endoscopy;  Laterality: N/A;   CORRECTION HAMMER TOE Right    2nd toe   JOINT REPLACEMENT Right  right knee replacement January 2021   SQUAMOUS CELL CARCINOMA EXCISION Left 12/2017   left arm   TONSILLECTOMY     VAGINAL HYSTERECTOMY  2015   with pelvic organ prolapse surgery    XI ROBOTIC ASSISTED VENTRAL HERNIA N/A 06/10/2021   Procedure: XI ROBOTIC ASSISTED VENTRAL HERNIA;  Surgeon: Carolan Shiver, MD;  Location: [facility] ORS;  Service: General;  Laterality: N/A;   Social History   Tobacco Use   Smoking status: Never   Smokeless tobacco: Never  Vaping Use   Vaping status: Never Used  Substance Use Topics   Alcohol use: No    Alcohol/week: 0.0 standard drinks of alcohol   Drug use: No   Family History  Problem Relation Age of Onset   Diabetes Mother    Hyperlipidemia Mother    Heart disease Mother    Hypertension Mother    Obesity Mother    Kyphosis  Mother    Heart disease Father    Hypertension Father    Cancer Father        lung Cancer smoker   Diabetes Daughter    Kyphosis Sister    Breast cancer Neg Hx    Allergies  Allergen Reactions   Atorvastatin     Headache    Tolterodine Tartrate Other (See Comments)    Unknown (back ache)   Vesicare [Solifenacin Succinate]     Eye problems    Zetia [Ezetimibe]     Headache    Current Outpatient Medications on File Prior to Visit  Medication Sig Dispense Refill   acetaminophen (TYLENOL) 500 MG tablet Take 1,000 mg by mouth every 6 (six) hours as needed for mild pain.     albuterol (VENTOLIN HFA) 108 (90 Base) MCG/ACT inhaler INHALE TWO PUFFS BY MOUTH EVERY 4 HOURS AS NEEDED FOR WHEEZING AND 2 PUFFS BEFORE EXERCISE OR EXPOSURE TO COLD AIR 18 each 0   amLODipine (NORVASC) 5 MG tablet Take 1 tablet (5 mg total) by mouth daily. 90 tablet 3   aspirin EC 81 MG tablet Take 1 tablet (81 mg total) by mouth daily. Swallow whole.     b complex vitamins tablet Take 1 tablet by mouth every Monday, Wednesday, and Friday.     Cholecalciferol (VITAMIN D3 PO) Take 2 tablets by mouth daily.     Hyaluronic Acid-Vitamin C (HYALURONIC ACID PO) Take 1 tablet by mouth every Monday, Wednesday, and Friday.     montelukast (SINGULAIR) 10 MG tablet Take 1 tablet (10 mg total) by mouth at bedtime. 90 tablet 3   Multiple Vitamin (MULTIVITAMIN) capsule Take 1 capsule by mouth daily. Reported on 12/18/2015     Omega-3 Fatty Acids (FISH OIL PO) Take 1 capsule by mouth daily. Reported on 12/18/2015     OVER THE COUNTER MEDICATION Take 2 capsules by mouth daily. BONE UP     oxybutynin (OXYTROL) 3.9 MG/24HR Place 1 patch onto the skin once a week. Sunday.  Home med.     Probiotic Product (PROBIOTIC DAILY PO) Take 1 tablet by mouth daily.     Pumpkin Seed-Soy Germ (AZO BLADDER CONTROL/GO-LESS PO) Take 1 capsule by mouth at bedtime.     rosuvastatin (CRESTOR) 20 MG tablet Take 1 tablet (20 mg total) by mouth every  other day. Takes 3 days per week 45 tablet 3   simvastatin (ZOCOR) 40 MG tablet Take 1 tablet (40 mg total) by mouth every other day. 45 tablet 3   TURMERIC PO Take 720 mg by mouth every  Monday, Wednesday, and Friday.     Ubiquinol 100 MG CAPS Take 1 capsule by mouth every Monday, Wednesday, and Friday.     No current facility-administered medications on file prior to visit.    Review of Systems  Constitutional:  Positive for appetite change and fatigue. Negative for fever.  HENT:  Positive for congestion, postnasal drip, rhinorrhea, sinus pressure, sneezing and sore throat. Negative for ear pain and sinus pain.        Sinus pain is better now / had it this am  Eyes:  Negative for pain and discharge.  Respiratory:  Positive for cough. Negative for shortness of breath, wheezing and stridor.   Cardiovascular:  Negative for chest pain.  Gastrointestinal:  Negative for diarrhea, nausea and vomiting.  Genitourinary:  Negative for frequency, hematuria and urgency.  Musculoskeletal:  Negative for arthralgias and myalgias.  Skin:  Negative for rash.  Neurological:  Positive for headaches. Negative for dizziness, weakness and light-headedness.  Psychiatric/Behavioral:  Negative for confusion and dysphoric mood.        Objective:   Physical Exam Constitutional:      General: She is not in acute distress.    Appearance: Normal appearance. She is well-developed. She is obese. She is not ill-appearing, toxic-appearing or diaphoretic.  HENT:     Head: Normocephalic and atraumatic.     Comments: Nares are injected and congested      Right Ear: Tympanic membrane, ear canal and external ear normal.     Left Ear: Tympanic membrane, ear canal and external ear normal.     Nose: Congestion and rhinorrhea present.     Mouth/Throat:     Mouth: Mucous membranes are moist.     Pharynx: Oropharynx is clear. No oropharyngeal exudate or posterior oropharyngeal erythema.     Comments: Clear pnd  Eyes:      General:        Right eye: No discharge.        Left eye: No discharge.     Conjunctiva/sclera: Conjunctivae normal.     Pupils: Pupils are equal, round, and reactive to light.  Cardiovascular:     Rate and Rhythm: Normal rate.     Heart sounds: Normal heart sounds.  Pulmonary:     Effort: Pulmonary effort is normal. No respiratory distress.     Breath sounds: Normal breath sounds. No stridor. No wheezing, rhonchi or rales.  Chest:     Chest wall: No tenderness.  Musculoskeletal:     Cervical back: Normal range of motion and neck supple.  Lymphadenopathy:     Cervical: No cervical adenopathy.  Skin:    General: Skin is warm and dry.     Capillary Refill: Capillary refill takes less than 2 seconds.     Findings: No rash.  Neurological:     Mental Status: She is alert.     Cranial Nerves: No cranial nerve deficit.  Psychiatric:        Mood and Affect: Mood normal.           Assessment & Plan:   Problem List Items Addressed This Visit       Respiratory   Viral URI with cough - Primary   Day 5 / starting to improve  Neg covid and flu tests  No wheeze or stridor / reassuring exam  Doubt bacterial infection (no colored mucouse and day 5)  Symptom care-see AVS  Tessalon prn  Update if not starting to improve in a week or  if worsening  Call back and Er precautions noted in detail today          Other   Cough   Relevant Orders   POC COVID-19 (Completed)   POCT Influenza A/B (Completed)

## 2023-08-26 NOTE — Patient Instructions (Addendum)
Drink fluids and rest  mucinex DM is good for cough and congestion  Nasal saline for congestion as needed  Tylenol for fever or pain or headache  Try gentle warm compress on face for pressure or pain   Please alert [place] if symptoms worsen (if severe or short of breath please go to the ER)    If temp goes up let us know  If you have trouble breathing - go to ER    Try tessalon prescription for additional cough

## 2023-08-26 NOTE — Assessment & Plan Note (Signed)
Day 5 / starting to improve  Neg covid and flu tests  No wheeze or stridor / reassuring exam  Doubt bacterial infection (no colored mucouse and day 5)  Symptom care-see AVS  Tessalon prn  Update if not starting to improve in a week or if worsening  Call back and Er precautions noted in detail today

## 2023-09-23 DIAGNOSIS — L570 Actinic keratosis: Secondary | ICD-10-CM | POA: Diagnosis not present

## 2023-10-10 ENCOUNTER — Ambulatory Visit: Payer: Self-pay | Admitting: Family Medicine

## 2023-10-10 NOTE — Telephone Encounter (Signed)
I will see her tomorrow as planned  

## 2023-10-10 NOTE — Telephone Encounter (Signed)
 Red Word that prompted transfer to Nurse Triage: patient has a rash and it itches bad and its beginning to spread                 Chief Complaint: Rash to neck, shoulders, chin. Symptoms: Itchy Frequency: Weekend Pertinent Negatives: Patient denies  Disposition: [] ED /[] Urgent Care (no appt availability in office) / [x] Appointment(In office/virtual)/ []  [place] [place] [facility]/ [] Home Care/ [] Refused Recommended Disposition /[] [place] [facility]/ []  Follow-up with PCP Additional Notes: Agrees with appointment.  Reason for Disposition  [1] Severe localized itching AND [2] after 2 days of steroid cream  Answer Assessment - Initial Assessment Questions 1. APPEARANCE of RASH: "Describe the rash."      Red bumps 2. LOCATION: "Where is the rash located?"      Chin, neck, shoulders 3. NUMBER: "How many spots are there?"      Several 4. SIZE: "How big are the spots?" (Inches, centimeters or compare to size of a coin)      Small 5. ONSET: "When did the rash start?"      Saturday 6. ITCHING: "Does the rash itch?" If Yes, ask: "How bad is the itch?"  (Scale 0-10; or none, mild, moderate, severe)     Severe 7. PAIN: "Does the rash hurt?" If Yes, ask: "How bad is the pain?"  (Scale 0-10; or none, mild, moderate, severe)    - NONE (0): no pain    - MILD (1-3): doesn't interfere with normal activities     - MODERATE (4-7): interferes with normal activities or awakens from sleep     - SEVERE (8-10): excruciating pain, unable to do any normal activities     No 8. OTHER SYMPTOMS: "Do you have any other symptoms?" (e.g., fever)     No 9. PREGNANCY: "Is there any chance you are pregnant?" "When was your last menstrual period?"     No  Protocols used: Rash or Redness - Localized-A-AH

## 2023-10-11 ENCOUNTER — Encounter: Payer: Self-pay | Admitting: Family Medicine

## 2023-10-11 ENCOUNTER — Ambulatory Visit (INDEPENDENT_AMBULATORY_CARE_PROVIDER_SITE_OTHER): Admitting: Family Medicine

## 2023-10-11 VITALS — BP 134/72 | HR 65 | Temp 97.7°F | Ht 62.5 in | Wt 204.0 lb

## 2023-10-11 DIAGNOSIS — R21 Rash and other nonspecific skin eruption: Secondary | ICD-10-CM | POA: Insufficient documentation

## 2023-10-11 MED ORDER — TRIAMCINOLONE ACETONIDE 0.1 % EX CREA: 1.0000 | TOPICAL_CREAM | Freq: Two times a day (BID) | CUTANEOUS | 0 refills | Status: AC

## 2023-10-11 NOTE — Assessment & Plan Note (Signed)
 Neck and chest / small area on left arm  Improved with topical benadryl (also one oral dose) Mild today  Suspect contact dermatitis of some sort  Reviewed exposures and products See AVS Triamcinolone cream 0.1% prescribed to use prn Update if not starting to improve in a week or if worsening  Call back and Er precautions noted in detail today

## 2023-10-11 NOTE — Progress Notes (Signed)
 Subjective:    Patient ID: Cheryl Joseph, female    DOB: 1941-05-17, 83 y.o.   MRN: [id number]  HPI  Wt Readings from Last 3 Encounters:  10/11/23 204 lb (92.5 kg)  07/22/23 204 lb 2 oz (92.6 kg)  04/13/23 192 lb 8 oz (87.3 kg)   36.72 kg/m  Vitals:   10/11/23 1452  BP: 134/72  Pulse: 65  Temp: 97.7 F (36.5 C)  SpO2: 96%   Pt presents for rash of neck and upper torso   Started Saturday afternoon  Itching -chin and neck  Then developed rash next day on chest  Monday area on left arm    Putting topical benadryl on it  One oral benadryl  Is improved today   Had a scab on neck Cannot rule out bug bite    No new products   Uses fragrance free detergent  Dove soap         Patient Active Problem List   Diagnosis Date Noted   Rash of neck 10/11/2023   Viral URI with cough 08/26/2023   Pelvic prolapse 07/22/2023   Pain of right thumb 02/16/2023   Trigger thumb 02/16/2023   Encounter for screening mammogram for breast cancer 12/28/2022   Cerumen impaction 12/28/2022   Cough 12/13/2022   Contact dermatitis 09/28/2022   Chronic headache 02/25/2022   Snoring 02/25/2022   History of stroke 12/31/2021   Umbilical hernia 01/05/2021   Pre-operative general physical examination 07/06/2019   Estrogen deficiency 05/25/2016   Routine general medical examination at a health care facility 11/23/2015   Hammertoe 10/28/2015   Diverticulosis of colon without hemorrhage 02/14/2015   Colon cancer screening 08/19/2014   LLQ pain 03/18/2014   Essential hypertension 02/25/2014   Encounter for Medicare annual wellness exam 07/16/2013   Prediabetes 05/14/2009   Rheumatoid arthritis with positive rheumatoid factor (HCC) 05/13/2008   Osteopenia 03/07/2007   Hyperlipidemia 01/03/2007   Obesity (BMI 30-39.9) 01/03/2007   ALLERGIC RHINITIS, SEASONAL 01/03/2007   Asthma 01/03/2007   OVERACTIVE BLADDER 01/03/2007   Past Medical History:  Diagnosis Date   Anginal pain  (HCC)    Arthritis    RA hands(seronegative)   Asthma    Back pain    Cancer (HCC)    skin   Cataract    HPV in female 1996   HPV with colposcopy (all neg paps since)   Hyperlipidemia    Hypertension    Incarcerated ventral hernia    Obesity    Osteopenia    Pre-diabetes    Shortness of breath dyspnea    Shoulder pain    Past Surgical History:  Procedure Laterality Date   CATARACT EXTRACTION, BILATERAL Bilateral    COLONOSCOPY  06/01/2004   COLONOSCOPY WITH PROPOFOL N/A 05/01/2015   Procedure: COLONOSCOPY WITH PROPOFOL;  Surgeon: Scot Jun, MD;  Location: [facility] ENDOSCOPY;  Service: Endoscopy;  Laterality: N/A;   CORRECTION HAMMER TOE Right    2nd toe   JOINT REPLACEMENT Right    right knee replacement January 2021   SQUAMOUS CELL CARCINOMA EXCISION Left 12/2017   left arm   TONSILLECTOMY     VAGINAL HYSTERECTOMY  2015   with pelvic organ prolapse surgery    XI ROBOTIC ASSISTED VENTRAL HERNIA N/A 06/10/2021   Procedure: XI ROBOTIC ASSISTED VENTRAL HERNIA;  Surgeon: Carolan Shiver, MD;  Location: [facility] ORS;  Service: General;  Laterality: N/A;   Social History   Tobacco Use   Smoking status: Never  Smokeless tobacco: Never  Vaping Use   Vaping status: Never Used  Substance Use Topics   Alcohol use: No    Alcohol/week: 0.0 standard drinks of alcohol   Drug use: No   Family History  Problem Relation Age of Onset   Diabetes Mother    Hyperlipidemia Mother    Heart disease Mother    Hypertension Mother    Obesity Mother    Kyphosis Mother    Heart disease Father    Hypertension Father    Cancer Father        lung Cancer smoker   Diabetes Daughter    Kyphosis Sister    Breast cancer Neg Hx    Allergies  Allergen Reactions   Atorvastatin     Headache    Tolterodine Tartrate Other (See Comments)    Unknown (back ache)   Vesicare [Solifenacin Succinate]     Eye problems    Zetia [Ezetimibe]     Headache    Current Outpatient Medications  on File Prior to Visit  Medication Sig Dispense Refill   acetaminophen (TYLENOL) 500 MG tablet Take 1,000 mg by mouth every 6 (six) hours as needed for mild pain.     albuterol (VENTOLIN HFA) 108 (90 Base) MCG/ACT inhaler INHALE TWO PUFFS BY MOUTH EVERY 4 HOURS AS NEEDED FOR WHEEZING AND 2 PUFFS BEFORE EXERCISE OR EXPOSURE TO COLD AIR 18 each 0   amLODipine (NORVASC) 5 MG tablet Take 1 tablet (5 mg total) by mouth daily. 90 tablet 3   aspirin EC 81 MG tablet Take 1 tablet (81 mg total) by mouth daily. Swallow whole.     b complex vitamins tablet Take 1 tablet by mouth every Monday, Wednesday, and Friday.     benzonatate (TESSALON) 200 MG capsule Take 1 capsule (200 mg total) by mouth 3 (three) times daily as needed for cough. Swallow whole 30 capsule 1   Cholecalciferol (VITAMIN D3 PO) Take 2 tablets by mouth daily.     Hyaluronic Acid-Vitamin C (HYALURONIC ACID PO) Take 1 tablet by mouth every Monday, Wednesday, and Friday.     montelukast (SINGULAIR) 10 MG tablet Take 1 tablet (10 mg total) by mouth at bedtime. 90 tablet 3   Multiple Vitamin (MULTIVITAMIN) capsule Take 1 capsule by mouth daily. Reported on 12/18/2015     Omega-3 Fatty Acids (FISH OIL PO) Take 1 capsule by mouth daily. Reported on 12/18/2015     OVER THE COUNTER MEDICATION Take 2 capsules by mouth daily. BONE UP     oxybutynin (OXYTROL) 3.9 MG/24HR Place 1 patch onto the skin once a week. Sunday.  Home med.     Probiotic Product (PROBIOTIC DAILY PO) Take 1 tablet by mouth daily.     Pumpkin Seed-Soy Germ (AZO BLADDER CONTROL/GO-LESS PO) Take 1 capsule by mouth at bedtime.     rosuvastatin (CRESTOR) 20 MG tablet Take 1 tablet (20 mg total) by mouth every other day. Takes 3 days per week 45 tablet 3   simvastatin (ZOCOR) 40 MG tablet Take 1 tablet (40 mg total) by mouth every other day. 45 tablet 3   TURMERIC PO Take 720 mg by mouth every Monday, Wednesday, and Friday.     Ubiquinol 100 MG CAPS Take 1 capsule by mouth every Monday,  Wednesday, and Friday.     No current facility-administered medications on file prior to visit.    Review of Systems     Objective:   Physical Exam Constitutional:  General: She is not in acute distress.    Appearance: Normal appearance. She is obese. She is not ill-appearing or diaphoretic.  HENT:     Mouth/Throat:     Mouth: Mucous membranes are moist.     Pharynx: Oropharynx is clear.  Eyes:     General:        Right eye: No discharge.        Left eye: No discharge.     Conjunctiva/sclera: Conjunctivae normal.     Pupils: Pupils are equal, round, and reactive to light.  Cardiovascular:     Rate and Rhythm: Normal rate and regular rhythm.  Pulmonary:     Effort: Pulmonary effort is normal. No respiratory distress.     Breath sounds: No wheezing.  Musculoskeletal:     Cervical back: Normal range of motion and neck supple. No tenderness.  Lymphadenopathy:     Cervical: No cervical adenopathy.  Skin:    Findings: Rash present. No bruising or lesion.     Comments: Papular rash/pink in color on anterior neck, chest (worse on right) and small area on left ant forearm  Per pt improved from yesterday  One comedone on neck- expressed this / no signs of infection    Neurological:     Mental Status: She is alert.     Cranial Nerves: No cranial nerve deficit.  Psychiatric:        Mood and Affect: Mood normal.           Assessment & Plan:   Problem List Items Addressed This Visit       Musculoskeletal and Integument   Rash of neck - Primary   Neck and chest / small area on left arm  Improved with topical benadryl (also one oral dose) Mild today  Suspect contact dermatitis of some sort  Reviewed exposures and products See AVS Triamcinolone cream 0.1% prescribed to use prn Update if not starting to improve in a week or if worsening  Call back and Er precautions noted in detail today

## 2023-10-11 NOTE — Patient Instructions (Addendum)
 Keep rash area clean with dove soap and water  Stay cool  Avoid fragrances  Avoid hot water and harsh detergents   Try the triamcinolone cream   Antihistamine like zyrtec 10 mg over the counter is helpful for itching if needed   Update if not starting to improve in a week or if worsening or if symptoms change

## 2023-10-27 ENCOUNTER — Ambulatory Visit: Payer: Medicare HMO

## 2023-10-27 VITALS — Ht 62.5 in | Wt 204.0 lb

## 2023-10-27 DIAGNOSIS — Z0000 Encounter for general adult medical examination without abnormal findings: Secondary | ICD-10-CM

## 2023-10-27 NOTE — Progress Notes (Signed)
 Subjective:   Cheryl Joseph is a 83 y.o. who presents for a Medicare Wellness preventive visit.  Visit Complete: Virtual I connected with  [name] on 10/27/23 by a audio enabled telemedicine application and verified that I am speaking with the correct person using two identifiers.  Patient Location: Home  Provider Location: Home Office  I discussed the limitations of evaluation and management by telemedicine. The patient expressed understanding and agreed to proceed.  Vital Signs: Because this visit was a virtual/telehealth visit, some criteria may be missing or patient reported. Any vitals not documented were not able to be obtained and vitals that have been documented are patient reported.  VideoDeclined- This patient declined Librarian, academic. Therefore the visit was completed with audio only.  Persons Participating in Visit: Patient.  AWV Questionnaire: No: Patient Medicare AWV questionnaire was not completed prior to this visit.  Cardiac Risk Factors include: advanced age (>60men, >12 women);hypertension;dyslipidemia;obesity (BMI >30kg/m2)     Objective:    Today's Vitals   10/27/23 0933  Weight: 204 lb (92.5 kg)  Height: 5' 2.5" (1.588 m)   Body mass index is 36.72 kg/m.     10/27/2023    9:44 AM 10/25/2022   10:08 AM 02/15/2022    9:04 AM 01/27/2022   10:20 AM 12/31/2021   10:40 PM 12/31/2021   12:01 PM 10/21/2021   11:21 AM  Advanced Directives  Does Patient Have a Medical Advance Directive? Yes No No No No No No  Type of Estate agent of [place];Living will        Copy of Healthcare Power of Attorney in Chart? Yes - validated most recent copy scanned in chart (See row information)        Would patient like information on creating a medical advance directive?   No - Patient declined -- No - Patient declined  No - Patient declined    Current Medications (verified) Outpatient Encounter Medications as of  10/27/2023  Medication Sig   acetaminophen (TYLENOL) 500 MG tablet Take 1,000 mg by mouth every 6 (six) hours as needed for mild pain.   albuterol (VENTOLIN HFA) 108 (90 Base) MCG/ACT inhaler INHALE TWO PUFFS BY MOUTH EVERY 4 HOURS AS NEEDED FOR WHEEZING AND 2 PUFFS BEFORE EXERCISE OR EXPOSURE TO COLD AIR   amLODipine (NORVASC) 5 MG tablet Take 1 tablet (5 mg total) by mouth daily.   aspirin EC 81 MG tablet Take 1 tablet (81 mg total) by mouth daily. Swallow whole.   b complex vitamins tablet Take 1 tablet by mouth every Monday, Wednesday, and Friday.   Cholecalciferol (VITAMIN D3 PO) Take 2 tablets by mouth daily.   Hyaluronic Acid-Vitamin C (HYALURONIC ACID PO) Take 1 tablet by mouth every Monday, Wednesday, and Friday.   montelukast (SINGULAIR) 10 MG tablet Take 1 tablet (10 mg total) by mouth at bedtime.   Multiple Vitamin (MULTIVITAMIN) capsule Take 1 capsule by mouth daily. Reported on 12/18/2015   Omega-3 Fatty Acids (FISH OIL PO) Take 1 capsule by mouth daily. Reported on 12/18/2015   OVER THE COUNTER MEDICATION Take 2 capsules by mouth daily. BONE UP   oxybutynin (OXYTROL) 3.9 MG/24HR Place 1 patch onto the skin once a week. Sunday.  Home med.   Probiotic Product (PROBIOTIC DAILY PO) Take 1 tablet by mouth daily.   Pumpkin Seed-Soy Germ (AZO BLADDER CONTROL/GO-LESS PO) Take 1 capsule by mouth at bedtime.   rosuvastatin (CRESTOR) 20 MG tablet Take 1 tablet (  20 mg total) by mouth every other day. Takes 3 days per week   simvastatin (ZOCOR) 40 MG tablet Take 1 tablet (40 mg total) by mouth every other day.   triamcinolone cream (KENALOG) 0.1 % Apply 1 Application topically 2 (two) times daily. To affected (rash) area   TURMERIC PO Take 720 mg by mouth every Monday, Wednesday, and Friday.   Ubiquinol 100 MG CAPS Take 1 capsule by mouth every Monday, Wednesday, and Friday.   benzonatate (TESSALON) 200 MG capsule Take 1 capsule (200 mg total) by mouth 3 (three) times daily as needed for cough.  Swallow whole (Patient not taking: Reported on 10/27/2023)   No facility-administered encounter medications on file as of 10/27/2023.    Allergies (verified) Atorvastatin, Tolterodine tartrate, Vesicare [solifenacin succinate], and Zetia [ezetimibe]   History: Past Medical History:  Diagnosis Date   Anginal pain (HCC)    Arthritis    RA hands(seronegative)   Asthma    Back pain    Cancer (HCC)    skin   Cataract    HPV in female 1996   HPV with colposcopy (all neg paps since)   Hyperlipidemia    Hypertension    Incarcerated ventral hernia    Obesity    Osteopenia    Pre-diabetes    Shortness of breath dyspnea    Shoulder pain    Past Surgical History:  Procedure Laterality Date   CATARACT EXTRACTION, BILATERAL Bilateral    COLONOSCOPY  06/01/2004   COLONOSCOPY WITH PROPOFOL N/A 05/01/2015   Procedure: COLONOSCOPY WITH PROPOFOL;  Surgeon: Scot Jun, MD;  [facility];  Service: Endoscopy;  Laterality: N/A;   CORRECTION HAMMER TOE Right    2nd toe   JOINT REPLACEMENT Right    right knee replacement January 2021   SQUAMOUS CELL CARCINOMA EXCISION Left 12/2017   left arm   TONSILLECTOMY     VAGINAL HYSTERECTOMY  2015   with pelvic organ prolapse surgery    XI ROBOTIC ASSISTED VENTRAL HERNIA N/A 06/10/2021   Procedure: XI ROBOTIC ASSISTED VENTRAL HERNIA;  Surgeon: Carolan Shiver, MD;  Location: [facility] ORS;  Service: General;  Laterality: N/A;   Family History  Problem Relation Age of Onset   Diabetes Mother    Hyperlipidemia Mother    Heart disease Mother    Hypertension Mother    Obesity Mother    Kyphosis Mother    Heart disease Father    Hypertension Father    Cancer Father        lung Cancer smoker   Diabetes Daughter    Kyphosis Sister    Breast cancer Neg Hx    Social History   Socioeconomic History   Marital status: Married    Spouse name: Rudell Cobb   Number of children: 3   Years of education: Not on file   Highest education  level: Not on file  Occupational History   Not on file  Tobacco Use   Smoking status: Never   Smokeless tobacco: Never  Vaping Use   Vaping status: Never Used  Substance and Sexual Activity   Alcohol use: No    Alcohol/week: 0.0 standard drinks of alcohol   Drug use: No   Sexual activity: Yes  Other Topics Concern   Not on file  Social History Narrative   Remarried.   19 grandchildren between pt and husband.   7 great grandchildren.   Social Drivers of Health   Financial Resource Strain: Low Risk  (10/27/2023)  Overall Financial Resource Strain (CARDIA)    Difficulty of Paying Living Expenses: Not hard at all  Food Insecurity: No Food Insecurity (10/27/2023)   Hunger Vital Sign    Worried About Running Out of Food in the Last Year: Never true    Ran Out of Food in the Last Year: Never true  Transportation Needs: No Transportation Needs (10/27/2023)   PRAPARE - Administrator, Civil Service (Medical): No    Lack of Transportation (Non-Medical): No  Physical Activity: Sufficiently Active (10/27/2023)   Exercise Vital Sign    Days of Exercise per Week: 5 days    Minutes of Exercise per Session: 30 min  Stress: No Stress Concern Present (10/27/2023)   [employer] of Occupational Health - Occupational Stress Questionnaire    Feeling of Stress : Not at all  Social Connections: Socially Integrated (10/27/2023)   Social Connection and Isolation Panel [NHANES]    Frequency of Communication with Friends and Family: More than three times a week    Frequency of Social Gatherings with Friends and Family: More than three times a week    Attends Religious Services: More than 4 times per year    Active Member of [employer] or Organizations: Yes    Attends Engineer, structural: More than 4 times per year    Marital Status: Married    Tobacco Counseling Counseling given: Not Answered  Clinical Intake:  Pre-visit preparation completed: Yes  Pain : No/denies pain    BMI - recorded: 36.72 Nutritional Risks: None Diabetes: No  Lab Results  Component Value Date   HGBA1C 6.2 10/29/2022   HGBA1C 6.4 02/25/2022   HGBA1C 6.4 10/21/2021     How often do you need to have someone help you when you read instructions, pamphlets, or other written materials from your doctor or pharmacy?: 1 - Never  Interpreter Needed?: No  Comments: lives with husband Information entered by :: B.Cleta Heatley,LPN   Activities of Daily Living     10/27/2023    9:45 AM  In your present state of health, do you have any difficulty performing the following activities:  Hearing? 0  Vision? 0  Difficulty concentrating or making decisions? 1  Comment difficulty bringing up words  Walking or climbing stairs? 1  Comment climbing stairs w/both feet  Dressing or bathing? 0  Doing errands, shopping? 0  Preparing Food and eating ? N  Using the Toilet? N  In the past six months, have you accidently leaked urine? Y  Do you have problems with loss of bowel control? N  Managing your Medications? N  Managing your Finances? N  Housekeeping or managing your Housekeeping? N    Patient Care Team: [place], Audrie Gallus, MD as PCP - General Galen Manila, MD as Referring Physician (Ophthalmology) Elinor Parkinson, DPM as Consulting Physician (Podiatry) Alwyn Pea, MD as Consulting Physician (Cardiology)  Indicate any recent Medical Services you may have received from other than [facility] providers in the past year (date may be approximate).     Assessment:   This is a routine wellness examination for Heleena.  Hearing/Vision screen Hearing Screening - Comments:: Pt says her hearing is ok Vision Screening - Comments:: Pt says her vision is good with glasses Dr Katha Hamming in June   Goals Addressed             This Visit's Progress    Patient Stated   On track    10/27/23-, I will continue to exercise  on stationary bike for 20 minutes daily and to walk or do strength training  for 10-15 minutes.     COMPLETED: Patient Stated       10/10/2020, I will my ride stationary bike for about 20 minutes and walk 10 minutes everyday.       Depression Screen     10/27/2023    9:40 AM 10/11/2023    3:00 PM 07/22/2023   11:10 AM 02/16/2023    8:26 AM 10/25/2022   10:06 AM 09/28/2022   11:03 AM 02/25/2022    9:04 AM  PHQ 2/9 Scores  PHQ - 2 Score 0 0 0 0 0 0 0  PHQ- 9 Score  0 0 1       Fall Risk     10/27/2023    9:35 AM 10/11/2023    3:00 PM 07/22/2023   11:09 AM 02/16/2023    8:26 AM 10/25/2022   10:00 AM  Fall Risk   Falls in the past year? 1 1 0 0 0  Number falls in past yr: 0 0 0 0 0  Injury with Fall? 1 0 0 0 0  Risk for fall due to : No Fall Risks History of fall(s) No Fall Risks No Fall Risks No Fall Risks;Impaired balance/gait  Risk for fall due to: Comment     off balance at times  Follow up Education provided;Falls prevention discussed Falls evaluation completed Falls evaluation completed Falls evaluation completed Education provided;Falls prevention discussed    MEDICARE RISK AT HOME:  Medicare Risk at Home Any stairs in or around the home?: No If so, are there any without handrails?: No Home free of loose throw rugs in walkways, pet beds, electrical cords, etc?: Yes Adequate lighting in your home to reduce risk of falls?: Yes Life alert?: No Use of a cane, walker or w/c?: No Grab bars in the bathroom?: Yes Shower chair or bench in shower?: Yes Elevated toilet seat or a handicapped toilet?: Yes  TIMED UP AND GO:  Was the test performed?  No  Cognitive Function: 6CIT completed    10/10/2020    8:33 AM 09/14/2019    9:40 AM 07/31/2018    8:32 AM 06/01/2017   10:17 AM 05/31/2016   10:24 AM  MMSE - Mini Mental State Exam  Orientation to time 5 5 5 5 5   Orientation to Place 5 5 5 5 5   Registration 3 3 3 3 3   Attention/ Calculation 5 5 0 0 0  Recall 3 3 3 3 3   Language- name 2 objects   0 0 0  Language- repeat 1 1 1 1 1   Language- follow 3 step  command   3 3 3   Language- read & follow direction   0 0 0  Write a sentence   0 0 0  Copy design   0 0 0  Total score   20 20 20         10/27/2023    9:48 AM 10/25/2022   10:10 AM  6CIT Screen  What Year? 0 points 0 points  What month? 0 points 0 points  What time? 0 points 0 points  Count back from 20 0 points 0 points  Months in reverse 0 points 0 points  Repeat phrase 0 points 0 points  Total Score 0 points 0 points    Immunizations Immunization History  Administered Date(s) Administered   Fluad Quad(high Dose 65+) 04/26/2019, 05/01/2020, 04/23/2022   Fluad Trivalent(High Dose 65+) 05/09/2023  Influenza Split 05/27/2011, 05/01/2012   Influenza Whole 04/27/2007, 05/23/2008, 05/14/2009, 05/05/2010   Influenza,inj,Quad PF,6+ Mos 05/21/2013, 04/26/2014, 05/09/2015, 05/19/2016, 04/26/2017, 05/18/2018   Influenza,inj,quad, With Preservative 04/26/2016   PFIZER(Purple Top)SARS-COV-2 Vaccination 09/24/2019, 10/15/2019, 05/14/2020   Pneumococcal Conjugate-13 08/19/2014   Pneumococcal Polysaccharide-23 05/31/2005   Pneumococcal-Unspecified 04/28/2015   Td 11/24/1998, 05/14/2009   Zoster Recombinant(Shingrix) 10/25/2022, 01/10/2023   Zoster, Live 03/05/2008    Screening Tests Health Maintenance  Topic Date Due   HEMOGLOBIN A1C  04/30/2023   COVID-19 Vaccine (4 - 2024-25 season) 10/26/2024 (Originally 03/27/2023)   Diabetic kidney evaluation - Urine ACR  11/07/2027 (Originally 01/14/1959)   MAMMOGRAM  01/10/2024   INFLUENZA VACCINE  02/24/2024   Diabetic kidney evaluation - eGFR measurement  04/12/2024   Medicare Annual Wellness (AWV)  10/26/2024   Pneumonia Vaccine 42+ Years old  Completed   DEXA SCAN  Completed   Zoster Vaccines- Shingrix  Completed   HPV VACCINES  Aged Out   DTaP/Tdap/Td  Discontinued   FOOT EXAM  Discontinued   OPHTHALMOLOGY EXAM  Discontinued    Health Maintenance  Health Maintenance Due  Topic Date Due   HEMOGLOBIN A1C  04/30/2023   Health  Maintenance Items Addressed: None needed  Additional Screening:  Vision Screening: Recommended annual ophthalmology exams for early detection of glaucoma and other disorders of the eye.  Dental Screening: Recommended annual dental exams for proper oral hygiene  Community Resource Referral / Chronic Care Management: CRR required this visit?  No   CCM required this visit?  No     Plan:     I have personally reviewed and noted the following in the patient's chart:   Medical and social history Use of alcohol, tobacco or illicit drugs  Current medications and supplements including opioid prescriptions. Patient is not currently taking opioid prescriptions. Functional ability and status Nutritional status Physical activity Advanced directives List of other physicians Hospitalizations, surgeries, and ER visits in previous 12 months Vitals Screenings to include cognitive, depression, and falls Referrals and appointments  In addition, I have reviewed and discussed with patient certain preventive protocols, quality metrics, and best practice recommendations. A written personalized care plan for preventive services as well as general preventive health recommendations were provided to patient.     Sue Lush, LPN   01/31/4695   After Visit Summary: (MyChart) Due to this being a telephonic visit, the after visit summary with patients personalized plan was offered to patient via MyChart   Notes: Nothing significant to report at this time. See Wrap Up

## 2023-10-27 NOTE — Patient Instructions (Signed)
 Cheryl Joseph , Thank you for taking time to come for your Medicare Wellness Visit. I appreciate your ongoing commitment to your health goals. Please review the following plan we discussed and let me know if I can assist you in the future.   Referrals/Orders/Follow-Ups/Clinician Recommendations: none  This is a list of the screening recommended for you and due dates:  Health Maintenance  Topic Date Due   Hemoglobin A1C  04/30/2023   COVID-19 Vaccine (4 - 2024-25 season) 10/26/2024*   Yearly kidney health urinalysis for diabetes  11/07/2027*   Mammogram  01/10/2024   Flu Shot  02/24/2024   Yearly kidney function blood test for diabetes  04/12/2024   Medicare Annual Wellness Visit  10/26/2024   Pneumonia Vaccine  Completed   DEXA scan (bone density measurement)  Completed   Zoster (Shingles) Vaccine  Completed   HPV Vaccine  Aged Out   DTaP/Tdap/Td vaccine  Discontinued   Complete foot exam   Discontinued   Eye exam for diabetics  Discontinued  *Topic was postponed. The date shown is not the original due date.    Advanced directives: (Copy Requested) Please bring a copy of your health care power of attorney and living will to the office to be added to your chart at your convenience. You can mail to [facility] [address] [place], [place] [address] or email to [email or web address]  Next Medicare Annual Wellness Visit scheduled for next year: Yes 10/27/23 @ 9:30am televisit

## 2023-11-01 ENCOUNTER — Telehealth: Payer: Self-pay | Admitting: Family Medicine

## 2023-11-01 DIAGNOSIS — I10 Essential (primary) hypertension: Secondary | ICD-10-CM

## 2023-11-01 DIAGNOSIS — E669 Obesity, unspecified: Secondary | ICD-10-CM

## 2023-11-01 DIAGNOSIS — E785 Hyperlipidemia, unspecified: Secondary | ICD-10-CM

## 2023-11-01 DIAGNOSIS — R7303 Prediabetes: Secondary | ICD-10-CM

## 2023-11-01 NOTE — Telephone Encounter (Signed)
-----   Message from Alvina Chou sent at 10/14/2023  2:24 PM EDT ----- Regarding: Lab orders for [facility], 4.10.25 Patient is scheduled for CPX labs, please order future labs, Thanks , Camelia Eng

## 2023-11-03 ENCOUNTER — Other Ambulatory Visit (INDEPENDENT_AMBULATORY_CARE_PROVIDER_SITE_OTHER): Payer: Medicare HMO

## 2023-11-03 DIAGNOSIS — R7303 Prediabetes: Secondary | ICD-10-CM | POA: Diagnosis not present

## 2023-11-03 DIAGNOSIS — I10 Essential (primary) hypertension: Secondary | ICD-10-CM | POA: Diagnosis not present

## 2023-11-03 DIAGNOSIS — E785 Hyperlipidemia, unspecified: Secondary | ICD-10-CM

## 2023-11-03 LAB — CBC WITH DIFFERENTIAL/PLATELET
Basophils Absolute: 0.1 10*3/uL (ref 0.0–0.1)
Basophils Relative: 1.1 % (ref 0.0–3.0)
Eosinophils Absolute: 0.2 10*3/uL (ref 0.0–0.7)
Eosinophils Relative: 2.9 % (ref 0.0–5.0)
HCT: 44.6 % (ref 36.0–46.0)
Hemoglobin: 14.9 g/dL (ref 12.0–15.0)
Lymphocytes Relative: 34.6 % (ref 12.0–46.0)
Lymphs Abs: 2 10*3/uL (ref 0.7–4.0)
MCHC: 33.3 g/dL (ref 30.0–36.0)
MCV: 96 fl (ref 78.0–100.0)
Monocytes Absolute: 0.6 10*3/uL (ref 0.1–1.0)
Monocytes Relative: 10 % (ref 3.0–12.0)
Neutro Abs: 3 10*3/uL (ref 1.4–7.7)
Neutrophils Relative %: 51.4 % (ref 43.0–77.0)
Platelets: 243 10*3/uL (ref 150.0–400.0)
RBC: 4.65 Mil/uL (ref 3.87–5.11)
RDW: 13.6 % (ref 11.5–15.5)
WBC: 5.8 10*3/uL (ref 4.0–10.5)

## 2023-11-03 LAB — COMPREHENSIVE METABOLIC PANEL WITH GFR
ALT: 11 U/L (ref 0–35)
AST: 17 U/L (ref 0–37)
Albumin: 4.1 g/dL (ref 3.5–5.2)
Alkaline Phosphatase: 83 U/L (ref 39–117)
BUN: 20 mg/dL (ref 6–23)
CO2: 28 meq/L (ref 19–32)
Calcium: 8.9 mg/dL (ref 8.4–10.5)
Chloride: 106 meq/L (ref 96–112)
Creatinine, Ser: 0.82 mg/dL (ref 0.40–1.20)
GFR: 65.97 mL/min (ref >60.00)
Glucose, Bld: 129 mg/dL — ABNORMAL HIGH (ref 70–99)
Potassium: 4.1 meq/L (ref 3.5–5.1)
Sodium: 142 meq/L (ref 135–145)
Total Bilirubin: 0.7 mg/dL (ref 0.2–1.2)
Total Protein: 6.5 g/dL (ref 6.0–8.3)

## 2023-11-03 LAB — LIPID PANEL
Cholesterol: 176 mg/dL (ref 0–200)
HDL: 48.3 mg/dL (ref >39.00)
LDL Cholesterol: 86 mg/dL (ref 0–99)
NonHDL: 127.93
Total CHOL/HDL Ratio: 4
Triglycerides: 212 mg/dL — ABNORMAL HIGH (ref 0.0–149.0)
VLDL: 42.4 mg/dL — ABNORMAL HIGH (ref 0.0–40.0)

## 2023-11-03 LAB — TSH: TSH: 2.26 u[IU]/mL (ref 0.35–5.50)

## 2023-11-03 LAB — HEMOGLOBIN A1C: Hgb A1c MFr Bld: 6.3 % (ref 4.6–6.5)

## 2023-11-10 ENCOUNTER — Encounter: Payer: Medicare HMO | Admitting: Family Medicine

## 2023-11-11 DIAGNOSIS — S76311A Strain of muscle, fascia and tendon of the posterior muscle group at thigh level, right thigh, initial encounter: Secondary | ICD-10-CM | POA: Diagnosis not present

## 2023-11-18 ENCOUNTER — Encounter: Payer: Self-pay | Admitting: Family Medicine

## 2023-11-18 ENCOUNTER — Ambulatory Visit (INDEPENDENT_AMBULATORY_CARE_PROVIDER_SITE_OTHER): Admitting: Family Medicine

## 2023-11-18 VITALS — BP 135/60 | HR 56 | Temp 98.1°F | Ht 62.5 in | Wt 202.4 lb

## 2023-11-18 DIAGNOSIS — E2839 Other primary ovarian failure: Secondary | ICD-10-CM

## 2023-11-18 DIAGNOSIS — M8589 Other specified disorders of bone density and structure, multiple sites: Secondary | ICD-10-CM

## 2023-11-18 DIAGNOSIS — E669 Obesity, unspecified: Secondary | ICD-10-CM | POA: Diagnosis not present

## 2023-11-18 DIAGNOSIS — R7303 Prediabetes: Secondary | ICD-10-CM

## 2023-11-18 DIAGNOSIS — M059 Rheumatoid arthritis with rheumatoid factor, unspecified: Secondary | ICD-10-CM

## 2023-11-18 DIAGNOSIS — I10 Essential (primary) hypertension: Secondary | ICD-10-CM | POA: Diagnosis not present

## 2023-11-18 DIAGNOSIS — E785 Hyperlipidemia, unspecified: Secondary | ICD-10-CM | POA: Diagnosis not present

## 2023-11-18 DIAGNOSIS — Z1231 Encounter for screening mammogram for malignant neoplasm of breast: Secondary | ICD-10-CM | POA: Diagnosis not present

## 2023-11-18 DIAGNOSIS — Z0000 Encounter for general adult medical examination without abnormal findings: Secondary | ICD-10-CM | POA: Diagnosis not present

## 2023-11-18 MED ORDER — ROSUVASTATIN CALCIUM 20 MG PO TABS: 20.0000 mg | ORAL_TABLET | ORAL | 3 refills | Status: AC

## 2023-11-18 MED ORDER — SIMVASTATIN 40 MG PO TABS: 40.0000 mg | ORAL_TABLET | ORAL | 3 refills | Status: AC

## 2023-11-18 MED ORDER — AMLODIPINE BESYLATE 5 MG PO TABS: 5.0000 mg | ORAL_TABLET | Freq: Every day | ORAL | 3 refills | Status: AC

## 2023-11-18 MED ORDER — ALBUTEROL SULFATE HFA 108 (90 BASE) MCG/ACT IN AERS: INHALATION_SPRAY | RESPIRATORY_TRACT | 3 refills | Status: AC

## 2023-11-18 MED ORDER — MONTELUKAST SODIUM 10 MG PO TABS: 10.0000 mg | ORAL_TABLET | Freq: Every day | ORAL | 3 refills | Status: AC

## 2023-11-18 NOTE — Patient Instructions (Addendum)
 Add some strength training to your routine, this is important for bone and brain health and can reduce your risk of falls and help your body use insulin properly and regulate weight  Light weights, exercise bands , and internet videos are a good way to start  Yoga (chair or regular), machines , floor exercises or a gym with machines are also good options   When you go to PT -tell them you have weakness in both legs but worse on the right  Goal is to be able to get up stairs (at your own pace)   To prevent diabetes Try to get most of your carbohydrates from produce (with the exception of white potatoes) and whole grains Eat less bread/pasta/rice/snack foods/cereals/sweets and other items from the middle of the grocery store (processed carbs)     You have an order for:  []   2D Mammogram  [x]   3D Mammogram  [x]   Bone Density     Please call for appointment:   [x]   [facility]  [address] [place] [address]  [phone]  []   [facility])   [address]  [place], [place] [address]  [phone]  []   [facility]      [address] [place], [place]        [phone]         []   [facility]  [address] [place], [place]  [phone]  []  [facility]   [address]   [place], [place] [address]  [phone]  []  [facility]  [address] [place], [place] [address] [phone]    Make sure to wear two piece clothing  No lotions powders or deodorants the day of the appointment Make sure to bring picture ID and insurance card.  Bring list of medications you are currently taking including any supplements.   Schedule your screening mammogram through MyChart!   Select [place] imaging sites can now be  scheduled through MyChart.  Log into your MyChart account.  Go to 'Visit' (or 'Appointments' if  on mobile App) --> Schedule an  Appointment  Under 'Select a Reason for Visit' choose the Mammogram  Screening option.  Complete the pre-visit questions  and select the time and place that  best fits your schedule

## 2023-11-18 NOTE — Progress Notes (Signed)
 Subjective:    Patient ID: Cheryl Joseph, female    DOB: [birth year], 83 y.o.   MRN: [id number]  HPI  Here for health maintenance exam and to review chronic medical problems   Wt Readings from Last 3 Encounters:  11/18/23 202 lb 6 oz (91.8 kg)  10/27/23 204 lb (92.5 kg)  10/11/23 204 lb (92.5 kg)   36.43 kg/m  Vitals:   11/18/23 0918 11/18/23 0951  BP: (!) 142/78 135/60  Pulse: (!) 56   Temp: 98.1 F (36.7 C)   SpO2: 94%     Immunization History  Administered Date(s) Administered   Fluad Quad(high Dose 65+) 04/26/2019, 05/01/2020, 04/23/2022   Fluad Trivalent(High Dose 65+) 05/09/2023   Influenza Split 05/27/2011, 05/01/2012   Influenza Whole 04/27/2007, 05/23/2008, 05/14/2009, 05/05/2010   Influenza,inj,Quad PF,6+ Mos 05/21/2013, 04/26/2014, 05/09/2015, 05/19/2016, 04/26/2017, 05/18/2018   Influenza,inj,quad, With Preservative 04/26/2016   PFIZER(Purple Top)SARS-COV-2 Vaccination 09/24/2019, 10/15/2019, 05/14/2020   Pneumococcal Conjugate-13 08/19/2014   Pneumococcal Polysaccharide-23 05/31/2005   Pneumococcal-Unspecified 04/28/2015   Td 11/24/1998, 05/14/2009   Zoster Recombinant(Shingrix) 10/25/2022, 01/10/2023   Zoster, Live 03/05/2008    There are no preventive care reminders to display for this patient.   Mammogram 12/2022 Self breast exam-no lumps   Gyn health Has prolapse No changes     Bone health  Dexa  12/2020 osteopenia  Falls- fell over rolled up rug taking xmas tree out , no injury  Fractures-none  Supplements 2000iu D3 daily plus mvi Last vitamin D  Lab Results  Component Value Date   VD25OH 29.52 (L) 08/14/2014    Exercise  Walk 30 min and exercise bike 20 minute   Her right leg is weaker from stroke in past-struggled with this     Mood    11/18/2023    9:26 AM 10/27/2023    9:40 AM 10/11/2023    3:00 PM 07/22/2023   11:10 AM 02/16/2023    8:26 AM  Depression screen PHQ 2/9  Decreased Interest 0 0 0 0 0  Down, Depressed,  Hopeless 0 0 0 0 0  PHQ - 2 Score 0 0 0 0 0  Altered sleeping 0  0 0 0  Tired, decreased energy 0  0 0 1  Change in appetite 0  0 0 0  Feeling bad or failure about yourself  0  0 0 0  Trouble concentrating 0  0 0 0  Moving slowly or fidgety/restless 0  0 0 0  Suicidal thoughts 0  0 0 0  PHQ-9 Score 0  0 0 1  Difficult doing work/chores Not difficult at all  Not difficult at all Not difficult at all Somewhat difficult   HTN bp is stable today  No cp or palpitations or headaches or edema  No side effects to medicines  BP Readings from Last 3 Encounters:  11/18/23 135/60  10/11/23 134/72  08/26/23 136/74    Amlodipine  5 mg daily   History of CVA  Lab Results  Component Value Date   NA 142 11/03/2023   K 4.1 11/03/2023   CO2 28 11/03/2023   GLUCOSE 129 (H) 11/03/2023   BUN 20 11/03/2023   CREATININE 0.82 11/03/2023   CALCIUM  8.9 11/03/2023   GFR 65.97 11/03/2023   GFRNONAA >60 11/22/2022   Lab Results  Component Value Date   ALT 11 11/03/2023   AST 17 11/03/2023   ALKPHOS 83 11/03/2023   BILITOT 0.7 11/03/2023   Lab Results  Component Value Date   WBC  5.8 11/03/2023   HGB 14.9 11/03/2023   HCT 44.6 11/03/2023   MCV 96.0 11/03/2023   PLT 243.0 11/03/2023    Hyperlipidemia Lab Results  Component Value Date   CHOL 176 11/03/2023   CHOL 168 10/29/2022   CHOL 160 04/23/2022   Lab Results  Component Value Date   HDL 48.30 11/03/2023   HDL 47.50 10/29/2022   HDL 42.60 04/23/2022   Lab Results  Component Value Date   LDLCALC 86 11/03/2023   LDLCALC 88 10/29/2022   LDLCALC 88 04/23/2022   Lab Results  Component Value Date   TRIG 212.0 (H) 11/03/2023   TRIG 165.0 (H) 10/29/2022   TRIG 147.0 04/23/2022   Lab Results  Component Value Date   CHOLHDL 4 11/03/2023   CHOLHDL 4 10/29/2022   CHOLHDL 4 04/23/2022   Lab Results  Component Value Date   LDLDIRECT 135.0 02/25/2022   LDLDIRECT 97.0 07/06/2019   LDLDIRECT 124.0 07/31/2018   Simvastatin  40  mg , rosovastain 20 mg every other day   Prediabetes Lab Results  Component Value Date   HGBA1C 6.3 11/03/2023   HGBA1C 6.2 10/29/2022   HGBA1C 6.4 02/25/2022     Patient Active Problem List   Diagnosis Date Noted   Rash of neck 10/11/2023   Pelvic prolapse 07/22/2023   Pain of right thumb 02/16/2023   Encounter for screening mammogram for breast cancer 12/28/2022   Cerumen impaction 12/28/2022   Chronic headache 02/25/2022   Snoring 02/25/2022   History of stroke 12/31/2021   Umbilical hernia 01/05/2021   Pre-operative general physical examination 07/06/2019   Estrogen deficiency 05/25/2016   Routine general medical examination at a health care facility 11/23/2015   Hammertoe 10/28/2015   Diverticulosis of colon without hemorrhage 02/14/2015   Colon cancer screening 08/19/2014   Essential hypertension 02/25/2014   Encounter for Medicare annual wellness exam 07/16/2013   Prediabetes 05/14/2009   Osteopenia 03/07/2007   Hyperlipidemia 01/03/2007   Obesity (BMI 30-39.9) 01/03/2007   ALLERGIC RHINITIS, SEASONAL 01/03/2007   Asthma 01/03/2007   OVERACTIVE BLADDER 01/03/2007   Past Medical History:  Diagnosis Date   Anginal pain (HCC)    Arthritis    RA hands(seronegative)   Asthma    Back pain    Cancer (HCC)    skin   Cataract    HPV in female 1996   HPV with colposcopy (all neg paps since)   Hyperlipidemia    Hypertension    Incarcerated ventral hernia    Obesity    Osteopenia    Pre-diabetes    Shortness of breath dyspnea    Shoulder pain    Past Surgical History:  Procedure Laterality Date   CATARACT EXTRACTION, BILATERAL Bilateral    COLONOSCOPY  06/01/2004   COLONOSCOPY WITH PROPOFOL  N/A 05/01/2015   Procedure: COLONOSCOPY WITH PROPOFOL ;  Surgeon: Cassie Click, MD;  Location: [facility] END[facility]COPY;  Service: Endoscopy;  Laterality: N/A;   CORRECTION HAMMER TOE Right    2nd toe   JOINT REPLACEMENT Right    right knee replacement January 2021    SQUAMOUS CELL CARCINOMA EXCISION Left 12/2017   left arm   TONSILLECTOMY     VAGINAL HYSTERECTOMY  2015   with pelvic organ prolapse surgery    XI ROBOTIC ASSISTED VENTRAL HERNIA N/A 06/10/2021   Procedure: XI ROBOTIC ASSISTED VENTRAL HERNIA;  Surgeon: Eldred Grego, MD;  Location: [facility] ORS;  Service: General;  Laterality: N/A;   Social History   Tobacco Use  Smoking status: Never   Smokeless tobacco: Never  Vaping Use   Vaping status: Never Used  Substance Use Topics   Alcohol use: No    Alcohol/week: 0.0 standard drinks of alcohol   Drug use: No   Family History  Problem Relation Age of Onset   Diabetes Mother    Hyperlipidemia Mother    Heart disease Mother    Hypertension Mother    Obesity Mother    Kyphosis Mother    Heart disease Father    Hypertension Father    Cancer Father        lung Cancer smoker   Diabetes Daughter    Kyphosis Sister    Breast cancer Neg Hx    Allergies  Allergen Reactions   Atorvastatin      Headache    Tolterodine Tartrate Other (See Comments)    Unknown (back ache)   Vesicare [Solifenacin Succinate]     Eye problems    Zetia  [Ezetimibe ]     Headache    Current Outpatient Medications on File Prior to Visit  Medication Sig Dispense Refill   acetaminophen  (TYLENOL ) 500 MG tablet Take 1,000 mg by mouth every 6 (six) hours as needed for mild pain.     aspirin  EC 81 MG tablet Take 1 tablet (81 mg total) by mouth daily. Swallow whole.     b complex vitamins tablet Take 1 tablet by mouth every Monday, Wednesday, and Friday.     benzonatate  (TESSALON ) 200 MG capsule Take 1 capsule (200 mg total) by mouth 3 (three) times daily as needed for cough. Swallow whole 30 capsule 1   Cholecalciferol (VITAMIN D3 PO) Take 2 tablets by mouth daily.     Hyaluronic Acid-Vitamin C (HYALURONIC ACID PO) Take 1 tablet by mouth every Monday, Wednesday, and Friday.     Multiple Vitamin (MULTIVITAMIN) capsule Take 1 capsule by mouth daily. Reported  on 12/18/2015     Omega-3 Fatty Acids (FISH OIL PO) Take 1 capsule by mouth daily. Reported on 12/18/2015     OVER THE COUNTER MEDICATION Take 2 capsules by mouth daily. BONE UP     oxybutynin  (OXYTROL ) 3.9 MG/24HR Place 1 patch onto the skin once a week. Sunday.  Home med.     Probiotic Product (PROBIOTIC DAILY PO) Take 1 tablet by mouth daily.     Pumpkin Seed-Soy Germ (AZO BLADDER CONTROL/GO-LESS PO) Take 1 capsule by mouth at bedtime.     triamcinolone  cream (KENALOG ) 0.1 % Apply 1 Application topically 2 (two) times daily. To affected (rash) area 30 g 0   TURMERIC PO Take 720 mg by mouth every Monday, Wednesday, and Friday.     Ubiquinol 100 MG CAPS Take 1 capsule by mouth every Monday, Wednesday, and Friday.     No current facility-administered medications on file prior to visit.    Review of Systems  Constitutional:  Negative for activity change, appetite change, fatigue, fever and unexpected weight change.  HENT:  Negative for congestion, ear pain, rhinorrhea, sinus pressure and sore throat.   Eyes:  Negative for pain, redness and visual disturbance.  Respiratory:  Negative for cough, shortness of breath and wheezing.   Cardiovascular:  Negative for chest pain and palpitations.  Gastrointestinal:  Negative for abdominal pain, blood in stool, constipation and diarrhea.  Endocrine: Negative for polydipsia and polyuria.  Genitourinary:  Negative for dysuria, frequency and urgency.  Musculoskeletal:  Positive for arthralgias. Negative for back pain and myalgias.  Skin:  Negative for pallor and  rash.  Allergic/Immunologic: Negative for environmental allergies.  Neurological:  Negative for dizziness, syncope and headaches.  Hematological:  Negative for adenopathy. Does not bruise/bleed easily.  Psychiatric/Behavioral:  Negative for decreased concentration and dysphoric mood. The patient is not nervous/anxious.        Objective:   Physical Exam Constitutional:      General: She is  not in acute distress.    Appearance: Normal appearance. She is well-developed. She is obese. She is not ill-appearing or diaphoretic.  HENT:     Head: Normocephalic and atraumatic.     Right Ear: Tympanic membrane, ear canal and external ear normal.     Left Ear: Tympanic membrane, ear canal and external ear normal.     Nose: Nose normal. No congestion.     Mouth/Throat:     Mouth: Mucous membranes are moist.     Pharynx: Oropharynx is clear. No posterior oropharyngeal erythema.  Eyes:     General: No scleral icterus.    Extraocular Movements: Extraocular movements intact.     Conjunctiva/sclera: Conjunctivae normal.     Pupils: Pupils are equal, round, and reactive to light.  Neck:     Thyroid : No thyromegaly.     Vascular: No carotid bruit or JVD.  Cardiovascular:     Rate and Rhythm: Normal rate and regular rhythm.     Pulses: Normal pulses.     Heart sounds: Normal heart sounds.     No gallop.  Pulmonary:     Effort: Pulmonary effort is normal. No respiratory distress.     Breath sounds: Normal breath sounds. No wheezing.     Comments: Good air exch Chest:     Chest wall: No tenderness.  Abdominal:     General: Bowel sounds are normal. There is no distension or abdominal bruit.     Palpations: Abdomen is soft. There is no mass.     Tenderness: There is no abdominal tenderness.     Hernia: No hernia is present.  Genitourinary:    Comments: Breast exam: No mass, nodules, thickening, tenderness, bulging, retraction, inflamation, nipple discharge or skin changes noted.  No axillary or clavicular LA.     Musculoskeletal:        General: No tenderness. Normal range of motion.     Cervical back: Normal range of motion and neck supple. No rigidity. No muscular tenderness.     Right lower leg: No edema.     Left lower leg: No edema.     Comments: No kyphosis   Lymphadenopathy:     Cervical: No cervical adenopathy.  Skin:    General: Skin is warm and dry.     Coloration: Skin  is not pale.     Findings: No erythema or rash.     Comments: Solar lentigines diffusely Some sks   Neurological:     Mental Status: She is alert. Mental status is at baseline.     Cranial Nerves: No cranial nerve deficit.     Motor: No abnormal muscle tone.     Coordination: Coordination normal.     Gait: Gait normal.     Deep Tendon Reflexes: Reflexes are normal and symmetric. Reflexes normal.  Psychiatric:        Mood and Affect: Mood normal.        Cognition and Memory: Cognition and memory normal.           Assessment & Plan:   Problem List Items Addressed This Visit  Cardiovascular and Mediastinum   Essential hypertension   bp in fair control at this time  BP Readings from Last 1 Encounters:  11/18/23 135/60   No changes needed Most recent labs reviewed  Disc lifstyle change with low sodium diet and exercise  Controlled by amlodipine  5 mg daily in setting of past CVA      Relevant Medications   amLODipine  (NORVASC ) 5 MG tablet   rosuvastatin  (CRESTOR ) 20 MG tablet   simvastatin  (ZOCOR ) 40 MG tablet     Musculoskeletal and Integument   RESOLVED: Rheumatoid arthritis with positive rheumatoid factor (HCC)   Per pt - was not actually dx with RA/ was questionable from rheumatologist  Lab Results  Component Value Date   ANA NEG 06/13/2007   RF 29.3 (H) 06/13/2007         Osteopenia   Dexa ordered  No falls or fractures  Discussed fall prevention, supplements and exercise for bone density          Other   Routine general medical examination at a health care facility - Primary   Reviewed health habits including diet and exercise and skin cancer prevention Reviewed appropriate screening tests for age  Also reviewed health mt list, fam hx and immunization status , as well as social and family history   See HPI Labs reviewed and ordered Health Maintenance  Topic Date Due   COVID-19 Vaccine (4 - 2024-25 season) 10/26/2024*   Yearly kidney health  urinalysis for diabetes  11/07/2027*   Mammogram  01/10/2024   Flu Shot  02/24/2024   Hemoglobin A1C  05/04/2024   Medicare Annual Wellness Visit  10/26/2024   Yearly kidney function blood test for diabetes  11/02/2024   Pneumonia Vaccine  Completed   DEXA scan (bone density measurement)  Completed   Zoster (Shingles) Vaccine  Completed   HPV Vaccine  Aged Out   Meningitis B Vaccine  Aged Out   DTaP/Tdap/Td vaccine  Discontinued   Complete foot exam   Discontinued   Eye exam for diabetics  Discontinued  *Topic was postponed. The date shown is not the original due date.    Mammogram and dexa ordered  Discussed fall prevention, supplements and exercise for bone density  PHQ 0       Prediabetes   Lab Results  Component Value Date   HGBA1C 6.3 11/03/2023   HGBA1C 6.2 10/29/2022   HGBA1C 6.4 02/25/2022   disc imp of low glycemic diet and wt loss to prevent DM2       Obesity (BMI 30-39.9)   Discussed how this problem influences overall health and the risks it imposes  Reviewed plan for weight loss with lower calorie diet (via better food choices (lower glycemic and portion control) along with exercise building up to or more than 30 minutes 5 days per week including some aerobic activity and strength training         Hyperlipidemia   Disc goals for lipids and reasons to control them Rev last labs with pt Rev low sat fat diet in detail  She is able to tolerate simvastatin  40 every other day alt with rosuvastatin  20  Plans to discuss further with cardiology LDL is 86 H/o past cva so goal is 70 or below  Eating well        Relevant Medications   amLODipine  (NORVASC ) 5 MG tablet   rosuvastatin  (CRESTOR ) 20 MG tablet   simvastatin  (ZOCOR ) 40 MG tablet   Estrogen deficiency   Dexa  ordered       Relevant Orders   DG Bone Density   Encounter for screening mammogram for breast cancer   Mammogram ordered  Pt will call to schedul       Relevant Orders   MM 3D  SCREENING MAMMOGRAM BILATERAL BREAST

## 2023-11-20 NOTE — Assessment & Plan Note (Signed)
 Dexa ordered  No falls or fractures  Discussed fall prevention, supplements and exercise for bone density

## 2023-11-20 NOTE — Assessment & Plan Note (Signed)
 Mammogram ordered  Pt will call to schedul

## 2023-11-20 NOTE — Assessment & Plan Note (Signed)
 Reviewed health habits including diet and exercise and skin cancer prevention Reviewed appropriate screening tests for age  Also reviewed health mt list, fam hx and immunization status , as well as social and family history   See HPI Labs reviewed and ordered Health Maintenance  Topic Date Due   COVID-19 Vaccine (4 - 2024-25 season) 10/26/2024*   Yearly kidney health urinalysis for diabetes  11/07/2027*   Mammogram  01/10/2024   Flu Shot  02/24/2024   Hemoglobin A1C  05/04/2024   Medicare Annual Wellness Visit  10/26/2024   Yearly kidney function blood test for diabetes  11/02/2024   Pneumonia Vaccine  Completed   DEXA scan (bone density measurement)  Completed   Zoster (Shingles) Vaccine  Completed   HPV Vaccine  Aged Out   Meningitis B Vaccine  Aged Out   DTaP/Tdap/Td vaccine  Discontinued   Complete foot exam   Discontinued   Eye exam for diabetics  Discontinued  *Topic was postponed. The date shown is not the original due date.    Mammogram and dexa ordered  Discussed fall prevention, supplements and exercise for bone density  PHQ 0

## 2023-11-20 NOTE — Assessment & Plan Note (Signed)
 Dexa ordered

## 2023-11-20 NOTE — Assessment & Plan Note (Signed)
 Disc goals for lipids and reasons to control them Rev last labs with pt Rev low sat fat diet in detail  She is able to tolerate simvastatin  40 every other day alt with rosuvastatin  20  Plans to discuss further with cardiology LDL is 86 H/o past cva so goal is 70 or below  Eating well

## 2023-11-20 NOTE — Assessment & Plan Note (Signed)
 Lab Results  Component Value Date   HGBA1C 6.3 11/03/2023   HGBA1C 6.2 10/29/2022   HGBA1C 6.4 02/25/2022   disc imp of low glycemic diet and wt loss to prevent DM2

## 2023-11-20 NOTE — Assessment & Plan Note (Signed)
 Discussed how this problem influences overall health and the risks it imposes  Reviewed plan for weight loss with lower calorie diet (via better food choices (lower glycemic and portion control) along with exercise building up to or more than 30 minutes 5 days per week including some aerobic activity and strength training

## 2023-11-20 NOTE — Assessment & Plan Note (Signed)
Per pt - was not actually dx with RA/ was questionable from rheumatologist  Lab Results  Component Value Date   ANA NEG 06/13/2007   RF 29.3 (H) 06/13/2007

## 2023-11-20 NOTE — Assessment & Plan Note (Signed)
 bp in fair control at this time  BP Readings from Last 1 Encounters:  11/18/23 135/60   No changes needed Most recent labs reviewed  Disc lifstyle change with low sodium diet and exercise  Controlled by amlodipine  5 mg daily in setting of past CVA

## 2023-11-28 DIAGNOSIS — R269 Unspecified abnormalities of gait and mobility: Secondary | ICD-10-CM | POA: Diagnosis not present

## 2023-11-28 DIAGNOSIS — S76311D Strain of muscle, fascia and tendon of the posterior muscle group at thigh level, right thigh, subsequent encounter: Secondary | ICD-10-CM | POA: Diagnosis not present

## 2023-12-07 DIAGNOSIS — S76311D Strain of muscle, fascia and tendon of the posterior muscle group at thigh level, right thigh, subsequent encounter: Secondary | ICD-10-CM | POA: Diagnosis not present

## 2023-12-07 DIAGNOSIS — R269 Unspecified abnormalities of gait and mobility: Secondary | ICD-10-CM | POA: Diagnosis not present

## 2023-12-09 DIAGNOSIS — S76311D Strain of muscle, fascia and tendon of the posterior muscle group at thigh level, right thigh, subsequent encounter: Secondary | ICD-10-CM | POA: Diagnosis not present

## 2023-12-09 DIAGNOSIS — R269 Unspecified abnormalities of gait and mobility: Secondary | ICD-10-CM | POA: Diagnosis not present

## 2023-12-12 DIAGNOSIS — R269 Unspecified abnormalities of gait and mobility: Secondary | ICD-10-CM | POA: Diagnosis not present

## 2023-12-12 DIAGNOSIS — S76311D Strain of muscle, fascia and tendon of the posterior muscle group at thigh level, right thigh, subsequent encounter: Secondary | ICD-10-CM | POA: Diagnosis not present

## 2023-12-15 DIAGNOSIS — R269 Unspecified abnormalities of gait and mobility: Secondary | ICD-10-CM | POA: Diagnosis not present

## 2023-12-15 DIAGNOSIS — S76311D Strain of muscle, fascia and tendon of the posterior muscle group at thigh level, right thigh, subsequent encounter: Secondary | ICD-10-CM | POA: Diagnosis not present

## 2023-12-21 DIAGNOSIS — R269 Unspecified abnormalities of gait and mobility: Secondary | ICD-10-CM | POA: Diagnosis not present

## 2023-12-21 DIAGNOSIS — S76311D Strain of muscle, fascia and tendon of the posterior muscle group at thigh level, right thigh, subsequent encounter: Secondary | ICD-10-CM | POA: Diagnosis not present

## 2023-12-23 DIAGNOSIS — S76311D Strain of muscle, fascia and tendon of the posterior muscle group at thigh level, right thigh, subsequent encounter: Secondary | ICD-10-CM | POA: Diagnosis not present

## 2023-12-23 DIAGNOSIS — R269 Unspecified abnormalities of gait and mobility: Secondary | ICD-10-CM | POA: Diagnosis not present

## 2023-12-26 DIAGNOSIS — R269 Unspecified abnormalities of gait and mobility: Secondary | ICD-10-CM | POA: Diagnosis not present

## 2023-12-26 DIAGNOSIS — H26493 Other secondary cataract, bilateral: Secondary | ICD-10-CM | POA: Diagnosis not present

## 2023-12-26 DIAGNOSIS — Z0100 Encounter for examination of eyes and vision without abnormal findings: Secondary | ICD-10-CM | POA: Diagnosis not present

## 2023-12-26 DIAGNOSIS — S76311D Strain of muscle, fascia and tendon of the posterior muscle group at thigh level, right thigh, subsequent encounter: Secondary | ICD-10-CM | POA: Diagnosis not present

## 2023-12-26 DIAGNOSIS — M3501 Sicca syndrome with keratoconjunctivitis: Secondary | ICD-10-CM | POA: Diagnosis not present

## 2023-12-26 DIAGNOSIS — H43813 Vitreous degeneration, bilateral: Secondary | ICD-10-CM | POA: Diagnosis not present

## 2024-01-02 DIAGNOSIS — S76311D Strain of muscle, fascia and tendon of the posterior muscle group at thigh level, right thigh, subsequent encounter: Secondary | ICD-10-CM | POA: Diagnosis not present

## 2024-01-02 DIAGNOSIS — R269 Unspecified abnormalities of gait and mobility: Secondary | ICD-10-CM | POA: Diagnosis not present

## 2024-01-04 DIAGNOSIS — Z713 Dietary counseling and surveillance: Secondary | ICD-10-CM | POA: Diagnosis not present

## 2024-01-04 DIAGNOSIS — H524 Presbyopia: Secondary | ICD-10-CM | POA: Diagnosis not present

## 2024-01-04 DIAGNOSIS — R4189 Other symptoms and signs involving cognitive functions and awareness: Secondary | ICD-10-CM | POA: Diagnosis not present

## 2024-01-04 DIAGNOSIS — R4789 Other speech disturbances: Secondary | ICD-10-CM | POA: Diagnosis not present

## 2024-01-04 DIAGNOSIS — Z8673 Personal history of transient ischemic attack (TIA), and cerebral infarction without residual deficits: Secondary | ICD-10-CM | POA: Diagnosis not present

## 2024-01-04 DIAGNOSIS — Z1331 Encounter for screening for depression: Secondary | ICD-10-CM | POA: Diagnosis not present

## 2024-01-09 DIAGNOSIS — R269 Unspecified abnormalities of gait and mobility: Secondary | ICD-10-CM | POA: Diagnosis not present

## 2024-01-09 DIAGNOSIS — S76311D Strain of muscle, fascia and tendon of the posterior muscle group at thigh level, right thigh, subsequent encounter: Secondary | ICD-10-CM | POA: Diagnosis not present

## 2024-01-11 ENCOUNTER — Other Ambulatory Visit: Payer: Self-pay | Admitting: Family Medicine

## 2024-01-11 NOTE — Telephone Encounter (Signed)
 Copied from CRM [id number]. Topic: Clinical - Medication Refill >> Jan 11, 2024  9:17 AM Trula Gable C wrote: Medication: rosuvastatin  (CRESTOR ) 20 MG tablet montelukast  (SINGULAIR ) 10 MG tablet  Has the patient contacted their pharmacy? Yes (Agent: If no, request that the patient contact the pharmacy for the refill. If patient does not wish to contact the pharmacy document the reason why and proceed with request.) (Agent: If yes, when and what did the pharmacy advise?)  This is the patient's preferred pharmacy:  [facility] [address] [place] - [address] [place] [address] Phone: [phone] Fax: [phone]  Is this the correct pharmacy for this prescription? Yes If no, delete pharmacy and type the correct one.   Has the prescription been filled recently? No  Is the patient out of the medication? Yes  Has the patient been seen for an appointment in the last year OR does the patient have an upcoming appointment? Yes  Can we respond through MyChart? Yes  Agent: Please be advised that Rx refills may take up to 3 business days. We ask that you follow-up with your pharmacy.

## 2024-01-11 NOTE — Telephone Encounter (Signed)
 Meds were refilled on 11/18/23, just needs to request from pharmacy

## 2024-01-13 DIAGNOSIS — E669 Obesity, unspecified: Secondary | ICD-10-CM | POA: Diagnosis not present

## 2024-01-13 DIAGNOSIS — Z8673 Personal history of transient ischemic attack (TIA), and cerebral infarction without residual deficits: Secondary | ICD-10-CM | POA: Diagnosis not present

## 2024-01-13 DIAGNOSIS — J4520 Mild intermittent asthma, uncomplicated: Secondary | ICD-10-CM | POA: Diagnosis not present

## 2024-01-13 DIAGNOSIS — I10 Essential (primary) hypertension: Secondary | ICD-10-CM | POA: Diagnosis not present

## 2024-01-13 DIAGNOSIS — R0602 Shortness of breath: Secondary | ICD-10-CM | POA: Diagnosis not present

## 2024-01-13 DIAGNOSIS — R600 Localized edema: Secondary | ICD-10-CM | POA: Diagnosis not present

## 2024-01-13 DIAGNOSIS — E782 Mixed hyperlipidemia: Secondary | ICD-10-CM | POA: Diagnosis not present

## 2024-01-30 ENCOUNTER — Ambulatory Visit: Payer: Self-pay | Admitting: *Deleted

## 2024-01-30 ENCOUNTER — Ambulatory Visit: Payer: Self-pay

## 2024-01-30 NOTE — Telephone Encounter (Signed)
 1st attempt, LVM     Patient has had diarrhea for the last 2 weeks

## 2024-01-30 NOTE — Telephone Encounter (Signed)
 No appts today. Appt scheduled with Dr. Jimmy tomorrow, FYI to PCP and Dr. Letvak

## 2024-01-30 NOTE — Telephone Encounter (Signed)
 Noted---will check her in the morning

## 2024-01-30 NOTE — Telephone Encounter (Signed)
 Noted I will check her in the morning

## 2024-01-30 NOTE — Telephone Encounter (Signed)
 FYI Only or Action Required?: FYI only for provider.  Patient was last seen in primary care on 11/18/2023 by Randeen Laine LABOR, MD.  Called Nurse Triage reporting Appointment.  Symptoms began several weeks ago.  Interventions attempted: OTC medications: Imodium .  Symptoms are: unchanged.  Triage Disposition: Information or Advice Only Call  Patient/caregiver understands and will follow disposition?:  yes  Reason for Disposition  General information question, no triage required and triager able to answer question  Answer Assessment - Initial Assessment Questions 1. REASON FOR CALL or QUESTION: What is your reason for calling today? or How can I best help you? or What question do you have that I can help answer?     Patient was calling to see if there is anyway she can be seen today- advised no open appointment. Call to CAL- no open appointment- patient notified and will keep her appointment in am.  Protocols used: Information Only Call - No Triage-A-AH   Copied from CRM [id number]. Topic: Clinical - Pink Word Triage >> Jan 30, 2024  8:29 AM Cheryl Joseph wrote: Reason for Triage: Patient has had diarrhea for the last 2 weeks >> Jan 30, 2024  2:37 PM Cheryl Joseph wrote: Patient is returning a call from NT. >> Jan 30, 2024  8:29 AM Cheryl Joseph wrote: Patient has had diarrhea for the last 2 weeks

## 2024-01-30 NOTE — Telephone Encounter (Signed)
 Aware I hope she will call back if needed

## 2024-01-30 NOTE — Telephone Encounter (Signed)
 FYI to PCP, triage nurse tried to call pt back x3 times

## 2024-01-30 NOTE — Telephone Encounter (Signed)
 FYI Only or Action Required?: FYI only for provider.  Patient was last seen in primary care on 11/18/2023 by Randeen Laine LABOR, MD.  Called Nurse Triage reporting Diarrhea, Abdominal Pain, starting to feel a little weak, and dark stool.  Symptoms began several weeks ago.  Interventions attempted: OTC medications: pedialyte, imodium, Rest, hydration, or home remedies, and Dietary changes.  Symptoms are: gradually worsening.  Triage Disposition: See Physician Within 24 Hours  Patient/caregiver understands and will follow disposition?: Yes         Copied from CRM [id number]. Topic: Clinical - Pink Word Triage >> Jan 30, 2024  8:29 AM Geroldine GRADE wrote: Reason for Triage: Patient has had diarrhea for the last 2 weeks >> Jan 30, 2024  2:37 PM Drema MATSU wrote: Patient is returning a call from NT. >> Jan 30, 2024  8:29 AM Adrionna Y wrote: Patient has had diarrhea for the last 2 weeks Reason for Disposition  [1] MODERATE diarrhea (e.g., 4-6 times / day more than normal) AND [2] present > 48 hours (2 days)  Answer Assessment - Initial Assessment Questions 1. DIARRHEA SEVERITY: How bad is the diarrhea? How many more stools have you had in the past 24 hours than normal?    - NO DIARRHEA (SCALE 0)   - MILD (SCALE 1-3): Few loose or mushy BMs; increase of 1-3 stools over normal daily number of stools; mild increase in ostomy output.   -  MODERATE (SCALE 4-7): Increase of 4-6 stools daily over normal; moderate increase in ostomy output.   -  SEVERE (SCALE 8-10; OR WORST POSSIBLE): Increase of 7 or more stools daily over normal; moderate increase in ostomy output; incontinence.     3-4x mostly in the morning, took 2 imodium today but afraid to take imodium everyday, have appts every day but Wednesday, will have diarrhea 1x a week as her normal but this is just ridiculous 2. ONSET: When did the diarrhea begin?      2 weeks 3. BM CONSISTENCY: How loose or watery is the diarrhea?      Not  watery, started out really loose little more form to it now, looked like jelly, very dark stool 4. VOMITING: Are you also vomiting? If Yes, ask: How many times in the past 24 hours?      No or nausea 5. ABDOMEN PAIN: Are you having any abdomen pain? If Yes, ask: What does it feel like? (e.g., crampy, dull, intermittent, constant)      Left side hurts but hurt before diarrhea started, think maybe scar tissue had abdominal double hernia surgery, just hurts every once in a while hurt 30 min today 6. ABDOMEN PAIN SEVERITY: If present, ask: How bad is the pain?  (e.g., Scale 1-10; mild, moderate, or severe)   - MILD (1-3): doesn't interfere with normal activities, abdomen soft and not tender to touch    - MODERATE (4-7): interferes with normal activities or awakens from sleep, abdomen tender to touch    - SEVERE (8-10): excruciating pain, doubled over, unable to do any normal activities       5/10 7. ORAL INTAKE: If vomiting, Have you been able to drink liquids? How much liquids have you had in the past 24 hours?     Not watered down the pedialyte, propel powder into water 8. HYDRATION: Any signs of dehydration? (e.g., dry mouth [not just dry lips], too weak to stand, dizziness, new weight loss) When did you last urinate?  Starting to feel a little weak, can tell I'm weak, not eating as much, doing some soup and just had a frosty, crackers 9. EXPOSURE: Have you traveled to a foreign country recently? Have you been exposed to anyone with diarrhea? Could you have eaten any food that was spoiled?     no 10. ANTIBIOTIC USE: Are you taking antibiotics now or have you taken antibiotics in the past 2 months?       no 11. OTHER SYMPTOMS: Do you have any other symptoms? (e.g., fever, blood in stool)       Feel like getting a little weak, drinking pedialyte, decaf tea, propel, metamucil 1-2x/day Took 3 imodium yesterday Not seen any blood No fever, every once in a while a chill  will come over me Peeing consistently, going so much Started taking melatonin only new med, don't know if relaxes my bladder or what Dry mouth but have that usually just in morning, try to keep drinking Have dizziness all the time period, at my age, don't have balance, no wooziness or lightheadedness, I feel like I stagger across large distances like from parking lot to [employer]    Advised water down the propel and pedialyte, advised eat bland foods like bread/toast/rice/apples, advised avoid further imodium at this time, advised exam in next 24 hours, earlier if possible, scheduled earliest available with PCP office, advised call back if new or worsening symptoms or seek immediate care  Protocols used: [facility]

## 2024-01-30 NOTE — Telephone Encounter (Signed)
 3rd call attempt for diarrhea symptoms.

## 2024-01-31 ENCOUNTER — Ambulatory Visit (INDEPENDENT_AMBULATORY_CARE_PROVIDER_SITE_OTHER)
Admission: RE | Admit: 2024-01-31 | Discharge: 2024-01-31 | Disposition: A | Discharge status: Home / Self Care | Source: Ambulatory Visit | Attending: Internal Medicine | Admitting: Internal Medicine

## 2024-01-31 ENCOUNTER — Ambulatory Visit
Admission: RE | Admit: 2024-01-31 | Discharge: 2024-01-31 | Disposition: A | Discharge status: Home / Self Care | Source: Ambulatory Visit | Attending: Family Medicine | Admitting: Family Medicine

## 2024-01-31 ENCOUNTER — Ambulatory Visit (INDEPENDENT_AMBULATORY_CARE_PROVIDER_SITE_OTHER): Admitting: Internal Medicine

## 2024-01-31 ENCOUNTER — Encounter: Payer: Self-pay | Admitting: Internal Medicine

## 2024-01-31 VITALS — BP 124/76 | HR 69 | Temp 98.3°F | Ht 62.5 in | Wt 200.4 lb

## 2024-01-31 DIAGNOSIS — Z780 Asymptomatic menopausal state: Secondary | ICD-10-CM | POA: Diagnosis not present

## 2024-01-31 DIAGNOSIS — Z1231 Encounter for screening mammogram for malignant neoplasm of breast: Secondary | ICD-10-CM | POA: Diagnosis not present

## 2024-01-31 DIAGNOSIS — E2839 Other primary ovarian failure: Secondary | ICD-10-CM | POA: Insufficient documentation

## 2024-01-31 DIAGNOSIS — R197 Diarrhea, unspecified: Secondary | ICD-10-CM

## 2024-01-31 DIAGNOSIS — M8589 Other specified disorders of bone density and structure, multiple sites: Secondary | ICD-10-CM | POA: Diagnosis not present

## 2024-01-31 DIAGNOSIS — I878 Other specified disorders of veins: Secondary | ICD-10-CM | POA: Diagnosis not present

## 2024-01-31 NOTE — Progress Notes (Signed)
 Subjective:    Patient ID: Cheryl Joseph, female    DOB: [birth year], 83 y.o.   MRN: [id number]  HPI Here due to diarrhea  Started about 3 weeks ago---with diarrhea All day long and lasted for days Then did improve--and now still loose stools every morning (up to 3) No obvious tainted food No ill exposures  Did start taking metamucil---now has form in the stool No fecal incontience Taking pedialyte for rehydration/propel Today is the first morning she didn't start with diarrhea---might be due to taking imodium Taking imodium regularly--but not every day (2 and then 1 later if going out)  Doesn't feel sick Eating okay--being careful  Current Outpatient Medications on File Prior to Visit  Medication Sig Dispense Refill   acetaminophen  (TYLENOL ) 500 MG tablet Take 1,000 mg by mouth every 6 (six) hours as needed for mild pain.     albuterol  (VENTOLIN  HFA) 108 (90 Base) MCG/ACT inhaler INHALE TWO PUFFS BY MOUTH EVERY 4 HOURS AS NEEDED FOR WHEEZING AND 2 PUFFS BEFORE EXERCISE OR EXPOSURE TO COLD AIR 18 each 3   amLODipine  (NORVASC ) 5 MG tablet Take 1 tablet (5 mg total) by mouth daily. 90 tablet 3   aspirin  EC 81 MG tablet Take 1 tablet (81 mg total) by mouth daily. Swallow whole.     b complex vitamins tablet Take 1 tablet by mouth every Monday, Wednesday, and Friday.     benzonatate  (TESSALON ) 200 MG capsule Take 1 capsule (200 mg total) by mouth 3 (three) times daily as needed for cough. Swallow whole 30 capsule 1   celecoxib (CELEBREX) 100 MG capsule Take 1 capsule by mouth daily as needed.     Cholecalciferol (VITAMIN D3 PO) Take 2 tablets by mouth daily.     Hyaluronic Acid-Vitamin C (HYALURONIC ACID PO) Take 1 tablet by mouth every Monday, Wednesday, and Friday.     Melatonin 1 MG CAPS Take by mouth.     melatonin 1 MG TABS tablet Take 1 mg by mouth.     meloxicam (MOBIC) 7.5 MG tablet Take 1 tablet by mouth daily.     montelukast  (SINGULAIR ) 10 MG tablet Take 1 tablet (10  mg total) by mouth at bedtime. 90 tablet 3   Multiple Vitamin (MULTIVITAMIN) capsule Take 1 capsule by mouth daily. Reported on 12/18/2015     Omega-3 Fatty Acids (FISH OIL PO) Take 1 capsule by mouth daily. Reported on 12/18/2015     OVER THE COUNTER MEDICATION Take 2 capsules by mouth daily. BONE UP     oxybutynin  (OXYTROL ) 3.9 MG/24HR Place 1 patch onto the skin once a week. Sunday.  Home med.     Probiotic Product (PROBIOTIC DAILY PO) Take 1 tablet by mouth daily.     Pumpkin Seed-Soy Germ (AZO BLADDER CONTROL/GO-LESS PO) Take 1 capsule by mouth at bedtime.     rosuvastatin  (CRESTOR ) 20 MG tablet Take 1 tablet (20 mg total) by mouth every other day. Takes 3 days per week 45 tablet 3   simvastatin  (ZOCOR ) 40 MG tablet Take 1 tablet (40 mg total) by mouth every other day. 45 tablet 3   triamcinolone  cream (KENALOG ) 0.1 % Apply 1 Application topically 2 (two) times daily. To affected (rash) area 30 g 0   TURMERIC PO Take 720 mg by mouth every Monday, Wednesday, and Friday.     Ubiquinol 100 MG CAPS Take 1 capsule by mouth every Monday, Wednesday, and Friday.     No current facility-administered medications on file  prior to visit.    Allergies  Allergen Reactions   Atorvastatin      Headache    Atorvastatin  Calcium  Other (See Comments)    atorvastatin  calcium    Solifenacin Succinate Other (See Comments)    solifenacin   Tolterodine Tartrate Other (See Comments)    tolterodine   Zetia  [Ezetimibe ]     Headache     Past Medical History:  Diagnosis Date   Anginal pain (HCC)    Arthritis    RA hands(seronegative)   Asthma    Back pain    Cancer (HCC)    skin   Cataract    HPV in female 1996   HPV with colposcopy (all neg paps since)   Hyperlipidemia    Hypertension    Incarcerated ventral hernia    Obesity    Osteopenia    Pre-diabetes    Shortness of breath dyspnea    Shoulder pain     Past Surgical History:  Procedure Laterality Date   CATARACT EXTRACTION, BILATERAL  Bilateral    COLONOSCOPY  06/01/2004   COLONOSCOPY WITH PROPOFOL  N/A 05/01/2015   Procedure: COLONOSCOPY WITH PROPOFOL ;  Surgeon: Lamar ONEIDA Holmes, MD;  Location: [facility] ENDOSCOPY;  Service: Endoscopy;  Laterality: N/A;   CORRECTION HAMMER TOE Right    2nd toe   JOINT REPLACEMENT Right    right knee replacement January 2021   SQUAMOUS CELL CARCINOMA EXCISION Left 12/2017   left arm   TONSILLECTOMY     VAGINAL HYSTERECTOMY  2015   with pelvic organ prolapse surgery    XI ROBOTIC ASSISTED VENTRAL HERNIA N/A 06/10/2021   Procedure: XI ROBOTIC ASSISTED VENTRAL HERNIA;  Surgeon: Rodolph Romano, MD;  Location: [facility] ORS;  Service: General;  Laterality: N/A;    Family History  Problem Relation Age of Onset   Diabetes Mother    Hyperlipidemia Mother    Heart disease Mother    Hypertension Mother    Obesity Mother    Kyphosis Mother    Heart disease Father    Hypertension Father    Cancer Father        lung Cancer smoker   Diabetes Daughter    Kyphosis Sister    Breast cancer Neg Hx     Social History   Socioeconomic History   Marital status: Married    Spouse name: Bennet   Number of children: 3   Years of education: Not on file   Highest education level: Not on file  Occupational History   Not on file  Tobacco Use   Smoking status: Never   Smokeless tobacco: Never  Vaping Use   Vaping status: Never Used  Substance and Sexual Activity   Alcohol use: No    Alcohol/week: 0.0 standard drinks of alcohol   Drug use: No   Sexual activity: Yes  Other Topics Concern   Not on file  Social History Narrative   Remarried.   19 grandchildren between pt and husband.   7 great grandchildren.   Social Drivers of Corporate investment banker Strain: Low Risk  (10/27/2023)   Overall Financial Resource Strain (CARDIA)    Difficulty of Paying Living Expenses: Not hard at all  Food Insecurity: No Food Insecurity (10/27/2023)   Hunger Vital Sign    Worried About Running Out of Food  in the Last Year: Never true    Ran Out of Food in the Last Year: Never true  Transportation Needs: No Transportation Needs (10/27/2023)   PRAPARE - Transportation  Lack of Transportation (Medical): No    Lack of Transportation (Non-Medical): No  Physical Activity: Sufficiently Active (10/27/2023)   Exercise Vital Sign    Days of Exercise per Week: 5 days    Minutes of Exercise per Session: 30 min  Stress: No Stress Concern Present (10/27/2023)   [employer] of Occupational Health - Occupational Stress Questionnaire    Feeling of Stress : Not at all  Social Connections: Socially Integrated (10/27/2023)   Social Connection and Isolation Panel    Frequency of Communication with Friends and Family: More than three times a week    Frequency of Social Gatherings with Friends and Family: More than three times a week    Attends Religious Services: More than 4 times per year    Active Member of [employer] or Organizations: Yes    Attends Engineer, structural: More than 4 times per year    Marital Status: Married  Catering manager Violence: Not At Risk (10/27/2023)   Humiliation, Afraid, Rape, and Kick questionnaire    Fear of Current or Ex-Partner: No    Emotionally Abused: No    Physically Abused: No    Sexually Abused: No   Review of Systems No recent antibiotics No fever Some LLQ pain--not new for her Appetite is still good Generally will go once first thing in the morning--then be done    Objective:   Physical Exam Constitutional:      Appearance: Normal appearance.  Cardiovascular:     Rate and Rhythm: Normal rate and regular rhythm.     Heart sounds: No murmur heard.    No gallop.  Pulmonary:     Effort: Pulmonary effort is normal.     Breath sounds: Normal breath sounds. No rales.  Abdominal:     General: Bowel sounds are normal. There is no distension.     Palpations: Abdomen is soft.     Tenderness: There is no abdominal tenderness. There is no guarding or rebound.   Musculoskeletal:     Cervical back: Neck supple.  Lymphadenopathy:     Cervical: No cervical adenopathy.  Neurological:     Mental Status: She is alert.            Assessment & Plan:

## 2024-01-31 NOTE — Assessment & Plan Note (Addendum)
 At first may have been infectious--but pattern goes against ongoing infection Will check KUB --since frequent imodium use  KUB shows moderate stool burden--no dilated bowel Discussed weaning off and stopping the imodium Okay to continue the metamucil for now

## 2024-02-01 ENCOUNTER — Ambulatory Visit: Payer: Self-pay | Admitting: Family Medicine

## 2024-02-01 ENCOUNTER — Ambulatory Visit: Payer: Self-pay | Admitting: Internal Medicine

## 2024-04-18 ENCOUNTER — Emergency Department

## 2024-04-18 ENCOUNTER — Emergency Department
Admission: EM | Admit: 2024-04-18 | Discharge: 2024-04-18 | Disposition: A | Discharge status: Home / Self Care | Attending: Emergency Medicine | Admitting: Emergency Medicine

## 2024-04-18 ENCOUNTER — Other Ambulatory Visit: Payer: Self-pay

## 2024-04-18 ENCOUNTER — Encounter: Payer: Self-pay | Admitting: Emergency Medicine

## 2024-04-18 DIAGNOSIS — I6782 Cerebral ischemia: Secondary | ICD-10-CM | POA: Diagnosis not present

## 2024-04-18 DIAGNOSIS — J45909 Unspecified asthma, uncomplicated: Secondary | ICD-10-CM | POA: Diagnosis not present

## 2024-04-18 DIAGNOSIS — S0990XA Unspecified injury of head, initial encounter: Secondary | ICD-10-CM | POA: Diagnosis not present

## 2024-04-18 DIAGNOSIS — S42292A Other displaced fracture of upper end of left humerus, initial encounter for closed fracture: Secondary | ICD-10-CM | POA: Diagnosis not present

## 2024-04-18 DIAGNOSIS — I672 Cerebral atherosclerosis: Secondary | ICD-10-CM | POA: Diagnosis not present

## 2024-04-18 DIAGNOSIS — Z8673 Personal history of transient ischemic attack (TIA), and cerebral infarction without residual deficits: Secondary | ICD-10-CM | POA: Insufficient documentation

## 2024-04-18 DIAGNOSIS — S42352A Displaced comminuted fracture of shaft of humerus, left arm, initial encounter for closed fracture: Secondary | ICD-10-CM | POA: Diagnosis not present

## 2024-04-18 DIAGNOSIS — Z7982 Long term (current) use of aspirin: Secondary | ICD-10-CM | POA: Insufficient documentation

## 2024-04-18 DIAGNOSIS — Z0389 Encounter for observation for other suspected diseases and conditions ruled out: Secondary | ICD-10-CM | POA: Diagnosis not present

## 2024-04-18 DIAGNOSIS — S42202A Unspecified fracture of upper end of left humerus, initial encounter for closed fracture: Secondary | ICD-10-CM | POA: Insufficient documentation

## 2024-04-18 DIAGNOSIS — I10 Essential (primary) hypertension: Secondary | ICD-10-CM | POA: Insufficient documentation

## 2024-04-18 DIAGNOSIS — Y93H2 Activity, gardening and landscaping: Secondary | ICD-10-CM | POA: Insufficient documentation

## 2024-04-18 DIAGNOSIS — W19XXXA Unspecified fall, initial encounter: Secondary | ICD-10-CM | POA: Insufficient documentation

## 2024-04-18 DIAGNOSIS — S42212A Unspecified displaced fracture of surgical neck of left humerus, initial encounter for closed fracture: Secondary | ICD-10-CM | POA: Diagnosis not present

## 2024-04-18 DIAGNOSIS — S4992XA Unspecified injury of left shoulder and upper arm, initial encounter: Secondary | ICD-10-CM | POA: Diagnosis present

## 2024-04-18 DIAGNOSIS — W010XXA Fall on same level from slipping, tripping and stumbling without subsequent striking against object, initial encounter: Secondary | ICD-10-CM | POA: Diagnosis not present

## 2024-04-18 MED ORDER — HYDROCODONE-ACETAMINOPHEN 5-325 MG PO TABS
1.0000 | ORAL_TABLET | Freq: Once | ORAL | Status: AC
  Administered 2024-04-18: 1 via ORAL
  Filled 2024-04-18: qty 1

## 2024-04-18 MED ORDER — ACETAMINOPHEN ER 650 MG PO TBCR: 650.0000 mg | EXTENDED_RELEASE_TABLET | Freq: Three times a day (TID) | ORAL | 0 refills | Status: AC | PRN

## 2024-04-18 MED ORDER — ACETAMINOPHEN 325 MG PO TABS
650.0000 mg | ORAL_TABLET | Freq: Once | ORAL | Status: AC
  Administered 2024-04-18: 650 mg via ORAL
  Filled 2024-04-18: qty 2

## 2024-04-18 MED ORDER — HYDROCODONE-ACETAMINOPHEN 10-325 MG PO TABS: 1.0000 | ORAL_TABLET | Freq: Four times a day (QID) | ORAL | 0 refills | Status: AC | PRN

## 2024-04-18 NOTE — ED Triage Notes (Signed)
 Patient to ED via POV for a mechanical fall. Pt reports her falling onto left shoulder/ upper arm. Denies LOC or hitting head.

## 2024-04-18 NOTE — Discharge Instructions (Addendum)
 You have been diagnosed with left humeral fracture.  Please use the sling.  Please take acetaminophen  650 mg every 6 hours for pain.  You can take Norco before bedtime.  Please call Dr. Onesimo, and make an appointment for a follow-up with him.  Please come back to ED or go to your PCP if you have new symptoms symptoms worsen.

## 2024-04-18 NOTE — ED Provider Notes (Signed)
 [facility] Provider Note    Event Date/Time   First MD Initiated Contact with Patient 04/18/24 2035     (approximate)   History   Fall    HPI  Cheryl Joseph is a 83 y.o. female    with a past medical history of shortness of breath, muscle strain, abnormal gait, stroke, low back pain, who presents to the ED complaining of fall. According to the patient, today she was watering her flowers and have mechanical fall, landing on her left shoulder.  Patient denies loss of consciousness but her husband witnessed the accident and he states patient lost consciousness.  Patient is taking baby aspirin  no Eliquis or Xarelto.  Patient is here with her husband     Patient Active Problem List   Diagnosis Date Noted   Diarrhea 01/31/2024   Rash of neck 10/11/2023   Pelvic prolapse 07/22/2023   Pain of right thumb 02/16/2023   Encounter for screening mammogram for breast cancer 12/28/2022   Cerumen impaction 12/28/2022   Chronic headache 02/25/2022   Snoring 02/25/2022   History of stroke 12/31/2021   Umbilical hernia 01/05/2021   Pre-operative general physical examination 07/06/2019   Estrogen deficiency 05/25/2016   Routine general medical examination at a health care facility 11/23/2015   Hammertoe 10/28/2015   Diverticulosis of colon without hemorrhage 02/14/2015   Colon cancer screening 08/19/2014   Essential hypertension 02/25/2014   Encounter for Medicare annual wellness exam 07/16/2013   Prediabetes 05/14/2009   Osteopenia 03/07/2007   Hyperlipidemia 01/03/2007   Obesity (BMI 30-39.9) 01/03/2007   ALLERGIC RHINITIS, SEASONAL 01/03/2007   Asthma 01/03/2007   OVERACTIVE BLADDER 01/03/2007     ROS: Patient currently denies any vision changes, tinnitus, difficulty speaking, facial droop, sore throat, chest pain, shortness of breath, abdominal pain, nausea/vomiting/diarrhea, dysuria, or weakness/numbness/paresthesias in any extremity   Physical Exam    Triage Vital Signs: ED Triage Vitals  Encounter Vitals Group     BP 04/18/24 1718 (!) 146/74     Girls Systolic BP Percentile --      Girls Diastolic BP Percentile --      Boys Systolic BP Percentile --      Boys Diastolic BP Percentile --      Pulse Rate 04/18/24 1718 74     Resp 04/18/24 1718 18     Temp 04/18/24 1718 98.1 F (36.7 C)     Temp Source 04/18/24 1718 Oral     SpO2 04/18/24 1718 97 %     Weight 04/18/24 1717 200 lb (90.7 kg)     Height 04/18/24 1717 5' 2 (1.575 m)     Head Circumference --      Peak Flow --      Pain Score 04/18/24 1717 10     Pain Loc --      Pain Education --      Exclude from Growth Chart --     Most recent vital signs: Vitals:   04/18/24 1718  BP: (!) 146/74  Pulse: 74  Resp: 18  Temp: 98.1 F (36.7 C)  SpO2: 97%     Physical Exam Vitals and nursing note reviewed.  During triage patient was hypertensive  Constitutional:      General: Awake and alert.  Patient is in mild acute distress due to pain of her left shoulder.Cheryl Joseph    Appearance: Normal appearance. The patient is normal weight.      Able to speak in complete sentences without  cough or dyspnea  HENT:     Head: Normocephalic and atraumatic.     Mouth: Mucous membranes are moist.  Eyes:     General: PERRL. Normal EOMs          Conjunctiva/sclera: Conjunctivae normal.  Nose No congestion/rhinorrhea  CV:                  Good peripheral perfusion.  Regular rate and rhythm  Resp:               Normal effort.  Equal breath sounds bilaterally.  Abd:                 No distention.  Soft, nontender.  No rebound or guarding.  Musculoskeletal:        General: No swelling. Normal range of motion.  Left shoulder: Skin is intact, no ecchymosis no hematomas.  Deformity.  Tender to palpation.  Full ROM limited by pain. Pulses positive, sensation is intact.  Fingers full ROM.  Skin:    General: Skin is warm and dry.     Capillary Refill: Capillary refill takes less than 2 seconds.      Findings: No rash.  Neurological:     Mental Status: The patient is awake and alert. MAE spontaneously. No gross focal neurologic deficits are appreciated.  Psychiatric Mood and affect are normal. Speech and behavior are normal.  ED Results / Procedures / Treatments   Labs (all labs ordered are listed, but only abnormal results are displayed) Labs Reviewed - No data to display    RADIOLOGY I independently reviewed and interpreted imaging and agree with radiologists findings.    PROCEDURES:  Critical Care performed:   Procedures   MEDICATIONS ORDERED IN ED: Medications  acetaminophen  (TYLENOL ) tablet 650 mg (650 mg Oral Given 04/18/24 2124)   Clinical Course as of 04/18/24 2246  Wed Apr 18, 2024  2036 DG Shoulder Left Acute comminuted, impacted fracture of the proximal left humerus. [AE]  2039 DG Shoulder Left Acute comminuted, impacted fracture of the proximal left humerus. [AE]  2120 Consulted orthopedics on-call Dr. Onesimo.  He advised to order an shoulder CT.  [AE]  2204 CT Head Wo Contrast  No acute intracranial abnormality. [AE]  2210 Patient was updated with CT results, updated about having x-ray of the left humerus and left elbow. [AE]  2238 Consulted Dr. Onesimo with results of the CT. he advised sling, pain medication, follow-up with him.  I did update the patient and her husband they are agreeable with the plan  [AE]    Clinical Course User Index [AE] Janit Kast, PA-C    IMPRESSION / MDM / ASSESSMENT AND PLAN / ED COURSE  I reviewed the triage vital signs and the nursing notes.  Differential diagnosis includes, but is not limited to, fracture, dislocation, tendon injury, intracranial hemorrhage unlikely foreign body  Patient's presentation is most consistent with acute complicated illness / injury requiring diagnostic workup.   Cheryl Joseph is a 83 y.o., female who presents today after having a mechanical fall and landing on her left  shoulder.  On physical exam, skin is intact no ecchymosis no hematomas, deformity.  Full ROM limited by pain, pulses positive sensation intact fingers full ROM.  Rest of physical exam is normal Plan Acetaminophen  650 Call orthopedics Head CT Consulted orthopedics Dr. Onesimo who suggested to order shoulder CT. Patient's diagnosis is consistent with left femur fracture. I independently reviewed and interpreted imaging and agree with radiologists  findings. I did review the patient's allergies and medications.The patient is in stable and satisfactory condition for discharge home.  Patient was placed on a sling and received Norco.  Patient will be discharged home with prescriptions for acetaminophen  650, Norco. Patient is to follow up with orthopedics, [facility] as needed or otherwise directed. Patient is given ED precautions to return to the ED for any worsening or new symptoms. Discussed plan of care with patient, answered all of patient's questions, Patient agreeable to plan of care. Advised patient to take medications according to the instructions on the label. Discussed possible side effects of new medications. Patient verbalized understanding.    FINAL CLINICAL IMPRESSION(S) / ED DIAGNOSES   Final diagnoses:  Closed fracture of proximal end of left humerus, unspecified fracture morphology, initial encounter     Rx / DC Orders   ED Discharge Orders          Ordered    HYDROcodone -acetaminophen  (NORCO) 10-325 MG tablet  Every 6 hours PRN        04/18/24 2246    acetaminophen  (ACETAMINOPHEN  8 HOUR) 650 MG CR tablet  Every 8 hours PRN        04/18/24 2246             Note:  This document was prepared using Dragon voice recognition software and may include unintentional dictation errors.   Janit Kast, PA-C 04/18/24 2246    Jacolyn Pae, MD 04/18/24 [phone]

## 2024-04-19 ENCOUNTER — Ambulatory Visit (INDEPENDENT_AMBULATORY_CARE_PROVIDER_SITE_OTHER)

## 2024-04-19 ENCOUNTER — Telehealth: Payer: Self-pay | Admitting: Orthopedic Surgery

## 2024-04-19 DIAGNOSIS — S42292A Other displaced fracture of upper end of left humerus, initial encounter for closed fracture: Secondary | ICD-10-CM | POA: Diagnosis not present

## 2024-04-19 NOTE — Telephone Encounter (Signed)
 Patient came from the ER, and said she has to f/u in one day. She needs an appointment. CB#[phone]

## 2024-04-19 NOTE — Progress Notes (Signed)
 Orthopaedic Surgery New Patient Visit   History of Present Illness: The patient is a 83 y.o. left hand dominant female seen in clinic for left shoulder pain.  Patient presents as a follow-up from the emergency department from last night.  She reports she had a fall from ground-level while tending to her garden.  She states she fell on an outstretched left arm.  Patient denies loss of consciousness prior to the fall.  She experienced immediate pain in the left shoulder which resulted in her visit to the emergency department.  While in the ED, imaging was obtained and patient was found to have a left proximal humerus fracture.  Orthopedics was consulted and recommendations were for sling immobilization and follow-up in clinic.  Since her injury, patient admits to pain in the left shoulder.  She denies any numbness or paresthesias in the left arm or injury elsewhere.  She states her pain is better with the sling immobilization and pain medication.  No other complaints at this time.  Patient presents with her husband today.  She is retired.     Past Medical, Social and Family History: Past Medical History:  Diagnosis Date   Anginal pain    Arthritis    RA hands(seronegative)   Asthma    Back pain    Cancer (HCC)    skin   Cataract    HPV in female 1996   HPV with colposcopy (all neg paps since)   Hyperlipidemia    Hypertension    Incarcerated ventral hernia    Obesity    Osteopenia    Pre-diabetes    Shortness of breath dyspnea    Shoulder pain    Past Surgical History:  Procedure Laterality Date   CATARACT EXTRACTION, BILATERAL Bilateral    COLONOSCOPY  06/01/2004   COLONOSCOPY WITH PROPOFOL  N/A 05/01/2015   Procedure: COLONOSCOPY WITH PROPOFOL ;  Surgeon: Lamar ONEIDA Holmes, MD;  Location: [facility] ENDOSCOPY;  Service: Endoscopy;  Laterality: N/A;   CORRECTION HAMMER TOE Right    2nd toe   JOINT REPLACEMENT Right    right knee replacement January 2021   SQUAMOUS CELL CARCINOMA  EXCISION Left 12/2017   left arm   TONSILLECTOMY     VAGINAL HYSTERECTOMY  2015   with pelvic organ prolapse surgery    XI ROBOTIC ASSISTED VENTRAL HERNIA N/A 06/10/2021   Procedure: XI ROBOTIC ASSISTED VENTRAL HERNIA;  Surgeon: Rodolph Romano, MD;  Location: [facility] ORS;  Service: General;  Laterality: N/A;   Allergies  Allergen Reactions   Atorvastatin      Headache    Atorvastatin  Calcium  Other (See Comments)    atorvastatin  calcium    Solifenacin Succinate Other (See Comments)    solifenacin   Tolterodine Tartrate Other (See Comments)    tolterodine   Zetia  [Ezetimibe ]     Headache    Current Outpatient Medications on File Prior to Visit  Medication Sig Dispense Refill   acetaminophen  (ACETAMINOPHEN  8 HOUR) 650 MG CR tablet Take 1 tablet (650 mg total) by mouth every 8 (eight) hours as needed for pain. 90 tablet 0   albuterol  (VENTOLIN  HFA) 108 (90 Base) MCG/ACT inhaler INHALE TWO PUFFS BY MOUTH EVERY 4 HOURS AS NEEDED FOR WHEEZING AND 2 PUFFS BEFORE EXERCISE OR EXPOSURE TO COLD AIR 18 each 3   amLODipine  (NORVASC ) 5 MG tablet Take 1 tablet (5 mg total) by mouth daily. 90 tablet 3   aspirin  EC 81 MG tablet Take 1 tablet (81 mg total) by mouth daily. Swallow whole.  b complex vitamins tablet Take 1 tablet by mouth every Monday, Wednesday, and Friday.     benzonatate  (TESSALON ) 200 MG capsule Take 1 capsule (200 mg total) by mouth 3 (three) times daily as needed for cough. Swallow whole 30 capsule 1   celecoxib (CELEBREX) 100 MG capsule Take 1 capsule by mouth daily as needed.     Cholecalciferol (VITAMIN D3 PO) Take 2 tablets by mouth daily.     Hyaluronic Acid-Vitamin C (HYALURONIC ACID PO) Take 1 tablet by mouth every Monday, Wednesday, and Friday.     HYDROcodone -acetaminophen  (NORCO) 10-325 MG tablet Take 1 tablet by mouth every 6 (six) hours as needed. 30 tablet 0   Melatonin 1 MG CAPS Take by mouth.     melatonin 1 MG TABS tablet Take 1 mg by mouth.     meloxicam  (MOBIC) 7.5 MG tablet Take 1 tablet by mouth daily.     montelukast  (SINGULAIR ) 10 MG tablet Take 1 tablet (10 mg total) by mouth at bedtime. 90 tablet 3   Multiple Vitamin (MULTIVITAMIN) capsule Take 1 capsule by mouth daily. Reported on 12/18/2015     Omega-3 Fatty Acids (FISH OIL PO) Take 1 capsule by mouth daily. Reported on 12/18/2015     OVER THE COUNTER MEDICATION Take 2 capsules by mouth daily. BONE UP     oxybutynin  (OXYTROL ) 3.9 MG/24HR Place 1 patch onto the skin once a week. Sunday.  Home med.     Probiotic Product (PROBIOTIC DAILY PO) Take 1 tablet by mouth daily.     Pumpkin Seed-Soy Germ (AZO BLADDER CONTROL/GO-LESS PO) Take 1 capsule by mouth at bedtime.     rosuvastatin  (CRESTOR ) 20 MG tablet Take 1 tablet (20 mg total) by mouth every other day. Takes 3 days per week 45 tablet 3   simvastatin  (ZOCOR ) 40 MG tablet Take 1 tablet (40 mg total) by mouth every other day. 45 tablet 3   triamcinolone  cream (KENALOG ) 0.1 % Apply 1 Application topically 2 (two) times daily. To affected (rash) area 30 g 0   TURMERIC PO Take 720 mg by mouth every Monday, Wednesday, and Friday.     Ubiquinol 100 MG CAPS Take 1 capsule by mouth every Monday, Wednesday, and Friday.     No current facility-administered medications on file prior to visit.   Social History   Tobacco Use   Smoking status: Never   Smokeless tobacco: Never  Vaping Use   Vaping status: Never Used  Substance Use Topics   Alcohol use: No    Alcohol/week: 0.0 standard drinks of alcohol   Drug use: No      I have reviewed past medical, surgical, social and family history, medications and allergies as documented in the EMR.  Review of Systems - A ROS was performed including pertinent positives and negatives as documented in the HPI.     Physical Exam:  General/Constitutional: NAD Vascular: No edema, swelling or tenderness, except as noted in detailed exam Integumentary: No impressive skin lesions present, except as  noted in detailed exam Neuro/Psych: Normal mood and affect, oriented to person, place and time Musculoskeletal: Normal, except as noted in detailed exam and in HPI   Focused examination:  On focused exam of the left upper extremity, skin is intact around the shoulder, arm and more distally about the elbow wrist and hand.  There is ecchymosis notable about the anterior aspect of the humerus.  Slight drooping of the left shoulder noted.  Sling was removed for more  detailed evaluation.  Patient does have tenderness to palpation about the proximal humerus.  Nontender to palpation about the neck, distal arm, elbow, forearm, wrist and hand.  Patient is able to fire her lateral deltoid but a thorough range of motion evaluation was deferred secondary to her injury.  She does have range of motion within normal limits and smooth arc about the elbow, wrist and hand without pain.  Sensation is intact over the lateral deltoid as well as distally about the hand in median, ulnar and radial nerve distributions.  2+ radial pulse with appropriate capillary refill in all digits of the left hand.         XR  Imaging: X-rays of the left shoulder, humerus and elbow were obtained on 04/18/2024 at [facility] and were available for my review.  Per my interpretation there is a comminuted, mildly displaced fracture about the left proximal humerus.  Greater tuberosity fragment is slightly displaced.  Humeral head is impacted with varus angulation but overall well aligned.  No appreciable fractures noted distally about the humerus or elbow.  Advanced CT imaging: CT left shoulder including coronal, axial, sagittal cuts were obtained on 04/18/2024 at [facility] were available for my review.  Per my interpretation, there is redemonstrated comminuted and impacted left proximal humerus fracture.  Displacement of several fracture fragments.  Radiology Read: 1. Acute comminuted fracture of the left humeral head and  neck with impaction of fracture fragments. 2. Soft tissue hematoma lateral to the mid/distal humeral shaft, incompletely included.   Assessment:  Left proximal humerus fracture-fairly well aligned  Plan:  Patient was seen and examined in office today. We reviewed patient's history, examination, and imaging in detail. Based on information available for this encounter, patient found to have a left proximal humerus fracture.  We discussed the nature of the injury with the patient and what this means for treatment moving forward.  Explained to patient and her husband that the humeral head remains in fairly good alignment at this time which is favorable for nonoperative treatment.  We did caution patient that within this time period the bones can still shift.  We also discussed the importance of sling immobilization at this time and until next follow up.  Plan will be for nonweightbearing to the left upper extremity with sling immobilization.  Discussed strategy for hygiene and dressing during this time.  We also discussed in terms of pain control the Tylenol  would be appropriate as well as ice to the affected area.  Encourage patient to continue active range of motion about the elbow wrist and hand to avoid stiffness in these joints. However, patient is to avoid motion in the left shoulder at this time. We will see her back in 2 weeks for repeat radiographs of the left shoulder.  If fracture remains in good alignment, we will plan on progressing patient with physical therapy at that time.  Caution patient if she were to develop new numbness, increasing pain, or pale/blue hand that she is to return to the emergency department right away for evaluation.   All questions, concerns and comments were addressed to the best of my ability.  Follow-up: 2 weeks for repeat evaluation and XR Left shoulder    Arlyss GEANNIE Schneider, DO Orthopedic Surgery & Sports Medicine [place]

## 2024-05-02 ENCOUNTER — Ambulatory Visit

## 2024-05-02 DIAGNOSIS — S42352A Displaced comminuted fracture of shaft of humerus, left arm, initial encounter for closed fracture: Secondary | ICD-10-CM | POA: Diagnosis not present

## 2024-05-02 DIAGNOSIS — S42292A Other displaced fracture of upper end of left humerus, initial encounter for closed fracture: Secondary | ICD-10-CM

## 2024-05-02 DIAGNOSIS — S4292XA Fracture of left shoulder girdle, part unspecified, initial encounter for closed fracture: Secondary | ICD-10-CM | POA: Diagnosis not present

## 2024-05-02 DIAGNOSIS — S42292D Other displaced fracture of upper end of left humerus, subsequent encounter for fracture with routine healing: Secondary | ICD-10-CM

## 2024-05-02 NOTE — Patient Instructions (Signed)

## 2024-05-02 NOTE — Addendum Note (Signed)
 Addended by: VANDERBILT LIONEL CROME on: 05/02/2024 01:40 PM   Modules accepted: Orders

## 2024-05-02 NOTE — Addendum Note (Signed)
 Addended by: GUST MOLLY on: 05/02/2024 02:01 PM   Modules accepted: Orders

## 2024-05-02 NOTE — Progress Notes (Signed)
 Orthopedic Follow-Up Note     SUBJECTIVE:   Cheryl Joseph is a 83 y.o. year old who presents for follow up of closed displaced fracture of proximal end of left humerus.  Patient presents with her husband today.  She is roughly 2 weeks status post injury.  Patient was last seen in our office on 04/19/2024.  She reports she has been compliant with sling immobilization.  She has been working on elbow, wrist and hand range of motion as well as coming out of the sling and letting the arm hang for hygiene purposes.  She does report pain has improved since her last visit.  She is still taking Tylenol  and occasionally hydrocodone  to stay ahead of the pain.  Most of her issues stem from getting up out of a chair with the use of only 1 arm.  Denies new symptoms of numbness or paresthesias or increasing pain.  Patient is a little discouraged as her function has diminished since the injury began.   Patient has been evaluated via DEXA scan. Revealed osteopenia. PCP, Dr. Laine Balls with [facility], advised continuation of VitD and Ca supplementation.     Past Medical History:  Diagnosis Date   Anginal pain    Arthritis    RA hands(seronegative)   Asthma    Back pain    Cancer (HCC)    skin   Cataract    HPV in female 1996   HPV with colposcopy (all neg paps since)   Hyperlipidemia    Hypertension    Incarcerated ventral hernia    Obesity    Osteopenia    Pre-diabetes    Shortness of breath dyspnea    Shoulder pain    Past Surgical History:  Procedure Laterality Date   CATARACT EXTRACTION, BILATERAL Bilateral    COLONOSCOPY  06/01/2004   COLONOSCOPY WITH PROPOFOL  N/A 05/01/2015   Procedure: COLONOSCOPY WITH PROPOFOL ;  Surgeon: Lamar ONEIDA Holmes, MD;  Location[facility] ENDOSCOPY;  Service: Endoscopy;  Laterality: N/A;   CORRECTION HAMMER TOE Right    2nd toe   JOINT REPLACEMENT Right    right knee replacement January 2021   SQUAMOUS CELL CARCINOMA EXCISION  Left 12/2017   left arm   TONSILLECTOMY     VAGINAL HYSTERECTOMY  2015   with pelvic organ prolapse surgery    XI ROBOTIC ASSISTED VENTRAL HERNIA N/A 06/10/2021   Procedure: XI ROBOTIC ASSISTED VENTRAL HERNIA;  Surgeon: Rodolph Romano, MD;  Location: [facility] ORS;  Service: General;  Laterality: N/A;    Current Outpatient Medications:    acetaminophen  (ACETAMINOPHEN  8 HOUR) 650 MG CR tablet, Take 1 tablet (650 mg total) by mouth every 8 (eight) hours as needed for pain., Disp: 90 tablet, Rfl: 0   albuterol  (VENTOLIN  HFA) 108 (90 Base) MCG/ACT inhaler, INHALE TWO PUFFS BY MOUTH EVERY 4 HOURS AS NEEDED FOR WHEEZING AND 2 PUFFS BEFORE EXERCISE OR EXPOSURE TO COLD AIR, Disp: 18 each, Rfl: 3   amLODipine  (NORVASC ) 5 MG tablet, Take 1 tablet (5 mg total) by mouth daily., Disp: 90 tablet, Rfl: 3   aspirin  EC 81 MG tablet, Take 1 tablet (81 mg total) by mouth daily. Swallow whole., Disp: , Rfl:    b complex vitamins tablet, Take 1 tablet by mouth every Monday, Wednesday, and Friday., Disp: , Rfl:    benzonatate  (TESSALON ) 200 MG capsule, Take 1 capsule (200 mg total) by mouth 3 (three) times daily as needed for cough. Swallow whole, Disp: 30  capsule, Rfl: 1   celecoxib (CELEBREX) 100 MG capsule, Take 1 capsule by mouth daily as needed., Disp: , Rfl:    Cholecalciferol (VITAMIN D3 PO), Take 2 tablets by mouth daily., Disp: , Rfl:    Hyaluronic Acid-Vitamin C (HYALURONIC ACID PO), Take 1 tablet by mouth every Monday, Wednesday, and Friday., Disp: , Rfl:    HYDROcodone -acetaminophen  (NORCO) 10-325 MG tablet, Take 1 tablet by mouth every 6 (six) hours as needed., Disp: 30 tablet, Rfl: 0   Melatonin 1 MG CAPS, Take by mouth., Disp: , Rfl:    melatonin 1 MG TABS tablet, Take 1 mg by mouth., Disp: , Rfl:    meloxicam (MOBIC) 7.5 MG tablet, Take 1 tablet by mouth daily., Disp: , Rfl:    montelukast  (SINGULAIR ) 10 MG tablet, Take 1 tablet (10 mg total) by mouth at bedtime., Disp: 90 tablet, Rfl: 3    Multiple Vitamin (MULTIVITAMIN) capsule, Take 1 capsule by mouth daily. Reported on 12/18/2015, Disp: , Rfl:    Omega-3 Fatty Acids (FISH OIL PO), Take 1 capsule by mouth daily. Reported on 12/18/2015, Disp: , Rfl:    OVER THE COUNTER MEDICATION, Take 2 capsules by mouth daily. BONE UP, Disp: , Rfl:    oxybutynin  (OXYTROL ) 3.9 MG/24HR, Place 1 patch onto the skin once a week. Sunday.  Home med., Disp: , Rfl:    Probiotic Product (PROBIOTIC DAILY PO), Take 1 tablet by mouth daily., Disp: , Rfl:    Pumpkin Seed-Soy Germ (AZO BLADDER CONTROL/GO-LESS PO), Take 1 capsule by mouth at bedtime., Disp: , Rfl:    rosuvastatin  (CRESTOR ) 20 MG tablet, Take 1 tablet (20 mg total) by mouth every other day. Takes 3 days per week, Disp: 45 tablet, Rfl: 3   simvastatin  (ZOCOR ) 40 MG tablet, Take 1 tablet (40 mg total) by mouth every other day., Disp: 45 tablet, Rfl: 3   triamcinolone  cream (KENALOG ) 0.1 %, Apply 1 Application topically 2 (two) times daily. To affected (rash) area, Disp: 30 g, Rfl: 0   TURMERIC PO, Take 720 mg by mouth every Monday, Wednesday, and Friday., Disp: , Rfl:    Ubiquinol 100 MG CAPS, Take 1 capsule by mouth every Monday, Wednesday, and Friday., Disp: , Rfl:  Allergies  Allergen Reactions   Atorvastatin      Headache    Atorvastatin  Calcium  Other (See Comments)    atorvastatin  calcium    Solifenacin Succinate Other (See Comments)    solifenacin   Tolterodine Tartrate Other (See Comments)    tolterodine   Zetia  [Ezetimibe ]     Headache    Social History   Socioeconomic History   Marital status: Married    Spouse name: Bennet   Number of children: 3   Years of education: Not on file   Highest education level: Not on file  Occupational History   Not on file  Tobacco Use   Smoking status: Never   Smokeless tobacco: Never  Vaping Use   Vaping status: Never Used  Substance and Sexual Activity   Alcohol use: No    Alcohol/week: 0.0 standard drinks of alcohol   Drug use: No    Sexual activity: Yes  Other Topics Concern   Not on file  Social History Narrative   Remarried.   19 grandchildren between pt and husband.   7 great grandchildren.   Social Drivers of Health   Financial Resource Strain: Low Risk  (10/27/2023)   Overall Financial Resource Strain (CARDIA)    Difficulty of Paying Living Expenses:  Not hard at all  Food Insecurity: No Food Insecurity (10/27/2023)   Hunger Vital Sign    Worried About Running Out of Food in the Last Year: Never true    Ran Out of Food in the Last Year: Never true  Transportation Needs: No Transportation Needs (10/27/2023)   PRAPARE - Administrator, Civil Service (Medical): No    Lack of Transportation (Non-Medical): No  Physical Activity: Sufficiently Active (10/27/2023)   Exercise Vital Sign    Days of Exercise per Week: 5 days    Minutes of Exercise per Session: 30 min  Stress: No Stress Concern Present (10/27/2023)   [employer] of Occupational Health - Occupational Stress Questionnaire    Feeling of Stress : Not at all  Social Connections: Socially Integrated (10/27/2023)   Social Connection and Isolation Panel    Frequency of Communication with Friends and Family: More than three times a week    Frequency of Social Gatherings with Friends and Family: More than three times a week    Attends Religious Services: More than 4 times per year    Active Member of [employer] or Organizations: Yes    Attends Engineer, structural: More than 4 times per year    Marital Status: Married  Catering manager Violence: Not At Risk (10/27/2023)   Humiliation, Afraid, Rape, and Kick questionnaire    Fear of Current or Ex-Partner: No    Emotionally Abused: No    Physically Abused: No    Sexually Abused: No   Family History  Problem Relation Age of Onset   Diabetes Mother    Hyperlipidemia Mother    Heart disease Mother    Hypertension Mother    Obesity Mother    Kyphosis Mother    Heart disease Father     Hypertension Father    Cancer Father        lung Cancer smoker   Diabetes Daughter    Kyphosis Sister    Breast cancer Neg Hx        OBJECTIVE:    Constitutional:   The patient is alert and oriented x 3, appears to be stated age and in no distress.   Orthopaedic Examination:  Left upper extremity: Sling intact about the left upper extremity-removed for examination.  Skin is intact about the shoulder and distal about the arm.  Ecchymosis noted at the antecubital fossa and posteriorly along the olecranon.  Nontender to palpation about the proximal arm.  Sensation intact over the axillary nerve distribution of the lateral deltoid.  Sensation intact about the medial, ulnar, radial nerve distributions of the hand.  Elbow range of motion within normal limits.  Wrist and hand motion within normal limits. Shoulder range of motion examination deferred secondary to injury.  Addendum: Metal ring on the left ring finger. There are no signs of skin or circulation compromise based on the ring location. All fingers are warm and well perfused with appropriate capillary refill.     IMAGING:   Left shoulder imaging: X-ray left shoulder including AP, Grashey, scapular Y views were obtained in office today and were available for my review.  Per my interpretation, proximal humerus fracture redemonstrated with minimally displaced greater tuberosity fragment as well as surgical neck fracture with some comminution.  The humeral head remains in slight varus but overall alignment appropriate in relation to the humeral shaft.  Pseudosubluxation present on imaging.  In comparison to previous radiographs taken from the emergency department on 04/18/2024, there is  no interval displacement of the fracture fragments.       IMPRESSION:  Left proximal humerus fracture with comminution and minimal displacement-2 weeks from injury, progressing well    PLAN:  Patient was seen and examined in office today with husband  present.  We discussed her left proximal humerus fracture.  We believe she is progressing as expected.  Her pain level is diminishing.  She has been compliant with sling immobilization and minimal movement of the shoulder.  She is working on range of motion of the elbow, wrist and hand.  Her imaging taken today in the office confirms the fracture has remained stable compared to the initial injury.  We provided reassurance as patient is struggling with the loss of functionality in the left arm.  Explained to the patient and her husband that this is to be expected early on in the recovery process.  Patient may continue to take Tylenol  as needed for pain.  Also encouraged patient to remain mobile throughout this process but be careful and public to prevent falls.  We also explained next steps.  Over the next few weeks, patient will remain in the sling except for exercise and hygiene purposes.  The nonoperative treatment protocol was explained to the patient and given to them for education purposes.  She may begin gentle pendulum exercises but is to avoid any strengthening or motion with the left shoulder.  A prescription for formal physical therapy has been provided.  She will get connected with the physical therapist over the next few weeks.  We would like to see her back in office in 2 weeks to check her progress and obtain repeat radiographs.  All questions and concerns were addressed to the best of my ability.  Radiographs to be obtained at next visit: X-ray left shoulder 3 views, AP, Grashey, Scapular Y  Follow-up in 2 weeks  Addendum: Made a telephone call to patient on 05/03/24 at 12:09 PM discussing the ring on her left ring finger. There were no concerns on exam in office on 05/02/24 of the ring compromising circulation to the finger. However, I cautioned the patient to monitor for changes such as new numbness, change in color or temperature of the ring or significant increase in swelling around the  finger. Educated patient that she is to notify our office if these changes occur as the ring may need to be removed as a result. Patient acknowledges and will keep us  informed.  Arlyss Schneider, DO Orthopedic Surgery & Sports Medicine [facility] [place]

## 2024-05-08 ENCOUNTER — Telehealth: Payer: Self-pay

## 2024-05-08 NOTE — Telephone Encounter (Signed)
 Pt called stating that the Dr wanted her to start pt this week, but she hasn't been able to find  place to go. Call back number is [phone]

## 2024-05-08 NOTE — Telephone Encounter (Signed)
 I called and pt said her insurance doesn't cover [facility] she would like the referral sent to [facility]. Can you please send the referral them as urgent

## 2024-05-09 NOTE — Telephone Encounter (Signed)
I called and updated pt.

## 2024-05-09 NOTE — Telephone Encounter (Signed)
 I called [facility] and faxed the referral to them

## 2024-05-10 ENCOUNTER — Telehealth: Payer: Self-pay

## 2024-05-10 NOTE — Telephone Encounter (Signed)
 Called and talked to the pt. She will go to [facility] for PT. I called [facility] and gave them her information

## 2024-05-10 NOTE — Telephone Encounter (Signed)
 Pt called stating that if [facility] hasn't called her. They need a referral and don't have one. Pt call back number [phone]

## 2024-05-10 NOTE — Telephone Encounter (Signed)
 Cheryl Joseph H I think thi sis you.

## 2024-05-11 DIAGNOSIS — M25512 Pain in left shoulder: Secondary | ICD-10-CM | POA: Diagnosis not present

## 2024-05-11 DIAGNOSIS — R2689 Other abnormalities of gait and mobility: Secondary | ICD-10-CM | POA: Diagnosis not present

## 2024-05-14 DIAGNOSIS — R2689 Other abnormalities of gait and mobility: Secondary | ICD-10-CM | POA: Diagnosis not present

## 2024-05-14 DIAGNOSIS — M25512 Pain in left shoulder: Secondary | ICD-10-CM | POA: Diagnosis not present

## 2024-05-16 ENCOUNTER — Ambulatory Visit

## 2024-05-16 DIAGNOSIS — S42292A Other displaced fracture of upper end of left humerus, initial encounter for closed fracture: Secondary | ICD-10-CM

## 2024-05-16 DIAGNOSIS — S42202D Unspecified fracture of upper end of left humerus, subsequent encounter for fracture with routine healing: Secondary | ICD-10-CM | POA: Diagnosis not present

## 2024-05-16 NOTE — Patient Instructions (Signed)

## 2024-05-16 NOTE — Progress Notes (Signed)
 Orthopedic Follow-Up Note     SUBJECTIVE:   Cheryl Joseph is a 83 y.o. year old who presents for follow up of closed displaced fracture of proximal end of left humerus.  Patient presents with her husband today.  She is roughly 4 weeks status post injury.  Patient was last seen in our office on 05/02/2024.  She remains compliant with sling immobilization.  She has been working on elbow, wrist and hand range of motion as well as coming out of the sling and letting the arm hang for hygiene purposes.  She reports continued improvement in pain since her last visit.  Occasionally taking Tylenol  as needed.  Patient has also began physical therapy.  She underwent an initial evaluation and has plans on attending twice a week.  Denies new symptoms of numbness or paresthesias or increasing pain.  Reporting some stiffness in the right thumb. She believes she is making good progress as her pain is very manageable at this time.  She is a little bit nervous about coming out of the sling during the day.  Patient is still sleeping in a recliner.  She feels like she would benefit from going out and getting her hair done, going to birthday parties, etc as it has been a challenge on her mental wellbeing being immobilized.        Past Medical History:  Diagnosis Date   Anginal pain    Arthritis    RA hands(seronegative)   Asthma    Back pain    Cancer (HCC)    skin   Cataract    HPV in female 1996   HPV with colposcopy (all neg paps since)   Hyperlipidemia    Hypertension    Incarcerated ventral hernia    Obesity    Osteopenia    Pre-diabetes    Shortness of breath dyspnea    Shoulder pain    Past Surgical History:  Procedure Laterality Date   CATARACT EXTRACTION, BILATERAL Bilateral    COLONOSCOPY  06/01/2004   COLONOSCOPY WITH PROPOFOL  N/A 05/01/2015   Procedure: COLONOSCOPY WITH PROPOFOL ;  Surgeon: Lamar ONEIDA Holmes, MD;  Location: [facility] ENDOSCOPY;  Service: Endoscopy;  Laterality: N/A;    CORRECTION HAMMER TOE Right    2nd toe   JOINT REPLACEMENT Right    right knee replacement January 2021   SQUAMOUS CELL CARCINOMA EXCISION Left 12/2017   left arm   TONSILLECTOMY     VAGINAL HYSTERECTOMY  2015   with pelvic organ prolapse surgery    XI ROBOTIC ASSISTED VENTRAL HERNIA N/A 06/10/2021   Procedure: XI ROBOTIC ASSISTED VENTRAL HERNIA;  Surgeon: Rodolph Romano, MD;  Location: [facility] ORS;  Service: General;  Laterality: N/A;    Current Outpatient Medications:    acetaminophen  (ACETAMINOPHEN  8 HOUR) 650 MG CR tablet, Take 1 tablet (650 mg total) by mouth every 8 (eight) hours as needed for pain., Disp: 90 tablet, Rfl: 0   albuterol  (VENTOLIN  HFA) 108 (90 Base) MCG/ACT inhaler, INHALE TWO PUFFS BY MOUTH EVERY 4 HOURS AS NEEDED FOR WHEEZING AND 2 PUFFS BEFORE EXERCISE OR EXPOSURE TO COLD AIR, Disp: 18 each, Rfl: 3   amLODipine  (NORVASC ) 5 MG tablet, Take 1 tablet (5 mg total) by mouth daily., Disp: 90 tablet, Rfl: 3   aspirin  EC 81 MG tablet, Take 1 tablet (81 mg total) by mouth daily. Swallow whole., Disp: , Rfl:    b complex vitamins tablet, Take 1 tablet by mouth every Monday, Wednesday, and Friday., Disp: , Rfl:  benzonatate  (TESSALON ) 200 MG capsule, Take 1 capsule (200 mg total) by mouth 3 (three) times daily as needed for cough. Swallow whole, Disp: 30 capsule, Rfl: 1   celecoxib (CELEBREX) 100 MG capsule, Take 1 capsule by mouth daily as needed., Disp: , Rfl:    Cholecalciferol (VITAMIN D3 PO), Take 2 tablets by mouth daily., Disp: , Rfl:    Hyaluronic Acid-Vitamin C (HYALURONIC ACID PO), Take 1 tablet by mouth every Monday, Wednesday, and Friday., Disp: , Rfl:    HYDROcodone -acetaminophen  (NORCO) 10-325 MG tablet, Take 1 tablet by mouth every 6 (six) hours as needed., Disp: 30 tablet, Rfl: 0   Melatonin 1 MG CAPS, Take by mouth., Disp: , Rfl:    melatonin 1 MG TABS tablet, Take 1 mg by mouth., Disp: , Rfl:    meloxicam (MOBIC) 7.5 MG tablet, Take 1 tablet by mouth  daily., Disp: , Rfl:    montelukast  (SINGULAIR ) 10 MG tablet, Take 1 tablet (10 mg total) by mouth at bedtime., Disp: 90 tablet, Rfl: 3   Multiple Vitamin (MULTIVITAMIN) capsule, Take 1 capsule by mouth daily. Reported on 12/18/2015, Disp: , Rfl:    Omega-3 Fatty Acids (FISH OIL PO), Take 1 capsule by mouth daily. Reported on 12/18/2015, Disp: , Rfl:    OVER THE COUNTER MEDICATION, Take 2 capsules by mouth daily. BONE UP, Disp: , Rfl:    oxybutynin  (OXYTROL ) 3.9 MG/24HR, Place 1 patch onto the skin once a week. Sunday.  Home med., Disp: , Rfl:    Probiotic Product (PROBIOTIC DAILY PO), Take 1 tablet by mouth daily., Disp: , Rfl:    Pumpkin Seed-Soy Germ (AZO BLADDER CONTROL/GO-LESS PO), Take 1 capsule by mouth at bedtime., Disp: , Rfl:    rosuvastatin  (CRESTOR ) 20 MG tablet, Take 1 tablet (20 mg total) by mouth every other day. Takes 3 days per week, Disp: 45 tablet, Rfl: 3   simvastatin  (ZOCOR ) 40 MG tablet, Take 1 tablet (40 mg total) by mouth every other day., Disp: 45 tablet, Rfl: 3   triamcinolone  cream (KENALOG ) 0.1 %, Apply 1 Application topically 2 (two) times daily. To affected (rash) area, Disp: 30 g, Rfl: 0   TURMERIC PO, Take 720 mg by mouth every Monday, Wednesday, and Friday., Disp: , Rfl:    Ubiquinol 100 MG CAPS, Take 1 capsule by mouth every Monday, Wednesday, and Friday., Disp: , Rfl:  Allergies  Allergen Reactions   Atorvastatin      Headache    Atorvastatin  Calcium  Other (See Comments)    atorvastatin  calcium    Solifenacin Succinate Other (See Comments)    solifenacin   Tolterodine Tartrate Other (See Comments)    tolterodine   Zetia  [Ezetimibe ]     Headache    Social History   Socioeconomic History   Marital status: Married    Spouse name: Bennet   Number of children: 3   Years of education: Not on file   Highest education level: Not on file  Occupational History   Not on file  Tobacco Use   Smoking status: Never   Smokeless tobacco: Never  Vaping Use    Vaping status: Never Used  Substance and Sexual Activity   Alcohol use: No    Alcohol/week: 0.0 standard drinks of alcohol   Drug use: No   Sexual activity: Yes  Other Topics Concern   Not on file  Social History Narrative   Remarried.   19 grandchildren between pt and husband.   7 great grandchildren.   Social Drivers of  Health   Financial Resource Strain: Low Risk  (10/27/2023)   Overall Financial Resource Strain (CARDIA)    Difficulty of Paying Living Expenses: Not hard at all  Food Insecurity: No Food Insecurity (10/27/2023)   Hunger Vital Sign    Worried About Running Out of Food in the Last Year: Never true    Ran Out of Food in the Last Year: Never true  Transportation Needs: No Transportation Needs (10/27/2023)   PRAPARE - Administrator, Civil Service (Medical): No    Lack of Transportation (Non-Medical): No  Physical Activity: Sufficiently Active (10/27/2023)   Exercise Vital Sign    Days of Exercise per Week: 5 days    Minutes of Exercise per Session: 30 min  Stress: No Stress Concern Present (10/27/2023)   [employer] of Occupational Health - Occupational Stress Questionnaire    Feeling of Stress : Not at all  Social Connections: Socially Integrated (10/27/2023)   Social Connection and Isolation Panel    Frequency of Communication with Friends and Family: More than three times a week    Frequency of Social Gatherings with Friends and Family: More than three times a week    Attends Religious Services: More than 4 times per year    Active Member of [employer] or Organizations: Yes    Attends Engineer, structural: More than 4 times per year    Marital Status: Married  Catering manager Violence: Not At Risk (10/27/2023)   Humiliation, Afraid, Rape, and Kick questionnaire    Fear of Current or Ex-Partner: No    Emotionally Abused: No    Physically Abused: No    Sexually Abused: No   Family History  Problem Relation Age of Onset   Diabetes Mother     Hyperlipidemia Mother    Heart disease Mother    Hypertension Mother    Obesity Mother    Kyphosis Mother    Heart disease Father    Hypertension Father    Cancer Father        lung Cancer smoker   Diabetes Daughter    Kyphosis Sister    Breast cancer Neg Hx        OBJECTIVE:    Constitutional:   The patient is alert and oriented x 3, appears to be stated age and in no distress.   Orthopaedic Examination:  Left upper extremity: Sling intact about the left upper extremity-removed for examination.  Skin is intact about the shoulder and distal about the arm.  Minimal ecchymosis noted at the antecubital fossa and distally about the forearm and into the thumb.  Nontender to palpation about the proximal arm including the shoulder region.  Sensation intact over the axillary nerve distribution of the lateral deltoid.  Sensation intact about the medial, ulnar, radial nerve distributions of the hand.  Elbow range of motion within normal limits.  Wrist and hand motion within normal limits. Shoulder range of motion examination deferred secondary to injury.  Edema noted in the right thumb.  Nontender to palpation over the distal phalanx, IP joint and proximal phalanx.      IMAGING:   Left shoulder imaging: X-ray left shoulder including AP, Grashey, scapular Y views were obtained in office today and were available for my review.  Per my interpretation, proximal humerus fracture redemonstrated with minimally displaced greater tuberosity fragment as well as surgical neck fracture with some comminution.  The humeral head remains in slight varus but overall alignment appropriate in relation to the humeral  shaft.  Early bridging callus appreciated on multiple views across the fracture sites.  In comparison to previous radiographs taken from the emergency department on 04/18/2024 and 05/02/2024 in office, there is no interval displacement of the fracture fragments.       IMPRESSION:  Left proximal  humerus fracture with comminution and minimal displacement-4 weeks from injury, progressing well with early callus formation on radiographs    PLAN:  Patient was seen and examined in office today with husband present.  We discussed her left proximal humerus fracture.  We believe she is progressing as expected.  Her pain level continues to diminish.  She has been compliant with sling immobilization.  We discussed today that she is encouraged to start weaning out of the sling during the day over the next few weeks.  Advised to continue use at nighttime and when out in public, however it will be beneficial to start engaging some of the postural muscles while not relying on the sling.  This should also help with decreasing edema in the distal left upper extremity. She continues to work on range of motion of the elbow, wrist and hands.  Physical therapy initial evaluation has been completed.  They will begin gentle passive forward flexion and external rotation within the next few weeks as well as postural exercises.  Her imaging in office today continues to demonstrate appropriate alignment as well as early bridging callus at the fracture sites.  Reassurance was provided for the patient today.  Encouraged her to pursue outside activities such as getting her hair done and going to birthday parties as we believe that will benefit her mental wellbeing.  We will plan on seeing her back in 2 weeks for repeat evaluation and radiographs.   All questions and concerns were addressed to the best of my ability.  Radiographs to be obtained at next visit: X-ray left shoulder 3 views, AP, Grashey, Scapular Y  Follow-up in 2 weeks   Arlyss Schneider, DO Orthopedic Surgery & Sports Medicine [facility]

## 2024-05-17 DIAGNOSIS — R2689 Other abnormalities of gait and mobility: Secondary | ICD-10-CM | POA: Diagnosis not present

## 2024-05-17 DIAGNOSIS — M25512 Pain in left shoulder: Secondary | ICD-10-CM | POA: Diagnosis not present

## 2024-05-21 DIAGNOSIS — L821 Other seborrheic keratosis: Secondary | ICD-10-CM | POA: Diagnosis not present

## 2024-05-21 DIAGNOSIS — L858 Other specified epidermal thickening: Secondary | ICD-10-CM | POA: Diagnosis not present

## 2024-05-21 DIAGNOSIS — L570 Actinic keratosis: Secondary | ICD-10-CM | POA: Diagnosis not present

## 2024-05-21 DIAGNOSIS — R2689 Other abnormalities of gait and mobility: Secondary | ICD-10-CM | POA: Diagnosis not present

## 2024-05-21 DIAGNOSIS — M25512 Pain in left shoulder: Secondary | ICD-10-CM | POA: Diagnosis not present

## 2024-05-24 DIAGNOSIS — R2689 Other abnormalities of gait and mobility: Secondary | ICD-10-CM | POA: Diagnosis not present

## 2024-05-24 DIAGNOSIS — M25512 Pain in left shoulder: Secondary | ICD-10-CM | POA: Diagnosis not present

## 2024-05-28 ENCOUNTER — Encounter: Payer: Self-pay | Admitting: Radiology

## 2024-05-28 DIAGNOSIS — R2689 Other abnormalities of gait and mobility: Secondary | ICD-10-CM | POA: Diagnosis not present

## 2024-05-28 DIAGNOSIS — M25512 Pain in left shoulder: Secondary | ICD-10-CM | POA: Diagnosis not present

## 2024-05-29 ENCOUNTER — Ambulatory Visit

## 2024-05-30 ENCOUNTER — Ambulatory Visit

## 2024-05-30 DIAGNOSIS — S42292A Other displaced fracture of upper end of left humerus, initial encounter for closed fracture: Secondary | ICD-10-CM

## 2024-05-30 DIAGNOSIS — S42292D Other displaced fracture of upper end of left humerus, subsequent encounter for fracture with routine healing: Secondary | ICD-10-CM | POA: Diagnosis not present

## 2024-05-30 NOTE — Patient Instructions (Signed)

## 2024-05-30 NOTE — Progress Notes (Signed)
 Orthopedic Follow-Up Note  Closed displaced fracture of proximal end of left humerus   SUBJECTIVE:   Cheryl Joseph is a 83 y.o. year old who presents for follow-up of closed displaced fracture of proximal end of left humerus.  Date of injury 04/18/2024.  Originally seen at [facility] ED.  Seen by [facility] on following day 04/19/2024, 05/02/2024 and 05/16/2024.  Patient is currently being managed with sling for immobilization prn (advise at last appointment was gradual discontinuation of use), OTC Tylenol  for pain prn, and has started PT.   Patient today is 6 weeks status post fall and fracture.  Overall, patient reports she is seeing good progress.  Her pain is well-controlled.  She has been out of the sling while at home.  She still reports use of sling when sleeping and out in public.  She continues to work with physical therapy, addressing range of motion at this time.  No new complaints today.  Denies increasing pain, numbness in the fingers.  Patient accompanied to today's appointment by [facility], husband.     Past Medical History:  Diagnosis Date   Anginal pain    Arthritis    RA hands(seronegative)   Asthma    Back pain    Cancer (HCC)    skin   Cataract    HPV in female 1996   HPV with colposcopy (all neg paps since)   Hyperlipidemia    Hypertension    Incarcerated ventral hernia    Obesity    Osteopenia    Pre-diabetes    Shortness of breath dyspnea    Shoulder pain    Past Surgical History:  Procedure Laterality Date   CATARACT EXTRACTION, BILATERAL Bilateral    COLONOSCOPY  06/01/2004   COLONOSCOPY WITH PROPOFOL  N/A 05/01/2015   Procedure: COLONOSCOPY WITH PROPOFOL ;  Surgeon: Lamar ONEIDA Holmes, MD;  Location: [facility] ENDOSCOPY;  Service: Endoscopy;  Laterality: N/A;   CORRECTION HAMMER TOE Right    2nd toe   JOINT REPLACEMENT Right    right knee replacement January 2021   SQUAMOUS CELL CARCINOMA EXCISION Left 12/2017   left arm   TONSILLECTOMY     VAGINAL  HYSTERECTOMY  2015   with pelvic organ prolapse surgery    XI ROBOTIC ASSISTED VENTRAL HERNIA N/A 06/10/2021   Procedure: XI ROBOTIC ASSISTED VENTRAL HERNIA;  Surgeon: Rodolph Romano, MD;  Location: [facility] ORS;  Service: General;  Laterality: N/A;    Current Outpatient Medications:    albuterol  (VENTOLIN  HFA) 108 (90 Base) MCG/ACT inhaler, INHALE TWO PUFFS BY MOUTH EVERY 4 HOURS AS NEEDED FOR WHEEZING AND 2 PUFFS BEFORE EXERCISE OR EXPOSURE TO COLD AIR, Disp: 18 each, Rfl: 3   amLODipine  (NORVASC ) 5 MG tablet, Take 1 tablet (5 mg total) by mouth daily., Disp: 90 tablet, Rfl: 3   aspirin  EC 81 MG tablet, Take 1 tablet (81 mg total) by mouth daily. Swallow whole., Disp: , Rfl:    b complex vitamins tablet, Take 1 tablet by mouth every Monday, Wednesday, and Friday., Disp: , Rfl:    benzonatate  (TESSALON ) 200 MG capsule, Take 1 capsule (200 mg total) by mouth 3 (three) times daily as needed for cough. Swallow whole, Disp: 30 capsule, Rfl: 1   celecoxib (CELEBREX) 100 MG capsule, Take 1 capsule by mouth daily as needed., Disp: , Rfl:    Cholecalciferol (VITAMIN D3 PO), Take 2 tablets by mouth daily., Disp: , Rfl:    Hyaluronic Acid-Vitamin C (HYALURONIC ACID PO), Take 1 tablet by mouth  every Monday, Wednesday, and Friday., Disp: , Rfl:    HYDROcodone -acetaminophen  (NORCO) 10-325 MG tablet, Take 1 tablet by mouth every 6 (six) hours as needed., Disp: 30 tablet, Rfl: 0   Melatonin 1 MG CAPS, Take by mouth., Disp: , Rfl:    melatonin 1 MG TABS tablet, Take 1 mg by mouth., Disp: , Rfl:    meloxicam (MOBIC) 7.5 MG tablet, Take 1 tablet by mouth daily., Disp: , Rfl:    montelukast  (SINGULAIR ) 10 MG tablet, Take 1 tablet (10 mg total) by mouth at bedtime., Disp: 90 tablet, Rfl: 3   Multiple Vitamin (MULTIVITAMIN) capsule, Take 1 capsule by mouth daily. Reported on 12/18/2015, Disp: , Rfl:    Omega-3 Fatty Acids (FISH OIL PO), Take 1 capsule by mouth daily. Reported on 12/18/2015, Disp: , Rfl:    OVER  THE COUNTER MEDICATION, Take 2 capsules by mouth daily. BONE UP, Disp: , Rfl:    oxybutynin  (OXYTROL ) 3.9 MG/24HR, Place 1 patch onto the skin once a week. Sunday.  Home med., Disp: , Rfl:    Probiotic Product (PROBIOTIC DAILY PO), Take 1 tablet by mouth daily., Disp: , Rfl:    Pumpkin Seed-Soy Germ (AZO BLADDER CONTROL/GO-LESS PO), Take 1 capsule by mouth at bedtime., Disp: , Rfl:    rosuvastatin  (CRESTOR ) 20 MG tablet, Take 1 tablet (20 mg total) by mouth every other day. Takes 3 days per week, Disp: 45 tablet, Rfl: 3   simvastatin  (ZOCOR ) 40 MG tablet, Take 1 tablet (40 mg total) by mouth every other day., Disp: 45 tablet, Rfl: 3   triamcinolone  cream (KENALOG ) 0.1 %, Apply 1 Application topically 2 (two) times daily. To affected (rash) area, Disp: 30 g, Rfl: 0   TURMERIC PO, Take 720 mg by mouth every Monday, Wednesday, and Friday., Disp: , Rfl:    Ubiquinol 100 MG CAPS, Take 1 capsule by mouth every Monday, Wednesday, and Friday., Disp: , Rfl:  Allergies  Allergen Reactions   Atorvastatin      Headache    Atorvastatin  Calcium  Other (See Comments)    atorvastatin  calcium    Solifenacin Succinate Other (See Comments)    solifenacin   Tolterodine Tartrate Other (See Comments)    tolterodine   Zetia  [Ezetimibe ]     Headache    Social History   Socioeconomic History   Marital status: Married    Spouse name: Bennet   Number of children: 3   Years of education: Not on file   Highest education level: Not on file  Occupational History   Not on file  Tobacco Use   Smoking status: Never   Smokeless tobacco: Never  Vaping Use   Vaping status: Never Used  Substance and Sexual Activity   Alcohol use: No    Alcohol/week: 0.0 standard drinks of alcohol   Drug use: No   Sexual activity: Yes  Other Topics Concern   Not on file  Social History Narrative   Remarried.   19 grandchildren between pt and husband.   7 great grandchildren.   Social Drivers of Corporate Investment Banker  Strain: Low Risk  (10/27/2023)   Overall Financial Resource Strain (CARDIA)    Difficulty of Paying Living Expenses: Not hard at all  Food Insecurity: No Food Insecurity (10/27/2023)   Hunger Vital Sign    Worried About Running Out of Food in the Last Year: Never true    Ran Out of Food in the Last Year: Never true  Transportation Needs: No Transportation Needs (10/27/2023)  PRAPARE - Administrator, Civil Service (Medical): No    Lack of Transportation (Non-Medical): No  Physical Activity: Sufficiently Active (10/27/2023)   Exercise Vital Sign    Days of Exercise per Week: 5 days    Minutes of Exercise per Session: 30 min  Stress: No Stress Concern Present (10/27/2023)   [employer] of Occupational Health - Occupational Stress Questionnaire    Feeling of Stress : Not at all  Social Connections: Socially Integrated (10/27/2023)   Social Connection and Isolation Panel    Frequency of Communication with Friends and Family: More than three times a week    Frequency of Social Gatherings with Friends and Family: More than three times a week    Attends Religious Services: More than 4 times per year    Active Member of [employer] or Organizations: Yes    Attends Engineer, Structural: More than 4 times per year    Marital Status: Married  Catering Manager Violence: Not At Risk (10/27/2023)   Humiliation, Afraid, Rape, and Kick questionnaire    Fear of Current or Ex-Partner: No    Emotionally Abused: No    Physically Abused: No    Sexually Abused: No   Family History  Problem Relation Age of Onset   Diabetes Mother    Hyperlipidemia Mother    Heart disease Mother    Hypertension Mother    Obesity Mother    Kyphosis Mother    Heart disease Father    Hypertension Father    Cancer Father        lung Cancer smoker   Diabetes Daughter    Kyphosis Sister    Breast cancer Neg Hx      ROS: A review of systems was performed and is negative unless stated above in HPI     OBJECTIVE:    Constitutional:   The patient is alert and oriented x 3, appears to be stated age and in no distress.   Orthopaedic Examination:  Left upper extremity: Sling intact about the left upper extremity-removed for examination.  Skin is intact about the shoulder and distal about the arm.  Minimal ecchymosis noted at the antecubital fossa and distally about the forearm and into the thumb-improved overall from previous examination.  Nontender to palpation about the proximal arm including the shoulder region.  Sensation intact over the axillary nerve distribution of the lateral deltoid.  Sensation intact about the medial, ulnar, radial nerve distributions of the hand.  Elbow range of motion within normal limits.  Wrist and hand motion within normal limits.  Patient able to tolerate gentle forward flexion and external rotation of the shoulder with passive assist.  Edema noted in the right thumb.  Nontender to palpation over the distal phalanx, IP joint and proximal phalanx.    IMAGING:   Left shoulder xray 3-views (AP, Grashey, Scapular Y) Left shoulder xray obtained today 05/30/2024 at [facility]. Per my interpretation, proximal humerus fracture redemonstrated with minimally displaced greater tuberosity fragment as well as surgical neck fracture.  Bridging callus noted on multiple views across the fracture sites.  Overall alignment unchanged. In comparison to previous radiographs taken from the ED on 04/18/2024 and in office on 05/02/2024 and 05/16/2024, there is no interval displacement of the fracture fragments.  Radiographic findings reviewed with patient     ASSESSMENT:  Left proximal humerus fracture with comminution and minimal displacement- 6 weeks from injury, progressing well with callus formation on radiographs  PLAN:   Patient was seen in office today for follow up of left proximal humerus fracture.  She is approximately 6 weeks  status post injury.  Overall, she continues to progress well.  Pain is manageable.  She is slowly starting to wean out of her sling, especially at home.  She still feels that she needs to wear it when in public and at nighttime.  Encouraged continued weaning over the next few weeks.  Radiographs demonstrate unchanged alignment with bridging callus formation at the fracture sites.  She is working with physical therapy addressing range of motion and stretching at this time.  She is overall optimistic about her improvement to date.  Reassurance was provided to patient today.  Continued to encourage her to pursue outside activities to help with her mental wellbeing.  We will plan on seeing her back in 4 weeks for repeat evaluation and radiographs.  All questions and concerns were addressed to the best of my ability.   Arlyss Schneider, DO Orthopedic Surgery & Sports Medicine [facility]

## 2024-05-31 DIAGNOSIS — R2689 Other abnormalities of gait and mobility: Secondary | ICD-10-CM | POA: Diagnosis not present

## 2024-05-31 DIAGNOSIS — M25512 Pain in left shoulder: Secondary | ICD-10-CM | POA: Diagnosis not present

## 2024-06-04 DIAGNOSIS — R2689 Other abnormalities of gait and mobility: Secondary | ICD-10-CM | POA: Diagnosis not present

## 2024-06-04 DIAGNOSIS — M25512 Pain in left shoulder: Secondary | ICD-10-CM | POA: Diagnosis not present

## 2024-06-05 ENCOUNTER — Ambulatory Visit

## 2024-06-05 DIAGNOSIS — Z23 Encounter for immunization: Secondary | ICD-10-CM

## 2024-06-05 NOTE — Progress Notes (Signed)
 Per orders of Dr. Laine Balls, injection of High Dose Flu Vaccine given by Nellie Hummer in right deltoid. Patient tolerated injection well.

## 2024-06-07 ENCOUNTER — Ambulatory Visit: Payer: Self-pay

## 2024-06-07 DIAGNOSIS — M25512 Pain in left shoulder: Secondary | ICD-10-CM | POA: Diagnosis not present

## 2024-06-07 DIAGNOSIS — R2689 Other abnormalities of gait and mobility: Secondary | ICD-10-CM | POA: Diagnosis not present

## 2024-06-07 NOTE — Telephone Encounter (Signed)
 Aware, will watch for correspondence Thanks for seeing her

## 2024-06-07 NOTE — Telephone Encounter (Signed)
 FYI Only or Action Required?: FYI only for provider: appointment scheduled on 06/08/24.  Patient was last seen in primary care on 01/31/2024 by Jimmy Charlie FERNS, MD.  Called Nurse Triage reporting Blister.  Symptoms began yesterday.  Interventions attempted: Nothing.  Symptoms are: unchanged.  Triage Disposition: See Today or Tomorrow in Office (overriding Home Care)  Patient/caregiver understands and will follow disposition?: Yes  Reason for Disposition  1 or 2 small sores  Answer Assessment - Initial Assessment Questions States she has an oozing blister on the inside of her upper left arm. Pt has been wearing sling, wears a sling on that arm but is not in contact with effected skin. Just noticed it this morning. Did have some clear oozing this morning, now has a bandaid and does not appear to be oozing.   Patient also mentioned generally feeling fatigued and sounded sad. Pt questioning if it may be r/t PT or d/t the stress of limitations with her dominant hand.   1. APPEARANCE of SORES: What do the sores look like?     Blister like  2. NUMBER: How many sores are there?     One  3. SIZE: How big is the largest sore?     Smaller than a dime  4. LOCATION: Where are the sores located?     Inside of upper left arm  5. ONSET: When did the sores begin?     This morning  6. TENDER: Does it hurt when you touch it?  (Scale 1-10; or mild, moderate, severe)      Denies  Protocols used: [facility] Copied from CRM #[id number]. Topic: Clinical - Red Word Triage >> Jun 07, 2024  3:50 PM Willma R wrote: [facility] that prompted transfer to Nurse Triage: Patient has a wound/blister on inside of her left arm. Noticed it this morning and it was leaking when she went to PT. Its orange/gold in color and itches.

## 2024-06-08 ENCOUNTER — Ambulatory Visit: Admitting: Family Medicine

## 2024-06-08 ENCOUNTER — Encounter: Payer: Self-pay | Admitting: Family Medicine

## 2024-06-08 VITALS — BP 135/54 | HR 69 | Temp 97.7°F | Ht 64.0 in | Wt 202.0 lb

## 2024-06-08 DIAGNOSIS — S40822A Blister (nonthermal) of left upper arm, initial encounter: Secondary | ICD-10-CM | POA: Diagnosis not present

## 2024-06-08 DIAGNOSIS — R7303 Prediabetes: Secondary | ICD-10-CM | POA: Diagnosis not present

## 2024-06-08 DIAGNOSIS — T148XXA Other injury of unspecified body region, initial encounter: Secondary | ICD-10-CM

## 2024-06-08 MED ORDER — BACITRACIN 500 UNIT/GM EX OINT: 1.0000 | TOPICAL_OINTMENT | Freq: Two times a day (BID) | CUTANEOUS | 0 refills | Status: AC

## 2024-06-08 NOTE — Progress Notes (Signed)
 Acute Office Visit  Introduced to nurse practitioner role and practice setting.  All questions answered.  Discussed provider/patient relationship and expectations.   Subjective:     Patient ID: Cheryl Joseph, female    DOB: [birth year], 83 y.o.   MRN: [id number]  Chief Complaint  Patient presents with   Acute Visit    Patient is here today because she has a blister on her left arm, just seen it yesterday morning when she was getting dressed.  States that it doesn't bother her or hurt.    Discussed the use of AI scribe software for clinical note transcription with the patient, who gave verbal consent to proceed.  History of Present Illness [name] is an 83 year old female who presents with a blister on her arm.  She noticed a blister on her arm yesterday morning while preparing for physical therapy. Initially, it was oozing but has since stopped. The blister is not painful, and she has not experienced any recent fevers, though she occasionally has chills. No difficulty breathing, shortness of breath, or heart palpitations. She has never had a blister like this before, except for those caused by shoe rubbing on her feet. She is concerned about the blister.  She broke her left humerus in late September after tripping over a plant stand while watering flowers. Since the injury, she has been wearing a sling, primarily at night and when going out. She is left-handed and has been adapting to using her right hand for daily activities. She attends physical therapy twice a week and is not currently experiencing any pain in her arm aside from the fracture-related discomfort.  She has not had any changes in her medications recently and has no history of shingles. She has received both shingles vaccinations, which made her feel unwell at the time. She is prediabetic with a hemoglobin A1c of 6.3 and is mindful of her diet, particularly her protein intake, to aid in healing her broken  arm.  HPI  ROS      Objective:    BP (!) 135/54 (BP Location: Right Arm, Patient Position: Sitting, Cuff Size: Large)   Pulse 69   Temp 97.7 F (36.5 C) (Oral)   Ht 5' 4 (1.626 m)   Wt 202 lb (91.6 kg)   SpO2 97%   BMI 34.67 kg/m    Physical Exam Constitutional:      General: She is not in acute distress.    Appearance: Normal appearance. She is not ill-appearing, toxic-appearing or diaphoretic.  HENT:     Head: Normocephalic.     Mouth/Throat:     Mouth: Mucous membranes are moist.  Eyes:     Extraocular Movements: Extraocular movements intact.     Pupils: Pupils are equal, round, and reactive to light.  Cardiovascular:     Rate and Rhythm: Normal rate and regular rhythm.     Pulses: Normal pulses.     Heart sounds: Normal heart sounds. No murmur heard.    No friction rub. No gallop.  Pulmonary:     Effort: No respiratory distress.     Breath sounds: No stridor. No wheezing, rhonchi or rales.  Chest:     Chest wall: No tenderness.  Musculoskeletal:     Left shoulder: Tenderness present. Decreased range of motion. Normal strength. Normal pulse.     Left upper arm: Edema present.     Left elbow: Normal range of motion.     Right hand: Normal.  Right lower leg: No edema.     Left lower leg: No edema.     Comments: Tenderness and reduced ROM due to L humerus, known fracture. Scant edema localized to medial aspect of lower upper arm where blister is located  Skin:    General: Skin is warm and dry.     Capillary Refill: Capillary refill takes less than 2 seconds.  Neurological:     General: No focal deficit present.     Mental Status: She is alert and oriented to person, place, and time. Mental status is at baseline.  Psychiatric:        Mood and Affect: Mood normal.        Behavior: Behavior normal.        Thought Content: Thought content normal.        Judgment: Judgment normal.     No results found for any visits on 06/08/24.      Assessment &  Plan:  Assessment and Plan Assessment & Plan Blister of left upper arm - approx 1cm in size. Present for one day. - likely due to friction and immobility from wearing a sling.  - scant edema to area, most likely dependent due to reduce mobilty of arm, L humerus fracture - No signs of infection, does not look herpetic in nature, only one blister, non-painful.  - Provider cleansed area with iodine swab and Drained the blister with sterile 18g needle - per patient's request tolerated well. Applied gauze and bandage on top of blister. - Advised to keep the area covered for a couple of days to reduce friction. - elevate arm and continue PT exercises as tolerated for arm - prescribed bacitracin ointment to affected area BID for one week to reduce risk of infection - Instructed to monitor for signs of infection such as increased redness, swelling, or warmth, fevers, changes in skin color- Advised to follow up if signs of infection develop. - pt is UTD on shingles vax  Prediabetes - Pt brings up diagnosis during visit - Recent HbA1c of 6.3%.  - Discussed the importance of monitoring blood glucose levels and maintaining a healthy diet. - Maintain a healthy diet with appropriate protein intake.  Problem List Items Addressed This Visit       Other   Prediabetes   Other Visit Diagnoses       Blister    -  Primary   Relevant Medications   bacitracin 500 UNIT/GM ointment       Meds ordered this encounter  Medications   bacitracin 500 UNIT/GM ointment    Sig: Apply 1 Application topically 2 (two) times daily. To affected blister    Dispense:  15 g    Refill:  0    Return if symptoms worsen or fail to improve.  Curtis DELENA Boom, FNP  I, Curtis DELENA Boom, FNP, have reviewed all documentation for this visit. The documentation on 06/08/24 for the exam, diagnosis, procedures, and orders are all accurate and complete.

## 2024-06-11 DIAGNOSIS — M25512 Pain in left shoulder: Secondary | ICD-10-CM | POA: Diagnosis not present

## 2024-06-11 DIAGNOSIS — R2689 Other abnormalities of gait and mobility: Secondary | ICD-10-CM | POA: Diagnosis not present

## 2024-06-14 DIAGNOSIS — R2689 Other abnormalities of gait and mobility: Secondary | ICD-10-CM | POA: Diagnosis not present

## 2024-06-14 DIAGNOSIS — M25512 Pain in left shoulder: Secondary | ICD-10-CM | POA: Diagnosis not present

## 2024-06-18 DIAGNOSIS — M25512 Pain in left shoulder: Secondary | ICD-10-CM | POA: Diagnosis not present

## 2024-06-18 DIAGNOSIS — R2689 Other abnormalities of gait and mobility: Secondary | ICD-10-CM | POA: Diagnosis not present

## 2024-06-19 DIAGNOSIS — M25512 Pain in left shoulder: Secondary | ICD-10-CM | POA: Diagnosis not present

## 2024-06-19 DIAGNOSIS — R2689 Other abnormalities of gait and mobility: Secondary | ICD-10-CM | POA: Diagnosis not present

## 2024-06-25 DIAGNOSIS — M25512 Pain in left shoulder: Secondary | ICD-10-CM | POA: Diagnosis not present

## 2024-06-25 DIAGNOSIS — R2689 Other abnormalities of gait and mobility: Secondary | ICD-10-CM | POA: Diagnosis not present

## 2024-06-27 ENCOUNTER — Ambulatory Visit

## 2024-06-27 DIAGNOSIS — S42202D Unspecified fracture of upper end of left humerus, subsequent encounter for fracture with routine healing: Secondary | ICD-10-CM

## 2024-06-27 DIAGNOSIS — S42292A Other displaced fracture of upper end of left humerus, initial encounter for closed fracture: Secondary | ICD-10-CM

## 2024-06-27 DIAGNOSIS — S42292D Other displaced fracture of upper end of left humerus, subsequent encounter for fracture with routine healing: Secondary | ICD-10-CM | POA: Diagnosis not present

## 2024-06-27 NOTE — Progress Notes (Signed)
 Orthopedic Follow-Up Note  Nonoperative management of left proximal humerus fracture-10 weeks status post injury   SUBJECTIVE:   Cheryl Joseph is a 83 y.o. year old who presents for follow-up of closed, displaced proximal humerus fracture.  Date of injury 04/18/2024.  Originally seen at [facility] ED.  Seen by [facility] on following days 04/19/2024, 05/02/2024, 05/16/2024, and 05/30/2024 for fracture surveillance.    Patient is now 10 weeks status post injury.  Overall, patient reports she is seeing good progress.  Patient is happy to report she has very minimal pain at this time.  She has completely eliminated sling use except for sleeping at night and when out in public.  She is still sleeping in her recliner.  She continues to work with physical therapy ([facility] in [place]), addressing passive range of motion at this time, started isometrics earlier this week.  She does have some tightness in the shoulder with therapy but no significant pain.  Patient also has been working on home exercises in addition to formal physical therapy.  No new complaints today.  Denies increasing pain, numbness in the fingers.  Patient reports she did have a blister on her left inner arm several weeks ago but has healed without issue.  Patient feels much more confident using the left arm within the confines of her injury.  She has been returning to activities of daily living.  Patient accompanied to today's appointment by [facility], husband.     Past Medical History:  Diagnosis Date   Anginal pain    Arthritis    RA hands(seronegative)   Asthma    Back pain    Cancer (HCC)    skin   Cataract    HPV in female 1996   HPV with colposcopy (all neg paps since)   Hyperlipidemia    Hypertension    Incarcerated ventral hernia    Obesity    Osteopenia    Pre-diabetes    Shortness of breath dyspnea    Shoulder pain    Past Surgical History:  Procedure Laterality Date   CATARACT  EXTRACTION, BILATERAL Bilateral    COLONOSCOPY  06/01/2004   COLONOSCOPY WITH PROPOFOL  N/A 05/01/2015   Procedure: COLONOSCOPY WITH PROPOFOL ;  Surgeon: Lamar ONEIDA Holmes, MD;  Location: [facility] ENDOSCOPY;  Service: Endoscopy;  Laterality: N/A;   CORRECTION HAMMER TOE Right    2nd toe   JOINT REPLACEMENT Right    right knee replacement January 2021   SQUAMOUS CELL CARCINOMA EXCISION Left 12/2017   left arm   TONSILLECTOMY     VAGINAL HYSTERECTOMY  2015   with pelvic organ prolapse surgery    XI ROBOTIC ASSISTED VENTRAL HERNIA N/A 06/10/2021   Procedure: XI ROBOTIC ASSISTED VENTRAL HERNIA;  Surgeon: Rodolph Romano, MD;  Location: [facility] ORS;  Service: General;  Laterality: N/A;    Current Outpatient Medications:    albuterol  (VENTOLIN  HFA) 108 (90 Base) MCG/ACT inhaler, INHALE TWO PUFFS BY MOUTH EVERY 4 HOURS AS NEEDED FOR WHEEZING AND 2 PUFFS BEFORE EXERCISE OR EXPOSURE TO COLD AIR, Disp: 18 each, Rfl: 3   amLODipine  (NORVASC ) 5 MG tablet, Take 1 tablet (5 mg total) by mouth daily., Disp: 90 tablet, Rfl: 3   aspirin  EC 81 MG tablet, Take 1 tablet (81 mg total) by mouth daily. Swallow whole., Disp: , Rfl:    b complex vitamins tablet, Take 1 tablet by mouth every Monday, Wednesday, and Friday., Disp: , Rfl:    bacitracin  500 UNIT/GM ointment, Apply 1  Application topically 2 (two) times daily. To affected blister, Disp: 15 g, Rfl: 0   benzonatate  (TESSALON ) 200 MG capsule, Take 1 capsule (200 mg total) by mouth 3 (three) times daily as needed for cough. Swallow whole, Disp: 30 capsule, Rfl: 1   celecoxib (CELEBREX) 100 MG capsule, Take 1 capsule by mouth daily as needed., Disp: , Rfl:    Cholecalciferol (VITAMIN D3 PO), Take 2 tablets by mouth daily., Disp: , Rfl:    Hyaluronic Acid-Vitamin C (HYALURONIC ACID PO), Take 1 tablet by mouth every Monday, Wednesday, and Friday., Disp: , Rfl:    HYDROcodone -acetaminophen  (NORCO) 10-325 MG tablet, Take 1 tablet by mouth every 6 (six) hours as  needed., Disp: 30 tablet, Rfl: 0   Melatonin 1 MG CAPS, Take by mouth., Disp: , Rfl:    melatonin 1 MG TABS tablet, Take 1 mg by mouth., Disp: , Rfl:    meloxicam (MOBIC) 7.5 MG tablet, Take 1 tablet by mouth daily., Disp: , Rfl:    montelukast  (SINGULAIR ) 10 MG tablet, Take 1 tablet (10 mg total) by mouth at bedtime., Disp: 90 tablet, Rfl: 3   Multiple Vitamin (MULTIVITAMIN) capsule, Take 1 capsule by mouth daily. Reported on 12/18/2015, Disp: , Rfl:    Omega-3 Fatty Acids (FISH OIL PO), Take 1 capsule by mouth daily. Reported on 12/18/2015, Disp: , Rfl:    OVER THE COUNTER MEDICATION, Take 2 capsules by mouth daily. BONE UP, Disp: , Rfl:    oxybutynin  (OXYTROL ) 3.9 MG/24HR, Place 1 patch onto the skin once a week. Sunday.  Home med., Disp: , Rfl:    Probiotic Product (PROBIOTIC DAILY PO), Take 1 tablet by mouth daily., Disp: , Rfl:    Pumpkin Seed-Soy Germ (AZO BLADDER CONTROL/GO-LESS PO), Take 1 capsule by mouth at bedtime., Disp: , Rfl:    rosuvastatin  (CRESTOR ) 20 MG tablet, Take 1 tablet (20 mg total) by mouth every other day. Takes 3 days per week, Disp: 45 tablet, Rfl: 3   simvastatin  (ZOCOR ) 40 MG tablet, Take 1 tablet (40 mg total) by mouth every other day., Disp: 45 tablet, Rfl: 3   triamcinolone  cream (KENALOG ) 0.1 %, Apply 1 Application topically 2 (two) times daily. To affected (rash) area, Disp: 30 g, Rfl: 0   TURMERIC PO, Take 720 mg by mouth every Monday, Wednesday, and Friday., Disp: , Rfl:    Ubiquinol 100 MG CAPS, Take 1 capsule by mouth every Monday, Wednesday, and Friday., Disp: , Rfl:  Allergies  Allergen Reactions   Atorvastatin      Headache    Atorvastatin  Calcium  Other (See Comments)    atorvastatin  calcium    Solifenacin Succinate Other (See Comments)    solifenacin   Tolterodine Tartrate Other (See Comments)    tolterodine   Zetia  [Ezetimibe ]     Headache    Social History   Socioeconomic History   Marital status: Married    Spouse name: Bennet   Number of  children: 3   Years of education: Not on file   Highest education level: Not on file  Occupational History   Not on file  Tobacco Use   Smoking status: Never   Smokeless tobacco: Never  Vaping Use   Vaping status: Never Used  Substance and Sexual Activity   Alcohol use: No    Alcohol/week: 0.0 standard drinks of alcohol   Drug use: No   Sexual activity: Yes  Other Topics Concern   Not on file  Social History Narrative   Remarried.  19 grandchildren between pt and husband.   7 great grandchildren.   Social Drivers of Corporate Investment Banker Strain: Low Risk  (10/27/2023)   Overall Financial Resource Strain (CARDIA)    Difficulty of Paying Living Expenses: Not hard at all  Food Insecurity: No Food Insecurity (10/27/2023)   Hunger Vital Sign    Worried About Running Out of Food in the Last Year: Never true    Ran Out of Food in the Last Year: Never true  Transportation Needs: No Transportation Needs (10/27/2023)   PRAPARE - Administrator, Civil Service (Medical): No    Lack of Transportation (Non-Medical): No  Physical Activity: Sufficiently Active (10/27/2023)   Exercise Vital Sign    Days of Exercise per Week: 5 days    Minutes of Exercise per Session: 30 min  Stress: No Stress Concern Present (10/27/2023)   [employer] of Occupational Health - Occupational Stress Questionnaire    Feeling of Stress : Not at all  Social Connections: Socially Integrated (10/27/2023)   Social Connection and Isolation Panel    Frequency of Communication with Friends and Family: More than three times a week    Frequency of Social Gatherings with Friends and Family: More than three times a week    Attends Religious Services: More than 4 times per year    Active Member of [employer] or Organizations: Yes    Attends Engineer, Structural: More than 4 times per year    Marital Status: Married  Catering Manager Violence: Not At Risk (10/27/2023)   Humiliation, Afraid, Rape, and  Kick questionnaire    Fear of Current or Ex-Partner: No    Emotionally Abused: No    Physically Abused: No    Sexually Abused: No   Family History  Problem Relation Age of Onset   Diabetes Mother    Hyperlipidemia Mother    Heart disease Mother    Hypertension Mother    Obesity Mother    Kyphosis Mother    Heart disease Father    Hypertension Father    Cancer Father        lung Cancer smoker   Diabetes Daughter    Kyphosis Sister    Breast cancer Neg Hx      ROS: A review of systems was performed and is negative unless stated above in HPI    OBJECTIVE:    Constitutional:   The patient is alert and oriented x 3, appears to be stated age and in no distress.   Orthopaedic Examination:   Left upper extremity: Sling intact about the left upper extremity-removed for examination.  Skin is intact about the shoulder and distally about the arm.  Prior blister on medial distal arm well-healed.  Very faint/resolving ecchymosis noted at the anterior upper arm and antecubital fossa-improved overall from previous examination.  Nontender to palpation about the proximal arm including the shoulder region.  Nontender about the elbow, wrist and hand.  Sensation intact over the axillary nerve distribution of the lateral deltoid.  Sensation intact about the medial, ulnar, radial nerve distributions of the hand.  Elbow range of motion within normal limits.  Wrist and hand motion within normal limits.  Patient able to tolerate passive gentle forward flexion to at least 90 degrees and external rotation of the shoulder to 30 degrees with no pain.    IMAGING:   Left shoulder xray 3-views (AP, Grashey, Scapular Y)  Left shoulder xray obtained today 06/27/2024 at [facility]  at [facility]. Per my interpretation, proximal humerus fracture redemonstrated with minimally displaced greater tuberosity fragment as well as surgical neck fracture.  Significant bridging callus noted on  multiple views across the fracture sites.  Overall alignment unchanged with near complete healing at all prior fracture sites. In comparison to previous radiographs taken from the ED on 04/18/2024 and in office on 05/02/2024, 05/16/2024, and 05/30/2024, there is no interval displacement of the fracture fragments.  Radiographic findings reviewed with patient     ASSESSMENT:  Nonoperative management of left proximal humerus fracture-10 weeks from injury.  Progressing well overall with physical therapy and radiographic healing       PLAN:   Patient was seen in office today for follow up of left proximal humerus fracture.  She is approximately 10 weeks status post injury.  Patient is doing very well overall.  She experiences minimal pain, mainly stiffness when working with physical therapy.  Patient has been diligently working on discontinuing use of sling.  Emphasized importance of discontinuing use in order to regain range of motion and confidence, as well as avoiding skin breakdown from its usage.  Encouraged patient to continue weaning from the sling completely, at night and when out in public.  Radiographs demonstrate unchanged overall alignment and significant bridging callous formation at the fracture sites.  She is working with physical therapy addressing range of motion and confidence building regarding healing of left shoulder.  Patient is still in the passive range of motion phase of therapy.  She will begin isometric work as well as active and active assist motion following the nonoperative proximal humerus protocol.  We believe she would certainly benefit from continued physical therapy into the new year.  She is overall pleased with her progress and in good spirits following the Thanksgiving holiday with family.  Advised continued work with physical therapy and spending time with friends and family, as it seems to improve her mental wellbeing.   We will plan for patient to follow-up in  office in two months (18 weeks s/p fall and fracture) for re-evaluation and repeat radiographs, sooner if any new/worsening symptoms or concerns.  All questions and concerns were addressed to the best of my ability.   I discussed with the patient today that I will be transitioning out of my role within the near future. In order to provide appropriate continuity of care, we offered the patient options for follow up regarding their orthopedic concerns. Patient has chosen to follow up with [facility].  Patient may reach out to our office if there are any difficulties in scheduling follow up care. The patient understands who to contact for future orthopedic concerns and has contact information for the receiving practice.    Arlyss Schneider, DO Orthopedic Surgery & Sports Medicine [facility]

## 2024-06-27 NOTE — Patient Instructions (Signed)

## 2024-06-28 DIAGNOSIS — R2689 Other abnormalities of gait and mobility: Secondary | ICD-10-CM | POA: Diagnosis not present

## 2024-06-28 DIAGNOSIS — M25512 Pain in left shoulder: Secondary | ICD-10-CM | POA: Diagnosis not present

## 2024-07-03 DIAGNOSIS — R2689 Other abnormalities of gait and mobility: Secondary | ICD-10-CM | POA: Diagnosis not present

## 2024-07-03 DIAGNOSIS — M25512 Pain in left shoulder: Secondary | ICD-10-CM | POA: Diagnosis not present

## 2024-07-12 ENCOUNTER — Telehealth: Payer: Self-pay | Admitting: Physician Assistant

## 2024-07-12 NOTE — Telephone Encounter (Signed)
 Pt called and ask if you could call her about her care. She has some  questions. She wants to know if she ok to sleep in her bed? [phone]

## 2024-07-12 NOTE — Telephone Encounter (Signed)
 I called pt. Per Dr Gust, its ok for pt to sleep in the bed again. OK to sleep in whatever position is comfortable. I advised pt of this and she stated understanding

## 2024-07-30 ENCOUNTER — Telehealth: Payer: Self-pay

## 2024-07-30 DIAGNOSIS — I10 Essential (primary) hypertension: Secondary | ICD-10-CM

## 2024-07-30 DIAGNOSIS — E785 Hyperlipidemia, unspecified: Secondary | ICD-10-CM

## 2024-07-30 DIAGNOSIS — E669 Obesity, unspecified: Secondary | ICD-10-CM

## 2024-07-30 DIAGNOSIS — R7303 Prediabetes: Secondary | ICD-10-CM

## 2024-07-30 NOTE — Addendum Note (Signed)
 Addended by: RANDEEN HARDY A on: 07/30/2024 08:04 PM   Modules accepted: Orders

## 2024-07-30 NOTE — Telephone Encounter (Signed)
 Copied from CRM [id number]. Topic: Clinical - Lab/Test Results >> Jul 30, 2024 11:14 AM Cheryl Joseph wrote: Reason for CRM: Patient scheduled a physical appointment for 11/19/24 and lab appointment for 11/12/24- please submit the lab orders for appointment

## 2024-07-30 NOTE — Telephone Encounter (Signed)
 Copied from CRM [id number]. Topic: Clinical - Lab/Test Results >> Jul 30, 2024 11:14 AM Tiffini S wrote: Reason for CRM: Patient scheduled a physical appointment for 11/19/24 and lab appointment for 11/12/24- please submit the lab orders for appointment

## 2024-08-02 ENCOUNTER — Ambulatory Visit: Admitting: Family Medicine

## 2024-08-02 ENCOUNTER — Encounter: Payer: Self-pay | Admitting: Family Medicine

## 2024-08-02 VITALS — BP 128/76 | HR 74 | Temp 98.0°F | Ht 64.0 in | Wt 203.5 lb

## 2024-08-02 DIAGNOSIS — M545 Low back pain, unspecified: Secondary | ICD-10-CM | POA: Insufficient documentation

## 2024-08-02 DIAGNOSIS — H6122 Impacted cerumen, left ear: Secondary | ICD-10-CM | POA: Diagnosis not present

## 2024-08-02 DIAGNOSIS — R8290 Unspecified abnormal findings in urine: Secondary | ICD-10-CM | POA: Diagnosis not present

## 2024-08-02 LAB — POC URINALSYSI DIPSTICK (AUTOMATED)
Bilirubin, UA: NEGATIVE
Blood, UA: 10 — AB
Glucose, UA: NEGATIVE
Ketones, UA: NEGATIVE
Leukocytes, UA: NEGATIVE
Nitrite, UA: NEGATIVE
Protein, UA: NEGATIVE
Spec Grav, UA: >=1.03 — AB
Urobilinogen, UA: 0.2 U/dL
pH, UA: 6

## 2024-08-02 LAB — POCT UA - MICROSCOPIC ONLY

## 2024-08-02 NOTE — Patient Instructions (Signed)
 Your ear has a wax impaction  Get debrox or other wax solution over the counter and use it every other day in the left ear (ok to use on the right -but less wax there)   Follow up here in approx 2 weeks and we will try and flush out the ear    If back pain returns- use a warm compress/heat for 10 minutes at a time and do some walking  If no improvement-come back   Urine does not look infected today  Please increase fluids/water -it is concentrated

## 2024-08-02 NOTE — Progress Notes (Signed)
 "  Subjective:    Patient ID: Cheryl Joseph, female    DOB: 07/09/1941, 84 y.o.   MRN: [id number]  HPI  Wt Readings from Last 3 Encounters:  08/02/24 203 lb 8 oz (92.3 kg)  06/08/24 202 lb (91.6 kg)  04/18/24 200 lb (90.7 kg)   34.93 kg/m  Vitals:   08/02/24 1227  BP: 128/76  Pulse: 74  Temp: 98 F (36.7 C)  SpO2: 97%   Pt presents for c/o Left ear pain  Low back pain    Last week-some itching of her left ear  Then it hurt to open jaw very wide  Today improved  No drainage  No decrease in hearing  No nasal congestion  No fever  Not prone to cerumen   Back pain Left low back  Dull and uncomfortable  Episodes last 2 h at most  Has had it before   Notes it sitting Or in the car  Cannot get comfortable   Tylenol  as needed  Has not used ice or heat   No dysuria  No visible blood in urine  Has overactive bladder     Urinalysis -trace blood but not on micro  Results for orders placed or performed in visit on 08/02/24  POCT Urinalysis Dipstick (Automated)   Collection Time: 08/02/24 12:43 PM  Result Value Ref Range   Color, UA Yellow    Clarity, UA Clear    Glucose, UA Negative Negative   Bilirubin, UA Negative    Ketones, UA Negative    Spec Grav, UA >=1.030 (A) 1.010 - 1.025   Blood, UA 10 Ery/uL (A)    pH, UA 6.0 5.0 - 8.0   Protein, UA Negative Negative   Urobilinogen, UA 0.2 0.2 or 1.0 E.U./dL   Nitrite, UA Negative    Leukocytes, UA Negative Negative  POCT UA - Microscopic Only   Collection Time: 08/02/24 12:58 PM  Result Value Ref Range   WBC, Ur, HPF, POC 0-1 0 - 5   RBC, Urine, Miroscopic none 0 - 2   Bacteria, U Microscopic none None - Trace   Mucus, UA few    Epithelial cells, urine per micros many    Crystals, Ur, HPF, POC few    Casts, Ur, LPF, POC none    Yeast, UA none      Did fracture left arm in the fall  Lab Results  Component Value Date   ALT 11 11/03/2023   AST 17 11/03/2023   ALKPHOS 83 11/03/2023   BILITOT  0.7 11/03/2023     Patient Active Problem List   Diagnosis Date Noted   Abnormal finding on urinalysis 08/02/2024   Low back pain 08/02/2024   Diarrhea 01/31/2024   Rash of neck 10/11/2023   Pelvic prolapse 07/22/2023   Pain of right thumb 02/16/2023   Encounter for screening mammogram for breast cancer 12/28/2022   Cerumen impaction 12/28/2022   Chronic headache 02/25/2022   Snoring 02/25/2022   History of stroke 12/31/2021   Umbilical hernia 01/05/2021   Pre-operative general physical examination 07/06/2019   Estrogen deficiency 05/25/2016   Routine general medical examination at a health care facility 11/23/2015   Hammertoe 10/28/2015   Diverticulosis of colon without hemorrhage 02/14/2015   Colon cancer screening 08/19/2014   Essential hypertension 02/25/2014   Encounter for Medicare annual wellness exam 07/16/2013   Prediabetes 05/14/2009   Osteopenia 03/07/2007   Hyperlipidemia 01/03/2007   Obesity (BMI 30-39.9) 01/03/2007   ALLERGIC RHINITIS, SEASONAL  01/03/2007   Asthma 01/03/2007   OVERACTIVE BLADDER 01/03/2007   Past Medical History:  Diagnosis Date   Anginal pain    Arthritis    RA hands(seronegative)   Asthma    Back pain    Cancer (HCC)    skin   Cataract    HPV in female 1996   HPV with colposcopy (all neg paps since)   Hyperlipidemia    Hypertension    Incarcerated ventral hernia    Obesity    Osteopenia    Pre-diabetes    Shortness of breath dyspnea    Shoulder pain    Past Surgical History:  Procedure Laterality Date   CATARACT EXTRACTION, BILATERAL Bilateral    COLONOSCOPY  06/01/2004   COLONOSCOPY WITH PROPOFOL  N/A 05/01/2015   Procedure: COLONOSCOPY WITH PROPOFOL ;  Surgeon: Lamar ONEIDA Holmes, MD;  Location: [facility] ENDOSCOPY;  Service: Endoscopy;  Laterality: N/A;   CORRECTION HAMMER TOE Right    2nd toe   JOINT REPLACEMENT Right    right knee replacement January 2021   SQUAMOUS CELL CARCINOMA EXCISION Left 12/2017   left arm    TONSILLECTOMY     VAGINAL HYSTERECTOMY  2015   with pelvic organ prolapse surgery    XI ROBOTIC ASSISTED VENTRAL HERNIA N/A 06/10/2021   Procedure: XI ROBOTIC ASSISTED VENTRAL HERNIA;  Surgeon: Rodolph Romano, MD;  Location: [facility] ORS;  Service: General;  Laterality: N/A;   Social History[1] Family History  Problem Relation Age of Onset   Diabetes Mother    Hyperlipidemia Mother    Heart disease Mother    Hypertension Mother    Obesity Mother    Kyphosis Mother    Heart disease Father    Hypertension Father    Cancer Father        lung Cancer smoker   Diabetes Daughter    Kyphosis Sister    Breast cancer Neg Hx    Allergies[2] Medications Ordered Prior to Encounter[3]  Review of Systems  Constitutional:  Negative for chills and fever.  Respiratory:  Negative for cough.   Gastrointestinal:  Negative for abdominal pain and diarrhea.  Genitourinary:  Positive for urgency. Negative for dysuria, frequency, hematuria and pelvic pain.       Urgency is baseline from OAB  Musculoskeletal:  Positive for back pain.       Getting over LUE fracture this fall   Neurological:  Negative for weakness and numbness.       Objective:   Physical Exam Constitutional:      General: She is not in acute distress.    Appearance: Normal appearance. She is well-developed. She is obese. She is not ill-appearing or diaphoretic.  HENT:     Head: Normocephalic and atraumatic.     Comments: No temporal or TM joint tenderness     Right Ear: Tympanic membrane and external ear normal.     Left Ear: External ear normal. There is impacted cerumen.     Ears:     Comments: Dry cerumen impaction left canal No tenderness   Right canal-scant cerumen    Nose: No congestion or rhinorrhea.     Mouth/Throat:     Mouth: Mucous membranes are moist.     Pharynx: Oropharynx is clear.  Eyes:     General: No scleral icterus.       Right eye: No discharge.        Left eye: No discharge.      Conjunctiva/sclera: Conjunctivae normal.  Pupils: Pupils are equal, round, and reactive to light.  Neck:     Thyroid : No thyromegaly.     Vascular: No carotid bruit or JVD.  Cardiovascular:     Rate and Rhythm: Normal rate and regular rhythm.     Heart sounds: Normal heart sounds.     No gallop.  Pulmonary:     Effort: Pulmonary effort is normal. No respiratory distress.     Breath sounds: Normal breath sounds. No wheezing or rales.  Chest:     Chest wall: Tenderness present.  Abdominal:     General: There is no distension or abdominal bruit.     Palpations: Abdomen is soft.     Tenderness: There is no right CVA tenderness, left CVA tenderness, guarding or rebound.  Musculoskeletal:     Cervical back: Normal range of motion and neck supple. No tenderness.     Lumbar back: No swelling, deformity, tenderness or bony tenderness. Normal range of motion. Negative right straight leg raise test and negative left straight leg raise test. No scoliosis.     Right lower leg: No edema.     Left lower leg: No edema.     Comments: Some loss of lordosis in LS   Lymphadenopathy:     Cervical: No cervical adenopathy.  Skin:    General: Skin is warm and dry.     Coloration: Skin is not pale.     Findings: No rash.  Neurological:     Mental Status: She is alert.     Coordination: Coordination normal.     Deep Tendon Reflexes: Reflexes are normal and symmetric. Reflexes normal.  Psychiatric:        Mood and Affect: Mood normal.           Assessment & Plan:   Problem List Items Addressed This Visit       Nervous and Auditory   Cerumen impaction   Dry cerumen impaction primarily in left ear canal Unsure if this is cause of discomfort (is improved now)   Instructed to use debrox or similar product very other day and follow up 2 wk for re check and irrigation   Call if pain returns or other symptoms  Call back and Er precautions noted in detail today          Other   Low back  pain - Primary   Intermittent left low back pain (lower than cva)  Lasts 2 hours or less No known triggers  No radiation  Reassuring exam  Reassuring urinalysis (unlikely renal stone)   Instructed to use warm compress prn  Update if this returns /does not improve  Encouraged stretching/walking   Call back and Er precautions noted in detail today        Relevant Orders   POCT Urinalysis Dipstick (Automated) (Completed)   POCT UA - Microscopic Only (Completed)   Abnormal finding on urinalysis   Trace blood on dip- urine micro showed no rbc         Relevant Orders   POCT UA - Microscopic Only (Completed)      [1]  Social History Tobacco Use   Smoking status: Never   Smokeless tobacco: Never  Vaping Use   Vaping status: Never Used  Substance Use Topics   Alcohol use: No    Alcohol/week: 0.0 standard drinks of alcohol   Drug use: No  [2]  Allergies Allergen Reactions   Atorvastatin      Headache    Atorvastatin  Calcium   Other (See Comments)    atorvastatin  calcium    Solifenacin Succinate Other (See Comments)    solifenacin   Tolterodine Tartrate Other (See Comments)    tolterodine   Zetia  [Ezetimibe ]     Headache   [3]  Current Outpatient Medications on File Prior to Visit  Medication Sig Dispense Refill   albuterol  (VENTOLIN  HFA) 108 (90 Base) MCG/ACT inhaler INHALE TWO PUFFS BY MOUTH EVERY 4 HOURS AS NEEDED FOR WHEEZING AND 2 PUFFS BEFORE EXERCISE OR EXPOSURE TO COLD AIR 18 each 3   amLODipine  (NORVASC ) 5 MG tablet Take 1 tablet (5 mg total) by mouth daily. 90 tablet 3   aspirin  EC 81 MG tablet Take 1 tablet (81 mg total) by mouth daily. Swallow whole.     b complex vitamins tablet Take 1 tablet by mouth every Monday, Wednesday, and Friday.     bacitracin  500 UNIT/GM ointment Apply 1 Application topically 2 (two) times daily. To affected blister 15 g 0   celecoxib (CELEBREX) 100 MG capsule Take 1 capsule by mouth daily as needed.     Cholecalciferol (VITAMIN  D3 PO) Take 2 tablets by mouth daily.     Hyaluronic Acid-Vitamin C (HYALURONIC ACID PO) Take 1 tablet by mouth every Monday, Wednesday, and Friday.     HYDROcodone -acetaminophen  (NORCO) 10-325 MG tablet Take 1 tablet by mouth every 6 (six) hours as needed. 30 tablet 0   Melatonin 1 MG CAPS Take by mouth.     melatonin 1 MG TABS tablet Take 1 mg by mouth.     meloxicam (MOBIC) 7.5 MG tablet Take 1 tablet by mouth daily.     montelukast  (SINGULAIR ) 10 MG tablet Take 1 tablet (10 mg total) by mouth at bedtime. 90 tablet 3   Multiple Vitamin (MULTIVITAMIN) capsule Take 1 capsule by mouth daily. Reported on 12/18/2015     Omega-3 Fatty Acids (FISH OIL PO) Take 1 capsule by mouth daily. Reported on 12/18/2015     OVER THE COUNTER MEDICATION Take 2 capsules by mouth daily. BONE UP     oxybutynin  (OXYTROL ) 3.9 MG/24HR Place 1 patch onto the skin once a week. Sunday.  Home med.     Probiotic Product (PROBIOTIC DAILY PO) Take 1 tablet by mouth daily.     Pumpkin Seed-Soy Germ (AZO BLADDER CONTROL/GO-LESS PO) Take 1 capsule by mouth at bedtime.     rosuvastatin  (CRESTOR ) 20 MG tablet Take 1 tablet (20 mg total) by mouth every other day. Takes 3 days per week 45 tablet 3   simvastatin  (ZOCOR ) 40 MG tablet Take 1 tablet (40 mg total) by mouth every other day. 45 tablet 3   triamcinolone  cream (KENALOG ) 0.1 % Apply 1 Application topically 2 (two) times daily. To affected (rash) area 30 g 0   TURMERIC PO Take 720 mg by mouth every Monday, Wednesday, and Friday.     Ubiquinol 100 MG CAPS Take 1 capsule by mouth every Monday, Wednesday, and Friday.     No current facility-administered medications on file prior to visit.   "

## 2024-08-02 NOTE — Assessment & Plan Note (Signed)
 Intermittent left low back pain (lower than cva)  Lasts 2 hours or less No known triggers  No radiation  Reassuring exam  Reassuring urinalysis (unlikely renal stone)   Instructed to use warm compress prn  Update if this returns /does not improve  Encouraged stretching/walking   Call back and Er precautions noted in detail today

## 2024-08-02 NOTE — Assessment & Plan Note (Signed)
 Dry cerumen impaction primarily in left ear canal Unsure if this is cause of discomfort (is improved now)   Instructed to use debrox or similar product very other day and follow up 2 wk for re check and irrigation   Call if pain returns or other symptoms  Call back and Er precautions noted in detail today

## 2024-08-02 NOTE — Assessment & Plan Note (Signed)
 Trace blood on dip- urine micro showed no rbc

## 2024-08-16 ENCOUNTER — Ambulatory Visit: Admitting: Family Medicine

## 2024-08-16 ENCOUNTER — Encounter: Payer: Self-pay | Admitting: Family Medicine

## 2024-08-16 VITALS — BP 126/78 | HR 65 | Temp 98.1°F | Ht 64.0 in | Wt 205.4 lb

## 2024-08-16 DIAGNOSIS — H6122 Impacted cerumen, left ear: Secondary | ICD-10-CM | POA: Diagnosis not present

## 2024-08-16 NOTE — Patient Instructions (Signed)
 Continue debrox in left ear to loosen ear wax   Use debrox in right ear twice monthly to keep it clear   I put the referral in for ENT (ear specialist appointment) to get your ear cleaned out  Please let us  know if you don't hear in 1-2 weeks to set that up (mychart message or call or letter)   If symptoms worsen in the meantime let us  know

## 2024-08-16 NOTE — Progress Notes (Signed)
 "  Subjective:    Patient ID: Cheryl Joseph, female    DOB: Mar 26, 1941, 84 y.o.   MRN: [id number]  HPI  Wt Readings from Last 3 Encounters:  08/16/24 205 lb 6 oz (93.2 kg)  08/02/24 203 lb 8 oz (92.3 kg)  06/08/24 202 lb (91.6 kg)   35.25 kg/m  Vitals:   08/16/24 1055  BP: 126/78  Pulse: 65  Temp: 98.1 F (36.7 C)  SpO2: 95%    Pt presents for follow up of  Cerumen impaction   Seen on 1/8 Noted dry cerumen impaction with hearing loss worse in left ear canal Some discomfort Was advised to start using debrox solution or similar    Using debrox  Ear does not hurt   Patient Active Problem List   Diagnosis Date Noted   Abnormal finding on urinalysis 08/02/2024   Low back pain 08/02/2024   Diarrhea 01/31/2024   Rash of neck 10/11/2023   Pelvic prolapse 07/22/2023   Pain of right thumb 02/16/2023   Encounter for screening mammogram for breast cancer 12/28/2022   Cerumen impaction 12/28/2022   Chronic headache 02/25/2022   Snoring 02/25/2022   History of stroke 12/31/2021   Umbilical hernia 01/05/2021   Pre-operative general physical examination 07/06/2019   Estrogen deficiency 05/25/2016   Routine general medical examination at a health care facility 11/23/2015   Hammertoe 10/28/2015   Diverticulosis of colon without hemorrhage 02/14/2015   Colon cancer screening 08/19/2014   Essential hypertension 02/25/2014   Encounter for Medicare annual wellness exam 07/16/2013   Prediabetes 05/14/2009   Osteopenia 03/07/2007   Hyperlipidemia 01/03/2007   Obesity (BMI 30-39.9) 01/03/2007   ALLERGIC RHINITIS, SEASONAL 01/03/2007   Asthma 01/03/2007   OVERACTIVE BLADDER 01/03/2007   Past Medical History:  Diagnosis Date   Anginal pain    Arthritis    RA hands(seronegative)   Asthma    Back pain    Cancer (HCC)    skin   Cataract    HPV in female 1996   HPV with colposcopy (all neg paps since)   Hyperlipidemia    Hypertension    Incarcerated ventral hernia     Obesity    Osteopenia    Pre-diabetes    Shortness of breath dyspnea    Shoulder pain    Past Surgical History:  Procedure Laterality Date   CATARACT EXTRACTION, BILATERAL Bilateral    COLONOSCOPY  06/01/2004   COLONOSCOPY WITH PROPOFOL  N/A 05/01/2015   Procedure: COLONOSCOPY WITH PROPOFOL ;  Surgeon: Lamar ONEIDA Holmes, MD;  Location: [facility] ENDOSCOPY;  Service: Endoscopy;  Laterality: N/A;   CORRECTION HAMMER TOE Right    2nd toe   JOINT REPLACEMENT Right    right knee replacement January 2021   SQUAMOUS CELL CARCINOMA EXCISION Left 12/2017   left arm   TONSILLECTOMY     VAGINAL HYSTERECTOMY  2015   with pelvic organ prolapse surgery    XI ROBOTIC ASSISTED VENTRAL HERNIA N/A 06/10/2021   Procedure: XI ROBOTIC ASSISTED VENTRAL HERNIA;  Surgeon: Rodolph Romano, MD;  Location: [facility] ORS;  Service: General;  Laterality: N/A;   Social History[1] Family History  Problem Relation Age of Onset   Diabetes Mother    Hyperlipidemia Mother    Heart disease Mother    Hypertension Mother    Obesity Mother    Kyphosis Mother    Heart disease Father    Hypertension Father    Cancer Father        lung Cancer smoker  Diabetes Daughter    Kyphosis Sister    Breast cancer Neg Hx    Allergies[2] Medications Ordered Prior to Encounter[3]  Review of Systems  Constitutional:  Negative for fever.  HENT:  Positive for hearing loss. Negative for ear discharge, ear pain and facial swelling.        Ear fullness/ mild decreased hearing on the left   No ear pain        Objective:   Physical Exam HENT:     Right Ear: Tympanic membrane, ear canal and external ear normal.     Left Ear: There is impacted cerumen.     Ears:     Comments: Scant soft cerumen at entrance to canal of right ear   Soft cerumen impaction of left ear canal  Decreased sens to quiet sound on that side    Procedure: Cerumen Disimpaction After consent obtained  Warm water was applied and gentle ear lavage  performed on L ear.    This was unsuccessful in removing all of the cerumen   Pt tolerated well    Skin:    General: Skin is warm and dry.  Neurological:     Cranial Nerves: No cranial nerve deficit.  Psychiatric:        Mood and Affect: Mood normal.           Assessment & Plan:   Problem List Items Addressed This Visit       Nervous and Auditory   Cerumen impaction - Primary   Attempted left ear irrigation today w/o success  Reduced hearing No pain or other symptoms   Right ear canal-scant cerumen Left ear canal - full  Referral made to ENT to treat this /remove cerumen  Pt end to continue debrox in left ear (in the future on schedule bilaterally to prevent impaction)   Call back and Er precautions noted in detail today        Relevant Orders   Ambulatory referral to ENT       [1]  Social History Tobacco Use   Smoking status: Never   Smokeless tobacco: Never  Vaping Use   Vaping status: Never Used  Substance Use Topics   Alcohol use: No    Alcohol/week: 0.0 standard drinks of alcohol   Drug use: No  [2]  Allergies Allergen Reactions   Atorvastatin      Headache    Atorvastatin  Calcium  Other (See Comments)    atorvastatin  calcium    Solifenacin Succinate Other (See Comments)    solifenacin   Tolterodine Tartrate Other (See Comments)    tolterodine   Zetia  [Ezetimibe ]     Headache   [3]  Current Outpatient Medications on File Prior to Visit  Medication Sig Dispense Refill   albuterol  (VENTOLIN  HFA) 108 (90 Base) MCG/ACT inhaler INHALE TWO PUFFS BY MOUTH EVERY 4 HOURS AS NEEDED FOR WHEEZING AND 2 PUFFS BEFORE EXERCISE OR EXPOSURE TO COLD AIR 18 each 3   amLODipine  (NORVASC ) 5 MG tablet Take 1 tablet (5 mg total) by mouth daily. 90 tablet 3   aspirin  EC 81 MG tablet Take 1 tablet (81 mg total) by mouth daily. Swallow whole.     b complex vitamins tablet Take 1 tablet by mouth every Monday, Wednesday, and Friday.     bacitracin  500 UNIT/GM  ointment Apply 1 Application topically 2 (two) times daily. To affected blister 15 g 0   celecoxib (CELEBREX) 100 MG capsule Take 1 capsule by mouth daily as needed.  Cholecalciferol (VITAMIN D3 PO) Take 2 tablets by mouth daily.     Hyaluronic Acid-Vitamin C (HYALURONIC ACID PO) Take 1 tablet by mouth every Monday, Wednesday, and Friday.     HYDROcodone -acetaminophen  (NORCO) 10-325 MG tablet Take 1 tablet by mouth every 6 (six) hours as needed. 30 tablet 0   meloxicam (MOBIC) 7.5 MG tablet Take 1 tablet by mouth daily.     montelukast  (SINGULAIR ) 10 MG tablet Take 1 tablet (10 mg total) by mouth at bedtime. 90 tablet 3   Multiple Vitamin (MULTIVITAMIN) capsule Take 1 capsule by mouth daily. Reported on 12/18/2015     Omega-3 Fatty Acids (FISH OIL PO) Take 1 capsule by mouth daily. Reported on 12/18/2015     OVER THE COUNTER MEDICATION Take 2 capsules by mouth daily. BONE UP     oxybutynin  (OXYTROL ) 3.9 MG/24HR Place 1 patch onto the skin once a week. Sunday.  Home med.     Probiotic Product (PROBIOTIC DAILY PO) Take 1 tablet by mouth daily.     Pumpkin Seed-Soy Germ (AZO BLADDER CONTROL/GO-LESS PO) Take 1 capsule by mouth at bedtime.     rosuvastatin  (CRESTOR ) 20 MG tablet Take 1 tablet (20 mg total) by mouth every other day. Takes 3 days per week 45 tablet 3   simvastatin  (ZOCOR ) 40 MG tablet Take 1 tablet (40 mg total) by mouth every other day. 45 tablet 3   triamcinolone  cream (KENALOG ) 0.1 % Apply 1 Application topically 2 (two) times daily. To affected (rash) area 30 g 0   TURMERIC PO Take 720 mg by mouth every Monday, Wednesday, and Friday.     Ubiquinol 100 MG CAPS Take 1 capsule by mouth every Monday, Wednesday, and Friday.     No current facility-administered medications on file prior to visit.   "

## 2024-08-16 NOTE — Assessment & Plan Note (Addendum)
 Attempted left ear irrigation today w/o success  Reduced hearing No pain or other symptoms   Right ear canal-scant cerumen Left ear canal - full  Referral made to ENT to treat this /remove cerumen  Pt end to continue debrox in left ear (in the future on schedule bilaterally to prevent impaction)   Call back and Er precautions noted in detail today

## 2024-08-22 ENCOUNTER — Ambulatory Visit

## 2024-08-22 VITALS — BP 128/76 | HR 71 | Ht 64.0 in | Wt 205.0 lb

## 2024-08-22 DIAGNOSIS — M25562 Pain in left knee: Secondary | ICD-10-CM

## 2024-08-22 DIAGNOSIS — S42202D Unspecified fracture of upper end of left humerus, subsequent encounter for fracture with routine healing: Secondary | ICD-10-CM

## 2024-08-22 DIAGNOSIS — M1712 Unilateral primary osteoarthritis, left knee: Secondary | ICD-10-CM

## 2024-08-22 NOTE — Progress Notes (Signed)
 "  Office Visit Note   Patient: Cheryl Joseph           Date of Birth: 07-28-1940           MRN: [id number] Visit Date: 08/22/2024              Requested by: Tower, Laine LABOR, MD [address] [place],  [place] [address] PCP: Randeen Laine LABOR, MD   Assessment & Plan: Visit Diagnoses:  1. Closed fracture of proximal end of left humerus with routine healing, unspecified fracture morphology, subsequent encounter   2. Acute pain of left knee   3. Unilateral primary osteoarthritis, left knee     Plan: For the shoulder, patient will continue PT for ROM exercises and will follow up if not happy with progression. Did advise her that she may always have some loss of motion as her shoulder did not heal anatomically. For the knee, recommended rest and ice, tylenol  for pain, and PT.  Orders:  Orders Placed This Encounter  Procedures   DG Shoulder Left   DG Knee 3 Views Left   Ambulatory referral to Physical Therapy     Subjective: Chief Complaint: Left shoulder pain, left knee instability  HPI Patient is a 84 y.o. female who presents for a follow up appointment for the left shoulder. Patient's symptoms are gradually improving since last visit. Shoulder pain is at night. Treatment to date: PT, which is helping. Patient also now complains of the left knee. Reports that the knee feels like it will give out at times. Denies pain.  Objective: Vital Signs: BP 128/76   Pulse 71   Ht 5' 4 (1.626 m)   Wt 93 kg   BMI 35.19 kg/m   Physical Exam Gen: Alert, No Acute Distress left shoulder: Skin intact, no erythema or induration noted. Flexion to 90, IR to L1, ER to 60. 5/5 abduction/IR/ER. Negative empty can signs Left knee: Skin intact. No erythema or induration. ROM 0-125. NTTP. + patellar grind test  Radiographs: X rays of left shoulder reveal healed left proximal humerus fracture. X rays of the left knee reveal severe patellofemoral arthritis of the left knee.  PMFS  History: Patient Active Problem List   Diagnosis Date Noted   Abnormal finding on urinalysis 08/02/2024   Low back pain 08/02/2024   Diarrhea 01/31/2024   Rash of neck 10/11/2023   Pelvic prolapse 07/22/2023   Pain of right thumb 02/16/2023   Encounter for screening mammogram for breast cancer 12/28/2022   Cerumen impaction 12/28/2022   Chronic headache 02/25/2022   Snoring 02/25/2022   History of stroke 12/31/2021   Umbilical hernia 01/05/2021   Pre-operative general physical examination 07/06/2019   Estrogen deficiency 05/25/2016   Routine general medical examination at a health care facility 11/23/2015   Hammertoe 10/28/2015   Diverticulosis of colon without hemorrhage 02/14/2015   Colon cancer screening 08/19/2014   Essential hypertension 02/25/2014   Encounter for Medicare annual wellness exam 07/16/2013   Prediabetes 05/14/2009   Osteopenia 03/07/2007   Hyperlipidemia 01/03/2007   Obesity (BMI 30-39.9) 01/03/2007   ALLERGIC RHINITIS, SEASONAL 01/03/2007   Asthma 01/03/2007   OVERACTIVE BLADDER 01/03/2007   Past Medical History:  Diagnosis Date   Anginal pain    Arthritis    RA hands(seronegative)   Asthma    Back pain    Cancer (HCC)    skin   Cataract    HPV in female 1996   HPV with colposcopy (all neg  paps since)   Hyperlipidemia    Hypertension    Incarcerated ventral hernia    Obesity    Osteopenia    Pre-diabetes    Shortness of breath dyspnea    Shoulder pain     Family History  Problem Relation Age of Onset   Diabetes Mother    Hyperlipidemia Mother    Heart disease Mother    Hypertension Mother    Obesity Mother    Kyphosis Mother    Heart disease Father    Hypertension Father    Cancer Father        lung Cancer smoker   Diabetes Daughter    Kyphosis Sister    Breast cancer Neg Hx     Past Surgical History:  Procedure Laterality Date   CATARACT EXTRACTION, BILATERAL Bilateral    COLONOSCOPY  06/01/2004   COLONOSCOPY WITH PROPOFOL   N/A 05/01/2015   Procedure: COLONOSCOPY WITH PROPOFOL ;  Surgeon: Lamar ONEIDA Holmes, MD;  Location: [facility] ENDOSCOPY;  Service: Endoscopy;  Laterality: N/A;   CORRECTION HAMMER TOE Right    2nd toe   JOINT REPLACEMENT Right    right knee replacement January 2021   SQUAMOUS CELL CARCINOMA EXCISION Left 12/2017   left arm   TONSILLECTOMY     VAGINAL HYSTERECTOMY  2015   with pelvic organ prolapse surgery    XI ROBOTIC ASSISTED VENTRAL HERNIA N/A 06/10/2021   Procedure: XI ROBOTIC ASSISTED VENTRAL HERNIA;  Surgeon: Rodolph Romano, MD;  Location: [facility] ORS;  Service: General;  Laterality: N/A;   Social History   Occupational History   Not on file  Tobacco Use   Smoking status: Never   Smokeless tobacco: Never  Vaping Use   Vaping status: Never Used  Substance and Sexual Activity   Alcohol use: No    Alcohol/week: 0.0 standard drinks of alcohol   Drug use: No   Sexual activity: Yes   Current Outpatient Medications  Medication Instructions   albuterol  (VENTOLIN  HFA) 108 (90 Base) MCG/ACT inhaler INHALE TWO PUFFS BY MOUTH EVERY 4 HOURS AS NEEDED FOR WHEEZING AND 2 PUFFS BEFORE EXERCISE OR EXPOSURE TO COLD AIR   amLODipine  (NORVASC ) 5 mg, Oral, Daily   aspirin  EC 81 mg, Oral, Daily, Swallow whole.   b complex vitamins tablet 1 tablet, Every M-W-F   bacitracin  500 UNIT/GM ointment 1 Application, Topical, 2 times daily, To affected blister   celecoxib (CELEBREX) 100 MG capsule 1 capsule, Daily PRN   Cholecalciferol (VITAMIN D3 PO) 2 tablets, Daily   Hyaluronic Acid-Vitamin C (HYALURONIC ACID PO) 1 tablet, Every M-W-F   HYDROcodone -acetaminophen  (NORCO) 10-325 MG tablet 1 tablet, Oral, Every 6 hours PRN   meloxicam (MOBIC) 7.5 MG tablet 1 tablet, Daily   montelukast  (SINGULAIR ) 10 mg, Oral, Daily at bedtime   Multiple Vitamin (MULTIVITAMIN) capsule 1 capsule, Daily   Omega-3 Fatty Acids (FISH OIL PO) 1 capsule, Daily   OVER THE COUNTER MEDICATION 2 capsules, Daily   oxybutynin   (OXYTROL ) 3.9 MG/24HR 1 patch, Transdermal, Weekly, Sunday.  Home med.   Probiotic Product (PROBIOTIC DAILY PO) 1 tablet, Daily   Pumpkin Seed-Soy Germ (AZO BLADDER CONTROL/GO-LESS PO) 1 capsule, Daily at bedtime   rosuvastatin  (CRESTOR ) 20 mg, Oral, Every other day, Takes 3 days per week   simvastatin  (ZOCOR ) 40 mg, Oral, Every other day   triamcinolone  cream (KENALOG ) 0.1 % 1 Application, Topical, 2 times daily, To affected (rash) area   TURMERIC PO 720 mg, Every M-W-F   Ubiquinol 100 MG CAPS  1 capsule, Every M-W-F   Allergies as of 08/22/2024 - Review Complete 08/16/2024  Allergen Reaction Noted   Atorvastatin   03/26/2022   Atorvastatin  calcium  Other (See Comments) 03/26/2022   Solifenacin succinate Other (See Comments) 07/25/2014   Tolterodine tartrate Other (See Comments) 01/03/2007   Zetia  [ezetimibe ]  03/26/2022   "

## 2024-10-29 ENCOUNTER — Ambulatory Visit

## 2024-11-12 ENCOUNTER — Other Ambulatory Visit

## 2024-11-19 ENCOUNTER — Encounter: Admitting: Family Medicine
